# Patient Record
Sex: Female | Born: 1940
Health system: Southern US, Community
[De-identification: ages and names within clinical notes are randomized; demographics above are authoritative.]

## PROBLEM LIST (undated history)

## (undated) DIAGNOSIS — I10 Essential (primary) hypertension: Secondary | ICD-10-CM

## (undated) DIAGNOSIS — E785 Hyperlipidemia, unspecified: Secondary | ICD-10-CM

## (undated) DIAGNOSIS — M1990 Unspecified osteoarthritis, unspecified site: Secondary | ICD-10-CM

## (undated) DIAGNOSIS — J45909 Unspecified asthma, uncomplicated: Secondary | ICD-10-CM

## (undated) DIAGNOSIS — K08109 Complete loss of teeth, unspecified cause, unspecified class: Secondary | ICD-10-CM

## (undated) DIAGNOSIS — Z972 Presence of dental prosthetic device (complete) (partial): Secondary | ICD-10-CM

## (undated) DIAGNOSIS — Z973 Presence of spectacles and contact lenses: Secondary | ICD-10-CM

## (undated) DIAGNOSIS — T4145XA Adverse effect of unspecified anesthetic, initial encounter: Secondary | ICD-10-CM

## (undated) DIAGNOSIS — K589 Irritable bowel syndrome without diarrhea: Secondary | ICD-10-CM

## (undated) DIAGNOSIS — I2510 Atherosclerotic heart disease of native coronary artery without angina pectoris: Secondary | ICD-10-CM

## (undated) DIAGNOSIS — K5792 Diverticulitis of intestine, part unspecified, without perforation or abscess without bleeding: Secondary | ICD-10-CM

## (undated) DIAGNOSIS — K219 Gastro-esophageal reflux disease without esophagitis: Secondary | ICD-10-CM

## (undated) DIAGNOSIS — M8580 Other specified disorders of bone density and structure, unspecified site: Secondary | ICD-10-CM

## (undated) DIAGNOSIS — T8859XA Other complications of anesthesia, initial encounter: Secondary | ICD-10-CM

## (undated) DIAGNOSIS — F419 Anxiety disorder, unspecified: Secondary | ICD-10-CM

## (undated) HISTORY — DX: Gastro-esophageal reflux disease without esophagitis: K21.9

## (undated) HISTORY — PX: CHOLECYSTECTOMY: SHX55

## (undated) HISTORY — PX: EYE SURGERY: SHX253

## (undated) HISTORY — PX: ABDOMINAL HYSTERECTOMY: SHX81

## (undated) HISTORY — PX: CARPAL TUNNEL RELEASE: SHX101

## (undated) HISTORY — DX: Unspecified osteoarthritis, unspecified site: M19.90

## (undated) HISTORY — DX: Other specified disorders of bone density and structure, unspecified site: M85.80

## (undated) HISTORY — DX: Hyperlipidemia, unspecified: E78.5

## (undated) HISTORY — PX: APPENDECTOMY: SHX54

## (undated) HISTORY — DX: Anxiety disorder, unspecified: F41.9

## (undated) HISTORY — DX: Diverticulitis of intestine, part unspecified, without perforation or abscess without bleeding: K57.92

## (undated) HISTORY — PX: COLONOSCOPY: SHX174

## (undated) HISTORY — DX: Atherosclerotic heart disease of native coronary artery without angina pectoris: I25.10

---

## 1988-08-20 HISTORY — PX: KNEE ARTHROSCOPY: SUR90

## 1998-08-20 HISTORY — PX: JOINT REPLACEMENT: SHX530

## 1998-12-14 ENCOUNTER — Other Ambulatory Visit: Admission: RE | Admit: 1998-12-14 | Discharge: 1998-12-14 | Payer: Self-pay

## 1999-05-10 ENCOUNTER — Encounter: Payer: Self-pay | Admitting: Orthopedic Surgery

## 1999-05-17 ENCOUNTER — Inpatient Hospital Stay (HOSPITAL_COMMUNITY): Admission: RE | Admit: 1999-05-17 | Discharge: 1999-05-22 | Payer: Self-pay | Admitting: Orthopedic Surgery

## 2002-04-03 ENCOUNTER — Encounter: Payer: Self-pay | Admitting: Family Medicine

## 2002-04-03 ENCOUNTER — Ambulatory Visit (HOSPITAL_COMMUNITY): Admission: RE | Admit: 2002-04-03 | Discharge: 2002-04-03 | Payer: Self-pay | Admitting: Family Medicine

## 2002-04-08 ENCOUNTER — Encounter: Payer: Self-pay | Admitting: Family Medicine

## 2002-04-08 ENCOUNTER — Ambulatory Visit (HOSPITAL_COMMUNITY): Admission: RE | Admit: 2002-04-08 | Discharge: 2002-04-08 | Payer: Self-pay | Admitting: *Deleted

## 2002-04-09 ENCOUNTER — Observation Stay (HOSPITAL_COMMUNITY): Admission: RE | Admit: 2002-04-09 | Discharge: 2002-04-10 | Payer: Self-pay | Admitting: General Surgery

## 2002-04-09 ENCOUNTER — Encounter: Payer: Self-pay | Admitting: General Surgery

## 2002-04-09 ENCOUNTER — Encounter (INDEPENDENT_AMBULATORY_CARE_PROVIDER_SITE_OTHER): Payer: Self-pay | Admitting: Specialist

## 2002-04-30 ENCOUNTER — Ambulatory Visit (HOSPITAL_COMMUNITY): Admission: RE | Admit: 2002-04-30 | Discharge: 2002-04-30 | Payer: Self-pay | Admitting: General Surgery

## 2002-04-30 ENCOUNTER — Encounter: Payer: Self-pay | Admitting: General Surgery

## 2003-07-06 ENCOUNTER — Other Ambulatory Visit: Admission: RE | Admit: 2003-07-06 | Discharge: 2003-07-06 | Payer: Self-pay | Admitting: Family Medicine

## 2003-07-06 ENCOUNTER — Encounter: Payer: Self-pay | Admitting: Family Medicine

## 2003-07-06 LAB — CONVERTED CEMR LAB: Pap Smear: NORMAL

## 2003-07-21 ENCOUNTER — Encounter: Admission: RE | Admit: 2003-07-21 | Discharge: 2003-07-21 | Payer: Self-pay | Admitting: Family Medicine

## 2004-03-16 ENCOUNTER — Encounter: Admission: RE | Admit: 2004-03-16 | Discharge: 2004-03-16 | Payer: Self-pay | Admitting: Family Medicine

## 2004-04-11 ENCOUNTER — Encounter: Admission: RE | Admit: 2004-04-11 | Discharge: 2004-04-11 | Payer: Self-pay | Admitting: Family Medicine

## 2004-07-19 ENCOUNTER — Ambulatory Visit: Payer: Self-pay | Admitting: Family Medicine

## 2004-08-09 ENCOUNTER — Ambulatory Visit: Payer: Self-pay | Admitting: Family Medicine

## 2004-09-07 ENCOUNTER — Ambulatory Visit: Payer: Self-pay | Admitting: Family Medicine

## 2004-09-13 ENCOUNTER — Ambulatory Visit: Payer: Self-pay | Admitting: Family Medicine

## 2004-09-20 LAB — HM DEXA SCAN

## 2004-09-26 ENCOUNTER — Ambulatory Visit: Payer: Self-pay | Admitting: Family Medicine

## 2004-10-02 ENCOUNTER — Encounter: Admission: RE | Admit: 2004-10-02 | Discharge: 2004-10-02 | Payer: Self-pay | Admitting: Family Medicine

## 2004-11-01 ENCOUNTER — Ambulatory Visit: Payer: Self-pay | Admitting: Family Medicine

## 2004-12-25 ENCOUNTER — Ambulatory Visit: Payer: Self-pay | Admitting: Family Medicine

## 2005-03-28 ENCOUNTER — Ambulatory Visit: Payer: Self-pay | Admitting: Family Medicine

## 2005-05-07 ENCOUNTER — Ambulatory Visit: Payer: Self-pay | Admitting: Family Medicine

## 2005-06-05 ENCOUNTER — Ambulatory Visit: Payer: Self-pay | Admitting: Family Medicine

## 2005-06-28 ENCOUNTER — Ambulatory Visit: Payer: Self-pay | Admitting: Family Medicine

## 2005-08-29 ENCOUNTER — Ambulatory Visit: Payer: Self-pay | Admitting: Family Medicine

## 2005-09-20 ENCOUNTER — Ambulatory Visit: Payer: Self-pay | Admitting: Family Medicine

## 2005-10-04 ENCOUNTER — Encounter: Admission: RE | Admit: 2005-10-04 | Discharge: 2005-10-04 | Payer: Self-pay | Admitting: Family Medicine

## 2005-11-06 ENCOUNTER — Ambulatory Visit: Payer: Self-pay | Admitting: Family Medicine

## 2006-02-12 ENCOUNTER — Ambulatory Visit: Payer: Self-pay | Admitting: Family Medicine

## 2006-07-09 ENCOUNTER — Ambulatory Visit: Payer: Self-pay | Admitting: Family Medicine

## 2006-07-31 ENCOUNTER — Ambulatory Visit: Payer: Self-pay | Admitting: Family Medicine

## 2006-09-13 ENCOUNTER — Ambulatory Visit: Payer: Self-pay | Admitting: Family Medicine

## 2006-09-13 LAB — CONVERTED CEMR LAB
Basophils Relative: 1 % (ref 0.0–1.0)
CO2: 30 meq/L (ref 19–32)
Calcium: 9.6 mg/dL (ref 8.4–10.5)
Chloride: 105 meq/L (ref 96–112)
Cholesterol: 209 mg/dL (ref 0–200)
Creatinine, Ser: 0.8 mg/dL (ref 0.4–1.2)
Direct LDL: 130.2 mg/dL
Eosinophils Absolute: 0.3 10*3/uL (ref 0.0–0.6)
Eosinophils Relative: 3.2 % (ref 0.0–5.0)
GFR calc Af Amer: 93 mL/min
GFR calc non Af Amer: 77 mL/min
HCT: 44.8 % (ref 36.0–46.0)
Hgb A1c MFr Bld: 6.4 % — ABNORMAL HIGH (ref 4.6–6.0)
Lymphocytes Relative: 21.4 % (ref 12.0–46.0)
MCV: 91.6 fL (ref 78.0–100.0)
Monocytes Relative: 6.1 % (ref 3.0–11.0)
Neutro Abs: 6.3 10*3/uL (ref 1.4–7.7)
Phosphorus: 3.8 mg/dL (ref 2.3–4.6)
Total CHOL/HDL Ratio: 5.9
Triglycerides: 264 mg/dL (ref 0–149)
VLDL: 53 mg/dL — ABNORMAL HIGH (ref 0–40)

## 2006-10-09 ENCOUNTER — Ambulatory Visit: Payer: Self-pay | Admitting: Family Medicine

## 2006-10-23 ENCOUNTER — Ambulatory Visit: Payer: Self-pay | Admitting: Family Medicine

## 2006-11-12 ENCOUNTER — Encounter: Admission: RE | Admit: 2006-11-12 | Discharge: 2006-11-12 | Payer: Self-pay | Admitting: Family Medicine

## 2006-11-19 LAB — HM COLONOSCOPY

## 2006-12-02 ENCOUNTER — Ambulatory Visit: Payer: Self-pay | Admitting: Internal Medicine

## 2006-12-12 ENCOUNTER — Ambulatory Visit: Payer: Self-pay | Admitting: Internal Medicine

## 2007-01-31 ENCOUNTER — Encounter: Payer: Self-pay | Admitting: Family Medicine

## 2007-01-31 DIAGNOSIS — F419 Anxiety disorder, unspecified: Secondary | ICD-10-CM

## 2007-01-31 DIAGNOSIS — Z8719 Personal history of other diseases of the digestive system: Secondary | ICD-10-CM

## 2007-01-31 DIAGNOSIS — E785 Hyperlipidemia, unspecified: Secondary | ICD-10-CM

## 2007-01-31 DIAGNOSIS — M1990 Unspecified osteoarthritis, unspecified site: Secondary | ICD-10-CM | POA: Insufficient documentation

## 2007-02-05 ENCOUNTER — Ambulatory Visit: Payer: Self-pay | Admitting: Family Medicine

## 2007-02-05 DIAGNOSIS — Z87891 Personal history of nicotine dependence: Secondary | ICD-10-CM

## 2007-02-05 DIAGNOSIS — I10 Essential (primary) hypertension: Secondary | ICD-10-CM

## 2007-02-05 LAB — CONVERTED CEMR LAB
Bilirubin Urine: NEGATIVE
Ketones, urine, test strip: NEGATIVE
Nitrite: NEGATIVE
Specific Gravity, Urine: 1.015
Urobilinogen, UA: 0.2

## 2007-02-07 LAB — CONVERTED CEMR LAB
CO2: 30 meq/L (ref 19–32)
Cholesterol: 224 mg/dL (ref 0–200)
Creatinine, Ser: 0.8 mg/dL (ref 0.4–1.2)
GFR calc Af Amer: 93 mL/min
GFR calc non Af Amer: 77 mL/min
Glucose, Bld: 137 mg/dL — ABNORMAL HIGH (ref 70–99)
HDL: 32.4 mg/dL — ABNORMAL LOW (ref >39.0)
Sodium: 143 meq/L (ref 135–145)
Total CHOL/HDL Ratio: 6.9
Triglycerides: 271 mg/dL (ref 0–149)
VLDL: 54 mg/dL — ABNORMAL HIGH (ref 0–40)

## 2007-02-24 ENCOUNTER — Telehealth: Payer: Self-pay | Admitting: Family Medicine

## 2007-07-18 ENCOUNTER — Ambulatory Visit: Payer: Self-pay | Admitting: Family Medicine

## 2007-09-18 ENCOUNTER — Ambulatory Visit: Payer: Self-pay | Admitting: Family Medicine

## 2007-09-18 LAB — CONVERTED CEMR LAB
Glucose, Urine, Semiquant: NEGATIVE
Nitrite: NEGATIVE
Protein, U semiquant: NEGATIVE

## 2007-09-19 ENCOUNTER — Encounter: Payer: Self-pay | Admitting: Family Medicine

## 2007-10-03 ENCOUNTER — Ambulatory Visit: Payer: Self-pay | Admitting: Family Medicine

## 2007-10-06 LAB — CONVERTED CEMR LAB
Albumin: 3.8 g/dL (ref 3.5–5.2)
Chloride: 103 meq/L (ref 96–112)
Creatinine, Ser: 0.8 mg/dL (ref 0.4–1.2)
Direct LDL: 116 mg/dL
GFR calc non Af Amer: 76 mL/min
HDL: 32.8 mg/dL — ABNORMAL LOW (ref >39.0)
Hgb A1c MFr Bld: 6.9 % — ABNORMAL HIGH (ref 4.6–6.0)
Phosphorus: 3.8 mg/dL (ref 2.3–4.6)
Potassium: 3.9 meq/L (ref 3.5–5.1)
Sodium: 140 meq/L (ref 135–145)
Total CHOL/HDL Ratio: 6.2
Triglycerides: 296 mg/dL (ref 0–149)

## 2007-12-05 ENCOUNTER — Ambulatory Visit: Payer: Self-pay | Admitting: Family Medicine

## 2007-12-05 DIAGNOSIS — J309 Allergic rhinitis, unspecified: Secondary | ICD-10-CM

## 2008-04-20 ENCOUNTER — Ambulatory Visit: Payer: Self-pay | Admitting: Family Medicine

## 2008-04-23 LAB — CONVERTED CEMR LAB
ALT: 29 units/L (ref 0–35)
Calcium: 9.2 mg/dL (ref 8.4–10.5)
Cholesterol: 228 mg/dL (ref 0–200)
Creatinine, Ser: 0.7 mg/dL (ref 0.4–1.2)
GFR calc Af Amer: 108 mL/min
GFR calc non Af Amer: 89 mL/min
HDL: 30.2 mg/dL — ABNORMAL LOW (ref >39.0)
Triglycerides: 222 mg/dL (ref 0–149)
VLDL: 44 mg/dL — ABNORMAL HIGH (ref 0–40)

## 2008-05-04 ENCOUNTER — Ambulatory Visit: Payer: Self-pay | Admitting: Family Medicine

## 2008-05-05 LAB — CONVERTED CEMR LAB
Eosinophils Absolute: 0.1 10*3/uL (ref 0.0–0.7)
HCT: 44.9 % (ref 36.0–46.0)
Lymphocytes Relative: 14.7 % (ref 12.0–46.0)
MCV: 91.3 fL (ref 78.0–100.0)
Monocytes Absolute: 0.5 10*3/uL (ref 0.1–1.0)
Monocytes Relative: 5.3 % (ref 3.0–12.0)
Platelets: 245 10*3/uL (ref 150–400)
RDW: 12.1 % (ref 11.5–14.6)

## 2008-05-07 ENCOUNTER — Ambulatory Visit: Payer: Self-pay | Admitting: Family Medicine

## 2008-06-21 ENCOUNTER — Ambulatory Visit: Payer: Self-pay | Admitting: Family Medicine

## 2008-06-24 ENCOUNTER — Encounter: Payer: Self-pay | Admitting: Family Medicine

## 2008-06-29 ENCOUNTER — Telehealth: Payer: Self-pay | Admitting: Family Medicine

## 2008-08-06 ENCOUNTER — Ambulatory Visit: Payer: Self-pay | Admitting: Internal Medicine

## 2008-08-06 ENCOUNTER — Ambulatory Visit: Payer: Self-pay | Admitting: Family Medicine

## 2008-08-06 LAB — CONVERTED CEMR LAB
Bilirubin Urine: NEGATIVE
Epithelial cells, urine: 0 /lpf
Nitrite: NEGATIVE
Urobilinogen, UA: 0.2

## 2008-08-10 LAB — CONVERTED CEMR LAB
Albumin: 3.8 g/dL (ref 3.5–5.2)
CO2: 30 meq/L (ref 19–32)
Chloride: 103 meq/L (ref 96–112)
Creatinine, Ser: 0.8 mg/dL (ref 0.4–1.2)
GFR calc non Af Amer: 76 mL/min
Glucose, Bld: 171 mg/dL — ABNORMAL HIGH (ref 70–99)
Potassium: 3.8 meq/L (ref 3.5–5.1)
Sodium: 139 meq/L (ref 135–145)

## 2008-08-24 ENCOUNTER — Ambulatory Visit: Payer: Self-pay | Admitting: Family Medicine

## 2008-08-24 LAB — CONVERTED CEMR LAB
Glucose, Urine, Semiquant: NEGATIVE
Ketones, urine, test strip: NEGATIVE
Nitrite: NEGATIVE
Protein, U semiquant: NEGATIVE
Specific Gravity, Urine: 1.01
Urobilinogen, UA: 0.2
Yeast, UA: 0

## 2008-08-25 ENCOUNTER — Encounter: Payer: Self-pay | Admitting: Family Medicine

## 2008-09-07 ENCOUNTER — Ambulatory Visit: Payer: Self-pay | Admitting: Family Medicine

## 2008-09-07 LAB — CONVERTED CEMR LAB
Bilirubin Urine: NEGATIVE
Casts: 0 /lpf
Glucose, Urine, Semiquant: NEGATIVE
Ketones, urine, test strip: NEGATIVE
Protein, U semiquant: NEGATIVE
WBC, UA: 1 cells/hpf
Yeast, UA: 0

## 2008-09-08 ENCOUNTER — Encounter: Payer: Self-pay | Admitting: Family Medicine

## 2008-09-08 ENCOUNTER — Ambulatory Visit: Payer: Self-pay | Admitting: Internal Medicine

## 2008-09-09 ENCOUNTER — Encounter: Payer: Self-pay | Admitting: Family Medicine

## 2008-09-10 ENCOUNTER — Telehealth: Payer: Self-pay | Admitting: Family Medicine

## 2008-09-10 ENCOUNTER — Encounter: Payer: Self-pay | Admitting: Family Medicine

## 2008-09-17 ENCOUNTER — Encounter: Payer: Self-pay | Admitting: Family Medicine

## 2008-12-30 ENCOUNTER — Ambulatory Visit: Payer: Self-pay | Admitting: Family Medicine

## 2009-01-02 LAB — CONVERTED CEMR LAB
AST: 30 units/L (ref 0–37)
BUN: 9 mg/dL (ref 6–23)
CO2: 30 meq/L (ref 19–32)
Cholesterol: 205 mg/dL — ABNORMAL HIGH (ref 0–200)
Creatinine, Ser: 0.8 mg/dL (ref 0.4–1.2)
Creatinine,U: 165.5 mg/dL
Glucose, Bld: 103 mg/dL — ABNORMAL HIGH (ref 70–99)
HDL: 31.8 mg/dL — ABNORMAL LOW (ref >39.00)
Hgb A1c MFr Bld: 6.2 % (ref 4.6–6.5)
Microalb Creat Ratio: 4.2 mg/g (ref 0.0–30.0)
Microalb, Ur: 0.7 mg/dL (ref 0.0–1.9)
Phosphorus: 3.7 mg/dL (ref 2.3–4.6)
Potassium: 3.7 meq/L (ref 3.5–5.1)

## 2009-01-06 ENCOUNTER — Ambulatory Visit: Payer: Self-pay | Admitting: Family Medicine

## 2009-01-19 ENCOUNTER — Telehealth: Payer: Self-pay | Admitting: Family Medicine

## 2009-03-09 ENCOUNTER — Ambulatory Visit: Payer: Self-pay | Admitting: Family Medicine

## 2009-03-14 LAB — CONVERTED CEMR LAB
HDL: 34.5 mg/dL — ABNORMAL LOW (ref >39.00)
Total CHOL/HDL Ratio: 6

## 2009-06-17 ENCOUNTER — Ambulatory Visit: Payer: Self-pay | Admitting: Family Medicine

## 2009-06-20 LAB — CONVERTED CEMR LAB
Glucose, Bld: 96 mg/dL (ref 70–99)
HDL: 34.7 mg/dL — ABNORMAL LOW (ref >39.00)
Total CHOL/HDL Ratio: 6
Triglycerides: 179 mg/dL — ABNORMAL HIGH (ref 0.0–149.0)
VLDL: 35.8 mg/dL (ref 0.0–40.0)

## 2009-06-22 ENCOUNTER — Ambulatory Visit: Payer: Self-pay | Admitting: Family Medicine

## 2009-07-12 ENCOUNTER — Telehealth: Payer: Self-pay | Admitting: Family Medicine

## 2009-07-25 ENCOUNTER — Encounter: Payer: Self-pay | Admitting: Family Medicine

## 2009-07-26 ENCOUNTER — Ambulatory Visit: Payer: Self-pay | Admitting: Family Medicine

## 2009-07-28 LAB — CONVERTED CEMR LAB
ALT: 29 units/L (ref 0–35)
Direct LDL: 143.9 mg/dL
HDL: 32.1 mg/dL — ABNORMAL LOW (ref >39.00)
Total CHOL/HDL Ratio: 7
Triglycerides: 231 mg/dL — ABNORMAL HIGH (ref 0.0–149.0)

## 2009-09-05 ENCOUNTER — Ambulatory Visit: Payer: Self-pay | Admitting: Family Medicine

## 2009-09-06 ENCOUNTER — Telehealth: Payer: Self-pay | Admitting: Family Medicine

## 2009-10-07 ENCOUNTER — Telehealth: Payer: Self-pay | Admitting: Family Medicine

## 2009-10-07 ENCOUNTER — Encounter: Payer: Self-pay | Admitting: Family Medicine

## 2009-10-07 LAB — CONVERTED CEMR LAB
Bacteria, UA: 0
Glucose, Urine, Semiquant: NEGATIVE
Ketones, urine, test strip: NEGATIVE
Specific Gravity, Urine: 1.01
Urobilinogen, UA: 0.2
WBC Urine, dipstick: NEGATIVE
Yeast, UA: 0
pH: 6.5

## 2009-10-08 ENCOUNTER — Encounter: Payer: Self-pay | Admitting: Family Medicine

## 2009-10-11 ENCOUNTER — Ambulatory Visit: Payer: Self-pay | Admitting: Family Medicine

## 2009-11-15 ENCOUNTER — Observation Stay (HOSPITAL_COMMUNITY): Admission: EM | Admit: 2009-11-15 | Discharge: 2009-11-17 | Payer: Self-pay | Admitting: Emergency Medicine

## 2009-11-15 ENCOUNTER — Ambulatory Visit: Payer: Self-pay | Admitting: Cardiovascular Disease

## 2009-11-15 ENCOUNTER — Encounter: Payer: Self-pay | Admitting: Family Medicine

## 2009-11-15 ENCOUNTER — Telehealth: Payer: Self-pay | Admitting: Family Medicine

## 2009-11-16 ENCOUNTER — Encounter: Payer: Self-pay | Admitting: Family Medicine

## 2009-11-16 ENCOUNTER — Encounter: Payer: Self-pay | Admitting: Cardiovascular Disease

## 2009-11-17 ENCOUNTER — Encounter: Payer: Self-pay | Admitting: Family Medicine

## 2009-11-23 ENCOUNTER — Ambulatory Visit: Payer: Self-pay | Admitting: Family Medicine

## 2010-01-25 ENCOUNTER — Ambulatory Visit: Payer: Self-pay | Admitting: Family Medicine

## 2010-01-25 LAB — CONVERTED CEMR LAB: LDL Goal: 100 mg/dL

## 2010-01-26 LAB — CONVERTED CEMR LAB
ALT: 22 units/L (ref 0–35)
BUN: 12 mg/dL (ref 6–23)
Cholesterol: 231 mg/dL — ABNORMAL HIGH (ref 0–200)
Direct LDL: 152.7 mg/dL
Glucose, Bld: 106 mg/dL — ABNORMAL HIGH (ref 70–99)
HDL: 39.3 mg/dL (ref >39.00)
Phosphorus: 3.6 mg/dL (ref 2.3–4.6)
Potassium: 4.1 meq/L (ref 3.5–5.1)
Sodium: 141 meq/L (ref 135–145)

## 2010-05-05 ENCOUNTER — Telehealth: Payer: Self-pay | Admitting: Family Medicine

## 2010-05-09 ENCOUNTER — Ambulatory Visit: Payer: Self-pay | Admitting: Internal Medicine

## 2010-05-11 ENCOUNTER — Telehealth: Payer: Self-pay | Admitting: Family Medicine

## 2010-05-19 ENCOUNTER — Ambulatory Visit: Payer: Self-pay | Admitting: Family Medicine

## 2010-06-06 ENCOUNTER — Ambulatory Visit: Payer: Self-pay | Admitting: Family Medicine

## 2010-06-07 ENCOUNTER — Ambulatory Visit: Payer: Self-pay | Admitting: Family Medicine

## 2010-07-11 LAB — HM DIABETES EYE EXAM: HM Diabetic Eye Exam: NORMAL

## 2010-07-18 ENCOUNTER — Encounter: Payer: Self-pay | Admitting: Family Medicine

## 2010-07-25 ENCOUNTER — Telehealth: Payer: Self-pay | Admitting: Family Medicine

## 2010-07-27 ENCOUNTER — Ambulatory Visit: Payer: Self-pay | Admitting: Family Medicine

## 2010-07-30 LAB — CONVERTED CEMR LAB
ALT: 29 units/L (ref 0–35)
AST: 21 units/L (ref 0–37)
Albumin: 4.2 g/dL (ref 3.5–5.2)
BUN: 16 mg/dL (ref 6–23)
CO2: 28 meq/L (ref 19–32)
Chloride: 103 meq/L (ref 96–112)
Creatinine, Ser: 0.9 mg/dL (ref 0.4–1.2)
GFR calc non Af Amer: 62.71 mL/min (ref >60.00)
HDL: 39.3 mg/dL (ref >39.00)
Hgb A1c MFr Bld: 6.2 % (ref 4.6–6.5)
Microalb Creat Ratio: 0.4 mg/g (ref 0.0–30.0)
VLDL: 37 mg/dL (ref 0.0–40.0)

## 2010-07-31 ENCOUNTER — Ambulatory Visit: Payer: Self-pay | Admitting: Family Medicine

## 2010-07-31 DIAGNOSIS — K219 Gastro-esophageal reflux disease without esophagitis: Secondary | ICD-10-CM

## 2010-08-24 ENCOUNTER — Ambulatory Visit
Admission: RE | Admit: 2010-08-24 | Discharge: 2010-08-24 | Payer: Self-pay | Source: Home / Self Care | Attending: Family Medicine | Admitting: Family Medicine

## 2010-08-24 DIAGNOSIS — H6690 Otitis media, unspecified, unspecified ear: Secondary | ICD-10-CM | POA: Insufficient documentation

## 2010-09-19 NOTE — Miscellaneous (Signed)
Summary: Controlled Substance Agreement  Controlled Substance Agreement   Imported By: Lanelle Bal 02/01/2010 09:32:49  _____________________________________________________________________  External Attachment:    Type:   Image     Comment:   External Document

## 2010-09-19 NOTE — Assessment & Plan Note (Signed)
Summary: ear hurts,sore throat,coughing,congestion   Vital Signs:  Patient profile:   70 year old female Height:      61 inches Weight:      163 pounds BMI:     30.91 Temp:     97.7 degrees F oral Pulse rate:   88 / minute Pulse rhythm:   regular BP sitting:   118 / 60  (left arm) Cuff size:   regular  Vitals Entered By: Delilah Shan CMA Duncan Dull) (September 05, 2009 3:31 PM) CC: Ear hurts, ST, cough, congestion   History of Present Illness: 70 yo with almost 2 week of worsening nasal congestion, productive cough, ear pain/popping. No wheezing, CP, or SOB. Mild scratchy throat. This morning, had a terrible frontal headache. No fevers, no chills. No rash. No n/v/d.  Current Medications (verified): 1)  Hydrochlorothiazide 25 Mg  Tabs (Hydrochlorothiazide) .... Take One By Mouth Daily 2)  Calcium + D   Tabs (Calcium-Vitamin D Tabs) .... Take 600 Mg Two By Mouth Daily 3)  Zestril 10 Mg  Tabs (Lisinopril) .... Take One By Mouth Daily 4)  Adult Aspirin Low Strength 81 Mg  Tbdp (Aspirin) .... Take One By Mouth Daily 5)  Glucometer .... To Check Blood Sugar Once Daily and As Needed For Diabetes 250.0 6)  Freestyle Lite Test  Strp (Glucose Blood) .... Check Blood Sugar Once   Daily and As Needed 7)  Glipizide Xl 5 Mg Tb24 (Glipizide) .Marland Kitchen.. 1 Tablet By Mouth Daily 8)  Zocor 20 Mg Tabs (Simvastatin) .Marland Kitchen.. 1 By Mouth Once Daily in Evening 9)  Freestyle Lancets  Misc (Lancets) .... Check Blood Sugar Once Daily and As Needed. 10)  Azithromycin 250 Mg  Tabs (Azithromycin) .... 2 By  Mouth Today and Then 1 Daily For 4 Days  Allergies: 1)  ! Lipitor 2)  ! Zocor (Simvastatin) 3)  Sulfa 4)  Asa 5)  Metformin Hcl  Review of Systems      See HPI General:  Denies chills and fever. ENT:  Complains of earache, postnasal drainage, and sinus pressure; denies difficulty swallowing, ear discharge, and sore throat. Resp:  Complains of cough and sputum productive; denies shortness of breath and  wheezing.  Physical Exam  General:  overweight but generally well appearing  Nose:  mucosal erythema and mucosal edema.   TTP over frontal and maxillary sinuses bilaterally.  Mouth:  pharynx pink and moist.   Lungs:  diffusely distantant bs , without rales/ wheeze or crackles  Heart:  Normal rate and regular rhythm. S1 and S2 normal without gallop, murmur, click, rub or other extra sounds. Extremities:  No clubbing, cyanosis, edema, or deformity noted with normal full range of motion of all joints.   Psych:  normal affect, talkative and pleasant    Impression & Recommendations:  Problem # 1:  OTHER ACUTE SINUSITIS (ICD-461.8) Assessment New  Given duration of symptoms, will treat with abx. Continue supportive care. See patient instructions for details. Her updated medication list for this problem includes:    Azithromycin 250 Mg Tabs (Azithromycin) .Marland Kitchen... 2 by  mouth today and then 1 daily for 4 days  Her updated medication list for this problem includes:    Azithromycin 250 Mg Tabs (Azithromycin) .Marland Kitchen... 2 by  mouth today and then 1 daily for 4 days  Complete Medication List: 1)  Hydrochlorothiazide 25 Mg Tabs (Hydrochlorothiazide) .... Take one by mouth daily 2)  Calcium + D Tabs (Calcium-vitamin d tabs) .... Take 600 mg two by mouth  daily 3)  Zestril 10 Mg Tabs (Lisinopril) .... Take one by mouth daily 4)  Adult Aspirin Low Strength 81 Mg Tbdp (Aspirin) .... Take one by mouth daily 5)  Glucometer  .... To check blood sugar once daily and as needed for diabetes 250.0 6)  Freestyle Lite Test Strp (Glucose blood) .... Check blood sugar once   daily and as needed 7)  Glipizide Xl 5 Mg Tb24 (Glipizide) .Marland Kitchen.. 1 tablet by mouth daily 8)  Zocor 20 Mg Tabs (Simvastatin) .Marland Kitchen.. 1 by mouth once daily in evening 9)  Freestyle Lancets Misc (Lancets) .... Check blood sugar once daily and as needed. 10)  Azithromycin 250 Mg Tabs (Azithromycin) .... 2 by  mouth today and then 1 daily for 4  days  Patient Instructions: 1)  Take antibiotic as directed.  Drink lots of fluids.  Treat sympotmatically with Mucinex, nasal saline irrigation, and Tylenol/Ibuprofen. You can also try using warm compresses.  Cough suppressant at night. Call if not improving as expected in 5-7 days.  Prescriptions: AZITHROMYCIN 250 MG  TABS (AZITHROMYCIN) 2 by  mouth today and then 1 daily for 4 days  #6 x 0   Entered and Authorized by:   Ruthe Mannan MD   Signed by:   Ruthe Mannan MD on 09/05/2009   Method used:   [facility]        [id number]       [address]       [place], [place]  [address]       Ph: [phone]       Fax: [phone]   RxID:   [id number]   Current Allergies (reviewed today): ! LIPITOR ! ZOCOR (SIMVASTATIN) SULFA ASA METFORMIN HCL

## 2010-09-19 NOTE — Assessment & Plan Note (Signed)
Summary: SINUS INFECTION/DLO   Vital Signs:  Patient profile:   70 year old female Weight:      166.25 pounds O2 Sat:      96 % on Room air Temp:     98.2 degrees F oral Pulse rate:   88 / minute Pulse rhythm:   regular BP sitting:   132 / 70  (left arm) Cuff size:   regular  Vitals Entered By: Selena Batten Dance CMA (AAMA) (May 09, 2010 4:01 PM)  O2 Flow:  Room air CC: ? Sinus Infection   History of Present Illness: CC: sinuses, ears, throat, cough  1 wk h/o cough, sneezing, RN, now congestion.  Clear and thick.  cough is thick clear mucous.  + initially with fevers, but improved since beginning.  + ear pain, facial pressure.  Has tried mucinex.  Drinking plenty of fluid.  Gets infection every fall.  Smoking 1/2 ppd.    + sick contact- granddaughter with cold.    h/o DM.  No h/o allergies or asthma.  Husband about to undergo kidney biopsy next week, pt wants to feel better.  Current Medications (verified): 1)  Hydrochlorothiazide 25 Mg  Tabs (Hydrochlorothiazide) .... Take One By Mouth Daily 2)  Calcium + D   Tabs (Calcium-Vitamin D Tabs) .... Take 600 Mg Two By Mouth Daily 3)  Zestril 10 Mg  Tabs (Lisinopril) .... Take One By Mouth Daily 4)  Adult Aspirin Low Strength 81 Mg  Tbdp (Aspirin) .... Take One By Mouth Daily 5)  Glucometer .... To Check Blood Sugar Once Daily and As Needed For Diabetes 250.0 6)  Freestyle Lite Test  Strp (Glucose Blood) .... Check Blood Sugar Once   Daily and As Needed 7)  Glipizide Xl 5 Mg Tb24 (Glipizide) .Marland Kitchen.. 1 Tablet By Mouth Daily 8)  Freestyle Lancets  Misc (Lancets) .... Check Blood Sugar Once Daily and As Needed. 9)  Niacin 500 Mg Tabs (Niacin) .... Take One Tablet By Mouth Nightly 10)  Fish Oil   Oil (Fish Oil) .... Otc As Directed. 11)  Flax   Oil (Flaxseed (Linseed)) .... Otc As Directed. 12)  Alprazolam 0.25 Mg Tabs (Alprazolam) .... Take 1/2 Tablet At Bedtime As Needed 13)  Buspirone Hcl 15 Mg Tabs (Buspirone Hcl) .... 1/2 By Mouth  Two Times A Day For Anxiety  Allergies: 1)  ! Lipitor 2)  ! Zocor (Simvastatin) 3)  Sulfa 4)  Asa 5)  Metformin Hcl  Past History:  Past Medical History: Last updated: 06/29/2008 Anxiety Diabetes mellitus, type II Diverticulitis, hx of Hyperlipidemia Osteoarthritis smoking osteopenia   opthy-- Dr Archie Endo  Review of Systems       per HPI  Physical Exam  General:  overweight, tired appearing Head:  normocephalic, atraumatic, and no abnormalities observed.  Tenderness to palpation bilaterally frontal and maxillary Eyes:  vision grossly intact, pupils equal, pupils round, and pupils reactive to light.  no conjunctival pallor, injection or icterus  Ears:  External ear exam shows no significant lesions or deformities.  Otoscopic examination reveals clear canals, tympanic membranes are intact bilaterally without bulging, retraction, inflammation or discharge. Hearing is grossly normal bilaterally. Nose:  + purulent discharge in nares.   Mouth:  pharynx pink and moist.  no exudates.  + cobblestoning Neck:  supple with full rom and no masses or thyromegally, no JVD or carotid bruit  Lungs:  + coarse breath sounds, wheezing insp and exp throughout, good air movement though.  good pulse ox (96%).  no  crackles/rales Heart:  Normal rate and regular rhythm. S1 and S2 normal without gallop, murmur, click, rub or other extra sounds. Pulses:  2+ rad pulses Extremities:   no edema Skin:  Intact without suspicious lesions or rashes no jaundice or pallor    Impression & Recommendations:  Problem # 1:  VIRAL URI (ICD-465.9) Assessment Unchanged ? early COPD exac given lung findings, treat as such with steroids and abx.  strongly rec cut back on smoking.  red flags to return discussed.  continue mucinex, start nasal saline spray for congestion.  Discussed steroid precautions,  pt to keep eye on sugars given h/o DM.  on glipizide XL.  Her updated medication list for this problem includes:     Adult Aspirin Low Strength 81 Mg Tbdp (Aspirin) .Marland Kitchen... Take one by mouth daily  Complete Medication List: 1)  Hydrochlorothiazide 25 Mg Tabs (Hydrochlorothiazide) .... Take one by mouth daily 2)  Calcium + D Tabs (Calcium-vitamin d tabs) .... Take 600 mg two by mouth daily 3)  Zestril 10 Mg Tabs (Lisinopril) .... Take one by mouth daily 4)  Adult Aspirin Low Strength 81 Mg Tbdp (Aspirin) .... Take one by mouth daily 5)  Glucometer  .... To check blood sugar once daily and as needed for diabetes 250.0 6)  Freestyle Lite Test Strp (Glucose blood) .... Check blood sugar once   daily and as needed 7)  Glipizide Xl 5 Mg Tb24 (Glipizide) .Marland Kitchen.. 1 tablet by mouth daily 8)  Freestyle Lancets Misc (Lancets) .... Check blood sugar once daily and as needed. 9)  Niacin 500 Mg Tabs (Niacin) .... Take one tablet by mouth nightly 10)  Fish Oil Oil (Fish oil) .... Otc as directed. 11)  Flax Oil (Flaxseed (linseed)) .... Otc as directed. 12)  Alprazolam 0.25 Mg Tabs (Alprazolam) .... Take 1/2 tablet at bedtime as needed 13)  Buspirone Hcl 15 Mg Tabs (Buspirone hcl) .... 1/2 by mouth two times a day for anxiety 14)  Doxycycline Hyclate 100 Mg Caps (Doxycycline hyclate) .... Take one by mouth two times a day x 7 days 15)  Prednisone 20 Mg Tabs (Prednisone) .... Take two by mouth daily x 7days  Patient Instructions: 1)  I think you have a viral infection that has cause worsening of breathing.  You may have early COPD from smoking, rec strongly consider cutting back/quitting. 2)  Nasal saline for nose to help clear congestion. 3)  Mucinex with lots of fluids to help break up cough. 4)  Steroid course for lungs as well as antibiotic - watch sugars and energy level with steroids. 5)  Pleasure to meet you today, call clinic with questions. Prescriptions: PREDNISONE 20 MG TABS (PREDNISONE) take two by mouth daily x 7days  #14 x 0   Entered and Authorized by:   Eustaquio Boyden  MD   Signed by:   Eustaquio Boyden   MD on 05/09/2010   Method used:   [facility]        [phone][facility]       [address]       [place], [place]  [address]       Ph: [phone]       Fax: [phone]   RxID:   [id number] DOXYCYCLINE HYCLATE 100 MG CAPS (DOXYCYCLINE HYCLATE) take one by mouth two times a day x 7 days  #14 x 0   Entered and Authorized by:   Wynona Canes  Sharen Hones  MD   Signed by:   Eustaquio Boyden  MD on 05/09/2010   Method used:   [facility]        [facility] [address] [id number]       [address]       [place], [place]  [address]       Ph: [phone]       Fax: [phone]   RxID:   [id number]   Current Allergies (reviewed today): ! LIPITOR ! ZOCOR (SIMVASTATIN) SULFA ASA METFORMIN HCL

## 2010-09-19 NOTE — Assessment & Plan Note (Signed)
Summary: HOSPITAL FOLLOW UP/ALC   Vital Signs:  Patient profile:   70 year old female Height:      61 inches Weight:      166.25 pounds BMI:     31.53 Temp:     97.8 degrees F oral Pulse rate:   80 / minute Pulse rhythm:   regular BP sitting:   114 / 66  (left arm) Cuff size:   regular  Vitals Entered By: Lewanda Rife LPN (November 24, 8467 12:37 PM) CC: 30 min hospital follow up   History of Present Illness: was hosp with some atypical chest pain  was really stressed out and started having stabbing chest and arm and back pain  could not get comfortable and got worse and worse   cxr/ nuc stress test and CT angio all  nl  saw Dr Excell Seltzer dx with likely MS etiol cp -- ? if anx playing role  LDL chol was 132  started on asa  has cut her smoking in 1/2 and only smoking part of the cigarettes   given generic of xanax -- and that helps a bit -- takes 1/2 of the .25 (anx is worse at night)  wants something to take regularly for stress   is stressed out about everything  her husb is not very well - had 2nd tx of prostate cancer / first did not get it all / ? prognosis  2 of his sisters have breast cancer  another of his sisters has been dx with terminal pancreatic cancer  is very worried about these things  is looking after her grandchild - which is good and bad  does not like his son's new live in girlfriend (with a bad child)   talks to a few friends - good support and also her husb  does not want to see a counselor     Allergies: 1)  ! Lipitor 2)  ! Zocor (Simvastatin) 3)  Sulfa 4)  Asa 5)  Metformin Hcl  Past History:  Past Medical History: Last updated: 06/29/2008 Anxiety Diabetes mellitus, type II Diverticulitis, hx of Hyperlipidemia Osteoarthritis smoking osteopenia   opthy-- Dr Archie Endo  Family History: Last updated: 09/07/2008 Father: DM Mother:  Siblings:  brother DM  aunt- ? kidney cancer   Social History: Last updated: 06/21/2008 Marital  Status: Married Children:  Occupation:  smoker current   Risk Factors: Smoking Status: current (01/31/2007)  Past Surgical History: Cholecystectomy Hysterectomy Total knee replacement knee arthroscopy- chondromalacia (1990) BE- neg (1984) TMST- neg (1983) Colonoscop divertic, polyps (08/04/2003) Dexa- osteopenia (09/2004). Colonoscopy- tics, hemorrhoids (11/2006) hosp 4/11 for aypical chest pain/ chest wall pain  4/11 neg nuclear cardiac study  4/11 nl CT of chest   Review of Systems General:  Complains of fatigue; denies chills, fever, loss of appetite, and malaise. Eyes:  Denies blurring, eye pain, and itching. ENT:  Denies postnasal drainage and sinus pressure. CV:  Denies chest pain or discomfort, fainting, and palpitations. Resp:  Denies cough, shortness of breath, and wheezing. GI:  Denies dark tarry stools, diarrhea, nausea, and vomiting. GU:  Denies dysuria and urinary frequency. MS:  Denies joint pain, muscle aches, and muscle weakness. Derm:  Denies itching, lesion(s), poor wound healing, and rash. Neuro:  Complains of difficulty with concentration; denies headaches, numbness, and tingling. Psych:  Complains of anxiety, easily tearful, irritability, and panic attacks; denies sense of great danger and suicidal thoughts/plans. Endo:  Denies cold intolerance, excessive thirst, excessive urination, and heat intolerance. Heme:  Denies abnormal bruising and bleeding.  Physical Exam  General:  overweight but generally well appearing  Head:  normocephalic, atraumatic, and no abnormalities observed.   Eyes:  vision grossly intact, pupils equal, pupils round, and pupils reactive to light.  no conjunctival pallor, injection or icterus   Nose:  no nasal discharge.   Mouth:  pharynx pink and moist.   Neck:  supple with full rom and no masses or thyromegally, no JVD or carotid bruit  Chest Wall:  mild tenderness sternal and L ant no skin change or crepitice  Lungs:  Normal  respiratory effort, chest expands symmetrically. Lungs are clear to auscultation, no crackles or wheezes. Heart:  Normal rate and regular rhythm. S1 and S2 normal without gallop, murmur, click, rub or other extra sounds. Abdomen:  Bowel sounds positive,abdomen soft and non-tender without masses, organomegaly or hernias noted. no renal bruits  Msk:  No deformity or scoliosis noted of thoracic or lumbar spine.  no acute joint changes  Pulses:  R and L carotid,radial,femoral,dorsalis pedis and posterior tibial pulses are full and equal bilaterally Extremities:  No clubbing, cyanosis, edema, or deformity noted with normal full range of motion of all joints.   Neurologic:  sensation intact to light touch, gait normal, and DTRs symmetrical and normal.  no tremor  Skin:  Intact without suspicious lesions or rashes no jaundice or pallor  Cervical Nodes:  No lymphadenopathy noted Inguinal Nodes:  No significant adenopathy Psych:  normal affect, talkative and pleasant    Impression & Recommendations:  Problem # 1:  CHEST WALL PAIN, ACUTE (EAV-409.81) Assessment New atypical/ musculoskeletal  and worsened by anx rev hosp labs/ notes/ studies in detail- all re- assuring that it is non cardiac still some tenderness  recommended analgesic and warm compres if symptoms return  will tx anxiety Her updated medication list for this problem includes:    Adult Aspirin Low Strength 81 Mg Tbdp (Aspirin) .Marland Kitchen... Take one by mouth daily  Problem # 2:  ANXIETY (ICD-300.00) Assessment: Deteriorated worse with situational stress disc sit stress/coping skills/ support sources/ symptoms/ opt for tx and side eff in detail def counseling- has good support  will try buspar low dose two times a day - call if side eff/ warnings given xanax only if needed  spent 25 minutes face to face with pt - over 50% of that spent on counseling and coordination of care  f/u 4-6 wk Her updated medication list for this problem  includes:    Alprazolam 0.25 Mg Tabs (Alprazolam) .Marland Kitchen... Take 1/2 tablet at bedtime as needed    Buspirone Hcl 15 Mg Tabs (Buspirone hcl) .Marland Kitchen... 1/2 by mouth two times a day for anxiety  Problem # 3:  DISORDER, TOBACCO USE (ICD-305.1) Assessment: Unchanged discussed in detail risks of smoking, and possible outcomes including COPD, vascular dz, cancer and also respiratory infections/sinus problems   pt voiced understanding and is motivated to quit  commended on cutting down so far enc making quit date   Complete Medication List: 1)  Hydrochlorothiazide 25 Mg Tabs (Hydrochlorothiazide) .... Take one by mouth daily 2)  Calcium + D Tabs (Calcium-vitamin d tabs) .... Take 600 mg two by mouth daily 3)  Zestril 10 Mg Tabs (Lisinopril) .... Take one by mouth daily 4)  Adult Aspirin Low Strength 81 Mg Tbdp (Aspirin) .... Take one by mouth daily 5)  Glucometer  .... To check blood sugar once daily and as needed for diabetes 250.0 6)  Freestyle Lite Test Strp (  Glucose blood) .... Check blood sugar once   daily and as needed 7)  Glipizide Xl 5 Mg Tb24 (Glipizide) .Marland Kitchen.. 1 tablet by mouth daily 8)  Freestyle Lancets Misc (Lancets) .... Check blood sugar once daily and as needed. 9)  Niacin 500 Mg Tabs (Niacin) .... Take one tablet by mouth nightly 10)  Fish Oil Oil (Fish oil) .... Otc as directed. 11)  Flax Oil (Flaxseed (linseed)) .... Otc as directed. 12)  Alprazolam 0.25 Mg Tabs (Alprazolam) .... Take 1/2 tablet at bedtime as needed 13)  Buspirone Hcl 15 Mg Tabs (Buspirone hcl) .... 1/2 by mouth two times a day for anxiety  Patient Instructions: 1)  update me if chest pain worsens - can use a warm compress 2)  if you want counseling in future - let me know 3)  start buspar 1/2 pill twice daily --- if side eff or if you feel worse or depressed- stop it and call 4)  use xanax only if needed  5)  follow up with me in 4-6 weeks  Prescriptions: BUSPIRONE HCL 15 MG TABS (BUSPIRONE HCL) 1/2 by mouth two  times a day for anxiety  #30 x 11   Entered and Authorized by:   Judith Part MD   Signed by:   Judith Part MD on 11/23/2009   Method used:   Print then Give to Patient   RxID:   [id number] ALPRAZOLAM 0.25 MG TABS (ALPRAZOLAM) take 1/2 tablet at bedtime as needed  #30 x 0   Entered and Authorized by:   Judith Part MD   Signed by:   Judith Part MD on 11/23/2009   Method used:   Print then Give to Patient   RxID:   [id number] BUSPIRONE HCL 15 MG TABS (BUSPIRONE HCL) 1 by mouth two times a day for anxiety  #30 x 11   Entered and Authorized by:   Judith Part MD   Signed by:   Judith Part MD on 11/23/2009   Method used:   Print then Give to Patient   RxID:   [id number]   Current Allergies (reviewed today): ! LIPITOR ! ZOCOR (SIMVASTATIN) SULFA ASA METFORMIN HCL

## 2010-09-19 NOTE — Progress Notes (Signed)
Summary: refill request for alprazolam  Phone Note Refill Request Message from:  Fax from Pharmacy  Refills Requested: Medication #1:  ALPRAZOLAM 0.25 MG TABS take 1/2 tablet at bedtime as needed   Last Refilled: 11/23/2009 Faxed request from [facility] [address].  Initial call taken by: Lowella Petties CMA,  May 05, 2010 8:23 AM  Follow-up for Phone Call        px written on EMR for call in  Follow-up by: Judith Part MD,  May 05, 2010 8:39 AM  Additional Follow-up for Phone Call Additional follow up Details #1::        [facility]. Lewanda Rife LPN  May 05, 2010 10:47 AM     New/Updated Medications: ALPRAZOLAM 0.25 MG TABS (ALPRAZOLAM) take 1/2 tablet at bedtime as needed Prescriptions: ALPRAZOLAM 0.25 MG TABS (ALPRAZOLAM) take 1/2 tablet at bedtime as needed  #30 x 0   Entered and Authorized by:   Judith Part MD   Signed by:   Lewanda Rife LPN on 54/04/8118   Method used:   Telephoned to ...       [facility] [address] [id number]       [address]       [place], [place]  [address]       Ph: [phone]       Fax: [phone]   RxID:   [id number]

## 2010-09-19 NOTE — Progress Notes (Signed)
Summary: ? Side effect from med  Phone Note Call from Patient Call back at [phone]   Caller: Patient Call For: Dr. Dayton Martes Summary of Call: Patient was seen yesterday and was given Azithromycin and she is dizzy and has been all day so far. Patient has been reading about this medication and a side effect of this medication is dizziness and she has liver and kidney problems also. Patient wants to know if she should continue to take this medication? [facility]acy [facility]. Initial call taken by: Sydell Axon LPN,  September 06, 2009 11:32 AM  Follow-up for Phone Call        Stop taking medication.  I will call in amoxicillin for her. Follow-up by: Ruthe Mannan MD,  September 06, 2009 11:34 AM    New/Updated Medications: AMOXICILLIN 500 MG TABS (AMOXICILLIN) 1 tab by mouth two times a day x 10 days Prescriptions: AMOXICILLIN 500 MG TABS (AMOXICILLIN) 1 tab by mouth two times a day x 10 days  #20 x 0   Entered and Authorized by:   Ruthe Mannan MD   Signed by:   Ruthe Mannan MD on 09/06/2009   Method used:   [facility] to        [facility]  [address] [facility] [address] [id number]       [address]       [place], [place]  [address]       Ph: [phone]       Fax: [phone]   RxID:   [id number]   Appended Document: ? Side effect from med Patient Advised.

## 2010-09-19 NOTE — Progress Notes (Signed)
Summary: uti  Phone Note Call from Patient Call back at Home Phone [phone]   Caller: Patient Call For: Judith Part MD Summary of Call: Patient says that she has UTI and wants to be seen today, burning while urinating, hurts, and she is feeling very tired. I let her know that we did not have app available, but that [facility] was taking our overflow. She wants to know if she can just drop off a uringe sample. Please advise. Initial call taken by: Melody Comas,  October 07, 2009 11:29 AM  Follow-up for Phone Call        I know her well enough to drop off sample -- send this back to me when it arrives --just attatch ua to this phone note-- do not need to open a nurse visit  Follow-up by: Judith Part MD,  October 07, 2009 11:42 AM  Additional Follow-up for Phone Call Additional follow up Details #1::        Patient Advised.   She says she will likely come around 2:30 p.m.  Lugene Fuquay CMA (AAMA)  October 07, 2009 11:48 AM   New Problems: DYSURIA (ICD-788.1)   Additional Follow-up for Phone Call Additional follow up Details #2::    Pt fave urine specimen and if med needs to be called in pt uses [facility]. Pt can be reached at [phone]. Thank you.Lewanda Rife LPN  October 07, 2009 2:56 PM   New Problems: DYSURIA (ICD-788.1)  Laboratory Results   Urine Tests  Date/Time Received: October 07, 2009 2:53 PM  Date/Time Reported: October 07, 2009 2:53 PM   Routine Urinalysis   Color: yellow Appearance: Clear Glucose: negative   (Normal Range: Negative) Bilirubin: negative   (Normal Range: Negative) Ketone: negative   (Normal Range: Negative) Spec. Gravity: 1.010   (Normal Range: 1.003-1.035) Blood: trace-intact   (Normal Range: Negative) pH: 6.5   (Normal Range: 5.0-8.0) Protein: trace   (Normal Range: Negative) Urobilinogen: 0.2   (Normal Range: 0-1) Nitrite: negative   (Normal Range: Negative) Leukocyte Esterace: negative   (Normal Range:  Negative)  Urine Microscopic WBC/HPF: 0 RBC/HPF: 0 Bacteria/HPF: 0 Mucous/HPF: few Epithelial/HPF: 1-3 Crystals/HPF: 1-2 Casts/LPF: 0 Yeast/HPF: 0 Other: 0    Comments: please let her know ua is normal  ask her to drink a lot of water -- I will culture it just in case -- and update when it returns please f/u if symptoms do not improve      Patient Advised. Delilah Shan CMA Duncan Dull)  October 07, 2009 4:19 PM

## 2010-09-19 NOTE — Progress Notes (Signed)
Summary: Patient advised to go to ER  Phone Note Call from Patient   Caller: Patient Call For: Judith Part MD Summary of Call: Patient has been having chest pains for the past few days.  Pains go to her back down her right arm into her hand and fingers.  She is having some SOB, but denies any wheezing.  I advised her to go to the ER right away.  I asked if there was anyone there with her that could take her and she said that she would have her husband take her now.  I advised her to call us back and let us know how things went. Initial call taken by: Linde Gillis CMA Duncan Dull),  November 15, 2009 8:18 AM  Follow-up for Phone Call        I agree with above recommendation Follow-up by: Judith Part MD,  November 15, 2009 8:31 AM

## 2010-09-19 NOTE — Assessment & Plan Note (Signed)
Summary: COUGH LOW GRADE FEVER/DLO   Vital Signs:  Patient profile:   70 year old female Height:      61 inches Weight:      168.50 pounds BMI:     31.95 O2 Sat:      95 % on Room air Temp:     98.2 degrees F oral Pulse rate:   88 / minute Pulse rhythm:   regular Resp:     16 per minute BP sitting:   132 / 76  (left arm) Cuff size:   regular  Vitals Entered By: Delilah Shan CMA  Dull) (June 07, 2010 3:14 PM)  O2 Flow:  Room air CC: Cough, low grade fever   History of Present Illness: URI: duration of symptoms: several weeks rhinorrhea: yes congestion:yes ear pain:yes sore throat:yes cough:yes myalgias:yes other concerns: episodic fevers and sweats Quit smoking on Sunday, not on chantix yest.   Got some better transiently on augment but didn't resolve totally  Allergies: 1)  ! Lipitor 2)  ! Zocor (Simvastatin) 3)  Sulfa 4)  Asa 5)  Metformin Hcl  Review of Systems       See HPI.  Otherwise negative.    Physical Exam  General:  GEN: nad, alert and oriented HEENT: mucous membranes moist, TM w/o erythema, nasal epithelium injected, OP with cobblestoning NECK: supple w/o LA CV: rrr. PULM: ctab, no inc wob ABD: soft, +bs EXT: no edema    Impression & Recommendations:  Problem # 1:  SINUSITIS - ACUTE-NOS (ICD-461.9) Given the duration, I would proceed with tx as below along with supportive measures. follow up as needed.  The following medications were removed from the medication list:    Augmentin 875-125 Mg Tabs (Amoxicillin-pot clavulanate) .Marland Kitchen... 1 by mouth two times a day for 10 days for sinus infection Her updated medication list for this problem includes:    Zithromax 250 Mg Tabs (Azithromycin) .Marland Kitchen... 2 by mouth today and then 1 by mouth once daily for 4 days  Complete Medication List: 1)  Hydrochlorothiazide 25 Mg Tabs (Hydrochlorothiazide) .... Take one by mouth daily 2)  Calcium + D Tabs (Calcium-vitamin d tabs) .... Take 600 mg two by mouth  daily 3)  Zestril 10 Mg Tabs (Lisinopril) .... Take one by mouth daily 4)  Adult Aspirin Low Strength 81 Mg Tbdp (Aspirin) .... Take one by mouth daily 5)  Glucometer  .... To check blood sugar once daily and as needed for diabetes 250.0 6)  Freestyle Lite Test Strp (Glucose blood) .... Check blood sugar once   daily and as needed 7)  Glipizide Xl 5 Mg Tb24 (Glipizide) .Marland Kitchen.. 1 tablet by mouth daily 8)  Freestyle Lancets Misc (Lancets) .... Check blood sugar once daily and as needed. 9)  Niacin 500 Mg Tabs (Niacin) .... Take one tablet by mouth nightly 10)  Fish Oil Oil (Fish oil) .... Otc as directed. 11)  Flax Oil (Flaxseed (linseed)) .... Otc as directed. 12)  Alprazolam 0.25 Mg Tabs (Alprazolam) .... Take 1/2 tablet at bedtime as needed 13)  Buspirone Hcl 15 Mg Tabs (Buspirone hcl) .... 1/2 by mouth two times a day for anxiety 14)  Chantix Starting Month Pak 0.5 Mg X 11 & 1 Mg X 42 Tabs (Varenicline tartrate) .... Take by mouth as directed 15)  Chantix 1 Mg Tabs (Varenicline tartrate) .Marland Kitchen.. 1 by mouth two times a day after finishing starter pack 16)  Zithromax 250 Mg Tabs (Azithromycin) .... 2 by mouth today and then 1  by mouth once daily for 4 days  Patient Instructions: 1)  Take the antibiotics as directed and use nasal saline to wash out your nose several times a day.  Let us know if you aren't some better by next week.   Prescriptions: ZITHROMAX 250 MG TABS (AZITHROMYCIN) 2 by mouth today and then 1 by mouth once daily for 4 days  #6 x 0   Entered and Authorized by:   Crawford Givens MD   Signed by:   Crawford Givens MD on 06/07/2010   Method used:   [facility] to        [phone][facility] (retail)       [address]       [place], [place]  [address]       Ph: [phone]       Fax: [phone]   RxID:   [id number]    Orders Added: 1)  Est. Patient Level III [24401]    Current Allergies (reviewed today): ! LIPITOR ! ZOCOR  (SIMVASTATIN) SULFA ASA METFORMIN HCL

## 2010-09-19 NOTE — Letter (Signed)
[facility]   Imported By: Lester Butterfield 07/22/2010 09:29:20  _____________________________________________________________________  External Attachment:    Type:   Image     Comment:   External Document  Appended Document: [facility]     Clinical Lists Changes  Observations: Added new observation of DMEYEEXAMNXT: 07/2011 (07/25/2010 10:32) Added new observation of DMEYEEXMRES: normal (07/11/2010 10:33) Added new observation of EYE EXAM BY: Dr Willis Modena (07/11/2010 10:33) Added new observation of DIAB EYE EX: normal (07/11/2010 10:33)        Diabetes Management Exam:    Eye Exam:       Eye Exam done elsewhere          Date: 07/11/2010          Results: normal          Done by: Dr Willis Modena

## 2010-09-19 NOTE — Progress Notes (Signed)
Summary: cant tolerate prednisone  Phone Note Call from Patient Call back at Home Phone [phone]   Caller: Patient Summary of Call: Pt was seen on tuesday and given prednisone for bronchitis and sinus infection.  She says she is unable to take this, making her shaky and jumpy.  Ok to stop taking? Initial call taken by: Lowella Petties CMA,  May 11, 2010 9:04 AM  Follow-up for Phone Call        rec drop to 1 pill daily x 7 days, if still causing shakes, ok to stop.  thanks Follow-up by: Eustaquio Boyden  MD,  May 11, 2010 9:28 AM  Additional Follow-up for Phone Call Additional follow up Details #1::        Patient notified and will try to use 1 once daily. She was also concerned about her skin turning red and being hot. I reassured her that this is a common side effect of steroids and it was not an allergic reaction to them. I informed her that steroids react on the temperature receptors and hormones and can cause people to feel hot and turn red. I encouraged plenty of fluids to help with this. She verbalized understanding and will call back with any further problems. Additional Follow-up by: Janee Morn CMA Duncan Dull),  May 11, 2010 9:38 AM    Additional Follow-up for Phone Call Additional follow up Details #2::    thanks.  if skin rxn progresses, will need to be seen. Follow-up by: Eustaquio Boyden  MD,  May 11, 2010 10:29 AM

## 2010-09-19 NOTE — Progress Notes (Signed)
Summary: refill request for alprazolam  Phone Note Refill Request Message from:  Fax from Pharmacy  Refills Requested: Medication #1:  ALPRAZOLAM 0.25 MG TABS take 1/2 tablet at bedtime as needed   Last Refilled: 05/05/2010 Faxed request from [facility].  Initial call taken by: Lowella Petties CMA, AAMA,  July 25, 2010 8:09 AM  Follow-up for Phone Call        px written on EMR for call in  Follow-up by: Judith Part MD,  July 25, 2010 9:44 AM  Additional Follow-up for Phone Call Additional follow up Details #1::        Medication [facility] as instructed. Lewanda Rife LPN  July 25, 2010 10:06 AM     New/Updated Medications: ALPRAZOLAM 0.25 MG TABS (ALPRAZOLAM) take 1/2 tablet at bedtime as needed Prescriptions: ALPRAZOLAM 0.25 MG TABS (ALPRAZOLAM) take 1/2 tablet at bedtime as needed  #30 x 0   Entered and Authorized by:   Judith Part MD   Signed by:   Lewanda Rife LPN on 04/54/0981   Method used:   Telephoned to ...       [id number]       [address]       [place], [place]  [address]       Ph: [phone]       Fax: [phone]   RxID:   [id number]

## 2010-09-19 NOTE — Assessment & Plan Note (Signed)
Summary: 6 month follow up Erin Beck/S FROM 6/21&7/11   Vital Signs:  Patient profile:   70 year old female Height:      61 inches Weight:      165 pounds BMI:     31.29 Temp:     98.4 degrees F oral Pulse rate:   68 / minute Pulse rhythm:   regular BP sitting:   120 / 70  (left arm) Cuff size:   regular  Vitals Entered By: Lewanda Rife LPN (January 25, 9561 9:38 AM) CC: six month f/u, Lipid Management   History of Present Illness: here for f/u of lipid/ DM , anxiety  on buspar for anx and stress it is really helping -- feels the best she has in years  can and feels like doing a lot more   tab-- is slowing down quite a bit -- down to 1/4 of a pack per day  plans to quit - no date yet   had chest pain - is much / much better   wt is stable   DM - due for [facility] - last 6 - very good  sugar in 80s-90s in the am -- no more low numbers    lipids   Last Lipid ProfileCholesterol: 212 (07/26/2009 8:39:09 AM)HDL:  32.10 (07/26/2009 8:39:09 AM)LDL:  DEL (04/20/2008 2:25:00 PM)Triglycerides:  Last Liver profileSGOT:  23 (07/26/2009 8:39:09 AM)SPGT:  29 (07/26/2009 8:39:09 AM)T. Bili:  Alk Phos:     bp is 120/70    Lipid Management History:      Positive NCEP/ATP III risk factors include female age 45 years old or older, diabetes, HDL cholesterol less than 40, current tobacco user, and hypertension.    Allergies: 1)  ! Lipitor 2)  ! Zocor (Simvastatin) 3)  Sulfa 4)  Asa 5)  Metformin Hcl  Past History:  Past Medical History: Last updated: 06/29/2008 Anxiety Diabetes mellitus, type II Diverticulitis, hx of Hyperlipidemia Osteoarthritis smoking osteopenia   opthy-- Dr Archie Endo  Past Surgical History: Last updated: 11/23/2009 Cholecystectomy Hysterectomy Total knee replacement knee arthroscopy- chondromalacia (1990) BE- neg (1984) TMST- neg (1983) Colonoscop divertic, polyps (08/04/2003) Dexa- osteopenia (09/2004). Colonoscopy- tics, hemorrhoids (11/2006) hosp 4/11  for aypical chest pain/ chest wall pain  4/11 neg nuclear cardiac study  4/11 nl CT of chest   Family History: Last updated: 09/07/2008 Father: DM Mother:  Siblings:  brother DM  aunt- ? kidney cancer   Social History: Last updated: 06/21/2008 Marital Status: Married Children:  Occupation:  smoker current   Risk Factors: Smoking Status: current (01/31/2007)  Review of Systems General:  Denies fatigue, malaise, sleep disorder, and sweats. Eyes:  Denies blurring and eye irritation. CV:  Denies chest pain or discomfort, palpitations, shortness of breath with exertion, and swelling of feet. Resp:  Denies cough and wheezing. GI:  Denies abdominal pain, bloody stools, change in bowel habits, and indigestion. GU:  Denies discharge and dysuria. MS:  Complains of stiffness; denies muscle aches and muscle weakness. Derm:  Denies itching, lesion(s), poor wound healing, and rash. Neuro:  Denies numbness and tingling. Psych:  Complains of anxiety; denies depression. Endo:  Denies excessive thirst and excessive urination. Heme:  Denies abnormal bruising and bleeding.  Physical Exam  General:  overweight but generally well appearing  Head:  normocephalic, atraumatic, and no abnormalities observed.   Eyes:  vision grossly intact, pupils equal, pupils round, and pupils reactive to light.  no conjunctival pallor, injection or icterus  Mouth:  pharynx pink and  moist.   Neck:  supple with full rom and no masses or thyromegally, no JVD or carotid bruit  Lungs:  Normal respiratory effort, chest expands symmetrically. Lungs are clear to auscultation, no crackles or wheezes. Heart:  Normal rate and regular rhythm. S1 and S2 normal without gallop, murmur, click, rub or other extra sounds. Abdomen:  Bowel sounds positive,abdomen soft and non-tender without masses, organomegaly or hernias noted. no renal bruits  Msk:  No deformity or scoliosis noted of thoracic or lumbar spine.  no acute joint  changes  Pulses:  Beck and L carotid,radial,femoral,dorsalis pedis and posterior tibial pulses are full and equal bilaterally Extremities:  No clubbing, cyanosis, edema, or deformity noted with normal full range of motion of all joints.   Neurologic:  sensation intact to light touch, gait normal, and DTRs symmetrical and normal.  no tremor  Skin:  Intact without suspicious lesions or rashes no jaundice or pallor  Cervical Nodes:  No lymphadenopathy noted Psych:  normal affect, talkative and pleasant   Diabetes Management Exam:    Foot Exam (with socks and/or shoes not present):       Sensory-Pinprick/Light touch:          Left medial foot (L-4): normal          Left dorsal foot (L-5): normal          Left lateral foot (S-1): normal          Right medial foot (L-4): normal          Right dorsal foot (L-5): normal          Right lateral foot (S-1): normal       Sensory-Monofilament:          Left foot: normal          Right foot: normal       Inspection:          Left foot: normal          Right foot: normal       Nails:          Left foot: normal          Right foot: normal   Impression & Recommendations:  Problem # 1:  ANXIETY DISORDER (ICD-300.00) Assessment Improved much imp with buspar- will continue this  enc exercise as well Her updated medication list for this problem includes:    Alprazolam 0.25 Mg Tabs (Alprazolam) .Marland Kitchen... Take 1/2 tablet at bedtime as needed    Buspirone Hcl 15 Mg Tabs (Buspirone hcl) .Marland Kitchen... 1/2 by mouth two times a day for anxiety  Problem # 2:  DISORDER, TOBACCO USE (ICD-305.1) Assessment: Improved improved with plan to quit this summer commended on that   Problem # 3:  HYPERTENSION, BENIGN ESSENTIAL (ICD-401.1) Assessment: Unchanged bp is good - no change in med lab today f/u 6 mo  Her updated medication list for this problem includes:    Hydrochlorothiazide 25 Mg Tabs (Hydrochlorothiazide) .Marland Kitchen... Take one by mouth daily    Zestril 10 Mg Tabs  (Lisinopril) .Marland Kitchen... Take one by mouth daily  Orders: Venipuncture (73710) TLB-Lipid Panel (80061-LIPID) TLB-Renal Function Panel (80069-RENAL) TLB-ALT (SGPT) (84460-ALT) TLB-AST (SGOT) (84450-SGOT) TLB-A1C / Hgb A1C (Glycohemoglobin) (83036-A1C)  BP today: 120/70 Prior BP: 114/66 (11/23/2009)  Labs Reviewed: K+: 3.7 (12/30/2008) Creat: : 0.8 (12/30/2008)   Chol: 212 (07/26/2009)   HDL: 32.10 (07/26/2009)   LDL: DEL (04/20/2008)   TG: 231.0 (07/26/2009)  Problem # 4:  DIABETES MELLITUS, TYPE II (  ICD-250.00) Assessment: Unchanged  very good control with glipizide  adv to call if low sugars  disc healthy diet (low simple sugar/ choose complex carbs/ low sat fat) diet and exercise in detail  lab today f/u 6 mo Her updated medication list for this problem includes:    Zestril 10 Mg Tabs (Lisinopril) .Marland Kitchen... Take one by mouth daily    Adult Aspirin Low Strength 81 Mg Tbdp (Aspirin) .Marland Kitchen... Take one by mouth daily    Glipizide Xl 5 Mg Tb24 (Glipizide) .Marland Kitchen... 1 tablet by mouth daily  Orders: Venipuncture (52841) TLB-Lipid Panel (80061-LIPID) TLB-Renal Function Panel (80069-RENAL) TLB-ALT (SGPT) (84460-ALT) TLB-AST (SGOT) (84450-SGOT) TLB-A1C / Hgb A1C (Glycohemoglobin) (83036-A1C)  Labs Reviewed: Creat: 0.8 (12/30/2008)     Last Eye Exam: normal (06/20/2008) Reviewed HgBA1c results: 6.0 (07/26/2009)  6.2 (12/30/2008)  Complete Medication List: 1)  Hydrochlorothiazide 25 Mg Tabs (Hydrochlorothiazide) .... Take one by mouth daily 2)  Calcium + D Tabs (Calcium-vitamin d tabs) .... Take 600 mg two by mouth daily 3)  Zestril 10 Mg Tabs (Lisinopril) .... Take one by mouth daily 4)  Adult Aspirin Low Strength 81 Mg Tbdp (Aspirin) .... Take one by mouth daily 5)  Glucometer  .... To check blood sugar once daily and as needed for diabetes 250.0 6)  Freestyle Lite Test Strp (Glucose blood) .... Check blood sugar once   daily and as needed 7)  Glipizide Xl 5 Mg Tb24 (Glipizide) .Marland Kitchen.. 1  tablet by mouth daily 8)  Freestyle Lancets Misc (Lancets) .... Check blood sugar once daily and as needed. 9)  Niacin 500 Mg Tabs (Niacin) .... Take one tablet by mouth nightly 10)  Fish Oil Oil (Fish oil) .... Otc as directed. 11)  Flax Oil (Flaxseed (linseed)) .... Otc as directed. 12)  Alprazolam 0.25 Mg Tabs (Alprazolam) .... Take 1/2 tablet at bedtime as needed 13)  Buspirone Hcl 15 Mg Tabs (Buspirone hcl) .... 1/2 by mouth two times a day for anxiety  Lipid Assessment/Plan:      Based on NCEP/ATP III, the patient's risk factor category is "history of diabetes".  The patient's lipid goals are as follows: Total cholesterol goal is 200; LDL cholesterol goal is 100; HDL cholesterol goal is 40; Triglyceride goal is 150.     Patient Instructions: 1)  work on quitting smoking- good job so far-- make july 4th your quit date  2)  labs today  3)  keep working on healthy diet and exercise 4)  update me if low sugars  5)  sched fasting lab and f/u in 6 month  6)  lipid/ast/alt/ AIC / microalb / renal   Current Allergies (reviewed today): ! LIPITOR ! ZOCOR (SIMVASTATIN) SULFA ASA METFORMIN HCL

## 2010-09-19 NOTE — Assessment & Plan Note (Signed)
Summary: ?SINUS INFECTION/CLE   Vital Signs:  Patient profile:   70 year old female Height:      61 inches Weight:      165.25 pounds BMI:     31.34 Temp:     98.2 degrees F oral Pulse rate:   76 / minute Pulse rhythm:   regular BP sitting:   104 / 62  (left arm) Cuff size:   regular  Vitals Entered By: Lewanda Rife LPN (May 19, 2010 9:14 AM) CC: ?sinus infection, left facial pain, left earache and left side of neck hurts   History of Present Illness: was here on the 20th for uri and given pred for copd exac (pt is a smoker )   lungs are better but sinuses are worse  took prednisone- did not tolerate well and had to take less   is having L sinus pain and L earache and miserable with that  blowing out white mucous - hard to get it out some runny nose and post nasal drainage no cough    is getting ready to quit smoking  wants to try chantix  is worried a bit about withdrawl symptoms and handing stress has tried before without success  Allergies: 1)  ! Lipitor 2)  ! Zocor (Simvastatin) 3)  Sulfa 4)  Asa 5)  Metformin Hcl  Past History:  Past Medical History: Last updated: 06/29/2008 Anxiety Diabetes mellitus, type II Diverticulitis, hx of Hyperlipidemia Osteoarthritis smoking osteopenia   opthy-- Dr Archie Endo  Past Surgical History: Last updated: 11/23/2009 Cholecystectomy Hysterectomy Total knee replacement knee arthroscopy- chondromalacia (1990) BE- neg (1984) TMST- neg (1983) Colonoscop divertic, polyps (08/04/2003) Dexa- osteopenia (09/2004). Colonoscopy- tics, hemorrhoids (11/2006) hosp 4/11 for aypical chest pain/ chest wall pain  4/11 neg nuclear cardiac study  4/11 nl CT of chest   Family History: Last updated: 09/07/2008 Father: DM Mother:  Siblings:  brother DM  aunt- ? kidney cancer   Social History: Last updated: 06/21/2008 Marital Status: Married Children:  Occupation:  smoker current   Risk Factors: Smoking Status:  current (01/31/2007)  Review of Systems General:  Complains of fatigue and malaise; denies chills and fever. Eyes:  Denies blurring and discharge. ENT:  Complains of hoarseness, nasal congestion, postnasal drainage, sinus pressure, and sore throat. CV:  Denies chest pain or discomfort and palpitations. Resp:  Complains of cough, sputum productive, and wheezing; denies pleuritic and shortness of breath. GI:  Denies abdominal pain and change in bowel habits. Derm:  Denies lesion(s), poor wound healing, and rash. Neuro:  Denies numbness and tingling.  Physical Exam  General:  overweight, tired appearing Head:  tender L ethmoid and maxillary sinuses  normocephalic, atraumatic, and no abnormalities observed.   Eyes:  vision grossly intact, pupils equal, pupils round, and pupils reactive to light.  no conjunctival pallor, injection or icterus  Ears:  R ear normal and L ear normal.   Nose:  nares are injected and congested bilaterally  Mouth:  pharynx pink and moist, no erythema, and no exudates.   Neck:  No deformities, masses, or tenderness noted. Lungs:  CTA with hars bs at bases that are distant minimal wheeze mildly prolonged exp phase no rales or rhonchi Heart:  Normal rate and regular rhythm. S1 and S2 normal without gallop, murmur, click, rub or other extra sounds. Skin:  Intact without suspicious lesions or rashes Cervical Nodes:  No lymphadenopathy noted Inguinal Nodes:  No significant adenopathy Psych:  normal affect, talkative and pleasant  Impression & Recommendations:  Problem # 1:  SINUSITIS - ACUTE-NOS (ICD-461.9) Assessment New  cover with augmentin and update recommend sympt care- see pt instructions   pt advised to update me if symptoms worsen or do not improve  The following medications were removed from the medication list:    Doxycycline Hyclate 100 Mg Caps (Doxycycline hyclate) .Marland Kitchen... Take one by mouth two times a day x 7 days Her updated medication list  for this problem includes:    Augmentin 875-125 Mg Tabs (Amoxicillin-pot clavulanate) .Marland Kitchen... 1 by mouth two times a day for 10 days for sinus infection  Orders: Prescription Created Electronically [phone])  Problem # 2:  DISORDER, TOBACCO USE (ICD-305.1) Assessment: Unchanged  plan to quit outlined copd exac is better after prednisone discussed in detail risks of smoking, and possible outcomes including COPD, vascular dz, cancer and also respiratory infections/sinus problems  will try chantix  disc side eff incl SI and dep in detail - will be cautious  Her updated medication list for this problem includes:    Chantix Starting Month Pak 0.5 Mg X 11 & 1 Mg X 42 Tabs (Varenicline tartrate) .Marland Kitchen... Take by mouth as directed    Chantix 1 Mg Tabs (Varenicline tartrate) .Marland Kitchen... 1 by mouth two times a day after finishing starter pack  Orders: Prescription Created Electronically [id number])  Problem # 3:  VIRAL URI (ICD-465.9) Assessment: Improved  with copd exac- this has improved with low dose prednisone that she tolerated poorly overal imp in lung exam today Her updated medication list for this problem includes:    Adult Aspirin Low Strength 81 Mg Tbdp (Aspirin) .Marland Kitchen... Take one by mouth daily  Orders: Prescription Created Electronically [phone])  Complete Medication List: 1)  Hydrochlorothiazide 25 Mg Tabs (Hydrochlorothiazide) .... Take one by mouth daily 2)  Calcium + D Tabs (Calcium-vitamin d tabs) .... Take 600 mg two by mouth daily 3)  Zestril 10 Mg Tabs (Lisinopril) .... Take one by mouth daily 4)  Adult Aspirin Low Strength 81 Mg Tbdp (Aspirin) .... Take one by mouth daily 5)  Glucometer  .... To check blood sugar once daily and as needed for diabetes 250.0 6)  Freestyle Lite Test Strp (Glucose blood) .... Check blood sugar once   daily and as needed 7)  Glipizide Xl 5 Mg Tb24 (Glipizide) .Marland Kitchen.. 1 tablet by mouth daily 8)  Freestyle Lancets Misc (Lancets) .... Check blood sugar once daily  and as needed. 9)  Niacin 500 Mg Tabs (Niacin) .... Take one tablet by mouth nightly 10)  Fish Oil Oil (Fish oil) .... Otc as directed. 11)  Flax Oil (Flaxseed (linseed)) .... Otc as directed. 12)  Alprazolam 0.25 Mg Tabs (Alprazolam) .... Take 1/2 tablet at bedtime as needed 13)  Buspirone Hcl 15 Mg Tabs (Buspirone hcl) .... 1/2 by mouth two times a day for anxiety 14)  Augmentin 875-125 Mg Tabs (Amoxicillin-pot clavulanate) .Marland Kitchen.. 1 by mouth two times a day for 10 days for sinus infection 15)  Chantix Starting Month Pak 0.5 Mg X 11 & 1 Mg X 42 Tabs (Varenicline tartrate) .... Take by mouth as directed 16)  Chantix 1 Mg Tabs (Varenicline tartrate) .Marland Kitchen.. 1 by mouth two times a day after finishing starter pack  Patient Instructions: 1)  you can try mucinex over the counter twice daily as directed and nasal saline spray for congestion 2)  tylenol over the counter as directed may help with aches, headache and fever 3)  call if symptoms worsen or  if not improved in 4-5 days  4)  you can use allegra or claritin or zyrtec for allergies  5)  take the augmentin as directed for sinus infectoin  6)  start chantix when you are ready to quit smoking - good luck Prescriptions: CHANTIX 1 MG TABS (VARENICLINE TARTRATE) 1 by mouth two times a day after finishing starter pack  #60 x 3   Entered and Authorized by:   Judith Part MD   Signed by:   Judith Part MD on 05/19/2010   Method used:   Print then Give to Patient   RxID:   [id number] CHANTIX STARTING MONTH PAK 0.5 MG X 11 & 1 MG X 42 TABS (VARENICLINE TARTRATE) take by mouth as directed  #1 pack x 0    Entered and Authorized by:   Judith Part MD   Signed by:   Judith Part MD on 05/19/2010   Method used:   Print then Give to Patient   RxID:   [id number] AUGMENTIN 875-125 MG TABS (AMOXICILLIN-POT CLAVULANATE) 1 by mouth two times a day for 10 days for sinus infection  #20 x 0   Entered and Authorized by:   Judith Part MD    Signed by:   Judith Part MD on 05/19/2010   Method used:   [facility]        [facility] [address] [id number]       [address]       [place], [place]  [address]       Ph: [phone]       Fax: [phone]   RxID:   [id number]   Current Allergies (reviewed today): ! LIPITOR ! ZOCOR (SIMVASTATIN) SULFA ASA METFORMIN HCL

## 2010-09-21 NOTE — Assessment & Plan Note (Signed)
Summary: EAR,ST,CONGESTION,COUGH/CLE   Vital Signs:  Patient profile:   70 year old female Height:      61 inches Weight:      173.25 pounds BMI:     32.85 Temp:     97.6 degrees F oral Pulse rate:   84 / minute Pulse rhythm:   regular BP sitting:   132 / 70  (left arm) Cuff size:   regular  Vitals Entered By: Delilah Shan CMA  Dull) (August 24, 2010 2:27 PM) CC: Ear, ST, congestion, cough   History of Present Illness: URI symptoms:no fevers, but "I've felt hot."   duration of symptoms:  ~1 week rhinorrhea: yes congestion:yes ear pain:yes, L>R sore throat: yes cough:yes, dry myalgias:yes other concerns: off cigs since 10/11.  didn't use the chantix.   + sick contacts.    Allergies: 1)  ! Lipitor 2)  ! Zocor (Simvastatin) 3)  Sulfa 4)  Asa 5)  Metformin Hcl  Review of Systems       See HPI.  Otherwise negative.    Physical Exam  General:  GEN: nad, alert and oriented HEENT: mucous membranes moist, R TM w/o erythema, L TM bulging and red, nasal epithelium injected, OP with cobblestoning NECK: supple w/o LA CV: rrr. PULM: ctab, no inc wob ABD: soft, +bs EXT: no edema    Impression & Recommendations:  Problem # 1:  OTITIS MEDIA, LEFT (ICD-382.9) Start amoxil and follow up as needed.  Supportive tx o/w.  Nontoxic.  Call back as needed.  She understood.  Her updated medication list for this problem includes:    Adult Aspirin Low Strength 81 Mg Tbdp (Aspirin) .Marland Kitchen... Take one by mouth daily    Amoxicillin 875 Mg Tabs (Amoxicillin) .Marland Kitchen... 1 by mouth two times a day  Orders: Prescription Created Electronically [id number])  Complete Medication List: 1)  Hydrochlorothiazide 25 Mg Tabs (Hydrochlorothiazide) .... Take one by mouth daily 2)  Calcium + D Tabs (Calcium-vitamin d tabs) .... Take 600 mg two by mouth daily 3)  Zestril 10 Mg Tabs (Lisinopril) .... Take one by mouth daily 4)  Adult Aspirin Low Strength 81 Mg Tbdp (Aspirin) .... Take one by mouth daily 5)   Glucometer  .... To check blood sugar once daily and as needed for diabetes 250.0 6)  Freestyle Lite Test Strp (Glucose blood) .... Check blood sugar once   daily and as needed 7)  Glipizide Xl 5 Mg Tb24 (Glipizide) .Marland Kitchen.. 1 tablet by mouth daily 8)  Freestyle Lancets Misc (Lancets) .... Check blood sugar once daily and as needed. 9)  Niacin 500 Mg Tabs (Niacin) .... Take one tablet by mouth nightly 10)  Fish Oil Oil (Fish oil) .... Otc as directed. 11)  Flax Oil (Flaxseed (linseed)) .... Otc as directed. 12)  Alprazolam 0.25 Mg Tabs (Alprazolam) .... Take 1/2 tablet at bedtime as needed 13)  Buspirone Hcl 15 Mg Tabs (Buspirone hcl) .... 1/2 by mouth two times a day for anxiety 14)  Omeprazole 20 Mg Cpdr (Omeprazole) .Marland Kitchen.. 1 by mouth once daily in am for acid reflux 15)  Amoxicillin 875 Mg Tabs (Amoxicillin) .Marland Kitchen.. 1 by mouth two times a day  Patient Instructions: 1)  Get plenty of rest, drink lots of clear liquids, and use Tylenol for fever and comfort. Start the antibiotics today.  This should gradually get better.   Prescriptions: AMOXICILLIN 875 MG TABS (AMOXICILLIN) 1 by mouth two times a day  #20 x 0   Entered and Authorized by:  Crawford Givens MD   Signed by:   Crawford Givens MD on 08/24/2010   Method used:   [facility]        [facility]  [address] [phone][facility] (retail)       [address]       [place], [place]  [address]       Ph: [phone]       Fax: [phone]   RxID:   [id number]    Orders Added: 1)  Est. Patient Level III [51884] 2)  Prescription Created Electronically [id number]    Current Allergies (reviewed today): ! LIPITOR ! ZOCOR (SIMVASTATIN) SULFA ASA METFORMIN HCL

## 2010-09-21 NOTE — Assessment & Plan Note (Signed)
Summary: F/U AFTER LABS / LFW   Vital Signs:  Patient profile:   70 year old female Height:      61 inches Weight:      171.25 pounds BMI:     32.47 Temp:     98 degrees F oral Pulse rate:   80 / minute Pulse rhythm:   regular BP sitting:   114 / 70  (left arm) Cuff size:   regular  Vitals Entered By: Lewanda Rife LPN (July 31, 2010 9:28 AM) CC: six month f/u   History of Present Illness: here for f/u of HTN and DM and lipids   has been feeling pretty good overall   had her eyes checked 2 wk ago -- does have cataract but no DM damage  [facility] [address] -- had dilated exam   will have to have R knee replaced     wt is up 3 lb with bmi of 32 is trying to get some exercise   DM is good  6.2 AIc on glipizide  opthy is up to date   lipids fairly stable  pt does not tol any meds LDL is 148 intol of 2 statins  not ready to try zetia -- and may reconsider later is eating a low sat fat diet - occ eats deer meat      smoking- given chantix  did not use it -- she quit on her own !!! is so proud of that  quit after the last visit  it was not hard becuase she was so sick   acid reflux is much worse heartburn and tums does not help no n/v or abd pain    Anticoagulation Management History:      Positive risk factors for bleeding include an age of 66 years or older and presence of serious comorbidities.  The bleeding index is 'intermediate risk'.  Positive CHADS2 values include History of HTN and History of Diabetes.  Negative CHADS2 values include Age > 28 years old.    Preventive Screening-Counseling & Management  Alcohol-Tobacco     Smoking Status: quit  Allergies: 1)  ! Lipitor 2)  ! Zocor (Simvastatin) 3)  Sulfa 4)  Asa 5)  Metformin Hcl  Past History:  Family History: Last updated: 09/07/2008 Father: DM Mother:  Siblings:  brother DM  aunt- ? kidney cancer   Social History: Last updated: 07/31/2010 Marital Status:  Married Children:  Occupation:  Former Smoker-- quit 2011  Risk Factors: Smoking Status: quit (07/31/2010)  Past Medical History: Anxiety Diabetes mellitus, type II Diverticulitis, hx of Hyperlipidemia Osteoarthritis smoking osteopenia  GERD  opthy-- Dr Archie Endo  Social History: Marital Status: Married Children:  Occupation:  Former Smoker-- quit 2011 Smoking Status:  quit  Review of Systems General:  Denies fatigue and loss of appetite. Eyes:  Denies blurring and eye irritation. CV:  Denies chest pain or discomfort, lightheadness, and palpitations. Resp:  Denies cough and wheezing. GI:  Complains of indigestion; denies abdominal pain, change in bowel habits, and nausea. GU:  Denies dysuria and urinary frequency. Derm:  Denies lesion(s), poor wound healing, and rash. Neuro:  Denies numbness and tingling. Endo:  Denies cold intolerance, excessive thirst, excessive urination, and heat intolerance. Heme:  Denies abnormal bruising and bleeding.  Physical Exam  General:  overweight but generally well appearing  Head:  normocephalic, atraumatic, and no abnormalities observed.   Eyes:  vision grossly intact, pupils equal, pupils round, and pupils reactive to light.  no conjunctival pallor, injection or icterus  Mouth:  pharynx pink and moist.   Neck:  supple with full rom and no masses or thyromegally, no JVD or carotid bruit  Chest Wall:  No deformities, masses, or tenderness noted. Lungs:  diffusely distant bs good air exch Heart:  Normal rate and regular rhythm. S1 and S2 normal without gallop, murmur, click, rub or other extra sounds. Abdomen:  Bowel sounds positive,abdomen soft and non-tender without masses, organomegaly or hernias noted. no renal bruit  Msk:  No deformity or scoliosis noted of thoracic or lumbar spine.  no acute joint changes  Pulses:  2+ rad pulses Extremities:   no edema Neurologic:  sensation intact to light touch, gait normal, and DTRs  symmetrical and normal.  no tremor  Skin:  Intact without suspicious lesions or rashes Cervical Nodes:  No lymphadenopathy noted Psych:  normal affect, talkative and pleasant   Diabetes Management Exam:    Foot Exam (with socks and/or shoes not present):       Sensory-Pinprick/Light touch:          Left medial foot (L-4): normal          Left dorsal foot (L-5): normal          Left lateral foot (S-1): normal          Right medial foot (L-4): normal          Right dorsal foot (L-5): normal          Right lateral foot (S-1): normal       Sensory-Monofilament:          Left foot: normal          Right foot: normal       Inspection:          Left foot: normal          Right foot: normal       Nails:          Left foot: normal          Right foot: normal   Impression & Recommendations:  Problem # 1:  TOBACCO USE, QUIT (ICD-V15.82) Assessment New congratulated on that -- ! great news disc strategies to stay off cig   Problem # 2:  HYPERTENSION, BENIGN ESSENTIAL (ICD-401.1) Assessment: Unchanged  this is very well controlled lab reviewed  disc low impact exercise no change in med f/u 6 mo Her updated medication list for this problem includes:    Hydrochlorothiazide 25 Mg Tabs (Hydrochlorothiazide) .Marland Kitchen... Take one by mouth daily    Zestril 10 Mg Tabs (Lisinopril) .Marland Kitchen... Take one by mouth daily  BP today: 114/70 Prior BP: 132/76 (06/07/2010)  Prior 10 Yr Risk Heart Disease: Not enough information (01/25/2010)  Labs Reviewed: K+: 3.9 (07/27/2010) Creat: : 0.9 (07/27/2010)   Chol: 222 (07/27/2010)   HDL: 39.30 (07/27/2010)   LDL: DEL (04/20/2008)   TG: 185.0 (07/27/2010)  Problem # 3:  OSTEOARTHRITIS (ICD-715.90) Assessment: Deteriorated this is worse in knee  will eventually need repl  for now disc low impact exercise like water  Her updated medication list for this problem includes:    Adult Aspirin Low Strength 81 Mg Tbdp (Aspirin) .Marland Kitchen... Take one by mouth  daily  Problem # 4:  HYPERLIPIDEMIA (ICD-272.4) Assessment: Unchanged  this is still high non tol statin ? consider zetia-- apprehensive  re check  mo and disc again rev low sat fat diet  Her updated medication list for this problem includes:  Niacin 500 Mg Tabs (Niacin) .Marland Kitchen... Take one tablet by mouth nightly  Labs Reviewed: SGOT: 21 (07/27/2010)   SGPT: 29 (07/27/2010)  Lipid Goals: Chol Goal: 200 (01/25/2010)   HDL Goal: 40 (01/25/2010)   LDL Goal: 100 (01/25/2010)   TG Goal: 150 (01/25/2010)  Prior 10 Yr Risk Heart Disease: Not enough information (01/25/2010)   HDL:39.30 (07/27/2010), 39.30 (01/25/2010)  LDL:DEL (04/20/2008), DEL (10/03/2007)  Chol:222 (07/27/2010), 231 (01/25/2010)  Trig:185.0 (07/27/2010), 263.0 (01/25/2010)  Problem # 5:  DIABETES MELLITUS, TYPE II (ICD-250.00) Assessment: Unchanged  very good control with glipizide and low sugar diet  rev labs f/u 6 mo  Her updated medication list for this problem includes:    Zestril 10 Mg Tabs (Lisinopril) .Marland Kitchen... Take one by mouth daily    Adult Aspirin Low Strength 81 Mg Tbdp (Aspirin) .Marland Kitchen... Take one by mouth daily    Glipizide Xl 5 Mg Tb24 (Glipizide) .Marland Kitchen... 1 tablet by mouth daily  Labs Reviewed: Creat: 0.9 (07/27/2010)     Last Eye Exam: normal (07/11/2010) Reviewed HgBA1c results: 6.2 (07/27/2010)  6.1 (01/25/2010)  Problem # 6:  GERD (ICD-530.81) Assessment: New  new and worse with wt gain disc lifestyle change trial of omeprazole and update Her updated medication list for this problem includes:    Omeprazole 20 Mg Cpdr (Omeprazole) .Marland Kitchen... 1 by mouth once daily in am for acid reflux  Orders: Prescription Created Electronically [phone])  Complete Medication List: 1)  Hydrochlorothiazide 25 Mg Tabs (Hydrochlorothiazide) .... Take one by mouth daily 2)  Calcium + D Tabs (Calcium-vitamin d tabs) .... Take 600 mg two by mouth daily 3)  Zestril 10 Mg Tabs (Lisinopril) .... Take one by mouth daily 4)   Adult Aspirin Low Strength 81 Mg Tbdp (Aspirin) .... Take one by mouth daily 5)  Glucometer  .... To check blood sugar once daily and as needed for diabetes 250.0 6)  Freestyle Lite Test Strp (Glucose blood) .... Check blood sugar once   daily and as needed 7)  Glipizide Xl 5 Mg Tb24 (Glipizide) .Marland Kitchen.. 1 tablet by mouth daily 8)  Freestyle Lancets Misc (Lancets) .... Check blood sugar once daily and as needed. 9)  Niacin 500 Mg Tabs (Niacin) .... Take one tablet by mouth nightly 10)  Fish Oil Oil (Fish oil) .... Otc as directed. 11)  Flax Oil (Flaxseed (linseed)) .... Otc as directed. 12)  Alprazolam 0.25 Mg Tabs (Alprazolam) .... Take 1/2 tablet at bedtime as needed 13)  Buspirone Hcl 15 Mg Tabs (Buspirone hcl) .... 1/2 by mouth two times a day for anxiety 14)  Omeprazole 20 Mg Cpdr (Omeprazole) .Marland Kitchen.. 1 by mouth once daily in am for acid reflux  Anticoagulation Management Assessment/Plan:             Patient Instructions: 1)  you really need some low impact exercise 5 days per week 2)  congratulations on quitting smoking-- great job!!! 3)  blood pressure is good  4)  sugar control is good 5)  cholesterol is high -- will re check 6 months  6)  keep watching diet (you can raise your HDL (good cholesterol) by increasing exercise and eating omega 3 fatty acid supplement like fish oil or flax seed oil over the counter 7)  you can lower LDL (bad cholesterol) by limiting saturated fats in diet like red meat, fried foods, egg yolks, fatty breakfast meats, high fat dairy products and shellfish )  8)  schedule fasting lab in 6 months and then 30 minute check  up  9)  lipid/hepatic/ renal/ cbc with diff/ tsh / AIC   272,  250.0, 401.1 10)  start omeprazole for gerd and let me know if not improved Prescriptions: OMEPRAZOLE 20 MG CPDR (OMEPRAZOLE) 1 by mouth once daily in am for acid reflux  #30 x 11   Entered and Authorized by:   Judith Part MD   Signed by:   Judith Part MD on 07/31/2010    Method used:   [facility]        [facility]  [address] [facility] [address] [id number]       [address]       [place], [place]  [address]       Ph: [phone]       Fax: [phone]   RxID:   [id number]    Orders Added: 1)  Prescription Created Electronically [G8553] 2)  Est. Patient Level IV [24401]   Immunization History:  Influenza Immunization History:    Influ[facility]  [facility]   Immunization History:  Influenza Immunization History:    [facility]  [facility]  Current Allergies (reviewed today): ! LIPITOR ! ZOCOR (SIMVASTATIN) SULFA ASA METFORMIN HCL

## 2010-09-22 NOTE — Consult Note (Signed)
[facility]   Imported By: Lanelle Bal 11/18/2009 08:49:31  _____________________________________________________________________  External Attachment:    Type:   Image     Comment:   External Document

## 2010-09-29 ENCOUNTER — Telehealth: Payer: Self-pay | Admitting: Family Medicine

## 2010-10-05 NOTE — Progress Notes (Signed)
Summary: refill request for alprazolam  Phone Note Refill Request Message from:  Fax from Pharmacy  Refills Requested: Medication #1:  ALPRAZOLAM 0.25 MG TABS take 1/2 tablet at bedtime as needed   Last Refilled: 07/25/2010 Faxed request from [address], [phone].  Initial call taken by: Lowella Petties CMA, AAMA,  September 29, 2010 8:14 AM  Follow-up for Phone Call        px written on EMR for call in  Follow-up by: Judith Part MD,  September 29, 2010 8:49 AM  Additional Follow-up for Phone Call Additional follow up Details #1::        [facility] as instructed. Lewanda Rife LPN  September 29, 2010 1:04 PM     New/Updated Medications: ALPRAZOLAM 0.25 MG TABS (ALPRAZOLAM) take 1/2 tablet at bedtime as needed Prescriptions: ALPRAZOLAM 0.25 MG TABS (ALPRAZOLAM) take 1/2 tablet at bedtime as needed  #30 x 1   Entered and Authorized by:   Judith Part MD   Signed by:   Judith Part MD on 09/29/2010   Method used:   [facility]phoned to ...       [facility]  [address] [id number]       [address]       [place], [place]  [address]       Ph: [phone]       Fax: [phone]   RxID:   [id number]

## 2010-11-13 LAB — D-DIMER, QUANTITATIVE: D-Dimer, Quant: 0.32 ug/mL-FEU (ref 0.00–0.48)

## 2010-11-13 LAB — POCT CARDIAC MARKERS
Myoglobin, poc: 46.7 ng/mL (ref 12–200)
Troponin i, poc: <0.05 ng/mL (ref 0.00–0.09)

## 2010-11-13 LAB — LIPID PANEL
HDL: 34 mg/dL — ABNORMAL LOW (ref >39)
Total CHOL/HDL Ratio: 6.2 RATIO
Triglycerides: 222 mg/dL — ABNORMAL HIGH (ref <150)

## 2010-11-13 LAB — CARDIAC PANEL(CRET KIN+CKTOT+MB+TROPI)
CK, MB: 0.7 ng/mL (ref 0.3–4.0)
Relative Index: INVALID (ref 0.0–2.5)
Total CK: 48 U/L (ref 7–177)
Troponin I: <0.01 ng/mL (ref 0.00–0.06)

## 2010-11-13 LAB — COMPREHENSIVE METABOLIC PANEL
AST: 23 U/L (ref 0–37)
BUN: 10 mg/dL (ref 6–23)
CO2: 27 mEq/L (ref 19–32)
Calcium: 8.9 mg/dL (ref 8.4–10.5)
Chloride: 106 mEq/L (ref 96–112)
Creatinine, Ser: 0.68 mg/dL (ref 0.4–1.2)
GFR calc non Af Amer: >60 mL/min (ref >60)
Glucose, Bld: 107 mg/dL — ABNORMAL HIGH (ref 70–99)
Total Bilirubin: 0.6 mg/dL (ref 0.3–1.2)

## 2010-11-13 LAB — HEMOGLOBIN A1C
Hgb A1c MFr Bld: 6.2 % — ABNORMAL HIGH (ref 4.6–6.1)
Mean Plasma Glucose: 131 mg/dL

## 2010-11-13 LAB — DIFFERENTIAL
Basophils Absolute: 0 10*3/uL (ref 0.0–0.1)
Basophils Relative: 0 % (ref 0–1)
Eosinophils Absolute: 0.2 10*3/uL (ref 0.0–0.7)
Eosinophils Relative: 2 % (ref 0–5)
Monocytes Absolute: 0.5 10*3/uL (ref 0.1–1.0)
Monocytes Relative: 6 % (ref 3–12)

## 2010-11-13 LAB — URINALYSIS, ROUTINE W REFLEX MICROSCOPIC
Ketones, ur: NEGATIVE mg/dL
Nitrite: NEGATIVE
Protein, ur: NEGATIVE mg/dL
Urobilinogen, UA: 0.2 mg/dL (ref 0.0–1.0)

## 2010-11-13 LAB — POCT I-STAT, CHEM 8
BUN: 13 mg/dL (ref 6–23)
Chloride: 104 mEq/L (ref 96–112)
Creatinine, Ser: 0.7 mg/dL (ref 0.4–1.2)
Sodium: 138 mEq/L (ref 135–145)
TCO2: 28 mmol/L (ref 0–100)

## 2010-11-13 LAB — GLUCOSE, CAPILLARY
Glucose-Capillary: 112 mg/dL — ABNORMAL HIGH (ref 70–99)
Glucose-Capillary: 114 mg/dL — ABNORMAL HIGH (ref 70–99)
Glucose-Capillary: 122 mg/dL — ABNORMAL HIGH (ref 70–99)
Glucose-Capillary: 168 mg/dL — ABNORMAL HIGH (ref 70–99)

## 2010-11-13 LAB — CBC
HCT: 45.2 % (ref 36.0–46.0)
Hemoglobin: 15.6 g/dL — ABNORMAL HIGH (ref 12.0–15.0)
MCHC: 34.6 g/dL (ref 30.0–36.0)
MCV: 93.6 fL (ref 78.0–100.0)
RBC: 4.82 MIL/uL (ref 3.87–5.11)
RDW: 13 % (ref 11.5–15.5)

## 2010-11-13 LAB — MAGNESIUM: Magnesium: 2.2 mg/dL (ref 1.5–2.5)

## 2010-11-13 LAB — CK TOTAL AND CKMB (NOT AT ARMC): Relative Index: INVALID (ref 0.0–2.5)

## 2010-11-13 LAB — TROPONIN I: Troponin I: <0.01 ng/mL (ref 0.00–0.06)

## 2010-12-04 ENCOUNTER — Inpatient Hospital Stay (INDEPENDENT_AMBULATORY_CARE_PROVIDER_SITE_OTHER)
Admission: RE | Admit: 2010-12-04 | Discharge: 2010-12-04 | Disposition: A | Discharge status: Home / Self Care | Payer: Medicare Other | Source: Ambulatory Visit | Attending: Family Medicine | Admitting: Family Medicine

## 2010-12-04 DIAGNOSIS — T782XXA Anaphylactic shock, unspecified, initial encounter: Secondary | ICD-10-CM

## 2010-12-19 ENCOUNTER — Other Ambulatory Visit: Payer: Self-pay | Admitting: Family Medicine

## 2011-01-05 NOTE — Op Note (Signed)
Erin Beck, Erin Beck                ACCOUNT NO.:  [id number]   MEDICAL RECORD NO.:  [id number]                   PATIENT TYPE:  OBV   LOCATION:  0354                                 FACILITY:  [facility]   PHYSICIAN:  Sharlet Salina T. Hoxworth, M.D.          DATE OF BIRTH:  1940/11/10   DATE OF PROCEDURE:  04/09/2002  DATE OF DISCHARGE:  04/10/2002                                 OPERATIVE REPORT   PREOPERATIVE DIAGNOSES:  Cholelithiasis and cholecystitis.   POSTOPERATIVE DIAGNOSES:  Cholelithiasis and cholecystitis.   SURGICAL PROCEDURE:  Laparoscopic cholecystectomy.   SURGEON:  Lorne Skeens. Hoxworth, M.D.   ASSISTANT:  Gabrielle Dare. Janee Morn, MD   ANESTHESIA:  General.   BRIEF HISTORY:  Ms. Spelman is a 70 year old white female with a long history  of episode right upper quadrant pain. In the past week, she has had two  particularly severe prolonged episodes. She was evaluated with a gallbladder  ultrasound showing a single large gallstone and a HIDA scan that showed very  delayed partial filling of the gallbladder. On evaluation in my office  yesterday, she had significant epigastric and right upper quadrant  tenderness. LFTs are normal except for minimally elevated SGPT. I  recommended urgent laparoscopic cholecystectomy. The nature of the  procedure, its indications and risks of bleeding, infection, bile leak and  bile duct injury were discussed and understood. She is now brought to the  operating room for this procedure.   DESCRIPTION OF PROCEDURE:  The patient was brought to the operating room,  placed in supine position on the operating table and general endotracheal  anesthesia was induced. PAS were in place. She received preoperative  antibiotics. The abdomen was sterilely prepped and draped. Local anesthesia  was used to infiltrate the trocar sites prior to the incisions. A 1 cm  incision was made at the umbilicus, dissection carried down to the midline  fascia which  was sharply incised for 1 cm and the peritoneum entered under  direct vision. Through a mattress suture of #0 Vicryl, the Hasson trocar was  placed and pneumoperitoneum established. Under direct vision, a 10 mm trocar  was placed in the subxiphoid area and two 5 mm trocars along the right  subcostal margin. The gallbladder appeared somewhat edematous. The fundus  was grasped and elevated up over the liver and infundibulum retracted  inferolaterally. Fibrofatty tissue was stripped down off the neck of the  gallbladder. The distal gallbladder and Calot's triangle was thoroughly  dissected. The cystic artery was identified in Calot's triangle coursing  upon the gallbladder wall. The cystic gallbladder junction was dissected 360  degrees. When the anatomy was clear, the cystic duct and cystic artery were  doubly clipped proximally, clipped distally and divided. The gallbladder was  then dissected free from its bed using hook cautery and moved intact with  the umbilicus. Complete hemostasis was assured in the operative site and the  right upper quadrant was irrigated and suctioned unclear. The trocars were  removed under direct vision. All CO2 was evacuated from the peritoneal  cavity. The mattress sutures was secured with the  umbilicus. The skin incisions were closed with interrupted subcuticular 4-0  Monocryl and Steri-Strips. Sponge, needle and instrument counts were  correct. Dry sterile dressings were applied and the patient taken to  recovery in good condition.                                               Lorne Skeens. Hoxworth, M.D.    Tory Emerald  D:  04/09/2002  T:  04/10/2002  Job:  [id number]

## 2011-01-15 ENCOUNTER — Encounter: Payer: Self-pay | Admitting: Family Medicine

## 2011-01-18 ENCOUNTER — Other Ambulatory Visit: Payer: Self-pay | Admitting: *Deleted

## 2011-01-18 MED ORDER — ALPRAZOLAM 0.25 MG PO TABS: ORAL_TABLET | ORAL | Status: DC

## 2011-01-18 NOTE — Telephone Encounter (Signed)
Medication phoned to [facility] pharmacy as instructed.  

## 2011-01-18 NOTE — Telephone Encounter (Signed)
Px written for call in   

## 2011-01-22 ENCOUNTER — Other Ambulatory Visit (INDEPENDENT_AMBULATORY_CARE_PROVIDER_SITE_OTHER): Payer: Medicare Other | Admitting: Family Medicine

## 2011-01-22 DIAGNOSIS — E7800 Pure hypercholesterolemia, unspecified: Secondary | ICD-10-CM

## 2011-01-22 DIAGNOSIS — E119 Type 2 diabetes mellitus without complications: Secondary | ICD-10-CM

## 2011-01-22 DIAGNOSIS — I10 Essential (primary) hypertension: Secondary | ICD-10-CM

## 2011-01-22 LAB — HEPATIC FUNCTION PANEL
ALT: 49 U/L — ABNORMAL HIGH (ref 0–35)
AST: 33 U/L (ref 0–37)
Albumin: 3.9 g/dL (ref 3.5–5.2)

## 2011-01-22 LAB — RENAL FUNCTION PANEL
Albumin: 3.9 g/dL (ref 3.5–5.2)
Chloride: 100 mEq/L (ref 96–112)
GFR: 89.46 mL/min (ref >60.00)
Phosphorus: 3.6 mg/dL (ref 2.3–4.6)
Potassium: 4.2 mEq/L (ref 3.5–5.1)

## 2011-01-22 LAB — LIPID PANEL
Cholesterol: 233 mg/dL — ABNORMAL HIGH (ref 0–200)
Total CHOL/HDL Ratio: 6
Triglycerides: 220 mg/dL — ABNORMAL HIGH (ref 0.0–149.0)

## 2011-01-22 LAB — CBC WITH DIFFERENTIAL/PLATELET
Basophils Relative: 0.7 % (ref 0.0–3.0)
Eosinophils Relative: 2.8 % (ref 0.0–5.0)
Hemoglobin: 14.1 g/dL (ref 12.0–15.0)
Lymphocytes Relative: 17.8 % (ref 12.0–46.0)
Monocytes Relative: 7 % (ref 3.0–12.0)
Neutro Abs: 6.4 10*3/uL (ref 1.4–7.7)
RBC: 4.37 Mil/uL (ref 3.87–5.11)

## 2011-01-22 LAB — LDL CHOLESTEROL, DIRECT: Direct LDL: 178.3 mg/dL

## 2011-01-26 ENCOUNTER — Ambulatory Visit (INDEPENDENT_AMBULATORY_CARE_PROVIDER_SITE_OTHER): Payer: Medicare Other | Admitting: Family Medicine

## 2011-01-26 ENCOUNTER — Encounter: Payer: Self-pay | Admitting: Family Medicine

## 2011-01-26 DIAGNOSIS — M79671 Pain in right foot: Secondary | ICD-10-CM | POA: Insufficient documentation

## 2011-01-26 DIAGNOSIS — F411 Generalized anxiety disorder: Secondary | ICD-10-CM

## 2011-01-26 DIAGNOSIS — I10 Essential (primary) hypertension: Secondary | ICD-10-CM

## 2011-01-26 DIAGNOSIS — M79609 Pain in unspecified limb: Secondary | ICD-10-CM

## 2011-01-26 DIAGNOSIS — E119 Type 2 diabetes mellitus without complications: Secondary | ICD-10-CM

## 2011-01-26 DIAGNOSIS — E785 Hyperlipidemia, unspecified: Secondary | ICD-10-CM

## 2011-01-26 MED ORDER — ALBUTEROL SULFATE HFA 108 (90 BASE) MCG/ACT IN AERS: 2.0000 | INHALATION_SPRAY | Freq: Four times a day (QID) | RESPIRATORY_TRACT | Status: DC | PRN

## 2011-01-26 MED ORDER — ALPRAZOLAM 0.5 MG PO TABS: 0.5000 mg | ORAL_TABLET | Freq: Every evening | ORAL | Status: DC | PRN

## 2011-01-26 NOTE — Assessment & Plan Note (Signed)
Worse with no time to care for herself Had long disc about diet and exercise Hopes to improve  a1c 3 mo and f/u  Seeing opthy ongoing

## 2011-01-26 NOTE — Patient Instructions (Signed)
Increase your xanax/ alprazolam to .5 mg at night as needed This is sedating and addictive so use caution I strongly feel you need to see a psychological counselor in the future We will do a referral to ortho for your foot at check out  You need to work hard on diet and exercise for diabetes and cholesterol For cholesterol Avoid red meat/ fried foods/ egg yolks/ fatty breakfast meats/ butter, cheese and high fat dairy/ and shellfish   Schedule labs and follow up in 3 months

## 2011-01-26 NOTE — Assessment & Plan Note (Signed)
Fair - not quite at goal - but pt very emotionally upset today and crying Will re check 3 mo  No change in med Rev labs with pt
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
## 2011-01-26 NOTE — Assessment & Plan Note (Signed)
Up with poor diet Rev labs and also low sat fat diet with pt in detail  Will disc poss starting zetia at 3 mo f/u Non tol statins

## 2011-01-26 NOTE — Assessment & Plan Note (Signed)
Old surgical site plantar between 3rd and 4th toes - ? Neuroma Limits walking Tender Ref to ortho for eval
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
## 2011-04-04 ENCOUNTER — Telehealth: Payer: Self-pay | Admitting: *Deleted

## 2011-04-04 NOTE — Telephone Encounter (Signed)
Sorry- I am already triple booked for my 4 pm -- so go ahead and tell her to push the water and schedule with first available tomorrow or adv her to go to urgent care  Thanks

## 2011-04-04 NOTE — Telephone Encounter (Signed)
Cannot do drop off ua -- please put her in my 4:15 if open  Let me know if not

## 2011-04-04 NOTE — Telephone Encounter (Signed)
Will you allow her to submit a urine specimen for analysis and tx?

## 2011-04-04 NOTE — Telephone Encounter (Signed)
You already have [employer] in your 4:15 appt this afternoon for UTI.Please advise.

## 2011-04-04 NOTE — Telephone Encounter (Signed)
Patient notified as instructed by telephone. Pt said she has increased her water and taking cranberry pills and feels better but will schedule appt for AM and if she is better in AM or if she has to go to UC tonight she will call and cancel appt in AM.Pt scheduled appt with Dr Dayton Martes 04/05/11 at 10:15am.

## 2011-04-04 NOTE — Telephone Encounter (Signed)
Patient says she is having UTI sx and there is not an appt available with any MD here today.

## 2011-04-05 ENCOUNTER — Encounter: Payer: Self-pay | Admitting: Family Medicine

## 2011-04-05 ENCOUNTER — Ambulatory Visit (INDEPENDENT_AMBULATORY_CARE_PROVIDER_SITE_OTHER): Payer: Medicare Other | Admitting: Family Medicine

## 2011-04-05 VITALS — BP 120/74 | HR 84 | Temp 97.9°F | Ht 61.0 in | Wt 180.5 lb

## 2011-04-05 DIAGNOSIS — R350 Frequency of micturition: Secondary | ICD-10-CM

## 2011-04-05 LAB — POCT URINALYSIS DIPSTICK
Bilirubin, UA: NEGATIVE
Glucose, UA: NEGATIVE
Nitrite, UA: NEGATIVE
pH, UA: 7

## 2011-04-05 MED ORDER — CIPROFLOXACIN HCL 250 MG PO TABS: 250.0000 mg | ORAL_TABLET | Freq: Two times a day (BID) | ORAL | Status: DC

## 2011-04-05 MED ORDER — PHENAZOPYRIDINE HCL 100 MG PO TABS: 100.0000 mg | ORAL_TABLET | Freq: Three times a day (TID) | ORAL | Status: AC | PRN

## 2011-04-05 NOTE — Progress Notes (Signed)
SUBJECTIVE: Erin Beck is a 70 y.o. female who complains of urinary frequency, urgency and dysuria x 5 days, without flank pain, fever, chills, or abnormal vaginal discharge or bleeding.   Patient Active Problem List  Diagnoses  . DIABETES MELLITUS, TYPE II  . HYPERLIPIDEMIA  . ANXIETY  . HYPERTENSION, BENIGN ESSENTIAL  . ALLERGIC RHINITIS  . OSTEOARTHRITIS  . DIVERTICULITIS, HX OF  . OTITIS MEDIA, LEFT  . GERD  . TOBACCO USE, QUIT  . Right foot pain   Past Medical History  Diagnosis Date  . Anxiety   . Diabetes mellitus     type II  . Diverticulitis     Hx of  . Hyperlipidemia   . Arthritis     OA  . Osteopenia   . GERD (gastroesophageal reflux disease)    Past Surgical History  Procedure Date  . Abdominal hysterectomy   . Cholecystectomy   . Joint replacement   . Knee arthroscopy 1990    chondromalacia    History  Substance Use Topics  . Smoking status: Former Smoker    Quit date: 08/20/2009  . Smokeless tobacco: Not on file  . Alcohol Use:    Family History  Problem Relation Age of Onset  . Diabetes Father   . Diabetes Brother    Allergies  Allergen Reactions  . Aleve Rash    Trouble breathing  . Naproxen Rash    Trouble breathing  . Aspirin     REACTION: burns stomach  . Atorvastatin     REACTION: muscle pain  . Buspar (Buspirone Hcl)     Multiple side eff  . Metformin     REACTION: Diarrhea  . Simvastatin     REACTION: GI side eff and swelling  . Sulfonamide Derivatives     REACTION: reaction not known   Current Outpatient Prescriptions on File Prior to Visit  Medication Sig Dispense Refill  . albuterol (PROAIR HFA) 108 (90 BASE) MCG/ACT inhaler Inhale 2 puffs into the lungs every 6 (six) hours as needed for wheezing.  1 Inhaler  11  . ALPRAZolam (XANAX) 0.5 MG tablet Take 1 tablet (0.5 mg total) by mouth at bedtime as needed for sleep or anxiety.  30 tablet  3  . aspirin 81 MG tablet Take 81 mg by mouth daily.        . Calcium  Carbonate-Vit D-Min 600-400 MG-UNIT TABS Take 2 tablets by mouth daily.        . Flax OIL Take by mouth as directed.        Marland Kitchen glipiZIDE (GLUCOTROL) 5 MG 24 hr tablet TAKE 1 TABLET EVERY DAY  30 tablet  5  . glucose blood test strip Check blood sugar once daily and as needed.       Marland Kitchen glucose monitoring kit (FREESTYLE) monitoring kit Check blood sugar once a day and as needed for diabetes 250.0       . hydrochlorothiazide 25 MG tablet Take 25 mg by mouth daily.        . Lancets (FREESTYLE) lancets Check blood sugar once daily and as needed.       Marland Kitchen lisinopril (PRINIVIL,ZESTRIL) 10 MG tablet Take 10 mg by mouth daily.        . Omega-3 Fatty Acids (FISH OIL PO) Take by mouth as directed.        Marland Kitchen omeprazole (PRILOSEC) 20 MG capsule Take 20 mg by mouth daily.        . niacin 500 MG  tablet Take 500 mg by mouth Nightly.           OBJECTIVE:  BP 120/74  Pulse 84  Temp(Src) 97.9 F (36.6 C) (Oral)  Ht 5\' 1"  (1.549 m)  Wt 180 lb 8 oz (81.874 kg)  BMI 34.11 kg/m2  Appears well, in no apparent distress.  Vital signs are normal. The abdomen is soft without tenderness, guarding, mass, rebound or organomegaly. No CVA tenderness or inguinal adenopathy noted. Urine dipstick shows positive for RBC's, positive for protein and positive for nitrates. ASSESSMENT: UTI uncomplicated without evidence of pyelonephritis  PLAN: Treatment per orders cipro 250 mg twice daily x 5 days, pyridium as needed - also push fluids. . Call or return to clinic prn if these symptoms worsen or fail to improve as anticipated.

## 2011-04-06 ENCOUNTER — Telehealth: Payer: Self-pay | Admitting: Family Medicine

## 2011-04-09 ENCOUNTER — Telehealth: Payer: Self-pay | Admitting: *Deleted

## 2011-04-09 NOTE — Telephone Encounter (Signed)
Called patient on number given by call a nurse and phone continued to ring, no voicemail, and then the call dropped.  Will call back tomorrow.

## 2011-04-09 NOTE — Telephone Encounter (Signed)
Did you see her for this [name]? -- I am confused by the message

## 2011-04-09 NOTE — Telephone Encounter (Signed)
Yes I saw her but I am also confused by the message! I will ask Lowella Bandy call her to find out what is going on.

## 2011-04-09 NOTE — Telephone Encounter (Signed)
[facility] Triage Call Report Triage Record Num: [id number] Operator: Baldomero Lamy Patient Name: Erin Beck Call Date & Time: 04/07/2011 10:28:37AM Patient Phone: [phone] PCP: Ruthe Mannan Patient Gender: Female PCP Fax : [phone] Patient DOB: 12/06/1940 Practice Name: [facility] Reason for Call: Pt called regarding new sxs of rt flank pain and burning with urination. Pt dx with bladder infection on 8/16. Pt started Ciprofloxin on 8/16. Also Phenazopyridine for burning-pt has taken all 6 pills. Pt stated that MD wanter her to call office if no better within 24 hrs. Pt lm at office on 8/17 but states she did not receive a call back. All emergent sxs of Flank pain r/o except Flank pain or low back pain and UTI sxs. See provider within 4 hr disposition. Referred pt to [facility] and gave her the #. Protocol(s) Used: Flank Pain Protocol(s) Used: Urinary Symptoms - Female Recommended Outcome per Protocol: See Provider within 4 hours Reason for Outcome: Flank pain Flank pain or low back pain AND urinary tract symptoms Care Advice: ~ 04/07/2011 10:52:18AM Page 1 of 1 [id number]

## 2011-04-09 NOTE — Telephone Encounter (Signed)
Thanks- let me know what I need to do or have her schedule f/u with me or you

## 2011-04-10 MED ORDER — CIPROFLOXACIN HCL 250 MG PO TABS: 250.0000 mg | ORAL_TABLET | Freq: Two times a day (BID) | ORAL | Status: AC

## 2011-04-10 NOTE — Telephone Encounter (Signed)
Patient stated that she has some burning still upon urination, no back pain, and some pressure but no other symptoms.  She took the last pill last night of Cipro.  She will call back and schedule an appt if need be but she is going out of town today until Friday.

## 2011-04-10 NOTE — Telephone Encounter (Signed)
Patient advised as instructed via telephone.  Rx sent to pharmacy.

## 2011-04-10 NOTE — Telephone Encounter (Signed)
Ok please send in 3 more days of cipro- 250 mg twice daily, #6, no refills to her pharmacy. Please keep [place] posted with her symptoms.

## 2011-04-26 ENCOUNTER — Other Ambulatory Visit (INDEPENDENT_AMBULATORY_CARE_PROVIDER_SITE_OTHER): Payer: Medicare Other

## 2011-04-26 DIAGNOSIS — E119 Type 2 diabetes mellitus without complications: Secondary | ICD-10-CM

## 2011-05-02 ENCOUNTER — Ambulatory Visit (INDEPENDENT_AMBULATORY_CARE_PROVIDER_SITE_OTHER): Payer: Medicare Other | Admitting: Family Medicine

## 2011-05-02 ENCOUNTER — Encounter: Payer: Self-pay | Admitting: Family Medicine

## 2011-05-02 VITALS — BP 116/68 | HR 76 | Temp 98.0°F | Ht 61.0 in | Wt 180.5 lb

## 2011-05-02 DIAGNOSIS — Z23 Encounter for immunization: Secondary | ICD-10-CM

## 2011-05-02 DIAGNOSIS — I10 Essential (primary) hypertension: Secondary | ICD-10-CM

## 2011-05-02 DIAGNOSIS — E119 Type 2 diabetes mellitus without complications: Secondary | ICD-10-CM

## 2011-05-02 DIAGNOSIS — K219 Gastro-esophageal reflux disease without esophagitis: Secondary | ICD-10-CM

## 2011-05-02 DIAGNOSIS — F411 Generalized anxiety disorder: Secondary | ICD-10-CM

## 2011-05-02 DIAGNOSIS — E785 Hyperlipidemia, unspecified: Secondary | ICD-10-CM

## 2011-05-02 MED ORDER — OMEPRAZOLE 20 MG PO CPDR: 20.0000 mg | DELAYED_RELEASE_CAPSULE | Freq: Every day | ORAL | Status: DC

## 2011-05-02 NOTE — Assessment & Plan Note (Signed)
Refilled omeprazole today Doing very well with that - big improvement/ symptom free

## 2011-05-02 NOTE — Progress Notes (Signed)
Subjective:    Patient ID: Erin Beck, female    DOB: [birth year], 70 y.o.   MRN: [id number]  HPI Here for f/u of DM and HTN and  anxiety  Is doing  Better overall - good outlook   HTN in better control today 116/68 No cp or ha or palpitations  Wt is stable with bmi of 34  DM is improved with a1c 6.6 down from 7 On glucotrol and diet - is back to taking care of herself and eating a DM diet  Sees opthy frequently- had cataract removed and now has -- "hole behind her eye"=- ? Retinal  Sees Dr Luciana Axe  Is like a floater- is irritating   Anx/ stress rxn  Takes xanax at night  Still has some problems sleeping - xanax helps when she needs it  Better coping skills - positive outlook now  Stress is a little - dealing with it better   Would like to get her flu shot today utd on other imms   Patient Active Problem List  Diagnoses  . DIABETES MELLITUS, TYPE II  . HYPERLIPIDEMIA  . ANXIETY  . HYPERTENSION, BENIGN ESSENTIAL  . ALLERGIC RHINITIS  . OSTEOARTHRITIS  . DIVERTICULITIS, HX OF  . OTITIS MEDIA, LEFT  . GERD  . TOBACCO USE, QUIT  . Right foot pain   Past Medical History  Diagnosis Date  . Anxiety   . Diabetes mellitus     type II  . Diverticulitis     Hx of  . Hyperlipidemia   . Arthritis     OA  . Osteopenia   . GERD (gastroesophageal reflux disease)    Past Surgical History  Procedure Date  . Abdominal hysterectomy   . Cholecystectomy   . Joint replacement   . Knee arthroscopy 1990    chondromalacia    History  Substance Use Topics  . Smoking status: Former Smoker    Quit date: 08/20/2009  . Smokeless tobacco: Not on file  . Alcohol Use:    Family History  Problem Relation Age of Onset  . Diabetes Father   . Diabetes Brother    Allergies  Allergen Reactions  . All Day Pain Rash    Trouble breathing  . Naproxen Rash    Trouble breathing  . Aspirin     REACTION: burns stomach  . Atorvastatin     REACTION: muscle pain  . Buspar  (Buspirone Hcl)     Multiple side eff  . Metformin     REACTION: Diarrhea  . Simvastatin     REACTION: GI side eff and swelling  . Sulfa Antibiotics Other (See Comments)    Severe joint pain  . Sulfonamide Derivatives     REACTION: reaction not known   Current Outpatient Prescriptions on File Prior to Visit  Medication Sig Dispense Refill  . ALPRAZolam (XANAX) 0.5 MG tablet Take 1 tablet (0.5 mg total) by mouth at bedtime as needed for sleep or anxiety.  30 tablet  3  . aspirin 81 MG tablet Take 81 mg by mouth daily.        . Calcium Carbonate-Vit D-Min 600-400 MG-UNIT TABS Take 2 tablets by mouth daily.        . Flax OIL Take by mouth as directed.        Marland Kitchen glipiZIDE (GLUCOTROL) 5 MG 24 hr tablet TAKE 1 TABLET EVERY DAY  30 tablet  5  . glucose blood test strip Check blood sugar once daily and as  needed.       Marland Kitchen glucose monitoring kit (FREESTYLE) monitoring kit Check blood sugar once a day and as needed for diabetes 250.0       . hydrochlorothiazide 25 MG tablet Take 25 mg by mouth daily.        . Lancets (FREESTYLE) lancets Check blood sugar once daily and as needed.       Marland Kitchen lisinopril (PRINIVIL,ZESTRIL) 10 MG tablet Take 10 mg by mouth daily.        . Omega-3 Fatty Acids (FISH OIL PO) Take by mouth as directed.        Marland Kitchen albuterol (PROAIR HFA) 108 (90 BASE) MCG/ACT inhaler Inhale 2 puffs into the lungs every 6 (six) hours as needed for wheezing.  1 Inhaler  11  . niacin 500 MG tablet Take 500 mg by mouth Nightly.        . vitamin A 7500 UNIT capsule Take 7,500 Units by mouth daily.           Review of Systems Review of Systems  Constitutional: Negative for fever, appetite change, fatigue and unexpected weight change.  Eyes: Negative for pain and visual disturbance.  Respiratory: Negative for cough and shortness of breath.   Cardiovascular: Negative for cp or palpitations    Gastrointestinal: Negative for nausea, diarrhea and constipation.  Genitourinary: Negative for urgency and  frequency.  Skin: Negative for pallor or rash   Neurological: Negative for weakness, light-headedness, numbness and headaches.  Hematological: Negative for adenopathy. Does not bruise/bleed easily.  Psychiatric/Behavioral: Negative for dysphoric mood.anxiety is improved and outlook better         Objective:   Physical Exam  Constitutional: She appears well-developed and well-nourished. No distress.       overwt and well appearing   HENT:  Head: Normocephalic and atraumatic.  Mouth/Throat: Oropharynx is clear and moist.  Eyes: Conjunctivae and EOM are normal. Pupils are equal, round, and reactive to light.  Neck: Normal range of motion. Neck supple. No JVD present. Carotid bruit is not present. No thyromegaly present.  Cardiovascular: Normal rate, regular rhythm, normal heart sounds and intact distal pulses.   Pulmonary/Chest: Effort normal and breath sounds normal. No respiratory distress. She has no wheezes. She exhibits no tenderness.  Abdominal: Soft. Bowel sounds are normal. She exhibits no distension, no abdominal bruit and no mass. There is no tenderness.  Musculoskeletal: Normal range of motion. She exhibits no edema and no tenderness.  Lymphadenopathy:    She has no cervical adenopathy.  Neurological: She is alert. She has normal reflexes. No cranial nerve deficit. Coordination normal.  Skin: Skin is warm and dry. No rash noted. No erythema. No pallor.  Psychiatric: She has a normal mood and affect.       Much improved affect Bright and not anxious today          Assessment & Plan:

## 2011-05-02 NOTE — Assessment & Plan Note (Signed)
a1c down to 6.6 with better diet and glucotrol  utd opthy- goes frequently Rev low glycemic diet / need for exercise and wt loss

## 2011-05-02 NOTE — Assessment & Plan Note (Signed)
Improved anxiety and attitude Sleeping better Using xanax prn  Will keep me updated if she wants counseling

## 2011-05-02 NOTE — Assessment & Plan Note (Signed)
Intol of all meds so far Rev low sat fat diet Will check with 6 mo labs

## 2011-05-02 NOTE — Patient Instructions (Signed)
No change in medicines  Keep working on diet and try to add exercise 5 days per week  Flu shot today Follow up for annual exam 30 min in months with labs prior

## 2011-05-02 NOTE — Assessment & Plan Note (Signed)
Better bp with less stress today  No change in therapy- tolerates ace well Disc exercise

## 2011-06-25 ENCOUNTER — Other Ambulatory Visit: Payer: Self-pay

## 2011-06-25 MED ORDER — GLIPIZIDE ER 5 MG PO TB24: 5.0000 mg | ORAL_TABLET | Freq: Every day | ORAL | Status: DC

## 2011-06-25 NOTE — Telephone Encounter (Signed)
[facility] faxed refill request Glipizide ER 5  Mg # 30 x 5.

## 2011-07-09 ENCOUNTER — Other Ambulatory Visit: Payer: Self-pay

## 2011-07-09 MED ORDER — LISINOPRIL 10 MG PO TABS: 10.0000 mg | ORAL_TABLET | Freq: Every day | ORAL | Status: DC

## 2011-07-09 MED ORDER — HYDROCHLOROTHIAZIDE 25 MG PO TABS: 25.0000 mg | ORAL_TABLET | Freq: Every day | ORAL | Status: DC

## 2011-07-09 NOTE — Telephone Encounter (Signed)
[facility] faxed refill request HCTZ 25 mg #90 x 1 and LIsinopril 10 mg #90 x 1. Pt already scheduled CPX 10/31/11.

## 2011-07-30 ENCOUNTER — Other Ambulatory Visit: Payer: Self-pay | Admitting: Family Medicine

## 2011-08-01 ENCOUNTER — Ambulatory Visit (INDEPENDENT_AMBULATORY_CARE_PROVIDER_SITE_OTHER): Payer: Medicare Other | Admitting: Family Medicine

## 2011-08-01 ENCOUNTER — Encounter: Payer: Self-pay | Admitting: Family Medicine

## 2011-08-01 VITALS — BP 122/78 | HR 64 | Temp 97.6°F | Ht 61.0 in | Wt 178.2 lb

## 2011-08-01 DIAGNOSIS — R109 Unspecified abdominal pain: Secondary | ICD-10-CM

## 2011-08-01 DIAGNOSIS — R1032 Left lower quadrant pain: Secondary | ICD-10-CM

## 2011-08-01 LAB — POCT URINALYSIS DIPSTICK
Glucose, UA: NEGATIVE
Leukocytes, UA: NEGATIVE
Nitrite, UA: NEGATIVE
Urobilinogen, UA: 0.2
pH, UA: 7

## 2011-08-01 MED ORDER — CIPROFLOXACIN HCL 500 MG PO TABS: 500.0000 mg | ORAL_TABLET | Freq: Two times a day (BID) | ORAL | Status: DC

## 2011-08-01 MED ORDER — METRONIDAZOLE 500 MG PO TABS: 500.0000 mg | ORAL_TABLET | Freq: Two times a day (BID) | ORAL | Status: AC

## 2011-08-01 NOTE — Patient Instructions (Signed)
Good to see you. Please take your antibiotics as directed. Call us immediately if your pain worsens or you develop increased fever, nausea or vomiting. Follow up with Dr. Milinda Antis in the next 1-2 weeks.  Diverticulitis A diverticulum is a small pouch or sac on the colon. Diverticulosis is the presence of these diverticula on the colon. Diverticulitis is the irritation (inflammation) or infection of diverticula. CAUSES  The colon and its diverticula contain bacteria. If food particles block the tiny opening to a diverticulum, the bacteria inside can grow and cause an increase in pressure. This leads to infection and inflammation and is called diverticulitis. SYMPTOMS   Abdominal pain and tenderness. Usually, the pain is located on the left side of your abdomen. However, it could be located elsewhere.   Fever.   Bloating.   Feeling sick to your stomach (nausea).   Throwing up (vomiting).   Abnormal stools.  DIAGNOSIS  Your caregiver will take a history and perform a physical exam. Since many things can cause abdominal pain, other tests may be necessary. Tests may include:  Blood tests.   Urine tests.   X-ray of the abdomen.   CT scan of the abdomen.  Sometimes, surgery is needed to determine if diverticulitis or other conditions are causing your symptoms. TREATMENT  Most of the time, you can be treated without surgery. Treatment includes:  Resting the bowels by only having liquids for a few days. As you improve, you will need to eat a low-fiber diet.   Intravenous (IV) fluids if you are losing body fluids (dehydrated).   Antibiotic medicines that treat infections may be given.   Pain and nausea medicine, if needed.   Surgery if the inflamed diverticulum has burst.  HOME CARE INSTRUCTIONS   Try a clear liquid diet (broth, tea, or water for as long as directed by your caregiver). You may then gradually begin a low-fiber diet as tolerated. A low-fiber diet is a diet with less  than 10 grams of fiber. Choose the foods below to reduce fiber in the diet:   White breads, cereals, rice, and pasta.   Cooked fruits and vegetables or soft fresh fruits and vegetables without the skin.   Ground or well-cooked tender beef, ham, veal, lamb, pork, or poultry.   Eggs and seafood.   After your diverticulitis symptoms have improved, your caregiver may put you on a high-fiber diet. A high-fiber diet includes 14 grams of fiber for every 1000 calories consumed. For a standard 2000 calorie diet, you would need 28 grams of fiber. Follow these diet guidelines to help you increase the fiber in your diet. It is important to slowly increase the amount fiber in your diet to avoid gas, constipation, and bloating.   Choose whole-grain breads, cereals, pasta, and brown rice.   Choose fresh fruits and vegetables with the skin on. Do not overcook vegetables because the more vegetables are cooked, the more fiber is lost.   Choose more nuts, seeds, legumes, dried peas, beans, and lentils.   Look for food products that have greater than 3 grams of fiber per serving on the Nutrition Facts label.   Take all medicine as directed by your caregiver.   If your caregiver has given you a follow-up appointment, it is very important that you go. Not going could result in lasting (chronic) or permanent injury, pain, and disability. If there is any problem keeping the appointment, call to reschedule.  SEEK MEDICAL CARE IF:   Your pain does not  improve.   You have a hard time advancing your diet beyond clear liquids.   Your bowel movements do not return to normal.  SEEK IMMEDIATE MEDICAL CARE IF:   Your pain becomes worse.   You have an oral temperature above 102 F (38.9 C), not controlled by medicine.   You have repeated vomiting.   You have bloody or black, tarry stools.   Symptoms that brought you to your caregiver become worse or are not getting better.  MAKE SURE YOU:   Understand  these instructions.   Will watch your condition.   Will get help right away if you are not doing well or get worse.  Document Released: 05/16/2005 Document Revised: 04/18/2011 Document Reviewed: 09/11/2010 [facility] Patient Information 2012 [place], [place].

## 2011-08-01 NOTE — Progress Notes (Signed)
Subjective:    Patient ID: Erin Beck, female    DOB: 08-20-1941, 70 y.o.   MRN: [id number]  HPI  70 yo pt of Dr. Milinda Antis here for several days of persistent left lower quadrant pain, diarrhea and fever Tmax 99.8. H/o diverticulitis and reports these symptoms are very typical to her usual attacks. Has not seen much blood in stool yet but typically will if symptoms continue.  Eating and drinking ok. Currently afebrile. No nausea, vomiting or diarrhea.  Note reviewed- CT scan in 2005- pos for mild diverticulitis.  No dysuria, increased urinary frequency, or hematuria.   Patient Active Problem List  Diagnoses  . DIABETES MELLITUS, TYPE II  . HYPERLIPIDEMIA  . ANXIETY  . HYPERTENSION, BENIGN ESSENTIAL  . ALLERGIC RHINITIS  . OSTEOARTHRITIS  . DIVERTICULITIS, HX OF  . GERD  . TOBACCO USE, QUIT   Past Medical History  Diagnosis Date  . Anxiety   . Diabetes mellitus     type II  . Diverticulitis     Hx of  . Hyperlipidemia   . Arthritis     OA  . Osteopenia   . GERD (gastroesophageal reflux disease)    Past Surgical History  Procedure Date  . Abdominal hysterectomy   . Cholecystectomy   . Joint replacement   . Knee arthroscopy 1990    chondromalacia    History  Substance Use Topics  . Smoking status: Former Smoker    Quit date: 08/20/2009  . Smokeless tobacco: Not on file  . Alcohol Use:    Family History  Problem Relation Age of Onset  . Diabetes Father   . Diabetes Brother    Allergies  Allergen Reactions  . Aleve Rash    Trouble breathing  . Naproxen Rash    Trouble breathing  . Aspirin     REACTION: burns stomach  . Atorvastatin     REACTION: muscle pain  . Buspar (Buspirone Hcl)     Multiple side eff  . Metformin     REACTION: Diarrhea  . Simvastatin     REACTION: GI side eff and swelling  . Sulfa Antibiotics Other (See Comments)    Severe joint pain  . Sulfonamide Derivatives     REACTION: reaction not known   Current Outpatient  Prescriptions on File Prior to Visit  Medication Sig Dispense Refill  . albuterol (PROAIR HFA) 108 (90 BASE) MCG/ACT inhaler Inhale 2 puffs into the lungs every 6 (six) hours as needed for wheezing.  1 Inhaler  11  . ALPRAZolam (XANAX) 0.5 MG tablet Take 1 tablet (0.5 mg total) by mouth at bedtime as needed for sleep or anxiety.  30 tablet  3  . aspirin 81 MG tablet Take 81 mg by mouth daily.        . Calcium Carbonate-Vit D-Min 600-400 MG-UNIT TABS Take 2 tablets by mouth daily.        . Flax OIL Take by mouth as directed.        Marland Kitchen glipiZIDE (GLUCOTROL XL) 5 MG 24 hr tablet Take 1 tablet (5 mg total) by mouth daily.  30 tablet  5  . glucose blood test strip Check blood sugar once daily and as needed.       Marland Kitchen glucose monitoring kit (FREESTYLE) monitoring kit Check blood sugar once a day and as needed for diabetes 250.0       . hydrochlorothiazide (HYDRODIURIL) 25 MG tablet Take 1 tablet (25 mg total) by mouth daily.  90  tablet  1  . Lancets (FREESTYLE) lancets Check blood sugar once daily and as needed.       Marland Kitchen lisinopril (PRINIVIL,ZESTRIL) 10 MG tablet Take 1 tablet (10 mg total) by mouth daily.  90 tablet  1  . niacin 500 MG tablet Take 500 mg by mouth Nightly.        . Omega-3 Fatty Acids (FISH OIL PO) Take by mouth as directed.        Marland Kitchen omeprazole (PRILOSEC) 20 MG capsule TAKE ONE CAPSULE BY MOUTH EVERY MORNING FOR ACID REFLUX  30 capsule  11  . VITAMIN A PO Take 900 Units by mouth daily.           Review of Systems See HPI       Objective:   Physical Exam  BP 122/78  Pulse 64  Temp(Src) 97.6 F (36.4 C) (Oral)  Ht 5\' 1"  (1.549 m)  Wt 178 lb 4 oz (80.854 kg)  BMI 33.68 kg/m2  Constitutional: She appears well-developed and well-nourished. No distress.    Abdominal: Soft. Bowel sounds are normal. Mild tenderness over LLQ, no rebound, no guarding.  No CVA tenderness Neurological: She is alert. . Coordination normal.  Skin: Skin is warm and dry. No rash noted. No erythema. No  pallor.  Psychiatric: She has a normal mood and affect.        Assessment & Plan:   1. LLQ pain     New.  Non toxic, eating and drinking normally. Consistent with diverticular pain. UA pos for blood- will send for cx. Treat with 10 day course of cipro and flagyl- should cover both UTI and diverticulitis. Follow up with PCP in next 1-2 weeks.  Will need repeat UA to make sure microhematuria has cleared. Would also recommend GI follow up.

## 2011-08-10 ENCOUNTER — Encounter: Payer: Self-pay | Admitting: Family Medicine

## 2011-08-10 ENCOUNTER — Ambulatory Visit (INDEPENDENT_AMBULATORY_CARE_PROVIDER_SITE_OTHER): Payer: Medicare Other | Admitting: Family Medicine

## 2011-08-10 VITALS — BP 124/76 | HR 88 | Temp 97.4°F | Wt 182.0 lb

## 2011-08-10 DIAGNOSIS — K589 Irritable bowel syndrome without diarrhea: Secondary | ICD-10-CM

## 2011-08-10 DIAGNOSIS — N390 Urinary tract infection, site not specified: Secondary | ICD-10-CM

## 2011-08-10 DIAGNOSIS — R319 Hematuria, unspecified: Secondary | ICD-10-CM

## 2011-08-10 DIAGNOSIS — Z8719 Personal history of other diseases of the digestive system: Secondary | ICD-10-CM

## 2011-08-10 LAB — POCT URINALYSIS DIPSTICK
Bilirubin, UA: NEGATIVE
Blood, UA: NEGATIVE
Ketones, UA: NEGATIVE
pH, UA: 6.5

## 2011-08-10 NOTE — Assessment & Plan Note (Signed)
This is much improved - finishing cipro and flagyl Disc high fiber diet  If symptoms return will update

## 2011-08-10 NOTE — Progress Notes (Signed)
Subjective:    Patient ID: Erin Beck, female    DOB: 11-01-1940, 70 y.o.   MRN: [id number]  HPI Here for f/u of diverticulitis and hematuria on last ua  Is better overall   tx with cipro and flagyl- has one dose left  Had LLQ pain  Wt is up 4 lb since then with bmi of 34 Feels a lot better - no fever , and no abdominal pain except for occ cramp  Does have chronic loose stools - no changes in that   Has never had GI consult utd colonosc    DM in good control  bp fine 124/76  ua repeated - no longer has any blood  No symptoms at all   Patient Active Problem List  Diagnoses  . DIABETES MELLITUS, TYPE II  . HYPERLIPIDEMIA  . ANXIETY  . HYPERTENSION, BENIGN ESSENTIAL  . ALLERGIC RHINITIS  . OSTEOARTHRITIS  . DIVERTICULITIS, HX OF  . GERD  . TOBACCO USE, QUIT  . LLQ pain  . IBS (irritable bowel syndrome)  . Hematuria   Past Medical History  Diagnosis Date  . Anxiety   . Diabetes mellitus     type II  . Diverticulitis     Hx of  . Hyperlipidemia   . Arthritis     OA  . Osteopenia   . GERD (gastroesophageal reflux disease)    Past Surgical History  Procedure Date  . Abdominal hysterectomy   . Cholecystectomy   . Joint replacement   . Knee arthroscopy 1990    chondromalacia    History  Substance Use Topics  . Smoking status: Former Smoker    Quit date: 08/20/2009  . Smokeless tobacco: Not on file  . Alcohol Use:    Family History  Problem Relation Age of Onset  . Diabetes Father   . Diabetes Brother    Allergies  Allergen Reactions  . Aleve Rash    Trouble breathing  . Naproxen Rash    Trouble breathing  . Aspirin     REACTION: burns stomach  . Atorvastatin     REACTION: muscle pain  . Buspar (Buspirone Hcl)     Multiple side eff  . Metformin     REACTION: Diarrhea  . Simvastatin     REACTION: GI side eff and swelling  . Sulfa Antibiotics Other (See Comments)    Severe joint pain  . Sulfonamide Derivatives     REACTION:  reaction not known   Current Outpatient Prescriptions on File Prior to Visit  Medication Sig Dispense Refill  . albuterol (PROAIR HFA) 108 (90 BASE) MCG/ACT inhaler Inhale 2 puffs into the lungs every 6 (six) hours as needed for wheezing.  1 Inhaler  11  . ALPRAZolam (XANAX) 0.5 MG tablet Take 1 tablet (0.5 mg total) by mouth at bedtime as needed for sleep or anxiety.  30 tablet  3  . aspirin 81 MG tablet Take 81 mg by mouth daily.        . Calcium Carbonate-Vit D-Min 600-400 MG-UNIT TABS Take 2 tablets by mouth daily.        . ciprofloxacin (CIPRO) 500 MG tablet Take 1 tablet (500 mg total) by mouth 2 (two) times daily.  20 tablet  0  . Flax OIL Take by mouth as directed.        Marland Kitchen glipiZIDE (GLUCOTROL XL) 5 MG 24 hr tablet Take 1 tablet (5 mg total) by mouth daily.  30 tablet  5  . glucose  blood test strip Check blood sugar once daily and as needed.       Marland Kitchen glucose monitoring kit (FREESTYLE) monitoring kit Check blood sugar once a day and as needed for diabetes 250.0       . hydrochlorothiazide (HYDRODIURIL) 25 MG tablet Take 1 tablet (25 mg total) by mouth daily.  90 tablet  1  . Lancets (FREESTYLE) lancets Check blood sugar once daily and as needed.       Marland Kitchen lisinopril (PRINIVIL,ZESTRIL) 10 MG tablet Take 1 tablet (10 mg total) by mouth daily.  90 tablet  1  . metroNIDAZOLE (FLAGYL) 500 MG tablet Take 1 tablet (500 mg total) by mouth 2 (two) times daily.  20 tablet  0  . niacin 500 MG tablet Take 500 mg by mouth Nightly.        . Omega-3 Fatty Acids (FISH OIL PO) Take by mouth as directed.        Marland Kitchen omeprazole (PRILOSEC) 20 MG capsule TAKE ONE CAPSULE BY MOUTH EVERY MORNING FOR ACID REFLUX  30 capsule  11  . VITAMIN A PO Take 900 Units by mouth daily.            Review of Systems Review of Systems  Constitutional: Negative for fever, appetite change, fatigue and unexpected weight change.  Eyes: Negative for pain and visual disturbance.  Respiratory: Negative for cough and shortness of  breath.   Cardiovascular: Negative for cp or palpitations    Gastrointestinal: Negative for nausea, diarrhea and constipation. abd pain is better  Genitourinary: Negative for urgency and frequency. neg for blood in urine or back pain Skin: Negative for pallor or rash   Neurological: Negative for weakness, light-headedness, numbness and headaches.  Hematological: Negative for adenopathy. Does not bruise/bleed easily.  Psychiatric/Behavioral: Negative for dysphoric mood. The patient is not nervous/anxious.          Objective:   Physical Exam  Constitutional: She appears well-developed and well-nourished. No distress.       overwt and well appearing   HENT:  Head: Normocephalic and atraumatic.  Mouth/Throat: Oropharynx is clear and moist.  Eyes: Conjunctivae and EOM are normal. Pupils are equal, round, and reactive to light. No scleral icterus.  Neck: Normal range of motion. Neck supple. No JVD present. No thyromegaly present.  Cardiovascular: Normal rate, regular rhythm and normal heart sounds.   Pulmonary/Chest: Effort normal and breath sounds normal. No respiratory distress. She has no wheezes.  Abdominal: Soft. Bowel sounds are normal. She exhibits no distension and no mass. There is tenderness. There is no rebound and no guarding.       Very slight tenderness in LLQ to deep palp only No rebound Per pt much improved  Musculoskeletal: She exhibits no edema.       No cva tenderness  Lymphadenopathy:    She has no cervical adenopathy.  Neurological: She is alert. She has normal reflexes. No cranial nerve deficit.  Skin: Skin is warm and dry. No rash noted. No erythema. No pallor.  Psychiatric: She has a normal mood and affect.          Assessment & Plan:

## 2011-08-10 NOTE — Assessment & Plan Note (Signed)
Disc chronic loose stool Given handouts on IBS  Will work on more fiber in diet and update  utd colonosc

## 2011-08-10 NOTE — Assessment & Plan Note (Signed)
Incidental micro hematuria last visit - no symptoms This is resolved today Will update if any urinary symptoms

## 2011-08-10 NOTE — Patient Instructions (Signed)
Finish antibiotics- I'm glad you are doing better  If symptoms return/ let me know  Try fiber supplement for irritable bowel syndrome --- citrucel or metamucil once daily  Urine is clear today

## 2011-08-21 HISTORY — PX: JOINT REPLACEMENT: SHX530

## 2011-09-26 ENCOUNTER — Encounter: Payer: Self-pay | Admitting: Internal Medicine

## 2011-10-04 ENCOUNTER — Encounter: Payer: Self-pay | Admitting: Family Medicine

## 2011-10-04 ENCOUNTER — Ambulatory Visit (INDEPENDENT_AMBULATORY_CARE_PROVIDER_SITE_OTHER): Payer: Medicare Other | Admitting: Family Medicine

## 2011-10-04 VITALS — BP 120/60 | HR 91 | Temp 97.8°F | Ht 61.0 in | Wt 183.8 lb

## 2011-10-04 DIAGNOSIS — Z87891 Personal history of nicotine dependence: Secondary | ICD-10-CM

## 2011-10-04 DIAGNOSIS — J209 Acute bronchitis, unspecified: Secondary | ICD-10-CM

## 2011-10-04 MED ORDER — AMOXICILLIN 500 MG PO CAPS: 1000.0000 mg | ORAL_CAPSULE | Freq: Two times a day (BID) | ORAL | Status: AC

## 2011-10-04 NOTE — Progress Notes (Signed)
  Patient Name: Erin Beck Date of Birth: [birth year] Medical Record Number: [id number] Gender: female Date of Encounter: 10/04/2011  History of Present Illness:  Erin Beck is a 71 y.o. very pleasant female patient who presents with the following:  Tuesday -- back of throat, sinus, congestion. Coughing up a lot also. Was a distant smoker, quit but 50 years smoking.  Asthma, uses inhaler prn. Now some mild trouble breathing, terrible cough Some nasal drainage and congestion   Patient Active Problem List  Diagnoses  . DIABETES MELLITUS, TYPE II  . HYPERLIPIDEMIA  . ANXIETY  . HYPERTENSION, BENIGN ESSENTIAL  . ALLERGIC RHINITIS  . OSTEOARTHRITIS  . DIVERTICULITIS, HX OF  . GERD  . TOBACCO USE, QUIT  . LLQ pain  . IBS (irritable bowel syndrome)  . Hematuria   Past Medical History  Diagnosis Date  . Anxiety   . Diabetes mellitus     type II  . Diverticulitis     Hx of  . Hyperlipidemia   . Arthritis     OA  . Osteopenia   . GERD (gastroesophageal reflux disease)    Past Surgical History  Procedure Date  . Abdominal hysterectomy   . Cholecystectomy   . Joint replacement   . Knee arthroscopy 1990    chondromalacia    History  Substance Use Topics  . Smoking status: Former Smoker    Quit date: 08/20/2009  . Smokeless tobacco: Not on file  . Alcohol Use:    Family History  Problem Relation Age of Onset  . Diabetes Father   . Diabetes Brother    Allergies  Allergen Reactions  . Aleve Rash    Trouble breathing  . Naproxen Rash    Trouble breathing  . Aspirin     REACTION: burns stomach  . Atorvastatin     REACTION: muscle pain  . Buspar (Buspirone Hcl)     Multiple side eff  . Metformin     REACTION: Diarrhea  . Simvastatin     REACTION: GI side eff and swelling  . Sulfa Antibiotics Other (See Comments)    Severe joint pain  . Sulfonamide Derivatives     REACTION: reaction not known    Medication list has been reviewed and  updated.  Review of Systems: ROS: GEN: Acute illness details above GI: Tolerating PO intake GU: maintaining adequate hydration and urination Pulm: No SOB Interactive and getting along well at home.  Otherwise, ROS is as per the HPI.   Physical Examination: Filed Vitals:   10/04/11 0944  BP: 120/60  Pulse: 91  Temp: 97.8 F (36.6 C)  TempSrc: Oral  Height: 5\' 1"  (1.549 m)  Weight: 183 lb 12.8 oz (83.371 kg)  SpO2: 95%    Body mass index is 34.73 kg/(m^2).   GEN: A and O x 3. WDWN. NAD.    ENT: Nose clear, ext NML.  No LAD.  No JVD.  TM's clear. Oropharynx clear.  PULM: Normal WOB, no distress. No crackles, wheezes, rhonchi. CV: RRR, no M/G/R, No rubs, No JVD.   EXT: warm and well-perfused, No c/c/e. PSYCH: Pleasant and conversant.   Assessment and Plan: 1. Acute bronchitis  amoxicillin (AMOXIL) 500 MG capsule    Acute bronchitis: discussed plan of care. Given 50+ pack year history, will treat with ABX and ventolin prn in this case. Continue with additional supportive care, cough medications, liquids, sleep, steam / vaporizer.

## 2011-10-05 ENCOUNTER — Ambulatory Visit: Payer: Medicare Other | Admitting: Family Medicine

## 2011-10-23 ENCOUNTER — Telehealth: Payer: Self-pay | Admitting: Family Medicine

## 2011-10-23 DIAGNOSIS — E785 Hyperlipidemia, unspecified: Secondary | ICD-10-CM

## 2011-10-23 DIAGNOSIS — E119 Type 2 diabetes mellitus without complications: Secondary | ICD-10-CM

## 2011-10-23 DIAGNOSIS — I10 Essential (primary) hypertension: Secondary | ICD-10-CM

## 2011-10-23 DIAGNOSIS — K219 Gastro-esophageal reflux disease without esophagitis: Secondary | ICD-10-CM

## 2011-10-23 NOTE — Telephone Encounter (Signed)
Message copied by Judy Pimple on Tue Oct 23, 2011  8:44 PM ------      Message from: Baldomero Lamy      Created: Thu Oct 18, 2011  8:55 AM      Regarding: Cpx labs Wed 10/24/11       Please order  future cpx labs for pt's upcomming lab appt.      Thanks      Rodney Booze

## 2011-10-24 ENCOUNTER — Other Ambulatory Visit (INDEPENDENT_AMBULATORY_CARE_PROVIDER_SITE_OTHER): Payer: Medicare Other

## 2011-10-24 DIAGNOSIS — E785 Hyperlipidemia, unspecified: Secondary | ICD-10-CM

## 2011-10-24 DIAGNOSIS — I10 Essential (primary) hypertension: Secondary | ICD-10-CM

## 2011-10-24 DIAGNOSIS — E119 Type 2 diabetes mellitus without complications: Secondary | ICD-10-CM

## 2011-10-24 DIAGNOSIS — K219 Gastro-esophageal reflux disease without esophagitis: Secondary | ICD-10-CM

## 2011-10-24 LAB — COMPREHENSIVE METABOLIC PANEL
CO2: 29 mEq/L (ref 19–32)
Creatinine, Ser: 0.8 mg/dL (ref 0.4–1.2)
GFR: 79.85 mL/min (ref >60.00)
Glucose, Bld: 145 mg/dL — ABNORMAL HIGH (ref 70–99)
Total Bilirubin: 0.3 mg/dL (ref 0.3–1.2)

## 2011-10-24 LAB — LDL CHOLESTEROL, DIRECT: Direct LDL: 156.2 mg/dL

## 2011-10-24 LAB — LIPID PANEL
Cholesterol: 238 mg/dL — ABNORMAL HIGH (ref 0–200)
HDL: 44.1 mg/dL (ref >39.00)
Triglycerides: 274 mg/dL — ABNORMAL HIGH (ref 0.0–149.0)

## 2011-10-24 LAB — CBC WITH DIFFERENTIAL/PLATELET
Eosinophils Relative: 3 % (ref 0.0–5.0)
HCT: 42.6 % (ref 36.0–46.0)
Hemoglobin: 14.6 g/dL (ref 12.0–15.0)
Lymphs Abs: 2 10*3/uL (ref 0.7–4.0)
MCV: 93.2 fl (ref 78.0–100.0)
Monocytes Relative: 7 % (ref 3.0–12.0)
Neutro Abs: 6.4 10*3/uL (ref 1.4–7.7)
RDW: 12.9 % (ref 11.5–14.6)
WBC: 9.4 10*3/uL (ref 4.5–10.5)

## 2011-10-24 LAB — HEMOGLOBIN A1C: Hgb A1c MFr Bld: 7.2 % — ABNORMAL HIGH (ref 4.6–6.5)

## 2011-10-31 ENCOUNTER — Ambulatory Visit (INDEPENDENT_AMBULATORY_CARE_PROVIDER_SITE_OTHER): Payer: Medicare Other | Admitting: Family Medicine

## 2011-10-31 ENCOUNTER — Encounter: Payer: Self-pay | Admitting: Family Medicine

## 2011-10-31 VITALS — BP 114/70 | HR 92 | Temp 97.5°F | Ht 61.0 in | Wt 187.2 lb

## 2011-10-31 DIAGNOSIS — I10 Essential (primary) hypertension: Secondary | ICD-10-CM

## 2011-10-31 DIAGNOSIS — E785 Hyperlipidemia, unspecified: Secondary | ICD-10-CM

## 2011-10-31 DIAGNOSIS — Z1231 Encounter for screening mammogram for malignant neoplasm of breast: Secondary | ICD-10-CM

## 2011-10-31 DIAGNOSIS — E669 Obesity, unspecified: Secondary | ICD-10-CM

## 2011-10-31 DIAGNOSIS — E66811 Obesity, class 1: Secondary | ICD-10-CM | POA: Insufficient documentation

## 2011-10-31 DIAGNOSIS — E119 Type 2 diabetes mellitus without complications: Secondary | ICD-10-CM

## 2011-10-31 MED ORDER — OMEPRAZOLE 20 MG PO CPDR: 20.0000 mg | DELAYED_RELEASE_CAPSULE | Freq: Every day | ORAL | Status: DC

## 2011-10-31 MED ORDER — LISINOPRIL 10 MG PO TABS: 10.0000 mg | ORAL_TABLET | Freq: Every day | ORAL | Status: DC

## 2011-10-31 MED ORDER — GLIPIZIDE ER 5 MG PO TB24: 5.0000 mg | ORAL_TABLET | Freq: Every day | ORAL | Status: DC

## 2011-10-31 MED ORDER — HYDROCHLOROTHIAZIDE 25 MG PO TABS: 25.0000 mg | ORAL_TABLET | Freq: Every day | ORAL | Status: DC

## 2011-10-31 NOTE — Progress Notes (Signed)
Subjective:    Patient ID: Erin Beck, female    DOB: [birth year], 71 y.o.   MRN: [id number]  HPI Here for check up of chronic medical conditions and to review health mt list  Is feeling pretty good   [facility]eds mammo done- [facility] No lumps or changes on self exam   No pap - had a hysterectomy No gyn problems   Is thinking about knee replaced -- saw Dr Leslee Home  Hard to walk or exercise  Need to get wt down bmi 35   bp is 114/70    Today No cp or palpitations or headaches or edema  No side effects to medicines     Diabetes Home sugar results -- meter not working well -- 115-120 in am , does not check in afternoons  DM diet - is just the same- follows it about 80% of the time  Did DM teaching years ago  Was sick with virus this season  Exercise none due to knee pain  Symptoms-none at all  A1C last - up to 7.2 No problems with medications - glipizide  Renal protection-is on ace Last eye exam -normal in jan     Zoster status - never had vaccine   colonosc 4/08- is due for one now (wants to get knee done first)  Wt is up 4 lb with bmi of 35  Patient Active Problem List  Diagnoses  . DIABETES MELLITUS, TYPE II  . HYPERLIPIDEMIA  . ANXIETY  . HYPERTENSION, BENIGN ESSENTIAL  . ALLERGIC RHINITIS  . OSTEOARTHRITIS  . DIVERTICULITIS, HX OF  . GERD  . TOBACCO USE, QUIT  . LLQ pain  . IBS (irritable bowel syndrome)  . Hematuria  . Obesity  . Other screening mammogram   Past Medical History  Diagnosis Date  . Anxiety   . Diabetes mellitus     type II  . Diverticulitis     Hx of  . Hyperlipidemia   . Arthritis     OA  . Osteopenia   . GERD (gastroesophageal reflux disease)    Past Surgical History  Procedure Date  . Abdominal hysterectomy   . Cholecystectomy   . Joint replacement   . Knee arthroscopy 1990    chondromalacia    History  Substance Use Topics  . Smoking status: Former Smoker    Quit date: 08/20/2009  . Smokeless tobacco: Not  on file  . Alcohol Use:    Family History  Problem Relation Age of Onset  . Diabetes Father   . Diabetes Brother    Allergies  Allergen Reactions  . Aleve Rash    Trouble breathing  . Naproxen Rash    Trouble breathing  . Aspirin     REACTION: burns stomach  . Atorvastatin     REACTION: muscle pain  . Buspar (Buspirone Hcl)     Multiple side eff  . Metformin     REACTION: Diarrhea  . Simvastatin     REACTION: GI side eff and swelling  . Sulfa Antibiotics Other (See Comments)    Severe joint pain  . Sulfonamide Derivatives     REACTION: reaction not known   Current Outpatient Prescriptions on File Prior to Visit  Medication Sig Dispense Refill  . albuterol (PROAIR HFA) 108 (90 BASE) MCG/ACT inhaler Inhale 2 puffs into the lungs every 6 (six) hours as needed for wheezing.  1 Inhaler  11  . ALPRAZolam (XANAX) 0.5 MG tablet Take 1 tablet (0.5 mg total) by  mouth at bedtime as needed for sleep or anxiety.  30 tablet  3  . aspirin 81 MG tablet Take 81 mg by mouth daily.        . Calcium Carbonate-Vit D-Min 600-400 MG-UNIT TABS Take 2 tablets by mouth daily.        . Flax OIL Take by mouth as directed.        Marland Kitchen glucose blood test strip Check blood sugar once daily and as needed.       Marland Kitchen glucose monitoring kit (FREESTYLE) monitoring kit Check blood sugar once a day and as needed for diabetes 250.0       . Lancets (FREESTYLE) lancets Check blood sugar once daily and as needed.       . niacin 500 MG tablet Take 500 mg by mouth Nightly.        . Omega-3 Fatty Acids (FISH OIL PO) Take by mouth as directed.        Marland Kitchen VITAMIN A PO Take 900 Units by mouth daily.             Review of Systems Review of Systems  Constitutional: Negative for fever, appetite change, fatigue and unexpected weight change.  Eyes: Negative for pain and visual disturbance.  Respiratory: Negative for cough and shortness of breath.   Cardiovascular: Negative for cp or palpitations    Gastrointestinal: Negative  for nausea, diarrhea and constipation.  Genitourinary: Negative for urgency and frequency.  Skin: Negative for pallor or rash   MSK pos for joint pain from OA  Neurological: Negative for weakness, light-headedness, numbness and headaches.  Hematological: Negative for adenopathy. Does not bruise/bleed easily.  Psychiatric/Behavioral: Negative for dysphoric mood. The patient is not nervous/anxious.          Objective:   Physical Exam  Constitutional: She appears well-developed and well-nourished. No distress.       Obese and well appearing   HENT:  Head: Normocephalic and atraumatic.  Mouth/Throat: Oropharynx is clear and moist.  Eyes: Conjunctivae and EOM are normal. Pupils are equal, round, and reactive to light. No scleral icterus.  Neck: Normal range of motion. Neck supple. No JVD present. No thyromegaly present.  Cardiovascular: Normal rate, regular rhythm, normal heart sounds and intact distal pulses.  Exam reveals no gallop.   Pulmonary/Chest: Effort normal and breath sounds normal. No respiratory distress. She has no wheezes.  Abdominal: Soft. Bowel sounds are normal. She exhibits no distension and no mass. There is no tenderness.  Genitourinary: No breast swelling, tenderness, discharge or bleeding.       Breast exam: No mass, nodules, thickening, tenderness, bulging, retraction, inflamation, nipple discharge or skin changes noted.  No axillary or clavicular LA.  Chaperoned exam.     Musculoskeletal: Normal range of motion. She exhibits no edema and no tenderness.  Lymphadenopathy:    She has no cervical adenopathy.  Neurological: She is alert. She has normal reflexes. No cranial nerve deficit. She exhibits normal muscle tone. Coordination normal.  Skin: Skin is warm and dry. No rash noted. No erythema. No pallor.  Psychiatric: She has a normal mood and affect.          Assessment & Plan:

## 2011-10-31 NOTE — Patient Instructions (Signed)
We will schedule mammogram at check out  Check sugar twice daily and as needed and follow up in 3 months with your readings  (am fasting and 2 hours after eve meal)  Work on diabetic diet and weight loss If you are interested in a shingles/zoster vaccine - call your insurance to check on coverage,( you should not get it within 1 month of other vaccines) , then call us for a prescription  for it to take to a pharmacy that gives the shot  You are due for colonoscopy - so don't forget to schedule that

## 2011-11-01 NOTE — Assessment & Plan Note (Signed)
a1c is up a bit - but per pt sugars ok Will start checking bid and f/u with log Disc foot care/ eye and kidney prot and goals for lipids and imms

## 2011-11-01 NOTE — Assessment & Plan Note (Signed)
Discussed how this problem influences overall health and the risks it imposes  Reviewed plan for weight loss with lower calorie diet (via better food choices and also portion control or program like weight watchers) and exercise building up to or more than 30 minutes 5 days per week including some aerobic activity    

## 2011-11-01 NOTE — Assessment & Plan Note (Signed)
Disc goals for lipids and reasons to control them Rev labs with pt Rev low sat fat diet in detail   

## 2011-11-01 NOTE — Assessment & Plan Note (Signed)
bp in fair control at this time  No changes needed  Disc lifstyle change with low sodium diet and exercise   

## 2011-11-01 NOTE — Assessment & Plan Note (Signed)
Scheduled annual screening mammogram Nl breast exam today  Encouraged monthly self exams   

## 2011-11-13 ENCOUNTER — Ambulatory Visit
Admission: RE | Admit: 2011-11-13 | Discharge: 2011-11-13 | Disposition: A | Discharge status: Home / Self Care | Payer: Medicare Other | Source: Ambulatory Visit | Attending: Family Medicine | Admitting: Family Medicine

## 2011-11-13 DIAGNOSIS — Z1231 Encounter for screening mammogram for malignant neoplasm of breast: Secondary | ICD-10-CM

## 2011-11-15 ENCOUNTER — Encounter: Payer: Self-pay | Admitting: *Deleted

## 2011-11-19 ENCOUNTER — Encounter: Payer: Self-pay | Admitting: Family Medicine

## 2011-11-19 ENCOUNTER — Ambulatory Visit (INDEPENDENT_AMBULATORY_CARE_PROVIDER_SITE_OTHER): Payer: Medicare Other | Admitting: Family Medicine

## 2011-11-19 VITALS — BP 110/70 | HR 85 | Temp 97.7°F | Ht 61.0 in | Wt 185.8 lb

## 2011-11-19 DIAGNOSIS — E119 Type 2 diabetes mellitus without complications: Secondary | ICD-10-CM

## 2011-11-19 DIAGNOSIS — I10 Essential (primary) hypertension: Secondary | ICD-10-CM

## 2011-11-19 DIAGNOSIS — Z01818 Encounter for other preprocedural examination: Secondary | ICD-10-CM | POA: Insufficient documentation

## 2011-11-19 MED ORDER — GLIPIZIDE ER 10 MG PO TB24: 10.0000 mg | ORAL_TABLET | Freq: Every day | ORAL | Status: DC

## 2011-11-19 NOTE — Assessment & Plan Note (Signed)
Rev sugars and they are not quite at goal Urged wt loss and good lifestyle habits  Will inc glucotrol XL to 10 mg daily  Update if low sugars or problems

## 2011-11-19 NOTE — Assessment & Plan Note (Signed)
bp in fair control at this time  No changes needed  Disc lifstyle change with low sodium diet and exercise   

## 2011-11-19 NOTE — Progress Notes (Signed)
Subjective:    Patient ID: Erin Beck, female    DOB: [birth year], 71 y.o.   MRN: [id number]  HPI Here for preoperative exam for R knee total replacement with patella resurfacing  With Dr Leslee Home Is not scheduled yet - will try to do it at the end of the month  Also check on DM  Drug allergies and intolerances noted    Anesthesia-- no problems at all   Needs EKG for her surgery EKG is NSR with rate of 77 and occ ectopic vent beat   Diabetes Home sugar results -- is having a hard time getting sugar down , 130s in the ams,  Pms 150s  DM diet - has been really good Exercise - cannot do much with her knee  Symptoms- none  A1C last   Lab Results  Component Value Date   HGBA1C 7.2* 10/24/2011   a bit higher than goal No problems with medications [place] ib glipizide XL  (cannot take metformin) Renal protection on ace  Last eye exam was jan 2013     Chemistry      Component Value Date/Time   NA 138 10/24/2011 0847   K 3.9 10/24/2011 0847   CL 100 10/24/2011 0847   CO2 29 10/24/2011 0847   BUN 13 10/24/2011 0847   CREATININE 0.8 10/24/2011 0847      Component Value Date/Time   CALCIUM 9.4 10/24/2011 0847   ALKPHOS 55 10/24/2011 0847   AST 32 10/24/2011 0847   ALT 49* 10/24/2011 0847   BILITOT 0.3 10/24/2011 0847        Patient Active Problem List  Diagnoses  . DIABETES MELLITUS, TYPE II  . HYPERLIPIDEMIA  . ANXIETY  . HYPERTENSION, BENIGN ESSENTIAL  . ALLERGIC RHINITIS  . OSTEOARTHRITIS  . DIVERTICULITIS, HX OF  . GERD  . TOBACCO USE, QUIT  . LLQ pain  . IBS (irritable bowel syndrome)  . Hematuria  . Obesity  . Other screening mammogram  . Preoperative examination   Past Medical History  Diagnosis Date  . Anxiety   . Diabetes mellitus     type II  . Diverticulitis     Hx of  . Hyperlipidemia   . Arthritis     OA  . Osteopenia   . GERD (gastroesophageal reflux disease)    Past Surgical History  Procedure Date  . Abdominal hysterectomy   . Cholecystectomy   .  Joint replacement   . Knee arthroscopy 1990    chondromalacia    History  Substance Use Topics  . Smoking status: Former Smoker    Quit date: 08/20/2009  . Smokeless tobacco: Not on file  . Alcohol Use:    Family History  Problem Relation Age of Onset  . Diabetes Father   . Diabetes Brother    Allergies  Allergen Reactions  . Aleve Rash    Trouble breathing  . Naproxen Rash    Trouble breathing  . Aspirin     REACTION: burns stomach  . Atorvastatin     REACTION: muscle pain  . Buspar (Buspirone Hcl)     Multiple side eff  . Metformin     REACTION: Diarrhea  . Simvastatin     REACTION: GI side eff and swelling  . Sulfa Antibiotics Other (See Comments)    Severe joint pain  . Sulfonamide Derivatives     REACTION: reaction not known   Current Outpatient Prescriptions on File Prior to Visit  Medication Sig Dispense Refill  .  albuterol (PROAIR HFA) 108 (90 BASE) MCG/ACT inhaler Inhale 2 puffs into the lungs every 6 (six) hours as needed for wheezing.  1 Inhaler  11  . ALPRAZolam (XANAX) 0.5 MG tablet Take 1 tablet (0.5 mg total) by mouth at bedtime as needed for sleep or anxiety.  30 tablet  3  . aspirin 81 MG tablet Take 81 mg by mouth daily.        . Calcium Carbonate-Vit D-Min 600-400 MG-UNIT TABS Take 2 tablets by mouth daily.        . Flax OIL Take by mouth as directed.        Marland Kitchen glucose blood test strip Check blood sugar once daily and as needed.       Marland Kitchen glucose monitoring kit (FREESTYLE) monitoring kit Check blood sugar once a day and as needed for diabetes 250.0       . hydrochlorothiazide (HYDRODIURIL) 25 MG tablet Take 1 tablet (25 mg total) by mouth daily.  90 tablet  3  . Lancets (FREESTYLE) lancets Check blood sugar once daily and as needed.       Marland Kitchen lisinopril (PRINIVIL,ZESTRIL) 10 MG tablet Take 1 tablet (10 mg total) by mouth daily.  90 tablet  3  . niacin 500 MG tablet Take 500 mg by mouth Nightly.        . Omega-3 Fatty Acids (FISH OIL PO) Take by mouth as  directed.        Marland Kitchen omeprazole (PRILOSEC) 20 MG capsule Take 1 capsule (20 mg total) by mouth daily.  90 capsule  3  . VITAMIN A PO Take 900 Units by mouth daily.           Review of Systems Review of Systems  Constitutional: Negative for fever, appetite change, fatigue and unexpected weight change.  Eyes: Negative for pain and visual disturbance.  Respiratory: Negative for cough and shortness of breath.   Cardiovascular: Negative for cp or palpitations    Gastrointestinal: Negative for nausea, diarrhea and constipation.  Genitourinary: Negative for urgency and frequency.  Skin: Negative for pallor or rash   Neurological: Negative for weakness, light-headedness, numbness and headaches.  Hematological: Negative for adenopathy. Does not bruise/bleed easily.  Psychiatric/Behavioral: Negative for dysphoric mood. The patient is not nervous/anxious.          Objective:   Physical Exam  Constitutional: She appears well-developed and well-nourished. No distress.       Obese and well appearing   HENT:  Head: Normocephalic and atraumatic.  Mouth/Throat: No oropharyngeal exudate.  Eyes: Conjunctivae and EOM are normal. Pupils are equal, round, and reactive to light. No scleral icterus.  Neck: Normal range of motion. Neck supple.  Cardiovascular: Normal rate, regular rhythm, normal heart sounds and intact distal pulses.  Exam reveals no gallop.   No murmur heard. Pulmonary/Chest: Effort normal and breath sounds normal. No respiratory distress. She has no wheezes.  Abdominal: Soft. Bowel sounds are normal. She exhibits no distension and no mass. There is no tenderness.  Musculoskeletal: Normal range of motion. She exhibits no edema and no tenderness.  Neurological: She is alert. She has normal reflexes. No cranial nerve deficit. She exhibits normal muscle tone. Coordination normal.  Skin: Skin is warm and dry. No rash noted. No erythema. No pallor.  Psychiatric: She has a normal mood and  affect.          Assessment & Plan:

## 2011-11-19 NOTE — Patient Instructions (Signed)
Take glucotrol xl 10 mg (instead of 5) each am Watch sugars carefully  Eat a diabetic diet  Get all the exercise you can and work hard on weight loss You are cleared for surgery for your knee   Follow up in about 3 months

## 2011-11-19 NOTE — Assessment & Plan Note (Addendum)
Cleared medically for her surgery on knee Rev EKG - NSR with no acute changes  Rev hx and meds  Working on getting sugar down - urged surgeon to watch glucose in hospital

## 2011-11-20 ENCOUNTER — Other Ambulatory Visit: Payer: Self-pay | Admitting: Orthopedic Surgery

## 2011-11-22 ENCOUNTER — Encounter (HOSPITAL_COMMUNITY): Payer: Self-pay | Admitting: Pharmacy Technician

## 2011-11-23 NOTE — H&P (Signed)
NAME:  Erin Beck, Erin Beck NO.:  [id number]  MEDICAL RECORD NO.:  [id number]  LOCATION:                               FACILITY:  [facility]  PHYSICIAN:  Marlowe Kays, M.D.  DATE OF BIRTH:  [birth year]  DATE OF ADMISSION:  12/05/2011 DATE OF DISCHARGE:                             HISTORY & PHYSICAL   SCHEDULED FOR ADMISSION:  December 04, 1928.  CHIEF COMPLAINT:  Pain in my right knee.  HISTORY OF PRESENT ILLNESS:  This 71 year old white female seen by [place] for continuing progressive pain into her right knee.  We have been treating this knee for considerable period of time conservatively with viscosupplementation as well as cortisone injections.  She has been a very stoic, cooperative patient, but now as she is to the point where she has markedly interfering with her day-to-day activities.  She is quite tired of it since she has had a left total knee in the past, which has done very well, she would like to go ahead with right total knee.  X- rays had shown bone-on-bone deformity.  She has been cleared preoperatively with Dr. Roxy Manns, for this surgical procedure.  CURRENT MEDICATIONS: 1. Lisinopril 10 mg tablet daily. 2. Hydrochlorothiazide 25 mg daily. 3. Glipizide 10 mg daily. 4. Omeprazole DR 10 mg capsule daily. 5. Aspirin 81 mg daily. 6. ProAir HFA 90 mcg inhaler p.r.n.  OTHER MEDICAL PROBLEMS: 1. Macular degeneration. 2. Asthma. 3. Hypertension. 4. Reflux. 5. She also has type 2 diabetes.  PREVIOUS SURGERIES:  Left knee replacement arthroplasty; bilateral lens extractions with secondary to cataracts; cholecystectomy; carpal tunnel, both hands; hysterectomy; and right plantar wart removed from the right foot.  FAMILY HISTORY:  Positive for gallbladder cancer in the father, pneumonia in the mother.  SOCIAL HISTORY:  The patient is married, has no intake of alcohol or tobacco products.  She has 3 children.  REVIEW OF SYSTEMS:  CENTRAL NERVOUS SYSTEM:   No seizure disorder, paralysis, numbness, or double vision.  RESPIRATORY:  He had occasional shortness of breath, used her inhaler and does quite well.  No hemoptysis.  CARDIOVASCULAR:  No chest pain, no angina, no orthopnea. GASTROINTESTINAL:  No nausea.  No melena or bloody stool. GENITOURINARY:  No discharge, dysuria, or hematuria.  MUSCULOSKELETAL: Primarily in present illness.  PHYSICAL EXAMINATION:  GENERAL:  Alert, cooperative, friendly 71 year old white female, looking somewhat younger than stated age, accompanied by her husband. VITAL SIGNS:  Blood pressure 136/75, pulse 60, respirations 12. HEENT:  Normocephalic.  PERRLA.  EOM intact.  Oropharynx is clear. CHEST:  Clear to auscultation.  No rhonchi.  No rales. HEART:  Regular rate and rhythm.  No murmurs are heard. ABDOMEN:  Soft, nontender.  Liver/spleen not felt. GENITALIA/RECTAL/PELVIC/BREASTS:  Not done, not pertinent to present illness. EXTREMITIES:  The patient has pain and crepitus with range of motion of the right knee.  She has also mild valgus deformity.  ADMITTING DIAGNOSES: 1. End-stage osteoarthritis, right knee. 2. Asthma. 3. Diabetes type 2. 4. Gastroesophageal reflux disease. 5. Hypertension.  PLAN:  The patient will undergo total knee replacement arthroplasty of the right knee as when she had her left knee done, she had home physical  therapy and plans to have the same situation after her regular hospitalization.  Should we have any medical problems, specifically any cardiology problems, we will certainly call Dr. Roxy Manns of [facility].     Rudie Rikard L. Cherlynn June.   ______________________________ Marlowe Kays, M.D.    DLU/MEDQ  D:  11/22/2011  T:  11/23/2011  Job:  [id number]  cc:   Marne A. Tower, M.D. [facility]

## 2011-11-26 ENCOUNTER — Ambulatory Visit (HOSPITAL_COMMUNITY)
Admission: RE | Admit: 2011-11-26 | Discharge: 2011-11-26 | Disposition: A | Discharge status: Home / Self Care | Payer: Medicare Other | Source: Ambulatory Visit | Attending: Orthopedic Surgery | Admitting: Orthopedic Surgery

## 2011-11-26 ENCOUNTER — Encounter (HOSPITAL_COMMUNITY): Payer: Self-pay

## 2011-11-26 ENCOUNTER — Encounter (HOSPITAL_COMMUNITY)
Admission: RE | Admit: 2011-11-26 | Discharge: 2011-11-26 | Disposition: A | Discharge status: Home / Self Care | Payer: Medicare Other | Source: Ambulatory Visit | Attending: Orthopedic Surgery | Admitting: Orthopedic Surgery

## 2011-11-26 DIAGNOSIS — M47814 Spondylosis without myelopathy or radiculopathy, thoracic region: Secondary | ICD-10-CM | POA: Insufficient documentation

## 2011-11-26 DIAGNOSIS — Z01818 Encounter for other preprocedural examination: Secondary | ICD-10-CM | POA: Insufficient documentation

## 2011-11-26 DIAGNOSIS — Z01812 Encounter for preprocedural laboratory examination: Secondary | ICD-10-CM | POA: Insufficient documentation

## 2011-11-26 HISTORY — DX: Irritable bowel syndrome, unspecified: K58.9

## 2011-11-26 HISTORY — DX: Essential (primary) hypertension: I10

## 2011-11-26 LAB — CBC
Hemoglobin: 14.9 g/dL (ref 12.0–15.0)
MCH: 30.8 pg (ref 26.0–34.0)
RBC: 4.84 MIL/uL (ref 3.87–5.11)
WBC: 7.8 10*3/uL (ref 4.0–10.5)

## 2011-11-26 LAB — PROTIME-INR: INR: 0.87 (ref 0.00–1.49)

## 2011-11-26 LAB — BASIC METABOLIC PANEL
CO2: 31 mEq/L (ref 19–32)
Calcium: 9.8 mg/dL (ref 8.4–10.5)
Chloride: 100 mEq/L (ref 96–112)
Glucose, Bld: 100 mg/dL — ABNORMAL HIGH (ref 70–99)
Potassium: 3.9 mEq/L (ref 3.5–5.1)
Sodium: 140 mEq/L (ref 135–145)

## 2011-11-26 LAB — APTT: aPTT: 30 seconds (ref 24–37)

## 2011-11-26 LAB — SURGICAL PCR SCREEN: MRSA, PCR: NEGATIVE

## 2011-11-26 NOTE — Patient Instructions (Signed)
YOUR SURGERY IS SCHEDULED ON:  WED  4/17  AT 1:30 PM  REPORT TO [facility] AT:  11;30 am      PHONE # FOR SHORT STAY IS [phone]  DO NOT EAT OR DRINK ANYTHING AFTER MIDNIGHT THE NIGHT BEFORE YOUR SURGERY.  YOU MAY BRUSH YOUR TEETH, RINSE OUT YOUR MOUTH--BUT NO WATER, NO FOOD, NO CHEWING GUM, NO MINTS, NO CANDIES, NO CHEWING TOBACCO.  PLEASE TAKE THE FOLLOWING MEDICATIONS THE AM OF YOUR SURGERY WITH A FEW SIPS OF WATER:  OMEPRAZOLE.  USE ALBUTEROL IF NEEDED.    IF YOU USE INHALERS--USE YOUR INHALERS THE AM OF YOUR SURGERY AND BRING INHALERS TO THE HOSPITAL -TAKE TO SURGERY.    IF YOU ARE DIABETIC:  DO NOT TAKE ANY DIABETIC MEDICATIONS THE AM OF YOUR SURGERY.  IF YOU TAKE INSULIN IN THE EVENINGS--PLEASE ONLY TAKE 1/2 NORMAL EVENING DOSE THE NIGHT BEFORE YOUR SURGERY.  NO INSULIN THE AM OF YOUR SURGERY.  IF YOU HAVE SLEEP APNEA AND USE CPAP OR BIPAP--PLEASE BRING THE MASK --NOT THE MACHINE-NOT THE TUBING   -JUST THE MASK. DO NOT BRING VALUABLES, MONEY, CREDIT CARDS.  CONTACT LENS, DENTURES / PARTIALS, GLASSES SHOULD NOT BE WORN TO SURGERY AND IN MOST CASES-HEARING AIDS WILL NEED TO BE REMOVED.  BRING YOUR GLASSES CASE, ANY EQUIPMENT NEEDED FOR YOUR CONTACT LENS. FOR PATIENTS ADMITTED TO THE HOSPITAL--CHECK OUT TIME THE DAY OF DISCHARGE IS 11:00 AM.  ALL INPATIENT ROOMS ARE PRIVATE - WITH BATHROOM, TELEPHONE, TELEVISION AND WIFI INTERNET. IF YOU ARE BEING DISCHARGED THE SAME DAY OF YOUR SURGERY--YOU CAN NOT DRIVE YOURSELF HOME--AND SHOULD NOT GO HOME ALONE BY TAXI OR BUS.  NO DRIVING OR OPERATING MACHINERY FOR 24 HOURS FOLLOWING ANESTHESIA / PAIN MEDICATIONS.                            SPECIAL INSTRUCTIONS:  CHLORHEXIDINE SOAP SHOWER (other brand names are Betasept and Hibiclens ) PLEASE SHOWER WITH CHLORHEXIDINE THE NIGHT BEFORE YOUR SURGERY AND THE AM OF YOUR SURGERY. DO NOT USE CHLORHEXIDINE ON YOUR FACE OR PRIVATE AREAS--YOU MAY USE YOUR NORMAL SOAP THOSE AREAS AND YOUR  NORMAL SHAMPOO.  WOMEN SHOULD AVOID SHAVING UNDER ARMS AND SHAVING LEGS 48 HOURS BEFORE USING CHLORHEXIDINE TO AVOID SKIN IRRITATION.  DO NOT USE IF ALLERGIC TO CHLORHEXIDINE.  PLEASE READ OVER ANY  FACT SHEETS THAT YOU WERE GIVEN: MRSA INFORMATION, BLOOD TRANSFUSION INFORMATION, INCENTIVE SPIROMETER INFORMATION.

## 2011-11-26 NOTE — Pre-Procedure Instructions (Signed)
EKG REPORT, OFFICE NOTE, AND MEDICAL CLEARANCE FOR RIGHT TOTAL KNEE SURGERY - ON PT'S CHART-FROM DR. TOWER-DONE 11/19/11. CXR, CBC, BMET, PT, PTT WERE DONE TODAY AT [facility] AND T/S WILL BE DRAWN ON PT'S ARRIVAL FOR SURGERY.

## 2011-12-05 ENCOUNTER — Ambulatory Visit (HOSPITAL_COMMUNITY): Payer: Medicare Other

## 2011-12-05 ENCOUNTER — Encounter (HOSPITAL_COMMUNITY): Payer: Self-pay | Admitting: Anesthesiology

## 2011-12-05 ENCOUNTER — Encounter (HOSPITAL_COMMUNITY)
Admission: RE | Disposition: A | Discharge status: Home Health | Payer: Self-pay | Source: Ambulatory Visit | Attending: Orthopedic Surgery

## 2011-12-05 ENCOUNTER — Ambulatory Visit (HOSPITAL_COMMUNITY): Payer: Medicare Other | Admitting: Anesthesiology

## 2011-12-05 ENCOUNTER — Encounter (HOSPITAL_COMMUNITY): Payer: Self-pay | Admitting: *Deleted

## 2011-12-05 ENCOUNTER — Inpatient Hospital Stay (HOSPITAL_COMMUNITY)
Admission: RE | Admit: 2011-12-05 | Discharge: 2011-12-09 | DRG: 470 | Disposition: A | Discharge status: Home Health | Payer: Medicare Other | Source: Ambulatory Visit | Attending: Orthopedic Surgery | Admitting: Orthopedic Surgery

## 2011-12-05 DIAGNOSIS — E119 Type 2 diabetes mellitus without complications: Secondary | ICD-10-CM | POA: Diagnosis present

## 2011-12-05 DIAGNOSIS — I10 Essential (primary) hypertension: Secondary | ICD-10-CM | POA: Diagnosis present

## 2011-12-05 DIAGNOSIS — H3530 Unspecified macular degeneration: Secondary | ICD-10-CM | POA: Diagnosis present

## 2011-12-05 DIAGNOSIS — Z96659 Presence of unspecified artificial knee joint: Secondary | ICD-10-CM

## 2011-12-05 DIAGNOSIS — M1710 Unilateral primary osteoarthritis, unspecified knee: Principal | ICD-10-CM | POA: Diagnosis present

## 2011-12-05 DIAGNOSIS — K219 Gastro-esophageal reflux disease without esophagitis: Secondary | ICD-10-CM | POA: Diagnosis present

## 2011-12-05 HISTORY — PX: TOTAL KNEE ARTHROPLASTY: SHX125

## 2011-12-05 LAB — TYPE AND SCREEN
ABO/RH(D): O POS
Antibody Screen: NEGATIVE

## 2011-12-05 SURGERY — ARTHROPLASTY, KNEE, TOTAL
Anesthesia: General | Site: Knee | Laterality: Right | Wound class: Clean

## 2011-12-05 MED ORDER — FENTANYL CITRATE 0.05 MG/ML IJ SOLN
INTRAMUSCULAR | Status: AC
  Filled 2011-12-05: qty 2

## 2011-12-05 MED ORDER — GLUCOSE BLOOD VI STRP
1.0000 | ORAL_STRIP | Freq: Every morning | Status: DC
  Filled 2011-12-05 (×7): qty 1

## 2011-12-05 MED ORDER — MIDAZOLAM HCL 5 MG/5ML IJ SOLN
INTRAMUSCULAR | Status: DC | PRN
  Administered 2011-12-05: 1 mg via INTRAVENOUS

## 2011-12-05 MED ORDER — ACETAMINOPHEN 10 MG/ML IV SOLN
INTRAVENOUS | Status: DC | PRN
  Administered 2011-12-05: 1000 mg via INTRAVENOUS

## 2011-12-05 MED ORDER — ONDANSETRON HCL 4 MG/2ML IJ SOLN
INTRAMUSCULAR | Status: DC | PRN
  Administered 2011-12-05 (×2): 2 mg via INTRAVENOUS

## 2011-12-05 MED ORDER — HYDROMORPHONE HCL PF 1 MG/ML IJ SOLN
0.5000 mg | INTRAMUSCULAR | Status: DC | PRN
  Administered 2011-12-05 (×2): 1 mg via INTRAVENOUS
  Administered 2011-12-06: 0.5 mg via INTRAVENOUS
  Filled 2011-12-05 (×3): qty 1

## 2011-12-05 MED ORDER — PROMETHAZINE HCL 25 MG/ML IJ SOLN: 6.2500 mg | INTRAMUSCULAR | Status: DC | PRN

## 2011-12-05 MED ORDER — RIVAROXABAN 10 MG PO TABS
10.0000 mg | ORAL_TABLET | Freq: Every day | ORAL | Status: DC
  Administered 2011-12-06 – 2011-12-09 (×4): 10 mg via ORAL
  Filled 2011-12-05 (×4): qty 1

## 2011-12-05 MED ORDER — CEFAZOLIN SODIUM-DEXTROSE 2-3 GM-% IV SOLR
2.0000 g | Freq: Once | INTRAVENOUS | Status: AC
  Administered 2011-12-05: 2 g via INTRAVENOUS

## 2011-12-05 MED ORDER — ALBUTEROL SULFATE HFA 108 (90 BASE) MCG/ACT IN AERS
2.0000 | INHALATION_SPRAY | Freq: Four times a day (QID) | RESPIRATORY_TRACT | Status: DC | PRN
  Filled 2011-12-05: qty 6.7

## 2011-12-05 MED ORDER — KETAMINE HCL 10 MG/ML IJ SOLN
INTRAMUSCULAR | Status: DC | PRN
  Administered 2011-12-05: 5 mg via INTRAVENOUS
  Administered 2011-12-05: 2.5 mg via INTRAVENOUS
  Administered 2011-12-05 (×2): 10 mg via INTRAVENOUS
  Administered 2011-12-05: 2.5 mg via INTRAVENOUS

## 2011-12-05 MED ORDER — ONDANSETRON HCL 4 MG/2ML IJ SOLN
4.0000 mg | Freq: Four times a day (QID) | INTRAMUSCULAR | Status: DC | PRN
  Administered 2011-12-06: 4 mg via INTRAVENOUS
  Filled 2011-12-05: qty 2

## 2011-12-05 MED ORDER — OXYCODONE-ACETAMINOPHEN 5-325 MG PO TABS
1.0000 | ORAL_TABLET | ORAL | Status: DC | PRN
  Administered 2011-12-05: 1 via ORAL
  Administered 2011-12-06: 2 via ORAL
  Administered 2011-12-06: 1 via ORAL
  Administered 2011-12-06 – 2011-12-09 (×13): 2 via ORAL
  Filled 2011-12-05 (×8): qty 2
  Filled 2011-12-05: qty 1
  Filled 2011-12-05 (×2): qty 2
  Filled 2011-12-05: qty 1
  Filled 2011-12-05 (×4): qty 2

## 2011-12-05 MED ORDER — CALCIUM CARBONATE-VITAMIN D 500-200 MG-UNIT PO TABS
2.0000 | ORAL_TABLET | Freq: Every day | ORAL | Status: DC
  Administered 2011-12-06 – 2011-12-09 (×4): 2 via ORAL
  Filled 2011-12-05 (×4): qty 2

## 2011-12-05 MED ORDER — NIACIN 500 MG PO TABS
500.0000 mg | ORAL_TABLET | Freq: Every evening | ORAL | Status: DC
  Administered 2011-12-05 – 2011-12-07 (×2): 500 mg via ORAL
  Filled 2011-12-05 (×3): qty 1

## 2011-12-05 MED ORDER — PANTOPRAZOLE SODIUM 40 MG PO TBEC
40.0000 mg | DELAYED_RELEASE_TABLET | Freq: Every day | ORAL | Status: DC
  Administered 2011-12-05 – 2011-12-09 (×5): 40 mg via ORAL
  Filled 2011-12-05 (×5): qty 1

## 2011-12-05 MED ORDER — CISATRACURIUM BESYLATE 2 MG/ML IV SOLN
INTRAVENOUS | Status: DC | PRN
  Administered 2011-12-05: 6 mg via INTRAVENOUS

## 2011-12-05 MED ORDER — ONDANSETRON HCL 4 MG PO TABS: 4.0000 mg | ORAL_TABLET | Freq: Four times a day (QID) | ORAL | Status: DC | PRN

## 2011-12-05 MED ORDER — FENTANYL CITRATE 0.05 MG/ML IJ SOLN
50.0000 ug | INTRAMUSCULAR | Status: DC | PRN
  Administered 2011-12-05: 100 ug via INTRAVENOUS

## 2011-12-05 MED ORDER — NEOSTIGMINE METHYLSULFATE 1 MG/ML IJ SOLN
INTRAMUSCULAR | Status: DC | PRN
  Administered 2011-12-05: 3 mg via INTRAVENOUS

## 2011-12-05 MED ORDER — INSULIN ASPART 100 UNIT/ML ~~LOC~~ SOLN
0.0000 [IU] | Freq: Every day | SUBCUTANEOUS | Status: DC
  Administered 2011-12-05: 3 [IU] via SUBCUTANEOUS

## 2011-12-05 MED ORDER — LACTATED RINGERS IV SOLN
INTRAVENOUS | Status: DC
  Administered 2011-12-05: 1000 mL via INTRAVENOUS

## 2011-12-05 MED ORDER — FENTANYL CITRATE 0.05 MG/ML IJ SOLN
INTRAMUSCULAR | Status: DC | PRN
  Administered 2011-12-05 (×2): 50 ug via INTRAVENOUS
  Administered 2011-12-05: 100 ug via INTRAVENOUS
  Administered 2011-12-05 (×2): 50 ug via INTRAVENOUS
  Administered 2011-12-05: 100 ug via INTRAVENOUS
  Administered 2011-12-05 (×2): 50 ug via INTRAVENOUS

## 2011-12-05 MED ORDER — LISINOPRIL 10 MG PO TABS
10.0000 mg | ORAL_TABLET | Freq: Every morning | ORAL | Status: DC
  Administered 2011-12-06 – 2011-12-09 (×4): 10 mg via ORAL
  Filled 2011-12-05 (×4): qty 1

## 2011-12-05 MED ORDER — METHOCARBAMOL 100 MG/ML IJ SOLN
500.0000 mg | Freq: Four times a day (QID) | INTRAVENOUS | Status: DC | PRN
  Administered 2011-12-05: 500 mg via INTRAVENOUS
  Filled 2011-12-05 (×2): qty 5

## 2011-12-05 MED ORDER — BUPIVACAINE-EPINEPHRINE (PF) 0.5% -1:200000 IJ SOLN
INTRAMUSCULAR | Status: AC
  Filled 2011-12-05: qty 10

## 2011-12-05 MED ORDER — LACTATED RINGERS IV SOLN
INTRAVENOUS | Status: DC | PRN
  Administered 2011-12-05 (×2): via INTRAVENOUS

## 2011-12-05 MED ORDER — HYDROCHLOROTHIAZIDE 25 MG PO TABS
25.0000 mg | ORAL_TABLET | Freq: Every morning | ORAL | Status: DC
  Administered 2011-12-06 – 2011-12-09 (×4): 25 mg via ORAL
  Filled 2011-12-05 (×5): qty 1

## 2011-12-05 MED ORDER — POVIDONE-IODINE 7.5 % EX SOLN: Freq: Once | CUTANEOUS | Status: DC

## 2011-12-05 MED ORDER — METHOCARBAMOL 500 MG PO TABS
500.0000 mg | ORAL_TABLET | Freq: Four times a day (QID) | ORAL | Status: DC | PRN
  Administered 2011-12-06 – 2011-12-09 (×10): 500 mg via ORAL
  Filled 2011-12-05 (×11): qty 1

## 2011-12-05 MED ORDER — INSULIN ASPART 100 UNIT/ML ~~LOC~~ SOLN: 0.0000 [IU] | Freq: Every day | SUBCUTANEOUS | Status: DC

## 2011-12-05 MED ORDER — DEXTROSE-NACL 5-0.45 % IV SOLN
INTRAVENOUS | Status: DC
  Administered 2011-12-05 – 2011-12-07 (×4): via INTRAVENOUS

## 2011-12-05 MED ORDER — HYDROMORPHONE HCL PF 1 MG/ML IJ SOLN
0.2500 mg | INTRAMUSCULAR | Status: DC | PRN
  Administered 2011-12-05: 0.5 mg via INTRAVENOUS
  Administered 2011-12-05 (×2): 0.25 mg via INTRAVENOUS

## 2011-12-05 MED ORDER — SUCCINYLCHOLINE CHLORIDE 20 MG/ML IJ SOLN
INTRAMUSCULAR | Status: DC | PRN
  Administered 2011-12-05: 100 mg via INTRAVENOUS

## 2011-12-05 MED ORDER — HYDROMORPHONE HCL PF 1 MG/ML IJ SOLN
INTRAMUSCULAR | Status: AC
  Filled 2011-12-05: qty 1

## 2011-12-05 MED ORDER — PROPOFOL 10 MG/ML IV EMUL
INTRAVENOUS | Status: DC | PRN
  Administered 2011-12-05: 150 mg via INTRAVENOUS

## 2011-12-05 MED ORDER — METOCLOPRAMIDE HCL 10 MG PO TABS: 5.0000 mg | ORAL_TABLET | Freq: Three times a day (TID) | ORAL | Status: DC | PRN

## 2011-12-05 MED ORDER — BUPIVACAINE-EPINEPHRINE PF 0.25-1:200000 % IJ SOLN
INTRAMUSCULAR | Status: DC | PRN
  Administered 2011-12-05: 12 mL

## 2011-12-05 MED ORDER — CEFAZOLIN SODIUM-DEXTROSE 2-3 GM-% IV SOLR
2.0000 g | Freq: Four times a day (QID) | INTRAVENOUS | Status: AC
  Administered 2011-12-05 – 2011-12-06 (×3): 2 g via INTRAVENOUS
  Filled 2011-12-05 (×3): qty 50

## 2011-12-05 MED ORDER — CALCIUM CARBONATE-VIT D-MIN 600-400 MG-UNIT PO TABS: 2.0000 | ORAL_TABLET | Freq: Every morning | ORAL | Status: DC

## 2011-12-05 MED ORDER — GLIPIZIDE ER 10 MG PO TB24
10.0000 mg | ORAL_TABLET | Freq: Every day | ORAL | Status: DC
  Administered 2011-12-06 – 2011-12-09 (×4): 10 mg via ORAL
  Filled 2011-12-05 (×4): qty 1

## 2011-12-05 MED ORDER — SODIUM CHLORIDE 0.9 % IR SOLN
Status: DC | PRN
  Administered 2011-12-05: 1000 mL

## 2011-12-05 MED ORDER — EPHEDRINE SULFATE 50 MG/ML IJ SOLN
INTRAMUSCULAR | Status: DC | PRN
  Administered 2011-12-05: 10 mg via INTRAVENOUS

## 2011-12-05 MED ORDER — FENTANYL CITRATE 0.05 MG/ML IJ SOLN
25.0000 ug | INTRAMUSCULAR | Status: DC | PRN
  Administered 2011-12-05 (×2): 50 ug via INTRAVENOUS

## 2011-12-05 MED ORDER — CEFAZOLIN SODIUM-DEXTROSE 2-3 GM-% IV SOLR
INTRAVENOUS | Status: AC
  Filled 2011-12-05: qty 50

## 2011-12-05 MED ORDER — METOCLOPRAMIDE HCL 5 MG/ML IJ SOLN
5.0000 mg | Freq: Three times a day (TID) | INTRAMUSCULAR | Status: DC | PRN
  Administered 2011-12-05: 10 mg via INTRAVENOUS
  Filled 2011-12-05: qty 2

## 2011-12-05 MED ORDER — GLYCOPYRROLATE 0.2 MG/ML IJ SOLN
INTRAMUSCULAR | Status: DC | PRN
  Administered 2011-12-05: 0.4 mg via INTRAVENOUS

## 2011-12-05 MED ORDER — LIDOCAINE HCL (CARDIAC) 20 MG/ML IV SOLN
INTRAVENOUS | Status: DC | PRN
  Administered 2011-12-05: 30 mg via INTRAVENOUS

## 2011-12-05 MED ORDER — INSULIN ASPART 100 UNIT/ML ~~LOC~~ SOLN
0.0000 [IU] | Freq: Three times a day (TID) | SUBCUTANEOUS | Status: DC
  Administered 2011-12-06: 8 [IU] via SUBCUTANEOUS
  Administered 2011-12-06 (×2): 3 [IU] via SUBCUTANEOUS
  Administered 2011-12-07: 5 [IU] via SUBCUTANEOUS
  Administered 2011-12-07 (×2): 3 [IU] via SUBCUTANEOUS
  Administered 2011-12-08: 5 [IU] via SUBCUTANEOUS
  Administered 2011-12-08: 3 [IU] via SUBCUTANEOUS
  Administered 2011-12-09: 2 [IU] via SUBCUTANEOUS

## 2011-12-05 MED ORDER — ACETAMINOPHEN 10 MG/ML IV SOLN
INTRAVENOUS | Status: AC
  Filled 2011-12-05: qty 100

## 2011-12-05 MED ORDER — BUPIVACAINE-EPINEPHRINE PF 0.25-1:200000 % IJ SOLN
INTRAMUSCULAR | Status: AC
  Filled 2011-12-05: qty 30

## 2011-12-05 SURGICAL SUPPLY — 52 items
BAG ZIPLOCK 12X15 (MISCELLANEOUS) ×4 IMPLANT
BANDAGE ELASTIC 4 VELCRO ST LF (GAUZE/BANDAGES/DRESSINGS) ×2 IMPLANT
BANDAGE ELASTIC 6 VELCRO ST LF (GAUZE/BANDAGES/DRESSINGS) ×2 IMPLANT
BANDAGE ESMARK 6X9 LF (GAUZE/BANDAGES/DRESSINGS) ×1 IMPLANT
BANDAGE GAUZE ELAST BULKY 4 IN (GAUZE/BANDAGES/DRESSINGS) ×2 IMPLANT
BLADE SAG 18X100X1.27 (BLADE) ×2 IMPLANT
BNDG ESMARK 6X9 LF (GAUZE/BANDAGES/DRESSINGS) ×2
CEMENT BONE 1-PACK (Cement) ×4 IMPLANT
CLOTH BEACON ORANGE TIMEOUT ST (SAFETY) ×2 IMPLANT
CONT SPECI 4OZ STER CLIK (MISCELLANEOUS) ×2 IMPLANT
CUFF TOURN SGL QUICK 34 (TOURNIQUET CUFF) ×1
CUFF TRNQT CYL 34X4X40X1 (TOURNIQUET CUFF) ×1 IMPLANT
DRAPE EXTREMITY T 121X128X90 (DRAPE) ×2 IMPLANT
DRAPE LG THREE QUARTER DISP (DRAPES) ×2 IMPLANT
DRAPE POUCH INSTRU U-SHP 10X18 (DRAPES) ×2 IMPLANT
DRAPE U-SHAPE 47X51 STRL (DRAPES) ×2 IMPLANT
DRSG ADAPTIC 3X8 NADH LF (GAUZE/BANDAGES/DRESSINGS) ×2 IMPLANT
DRSG PAD ABDOMINAL 8X10 ST (GAUZE/BANDAGES/DRESSINGS) ×2 IMPLANT
ELECT BLADE TIP CTD 4 INCH (ELECTRODE) ×2 IMPLANT
ELECT REM PT RETURN 9FT ADLT (ELECTROSURGICAL) ×2
ELECTRODE REM PT RTRN 9FT ADLT (ELECTROSURGICAL) ×1 IMPLANT
EVACUATOR 1/8 PVC DRAIN (DRAIN) ×2 IMPLANT
FACESHIELD LNG OPTICON STERILE (SAFETY) ×10 IMPLANT
GLOVE ECLIPSE 8.0 STRL XLNG CF (GLOVE) ×4 IMPLANT
GLOVE INDICATOR 8.0 STRL GRN (GLOVE) ×6 IMPLANT
GOWN STRL REIN XL XLG (GOWN DISPOSABLE) ×4 IMPLANT
HANDPIECE INTERPULSE COAX TIP (DISPOSABLE) ×1
IMMOBILIZER KNEE 20 (SOFTGOODS)
IMMOBILIZER KNEE 20 THIGH 36 (SOFTGOODS) IMPLANT
KIT BASIN OR (CUSTOM PROCEDURE TRAY) ×2 IMPLANT
MANIFOLD NEPTUNE II (INSTRUMENTS) ×2 IMPLANT
NEEDLE HYPO 22GX1.5 SAFETY (NEEDLE) ×2 IMPLANT
NS IRRIG 1000ML POUR BTL (IV SOLUTION) ×2 IMPLANT
PACK TOTAL JOINT (CUSTOM PROCEDURE TRAY) ×2 IMPLANT
POSITIONER SURGICAL ARM (MISCELLANEOUS) ×2 IMPLANT
SET HNDPC FAN SPRY TIP SCT (DISPOSABLE) ×1 IMPLANT
SPONGE GAUZE 4X4 12PLY (GAUZE/BANDAGES/DRESSINGS) ×2 IMPLANT
SPONGE LAP 18X18 X RAY DECT (DISPOSABLE) IMPLANT
SPONGE SURGIFOAM ABS GEL 100 (HEMOSTASIS) ×2 IMPLANT
STAPLER VISISTAT 35W (STAPLE) ×2 IMPLANT
SUCTION FRAZIER 12FR DISP (SUCTIONS) ×2 IMPLANT
SUT BONE WAX W31G (SUTURE) ×2 IMPLANT
SUT VIC AB 1 CT1 27 (SUTURE) ×6
SUT VIC AB 1 CT1 27XBRD ANTBC (SUTURE) ×6 IMPLANT
SUT VIC AB 2-0 CT1 27 (SUTURE) ×2
SUT VIC AB 2-0 CT1 27XBRD (SUTURE) ×2 IMPLANT
SYR 20CC LL (SYRINGE) ×2 IMPLANT
TOWEL OR 17X26 10 PK STRL BLUE (TOWEL DISPOSABLE) ×4 IMPLANT
TOWER CARTRIDGE SMART MIX (DISPOSABLE) ×2 IMPLANT
TRAY FOLEY CATH 14FRSI W/METER (CATHETERS) IMPLANT
WATER STERILE IRR 1500ML POUR (IV SOLUTION) ×2 IMPLANT
WRAP KNEE MAXI GEL POST OP (GAUZE/BANDAGES/DRESSINGS) ×4 IMPLANT

## 2011-12-05 NOTE — Brief Op Note (Signed)
12/05/2011  4:15 PM  PATIENT:  Erin Beck  71 y.o. female  PRE-OPERATIVE DIAGNOSIS:  osteoarthritis right knee  POST-OPERATIVE DIAGNOSIS:  osteoarthritis right knee  PROCEDURE:  Procedure(s) (LRB): TOTAL KNEE ARTHROPLASTY (Right)  SURGEON:  Surgeon(s) and Role:    * Jacki Cones, MD - Assisting    * Drucilla Schmidt, MD - Primary  PHYSICIAN ASSISTANT:   ASSISTANTS: nurse   ANESTHESIA:   local and general  EBL:  Total I/O In: 1700 [I.V.:1700] Out: 300 [Urine:200; Blood:100]  BLOOD ADMINISTERED:none  DRAINS: (one hemovac in rt knee) Hemovact drain(s) in the rt knee with  Suction Open   LOCAL MEDICATIONS USED:  MARCAINE     SPECIMEN:  No Specimen  DISPOSITION OF SPECIMEN:  N/A  COUNTS:  YES  TOURNIQUET:   Total Tourniquet Time Documented: Thigh (Right) - 89 minutes  DICTATION: .Other Dictation: Dictation Number [id number]  PLAN OF CARE: Admit to inpatient   PATIENT DISPOSITION:  PACU - hemodynamically stable.   Delay start of Pharmacological VTE agent (>24hrs) due to surgical blood loss or risk of bleeding: yes

## 2011-12-05 NOTE — Anesthesia Preprocedure Evaluation (Signed)
Anesthesia Evaluation  Patient identified by MRN, date of birth, ID band Patient awake    Reviewed: Allergy & Precautions, H&P , NPO status , Patient's Chart, lab work & pertinent test results  Airway Mallampati: II TM Distance: <3 FB Neck ROM: Full    Dental No notable dental hx.    Pulmonary neg pulmonary ROS,  breath sounds clear to auscultation  Pulmonary exam normal       Cardiovascular hypertension, Pt. on medications Rhythm:Regular Rate:Normal     Neuro/Psych negative neurological ROS  negative psych ROS   GI/Hepatic Neg liver ROS, GERD-  Medicated,  Endo/Other  Diabetes mellitus-, Type 2, Oral Hypoglycemic Agents  Renal/GU negative Renal ROS  negative genitourinary   Musculoskeletal negative musculoskeletal ROS (+)   Abdominal   Peds negative pediatric ROS (+)  Hematology negative hematology ROS (+)   Anesthesia Other Findings   Reproductive/Obstetrics negative OB ROS                           Anesthesia Physical Anesthesia Plan  ASA: III  Anesthesia Plan: General   Post-op Pain Management:    Induction: Intravenous  Airway Management Planned: Oral ETT and LMA  Additional Equipment:   Intra-op Plan:   Post-operative Plan: Extubation in OR  Informed Consent: I have reviewed the patients History and Physical, chart, labs and discussed the procedure including the risks, benefits and alternatives for the proposed anesthesia with the patient or authorized representative who has indicated his/her understanding and acceptance.   Dental advisory given  Plan Discussed with: CRNA  Anesthesia Plan Comments:         Anesthesia Quick Evaluation

## 2011-12-05 NOTE — Anesthesia Postprocedure Evaluation (Signed)
  Anesthesia Post-op Note  Patient: Erin Beck  Procedure(s) Performed: Procedure(s) (LRB): TOTAL KNEE ARTHROPLASTY (Right)  Patient Location: PACU  Anesthesia Type: General  Level of Consciousness: awake and alert   Airway and Oxygen Therapy: Patient Spontanous Breathing  Post-op Pain: mild  Post-op Assessment: Post-op Vital signs reviewed, Patient's Cardiovascular Status Stable, Respiratory Function Stable, Patent Airway and No signs of Nausea or vomiting  Post-op Vital Signs: stable  Complications: No apparent anesthesia complications

## 2011-12-05 NOTE — Evaluation (Signed)
I have re-examined her today.  There are no changes from her written H and P in chart.  Teniola Tseng P. Taniela Feltus MD

## 2011-12-05 NOTE — Preoperative (Signed)
Beta Blockers   Reason not to administer Beta Blockers:Not Applicable 

## 2011-12-05 NOTE — Transfer of Care (Signed)
Immediate Anesthesia Transfer of Care Note  Patient: Erin Beck  Procedure(s) Performed: Procedure(s) (LRB): TOTAL KNEE ARTHROPLASTY (Right)  Patient Location: PACU  Anesthesia Type: General  Level of Consciousness: awake, alert , oriented, patient cooperative and responds to stimulation  Airway & Oxygen Therapy: Patient Spontanous Breathing and Patient connected to face mask oxygen  Post-op Assessment: Report given to PACU RN, Post -op Vital signs reviewed and stable and Patient moving all extremities  Post vital signs: Reviewed and stable  Complications: No apparent anesthesia complications

## 2011-12-06 LAB — HEMOGLOBIN AND HEMATOCRIT, BLOOD
HCT: 34.1 % — ABNORMAL LOW (ref 36.0–46.0)
Hemoglobin: 11.5 g/dL — ABNORMAL LOW (ref 12.0–15.0)

## 2011-12-06 LAB — GLUCOSE, CAPILLARY: Glucose-Capillary: 274 mg/dL — ABNORMAL HIGH (ref 70–99)

## 2011-12-06 MED ORDER — CEPASTAT 14.5 MG MT LOZG
1.0000 | LOZENGE | OROMUCOSAL | Status: DC | PRN
  Filled 2011-12-06: qty 18

## 2011-12-06 MED ORDER — MORPHINE SULFATE 2 MG/ML IJ SOLN
2.0000 mg | INTRAMUSCULAR | Status: DC | PRN
  Administered 2011-12-06 – 2011-12-07 (×6): 2 mg via INTRAVENOUS
  Filled 2011-12-06 (×6): qty 1

## 2011-12-06 MED ORDER — DIPHENHYDRAMINE HCL 25 MG PO CAPS: 25.0000 mg | ORAL_CAPSULE | Freq: Four times a day (QID) | ORAL | Status: DC | PRN

## 2011-12-06 MED ORDER — MORPHINE BOLUS VIA INFUSION: 2.0000 mg | INTRAVENOUS | Status: DC | PRN

## 2011-12-06 NOTE — Progress Notes (Signed)
  CARE MANAGEMENT NOTE 12/06/2011  Patient:  Erin Beck, Erin Beck   Account Number:  [id number]  Date Initiated:  12/06/2011  Documentation initiated by:  Colleen Can  Subjective/Objective Assessment:   dx osteoarthrits right knee; total knee replacemnt     Action/Plan:   CM spoke wiuth patient. Plans are for pt to return to her home where spouse will be caregiver. Already has RW, commode seat, shower seat. Plansto stay on first level of home and does have access to br   Anticipated DC Date:  12/08/2011   Anticipated DC Plan:  HOME W HOME HEALTH SERVICES  In-house referral  NA      DC Planning Services  CM consult      [facility] Choice  HOME HEALTH   Choice offered to / List presented to:  C-1 Patient   DME arranged  NA      DME agency  NA        [facility] agency  [facility]   Status of service:  In process, will continue to follow  Comments:  12/06/2011 Raynelle Bring BSN CCM [phone] Pt offered choice of [facility] agencies. !st choice [facility], 2nd choice [facility] was unable to provide services. [facility] was able to provide services. Pt chose [facility] List of agencies for [facility] services placed on shadow chART.  Face sheet, op note, H&p faxed to [phone] with confirmation. Will fax HH orders when written by MD.

## 2011-12-06 NOTE — Evaluation (Signed)
Physical Therapy Evaluation Patient Details Name: Erin Beck MRN: [id number] DOB: 1940/12/06 Today's Date: 12/06/2011  Problem List:  Patient Active Problem List  Diagnoses  . DIABETES MELLITUS, TYPE II  . HYPERLIPIDEMIA  . ANXIETY  . HYPERTENSION, BENIGN ESSENTIAL  . ALLERGIC RHINITIS  . OSTEOARTHRITIS  . DIVERTICULITIS, HX OF  . GERD  . TOBACCO USE, QUIT  . LLQ pain  . IBS (irritable bowel syndrome)  . Hematuria  . Obesity  . Other screening mammogram  . Preoperative examination    Past Medical History:  Past Medical History  Diagnosis Date  . Anxiety   . Diabetes mellitus     type II  . Diverticulitis     Hx of  . Hyperlipidemia   . Arthritis     OA  . Osteopenia   . GERD (gastroesophageal reflux disease)   . Hypertension   . Allergic rhinitis   . IBS (irritable bowel syndrome)   . Hematuria     HX OF MICROSCOPIC BLOOD IN URINE AND HX OF CYST ON ONE KIDNEY--BUT NO KIDNEY DISEASE   Past Surgical History:  Past Surgical History  Procedure Date  . Abdominal hysterectomy   . Cholecystectomy   . Knee arthroscopy 1990    chondromalacia   . Eye surgery     BILATERAL CATARACTS REMOVED  . Joint replacement 2000    LEFT TOTAL KNEE ARTHROPLASTY    PT Assessment/Plan/Recommendation PT Assessment Clinical Impression Statement: pt s/p R TKA and will benefit from PT to maximize independence for home with husband support PT Recommendation/Assessment: Patient will need skilled PT in the acute care venue PT Problem List: Decreased strength;Decreased range of motion;Decreased knowledge of use of DME;Decreased activity tolerance;Decreased mobility;Decreased knowledge of precautions;Pain Barriers to Discharge: None PT Therapy Diagnosis : Difficulty walking PT Plan PT Frequency: 7X/week PT Treatment/Interventions: DME instruction;Gait training;Stair training;Functional mobility training;Therapeutic activities;Therapeutic exercise;Patient/family education PT  Recommendation Follow Up Recommendations: Home health PT Equipment Recommended: None recommended by PT PT Goals  Acute Rehab PT Goals PT Goal Formulation: With patient Time For Goal Achievement: 7 days Pt will go Supine/Side to Sit: with supervision PT Goal: Supine/Side to Sit - Progress: Goal set today Pt will go Sit to Supine/Side: with supervision PT Goal: Sit to Supine/Side - Progress: Goal set today Pt will go Sit to Stand: with supervision PT Goal: Sit to Stand - Progress: Goal set today Pt will go Stand to Sit: with supervision PT Goal: Stand to Sit - Progress: Goal set today Pt will Ambulate: 51 - 150 feet;with supervision;with rolling walker PT Goal: Ambulate - Progress: Goal set today Pt will Go Up / Down Stairs: 1-2 stairs;with min assist;with rail(s) PT Goal: Up/Down Stairs - Progress: Goal set today Pt will Perform Home Exercise Program: with supervision, verbal cues required/provided PT Goal: Perform Home Exercise Program - Progress: Goal set today  PT Evaluation Precautions/Restrictions  Precautions Precautions: Knee Precaution Comments: no pillow under knee Required Braces or Orthoses: Knee Immobilizer - Right Knee Immobilizer - Right: Discontinue once straight leg raise with < 10 degree lag Restrictions RLE Weight Bearing: Weight bearing as tolerated Prior Functioning  Home Living Lives With: Spouse Available Help at Discharge: Family;Available 24 hours/day Type of Home: House Home Access: Stairs to enter [employer] of Steps: 2 Entrance Stairs-Rails: Right;Left;Can reach both Home Layout: Two level;Able to live on main level with bedroom/bathroom;1/2 bath on main level Home Adaptive Equipment: Walker - standard;Bedside commode/3-in-1;Shower chair with back Prior Function Level of Independence: Independent Driving:  Yes Vocation: Retired Comments: pt also taking care of 7yo grand daughter Cognition Cognition Overall Cognitive Status: Appears  within functional limits for tasks assessed/performed Arousal/Alertness: Awake/alert Orientation Level: Appears intact for tasks assessed Behavior During Session: [facility] for tasks performed Sensation/Coordination   Extremity Assessment RUE Assessment RUE Assessment: Within Functional Limits LUE Assessment LUE Assessment: Within Functional Limits RLE Assessment RLE Assessment:  (able to assist with SLR; ankle WFL) LLE Assessment LLE Assessment: Within Functional Limits Mobility (including Balance) Bed Mobility Supine to Sit: 3: Mod assist Supine to Sit Details (indicate cue type and reason): cues for scooting and use of UEs Sitting - Scoot to Edge of Bed: 4: Min assist;3: Mod assist Transfers Sit to Stand: 4: Min assist;From bed;Without upper extremity assist Sit to Stand Details (indicate cue type and reason): cues for hand placement;  Stand to Sit: 4: Min assist;To chair/3-in-1;With upper extremity assist Stand to Sit Details: cues for handplacemnt and RLE postion Ambulation/Gait Ambulation/Gait Assistance: 4: Min assist;3: Mod assist Ambulation/Gait Assistance Details (indicate cue type and reason): cues for sequence and wt shift; pt not able to tol much wt on RLE Ambulation Distance (Feet): 8 Feet Assistive device: Rolling walker Gait Pattern: Step-to pattern;Decreased stance time - right;Antalgic    Exercise  Total Joint Exercises Ankle Circles/Pumps: AROM;Both;10 reps End of Session PT - End of Session Equipment Utilized During Treatment: Right knee immobilizer Activity Tolerance: Patient limited by pain Patient left: in chair;with call bell in reach;with family/visitor present Nurse Communication: Mobility status for transfers;Mobility status for ambulation General Behavior During Session: [facility] for tasks performed Cognition: [facility] for tasks performed  [facility] 12/06/2011, 12:28 PM

## 2011-12-06 NOTE — Progress Notes (Signed)
Pt reported dizziness, trembling, and nausea. CBG checked-result 283. Bp 107/62 pulse 89 resp.16. Dilaudid 1 mg IV was given prior to events.

## 2011-12-06 NOTE — Progress Notes (Signed)
Utilization review completed.  

## 2011-12-06 NOTE — Progress Notes (Signed)
12/06/11 1400  PT Visit Information  Last PT Received On 12/06/11  Precautions  Precautions Knee  Precaution Comments no pillow under knee; pt tends to externally rotate Right hip, pillow placed to maintain neutral hip and knee extension  Required Braces or Orthoses Knee Immobilizer - Right  Knee Immobilizer - Right Discontinue once straight leg raise with < 10 degree lag  Restrictions  RLE Weight Bearing WBAT  Bed Mobility  Sit to Supine 4: Min assist;3: Mod assist;HOB flat  Sit to Supine - Details (indicate cue type and reason) cues for technique and positioning  Transfers  Sit to Stand 4: Min assist;With upper extremity assist;With armrests;From chair/3-in-1  Sit to Stand Details (indicate cue type and reason) cues for hand placement;   Stand to Sit 4: Min assist;To bed;With upper extremity assist  Stand to Sit Details cues for handplacemnt and RLE postion  Ambulation/Gait  Ambulation/Gait Assistance 4: Min assist;3: Mod assist  Ambulation/Gait Assistance Details (indicate cue type and reason) cues for sequence and wt shift; pt not able to tol much wt on RLE  Ambulation Distance (Feet) 7 Feet  Assistive device Rolling walker  Gait Pattern Step-to pattern;Decreased stance time - right;Antalgic  Total Joint Exercises  Ankle Circles/Pumps AROM;Both;10 reps  PT - End of Session  Equipment Utilized During Treatment Right knee immobilizer  Activity Tolerance Patient limited by pain  Patient left in bed;with call bell in reach;with family/visitor present  Nurse Communication Mobility status for transfers;Mobility status for ambulation  General  Behavior During Session [facility] for tasks performed  Cognition [facility] for tasks performed  PT - Assessment/Plan  Comments on Treatment Session pt continues to be limited by pain, unable to amb further than a few feet and unable to complete ther ex.  Ice to right knee  PT Plan Discharge plan remains appropriate;Frequency remains appropriate  PT  Frequency 7X/week  Follow Up Recommendations Home health PT;Skilled nursing facility (vs. depending on progress)  Equipment Recommended None recommended by PT  Acute Rehab PT Goals  Pt will go Sit to Supine/Side with supervision  PT Goal: Sit to Supine/Side - Progress Progressing toward goal  Pt will go Sit to Stand with supervision  PT Goal: Sit to Stand - Progress Progressing toward goal  Pt will go Stand to Sit with supervision  PT Goal: Stand to Sit - Progress Progressing toward goal  Pt will Ambulate 51 - 150 feet;with supervision;with rolling walker  PT Goal: Ambulate - Progress Progressing toward goal  Pt will Perform Home Exercise Program with supervision, verbal cues required/provided  PT Goal: Perform Home Exercise Program - Progress Not progressing

## 2011-12-06 NOTE — Op Note (Signed)
NAMEMarland Kitchen  Erin Beck, Erin Beck NO.:  [id number]  MEDICAL RECORD NO.:  [id number]  LOCATION:  1611                         FACILITY:  [facility]  PHYSICIAN:  Marlowe Kays, M.D.  DATE OF BIRTH:  [birth year]  DATE OF PROCEDURE:  12/05/2011 DATE OF DISCHARGE:                              OPERATIVE REPORT   PREOPERATIVE DIAGNOSIS:  End-stage osteoarthritis, right knee.  POSTOPERATIVE DIAGNOSIS:  End-stage osteoarthritis, right knee.  OPERATION:  Osteonics total knee replacement, right.  SURGEON:  Marlowe Kays, M.D.  ASSISTANT:  Georges Lynch. Darrelyn Hillock, M.D.  ANESTHESIA:  General.  PATHOLOGY AND JUSTIFICATION FOR PROCEDURE:  She had a successful total knee replacement on the left 13 years ago on the right knee.  She had bone-on-bone abutment medially and significant disabling pain.  She had such a good result from her left knee that she would like to try and have her right knee performed as well.  PROCEDURE IN DETAIL:  Satisfactory general anesthesia, prophylactic antibiotics, Foley catheter inserted, Surefoot lateral hip stabilizer, and pneumatic tourniquet, her right leg was prepped with Betadine from tourniquet to ankle and draped in sterile field.  Ioban employed.  Leg was Esmarched out sterilely and tourniquet inflated to 300 mmHg.  Time- out performed.  Vertical midline incision down to the patellar mechanism with median and parapatellar incision to open the joint.  The pes anserinus and medial collateral ligament were undermined off the proximal tibia.  The joint was opened and patella everted and knee flexed.  Osteophytes removed from around the femur and the patella and portions of the menisci and ACL, PCL were removed.  I then made a 5/16th inch drill hole in the distal femur followed by the canal finder and the axis liner for performing a 5-degree valgus distal femoral cut.  After making this cut, I then sized the distal femur at a 5.  Scribe lines were placed on  the cut femur and I then placed the distal femoral cutting jig and made set for a 5 and made anterior and posterior cuts and posterior and anterior chamferings.  Then, I went to the proximal tibia where I sized it at a 3 and using the base plate, made my initial intramedullary drill hole followed by step-cut drill and canal finder. I then placed intramedullary rod with the aligning mechanism for making a 0 degree cut, 4-mm off the depressed medial tibia.  This cut was made. I then placed the lamina spreader and removed remnants of bone and soft tissue from behind the femoral condyles.  I then placed the guide for creating both the notchplasty and the groove for the patella.  Finally, this went through a trial reduction, found that a 10-mm spacer, possibly a 12 would be adequate.  I used the base plate and the external rod splitting the bimalleolar distance to make scribe lines on the anterior tibia.  Then while the knee was in extension, I used the 10-mm recess cutting jig to make a 10-mm recess cut on her patella.  Following this, by placing a trial patella and trimming around the perimeter, I then went to the tibia once again, and using the number 3 size base plate, we fixed this  on the tibia with 3 pins and I then used the tripod apparatus to ream for the tibial stem.  After this is reamed, we went through a trial reduction and found that a 12-mm spacer actually seemed to give better range of motion with neutral extension.  With this in mind, I went ahead and water-picked the knee while the instruments were opened and methylmethacrylate mixed.  I then individually glued in the components starting first with the tibia followed by the femur, impacting and removing excess methylmethacrylate.  I moved the 10-mm spacer in place and the knee in extension.  I glued in the patella holding it with a patellar holding clamp.  When the methacrylate hardened, I removed the patellar holding clamp and  we trimmed up excess methylmethacrylate from around the components and placed the final 12-mm posterior stabilized spacer with excellent knee motion.  Soft tissues were infiltrated with 0.25% Marcaine with adrenaline.  Hemovac was placed.  Gelfoam was also placed in the distal femoral hole.  Wound was then closed over the Hemovac in layers with interrupted #1 Vicryl in the quadriceps tendon distally in the capsule, 2-0 Vicryl and subcutaneous tissue staples in the skin.  Betadine, Adaptic, dry sterile dressing were applied.  Tourniquet was released.  She tolerated the procedure well, was taken to the recovery room in satisfactory condition with no known complications and essentially no blood loss.          ______________________________ Marlowe Kays, M.D.     JA/MEDQ  D:  12/05/2011  T:  12/06/2011  Job:  [id number]

## 2011-12-06 NOTE — Progress Notes (Signed)
Patient ID: Erin Beck, female   DOB: 02-Apr-1941, 71 y.o.   MRN: [id number] Hgb pending.  Hemovac out.  Sick on dilaudid--will switch to MS  [place]

## 2011-12-07 LAB — HEMOGLOBIN AND HEMATOCRIT, BLOOD
HCT: 31.6 % — ABNORMAL LOW (ref 36.0–46.0)
Hemoglobin: 10.7 g/dL — ABNORMAL LOW (ref 12.0–15.0)

## 2011-12-07 LAB — GLUCOSE, CAPILLARY

## 2011-12-07 NOTE — Progress Notes (Signed)
12/07/2011 Raynelle Bring BSN CCM [phone] Per physical notes pt had lack of progression today; snf /24 hr asistance was recommended. CSW notified of referral; CM will continue to follow as needed.

## 2011-12-07 NOTE — Progress Notes (Signed)
Physical Therapy Treatment Patient Details Name: Erin Beck MRN: [id number] DOB: Mar 30, 1941 Today's Date: 12/07/2011 Time: 1205-1230 PT Time Calculation (min): 25 min  PT Assessment / Plan / Recommendation Comments on Treatment Session  Pt up unassisted upon entering, in unsafe situation without KI and with lines and house slippers. Pt rather resistant to following instruction for ther-ex and gait. Will try youth RW this PM and see if pt is able to walk greater distance. Pt is limited by pain and not able to tolerate much flexion or ext ther ex. Pt educated on positioning with KI and no pillows under knee. Bed locked in LE extention. Based on lack of progress at this time SNF may be safest option for pt.     Follow Up Recommendations  Skilled nursing facility;Home health PT;Supervision/Assistance - 24 hour    Equipment Recommendations  None recommended by PT    Frequency 7X/week    Precautions / Restrictions Precautions Precautions: Knee Precaution Comments: Locked pt's bed in extended LE position Required Braces or Orthoses: Knee Immobilizer - Right Knee Immobilizer - Right: Discontinue once straight leg raise with < 10 degree lag Restrictions Weight Bearing Restrictions: Yes RLE Weight Bearing: Weight bearing as tolerated   Pertinent Vitals/Pain 7/10 pain, repositioned    Mobility  Transfers Sit to Stand: 4: Min assist;From elevated surface;From bed;From chair/3-in-1;With armrests Stand to Sit: 4: Min assist;With upper extremity assist;With armrests;To chair/3-in-1;To bed Details for Transfer Assistance: Cues for safe hand placement and to back up fully with RW.  Ambulation/Gait Ambulation/Gait Assistance: 3: Mod assist Ambulation Distance (Feet): 6 Feet Assistive device: Rolling walker Ambulation/Gait Assistance Details: Pt unable to place weight on RLE at this time, stating that the RW is too tall for her to do so. Thus, she only scoots along LLE in all directions. Pt  unable to adjust gait with verbal and visual cues.  Gait Pattern: Step-to pattern;Decreased stance time - right;Antalgic Stairs: No    Exercises Total Joint Exercises Ankle Circles/Pumps: AROM;Right;10 reps Heel Slides: AAROM;Right;5 reps;Seated Long Arc Quad: AAROM;Right;5 reps;Seated Knee Flexion: AAROM;Right;5 reps;Seated   PT Goals Acute Rehab PT Goals PT Goal: Sit to Stand - Progress: Progressing toward goal PT Goal: Stand to Sit - Progress: Progressing toward goal PT Goal: Ambulate - Progress: Progressing toward goal PT Goal: Perform Home Exercise Program - Progress: Progressing toward goal  Visit Information  Last PT Received On: 12/07/11    Subjective Data  Subjective: "I can't move my leg." Pt up with husband, trying to get to chair unassisted with lines around her. Assisted pt to sitting and remimded her to call for assist for anything.    Cognition  Overall Cognitive Status: Appears within functional limits for tasks assessed/performed Arousal/Alertness: Awake/alert Orientation Level: Appears intact for tasks assessed Behavior During Session: [facility] for tasks performed        End of Session PT - End of Session Equipment Utilized During Treatment: Gait belt;Right knee immobilizer Activity Tolerance: Patient limited by pain Patient left: in chair;with call bell/phone within reach;with family/visitor present Nurse Communication: Mobility status    Rex Magee M,PT 12/07/2011, 1:33 PM

## 2011-12-07 NOTE — Progress Notes (Signed)
Physical Therapy Treatment Patient Details Name: Erin Beck MRN: [id number] DOB: 1941-02-11 Today's Date: 12/07/2011 Time: 2130-8657 PT Time Calculation (min): 25 min  PT Assessment / Plan / Recommendation Comments on Treatment Session  Pt continues to be limited by pain for ther-ex, but is will to do some standing and seated ex this tx. Gait progressed greatly this PM with youth RW. Consider focusing on there-ex next tx as able. Discussed SNF vs home with pt and she is strongly against SNF. Explained that we will need to begin more home simulation if that is the case. Pt seemed to understand and increase participation. Encouragement is needed as well as choices in therapy.     Follow Up Recommendations  Skilled nursing facility;Home health PT;Supervision/Assistance - 24 hour    Equipment Recommendations  None recommended by PT    Frequency 7X/week   Plan Discharge plan remains appropriate;Frequency remains appropriate    Precautions / Restrictions Precautions Precautions: Knee Precaution Comments: Locked pt's bed in extended LE position Required Braces or Orthoses: Knee Immobilizer - Right Knee Immobilizer - Right: Discontinue once straight leg raise with < 10 degree lag Restrictions Weight Bearing Restrictions: Yes RLE Weight Bearing: Weight bearing as tolerated   Pertinent Vitals/Pain 7/10 premedicated    Mobility  Bed Mobility Bed Mobility: Supine to Sit Supine to Sit: 3: Mod assist Sitting - Scoot to Edge of Bed: 4: Min assist;3: Mod assist Details for Bed Mobility Assistance: Cues for LE technique and sequence, but pt insists on raising HOB ~60degrees. Explained that we will need to simulate home environment in order to be able to be comfortable recommending home with PT. Pt exhibits pain behaviors with scooting hips with pad. Assist needed with RLE lifting and trunk Transfers Sit to Stand: 4: Min assist;With upper extremity assist;From chair/3-in-1;From toilet;With  armrests Stand to Sit: 4: Min assist;With upper extremity assist;To toilet;To bed;With armrests Details for Transfer Assistance: Pt demonstrates unsafe techniques, including crossing RLE over left while standing and sitting, btu is resistent to safety cues. Also places hands on lower bars of RW to press up. Ambulation/Gait Ambulation/Gait Assistance: 4: Min assist Ambulation Distance (Feet): 30 Feet Assistive device: Rolling walker Ambulation/Gait Assistance Details: Pt initially reisting placing weight on RLE, but willing after much encouragement and education. Steadying assist and cues for heel strike as able.  Gait Pattern: Step-to pattern;Decreased stance time - right;Antalgic Stairs: No    Exercises Total Joint Exercises Ankle Circles/Pumps: AROM;Right;10 reps Quad Sets: Standing;5 reps;Right;Strengthening;AROM (To encourage knee ext ) Heel Slides: AAROM;Right;5 reps;Seated Long Arc Quad: AAROM;Right;5 reps;Seated Knee Flexion: AAROM;Right;5 reps;Seated Marching in Standing: AROM;Right;5 reps;Standing   PT Goals Acute Rehab PT Goals PT Goal: Supine/Side to Sit - Progress: Progressing toward goal PT Goal: Sit to Stand - Progress: Progressing toward goal PT Goal: Stand to Sit - Progress: Progressing toward goal PT Goal: Ambulate - Progress: Progressing toward goal PT Goal: Perform Home Exercise Program - Progress: Progressing toward goal  Visit Information  Last PT Received On: 12/07/11    Subjective Data  Subjective: "I can't move my leg." Pt up with husband, trying to get to chair unassisted with lines around her. Assisted pt to sitting and remimded her to call for assist for anything.  Patient Stated Goal: Go home, not going to rehab!    Cognition  Overall Cognitive Status: Appears within functional limits for tasks assessed/performed Arousal/Alertness: Awake/alert Orientation Level: Appears intact for tasks assessed Behavior During Session: [facility] for tasks  performed Cognition - Other Comments:  Pt demonstrates some decreased safety awareness and problem solving        End of Session PT - End of Session Equipment Utilized During Treatment: Gait belt;Right knee immobilizer Activity Tolerance: Patient limited by pain Patient left: in bed;with call bell/phone within reach;with bed alarm set (EOB, nurse tech outside door and made aware) Nurse Communication: Mobility status    Virl Cagey, PT 12/07/2011, 4:07 PM

## 2011-12-07 NOTE — Progress Notes (Signed)
CSW met with patient and spouse at bedside. Discussed need for SNF. Patient adamantly refuses SNF and states that she will go home upon discharge. CSW discussed risks and benefits. Patient continues to refuse. No other social work needs noted. Khiara Shuping C. Roselyn Doby MSW, LCSW [phone]

## 2011-12-07 NOTE — Progress Notes (Signed)
Patient ID: Erin Beck, female   DOB: [birth year], 71 y.o.   MRN: [id number] Po day 2.  Afebrile.  Dressing changed--wound healing nicely--clean and dry.  hgb stable at 11.6.  Home when able.  Denisse Whitenack

## 2011-12-08 LAB — GLUCOSE, CAPILLARY: Glucose-Capillary: 152 mg/dL — ABNORMAL HIGH (ref 70–99)

## 2011-12-08 NOTE — Progress Notes (Signed)
Physical Therapy Treatment Patient Details Name: Erin Beck MRN: [id number] DOB: Apr 19, 1941 Today's Date: 12/08/2011 Time: 0931-1010 PT Time Calculation (min): 39 min  PT Assessment / Plan / Recommendation Comments on Treatment Session  pt tolerated well, improved w/ mobility, rom, still w nause    Follow Up Recommendations  Home health PT    Equipment Recommendations  None recommended by PT    Frequency     Plan Discharge plan remains appropriate    Precautions / Restrictions Precautions Precautions: Knee Required Braces or Orthoses: Knee Immobilizer - Right Knee Immobilizer - Right: Discontinue once straight leg raise with < 10 degree lag Restrictions Weight Bearing Restrictions: No RLE Weight Bearing: Weight bearing as tolerated   Pertinent Vitals/Pain 4/10    Mobility  Bed Mobility Supine to Sit: 4: Min assist;HOB flat Sitting - Scoot to Edge of Bed: 5: Supervision  Details for Bed Mobility Assistance: vc to use L foot to place RLE onto bed Transfers Transfers: Sit to Stand;Stand to Sit Sit to Stand: 5: Supervision;From bed;From chair/3-in-1;With upper extremity assist Stand to Sit: 5: Supervision;To chair/3-in-1;To bed;With upper extremity assist Details for Transfer Assistance:  vc to push from surfaces and to reach back to bed/srmrests. pt able to manage RLE w/ KI, mildly impulsive Ambulation/Gait Ambulation/Gait Assistance: 4: Min assist Ambulation Distance (Feet): 50 Feet Assistive device: Rolling walker Ambulation/Gait Assistance Details:  vxc to roll walker, posture. pt was limited by c/o feeling dizzy so returned to room. Gait Pattern: Step-to pattern Gait velocity: WFL for RW    Exercises Total Joint Exercises Quad Sets: AROM;10 reps;Supine;Both Heel Slides: AAROM;Right;10 reps;Supine Hip ABduction/ADduction: AROM;Right;10 reps;Supine Straight Leg Raises: AAROM;Right;10 reps;Supine   PT Goals Acute Rehab PT Goals PT Goal Formulation: With  patient Time For Goal Achievement: 12/09/11 Potential to Achieve Goals: Good Pt will go Supine/Side to Sit: with supervision PT Goal: Supine/Side to Sit - Progress: Progressing toward goal Pt will go Sit to Stand: with supervision PT Goal: Sit to Stand - Progress: Progressing toward goal Pt will go Stand to Sit: with supervision PT Goal: Stand to Sit - Progress: Progressing toward goal Pt will Ambulate: 16 - 50 feet PT Goal: Ambulate - Progress: Progressing toward goal Pt will Perform Home Exercise Program: with supervision, verbal cues required/provided PT Goal: Perform Home Exercise Program - Progress: Progressing toward goal  Visit Information  Last PT Received On: 12/08/11 Assistance Needed: +1    Subjective Data  Subjective: i am so sleepy,. I am doing better today   Cognition  Overall Cognitive Status: Appears within functional limits for tasks assessed/performed Arousal/Alertness: Awake/alert Behavior During Session: [facility] for tasks performed    Balance     End of Session PT - End of Session Equipment Utilized During Treatment: Right knee immobilizer Activity Tolerance: Patient tolerated treatment well Patient left: in bed;with call bell/phone within reach;with family/visitor present Nurse Communication: Mobility status CPM Right Knee CPM Right Knee: Off    Rada Hay 12/08/2011, 11:33 AM

## 2011-12-08 NOTE — Progress Notes (Signed)
Physical Therapy Treatment Patient Details Name: Erin Beck MRN: [id number] DOB: 1940/08/29 Today's Date: 12/08/2011 Time: 5784-6962 PT Time Calculation (min): 18 min  PT Assessment / Plan / Recommendation Comments on Treatment Session  pt had nmore difficulty w/ supine to sit. does well w/ gait. plan DC in am. after PT/steps    Follow Up Recommendations  Home health PT    Equipment Recommendations  Rolling walker with 5" wheels( has received)   Frequency 7X/week   Plan Discharge plan remains appropriate    Precautions / Restrictions Precautions Precautions: Knee Required Braces or Orthoses: Knee Immobilizer - Right Knee Immobilizer - Right: Discontinue once straight leg raise with < 10 degree lag Restrictions Weight Bearing Restrictions: No   Pertinent Vitals/Pain 5/10 RN notified R knee    Mobility  Bed Mobility Supine to Sit: 4: Min assist;HOB flat;With rails Sitting - Scoot to Edge of Bed: 5: Supervision;With rail Sit to Supine: 5: Supervision;HOB flat Details for Bed Mobility Assistance: pt in more pain and took more time for mobility and used rails Transfers Transfers: Sit to Stand;Stand to Sit Sit to Stand: 5: Supervision;From bed;From chair/3-in-1;With upper extremity assist Stand to Sit: 5: Supervision;To chair/3-in-1;To bed;With upper extremity assist Details for Transfer Assistance:  vc to push from surfaces and to reach back to bed/srmrests. pt able to manage RLE w/ KI, mildly impulsive Ambulation/Gait Ambulation/Gait Assistance: 4: Min assist Ambulation Distance (Feet): 50 Feet Assistive device: Rolling walker Ambulation/Gait Assistance Details:  vxc to roll walker, posture. pt was limited by c/o feeling dizzy so returned to room. Gait Pattern: Step-to pattern Gait velocity: WFL for RW    Exercises    PT Goals Acute Rehab PT Goals PT Goal Formulation: With patient Time For Goal Achievement: 12/09/11 Potential to Achieve Goals: Good Pt will go  Supine/Side to Sit: with supervision PT Goal: Supine/Side to Sit - Progress: Progressing toward goal Pt will go Sit to Supine/Side: with supervision PT Goal: Sit to Supine/Side - Progress: Progressing toward goal Pt will go Sit to Stand: with supervision PT Goal: Sit to Stand - Progress: Progressing toward goal Pt will go Stand to Sit: with supervision PT Goal: Stand to Sit - Progress: Progressing toward goal Pt will Ambulate: 16 - 50 feet PT Goal: Ambulate - Progress: Progressing toward goal Pt will Perform Home Exercise Program: with supervision, verbal cues required/provided PT Goal: Perform Home Exercise Program - Progress: Progressing toward goal  Visit Information  Last PT Received On: 12/08/11 Assistance Needed: +1    Subjective Data  Subjective: I can't walk w/o the KI   Cognition  Overall Cognitive Status: Appears within functional limits for tasks assessed/performed Arousal/Alertness: Awake/alert Orientation Level: Appears intact for tasks assessed Behavior During Session: [facility] for tasks performed    Balance     End of Session PT - End of Session Equipment Utilized During Treatment: Right knee immobilizer Activity Tolerance: Patient tolerated treatment well Patient left: in bed;with call bell/phone within reach;with family/visitor present Nurse Communication: Mobility status     Rada Hay 12/08/2011, 2:56 PM [phone]

## 2011-12-08 NOTE — Progress Notes (Signed)
Subjective: 3 Days Post-Op Procedure(s) (LRB): TOTAL KNEE ARTHROPLASTY (Right) Patient reports pain as mild.   Patient seen in rounds with Dr. Lequita Halt. Patient has complaints of continued pain and not progressed well.  Needs one more day.  Probably home tomorrow.  Objective: Vital signs in last 24 hours: Temp:  [97.9 F (36.6 C)-99 F (37.2 C)] 97.9 F (36.6 C) (04/20 0615) Pulse Rate:  [85-95] 95  (04/20 0615) Resp:  [16-20] 16  (04/20 0615) BP: (109-143)/(67-76) 109/67 mmHg (04/20 0615) SpO2:  [91 %-97 %] 91 % (04/20 0615)  Intake/Output from previous day:  Intake/Output Summary (Last 24 hours) at 12/08/11 0848 Last data filed at 12/08/11 0615  Gross per 24 hour  Intake 1597.33 ml  Output   1600 ml  Net  -2.67 ml    Intake/Output this shift:    Labs:  Basename 12/07/11 0800 12/06/11 0811 12/05/11 1752  HGB 10.7* 11.5* 12.5    Basename 12/07/11 0800 12/06/11 0811  WBC -- --  RBC -- --  HCT 31.6* 34.1*  PLT -- --   No results found for this basename: NA:2,K:2,CL:2,CO2:2,BUN:2,CREATININE:2,GLUCOSE:2,CALCIUM:2 in the last 72 hours No results found for this basename: LABPT:2,INR:2 in the last 72 hours  Exam - Neurovascular intact Sensation intact distally Dressing/Incision - clean, dry, no drainage Motor function intact - moving foot and toes well on exam.   Past Medical History  Diagnosis Date  . Anxiety   . Diabetes mellitus     type II  . Diverticulitis     Hx of  . Hyperlipidemia   . Arthritis     OA  . Osteopenia   . GERD (gastroesophageal reflux disease)   . Hypertension   . Allergic rhinitis   . IBS (irritable bowel syndrome)   . Hematuria     HX OF MICROSCOPIC BLOOD IN URINE AND HX OF CYST ON ONE KIDNEY--BUT NO KIDNEY DISEASE    Assessment/Plan: 3 Days Post-Op Procedure(s) (LRB): TOTAL KNEE ARTHROPLASTY (Right) Active Problems:  * No active hospital problems. *    Up with therapy  DVT Prophylaxis - Xarelto Weight-Bearing as tolerated  to right leg  Nazia Rhines 12/08/2011, 8:48 AM

## 2011-12-09 MED ORDER — RIVAROXABAN 10 MG PO TABS: 10.0000 mg | ORAL_TABLET | Freq: Every day | ORAL | Status: DC

## 2011-12-09 MED ORDER — BISACODYL 10 MG RE SUPP
10.0000 mg | Freq: Once | RECTAL | Status: AC
  Administered 2011-12-09: 10 mg via RECTAL
  Filled 2011-12-09: qty 1

## 2011-12-09 MED ORDER — METHOCARBAMOL 500 MG PO TABS: 500.0000 mg | ORAL_TABLET | Freq: Four times a day (QID) | ORAL | Status: AC | PRN

## 2011-12-09 MED ORDER — OXYCODONE-ACETAMINOPHEN 5-325 MG PO TABS: 1.0000 | ORAL_TABLET | ORAL | Status: AC | PRN

## 2011-12-09 NOTE — Progress Notes (Signed)
Cm spoke with pt with spouse at bedside concerning dc planning. Pt confirmed previous dc plan with [facility] providing [facility] services. DME for home use present at bedside. CM to notify agency of dc. Spouse to assist in home care.   Leonie Green [phone]

## 2011-12-09 NOTE — Progress Notes (Signed)
Physical Therapy Treatment Patient Details Name: Erin Beck MRN: [id number] DOB: July 17, 1941 Today's Date: 12/09/2011 Time: 4098-1191 PT Time Calculation (min): 23 min  PT Assessment / Plan / Recommendation Comments on Treatment Session  pt is improved in mobility / will practice  steps soon then DC    Follow Up Recommendations  Home health PT    Equipment Recommendations  Rolling walker with 5" wheels    Frequency 7X/week   Plan Discharge plan remains appropriate    Precautions / Restrictions Precautions Precautions: Knee Required Braces or Orthoses: Knee Immobilizer - Right Knee Immobilizer - Right: Discontinue once straight leg raise with < 10 degree lag   Pertinent Vitals/Pain 3/10   Mobility  Bed Mobility Bed Mobility: Supine to Sit;Sit to Supine Supine to Sit: 4: Min assist;HOB flat;With rails;4: Min guard Sitting - Scoot to [place] of Bed: 5: Supervision Sit to Supine: 5: Supervision;HOB flat Details for Bed Mobility Assistance: pt is in a hurry to go to BR so let pt use rail Transfers Transfers: Sit to Stand;Stand to Sit Sit to Stand: 5: Supervision;From bed;From chair/3-in-1;With upper extremity assist Stand to Sit: 5: Supervision;To chair/3-in-1;To bed;With upper extremity assist Details for Transfer Assistance:  vc to push from surfaces and to reach back to bed/srmrests. pt able to manage RLE w/ KI, Ambulation/Gait Ambulation/Gait Assistance: 4: Min assist Ambulation Distance (Feet): 20 Feet (x 2) Assistive device: Rolling walker Ambulation/Gait Assistance Details: pt is improved in mobility and gait. will wait on steps until after BR duty Gait Pattern: Step-to pattern Gait velocity: WFL for RW    Exercises     PT Goals Acute Rehab PT Goals PT Goal Formulation: With patient Time For Goal Achievement: 12/09/11 Potential to Achieve Goals: Good Pt will go Supine/Side to Sit: with supervision PT Goal: Supine/Side to Sit - Progress: Progressing toward  goal Pt will go Sit to Supine/Side: with supervision PT Goal: Sit to Supine/Side - Progress: Progressing toward goal Pt will go Sit to Stand: with supervision PT Goal: Sit to Stand - Progress: Met Pt will go Stand to Sit: with supervision PT Goal: Stand to Sit - Progress: Met Pt will Ambulate: 16 - 50 feet PT Goal: Ambulate - Progress: Progressing toward goal Pt will Perform Home Exercise Program: with supervision, verbal cues required/provided PT Goal: Perform Home Exercise Program - Progress: Progressing toward goal  Visit Information  Last PT Received On: 12/09/11 Assistance Needed: +1    Subjective Data  Subjective: I am ready to go home   Cognition  Overall Cognitive Status: Appears within functional limits for tasks assessed/performed Arousal/Alertness: Awake/alert Orientation Level: Appears intact for tasks assessed Behavior During Session: [facility] for tasks performed    Balance     End of Session PT - End of Session Equipment Utilized During Treatment: Right knee immobilizer Activity Tolerance: Patient tolerated treatment well Patient left: in bed;with call bell/phone within reach;with family/visitor present Nurse Communication: Mobility status    Rada Hay 12/09/2011, 1:03 PM  [phone]

## 2011-12-09 NOTE — Progress Notes (Signed)
Subjective: 4 Days Post-Op Procedure(s) (LRB): TOTAL KNEE ARTHROPLASTY (Right) Patient reports pain as mild.   Patient is better today.  Husband in room. Progressing with therapy.  Ready to go home today.  Objective: Vital signs in last 24 hours: Temp:  [98.6 F (37 C)-99.2 F (37.3 C)] 99 F (37.2 C) (04/21 0538) Pulse Rate:  [83-97] 93  (04/21 0538) Resp:  [16] 16  (04/21 0538) BP: (113-129)/(65-71) 113/71 mmHg (04/21 0538) SpO2:  [91 %-94 %] 93 % (04/21 0538)  Intake/Output from previous day:  Intake/Output Summary (Last 24 hours) at 12/09/11 0849 Last data filed at 12/09/11 1610  Gross per 24 hour  Intake    720 ml  Output   2050 ml  Net  -1330 ml    Intake/Output this shift:    Labs:  Basename 12/07/11 0800  HGB 10.7*    Basename 12/07/11 0800  WBC --  RBC --  HCT 31.6*  PLT --   No results found for this basename: NA:2,K:2,CL:2,CO2:2,BUN:2,CREATININE:2,GLUCOSE:2,CALCIUM:2 in the last 72 hours No results found for this basename: LABPT:2,INR:2 in the last 72 hours  Exam: Neurovascular intact Sensation intact distally Incision - clean, dry, no drainage Motor function intact - moving foot and toes well on exam.   Assessment/Plan: 4 Days Post-Op Procedure(s) (LRB): TOTAL KNEE ARTHROPLASTY (Right) Procedure(s) (LRB): TOTAL KNEE ARTHROPLASTY (Right) Past Medical History  Diagnosis Date  . Anxiety   . Diabetes mellitus     type II  . Diverticulitis     Hx of  . Hyperlipidemia   . Arthritis     OA  . Osteopenia   . GERD (gastroesophageal reflux disease)   . Hypertension   . Allergic rhinitis   . IBS (irritable bowel syndrome)   . Hematuria     HX OF MICROSCOPIC BLOOD IN URINE AND HX OF CYST ON ONE KIDNEY--BUT NO KIDNEY DISEASE   Active Problems:  * No active hospital problems. *    Up with therapy Discharge home with home health Diet - heart healthy Follow up - in 2 weeks Activity - WBAT Disposition - Home Condition Upon Discharge -  Good D/C Meds - See DC Summary DVT Prophylaxis - Xarelto  Ariann Khaimov 12/09/2011, 8:49 AM

## 2011-12-09 NOTE — Discharge Instructions (Signed)
Knee Rehabilitation, Guidelines Following Surgery Results after knee surgery are often greatly improved when you follow the exercise, range of motion and muscle strengthening exercises prescribed by your doctor. Safety measures are also important to protect the knee from further injury. Any time any of these exercises cause you to have increased pain or swelling in your knee joint, decrease the amount until you are comfortable again and slowly increase them. If you have problems or questions, call your caregiver or physical therapist for advice. HOME CARE INSTRUCTIONS   Remove items at home which could result in a fall. This includes throw rugs or furniture in walking pathways.   Continue medications as instructed.   You may shower or take tub baths when your staples or stitches are removed or as instructed.   Walk using crutches or walker as instructed.   Put weight on your legs and walk as much as is comfortable.   You may resume a sexual relationship in one month or when given the OK by your doctor.   Return to work as instructed by your doctor.   Do not drive a car for 6 weeks or as instructed.   Wear elastic stockings until instructed not to.   Make sure you keep all of your appointments after your operation with all of your doctors and caregivers.  RANGE OF MOTION AND STRENGTHENING EXERCISES Rehabilitation of the knee is important following a knee injury or an operation. After just a few days of immobilization, the muscles of the thigh which control the knee become weakened and shrink (atrophy). Knee exercises are designed to build up the tone and strength of the thigh muscles and to improve knee motion. Often times heat used for twenty to thirty minutes before working out will loosen up your tissues and help with improving the range of motion. These exercises can be done on a training (exercise) mat, on the floor, on a table or on a bed. Use what ever works the best and is most  comfortable for you Knee exercises include:  Leg Lifts - While your knee is still immobilized in a splint or cast, you can do straight leg raises. Lift the leg to 60 degrees, hold for 3 sec, and slowly lower the leg. Repeat 10-20 times 2-3 times daily. Perform this exercise against resistance later as your knee gets better.   Quad and Hamstring Sets - Tighten up the muscle on the front of the thigh (Quad) and hold for 5-10 sec. Repeat this 10-20 times hourly. Hamstring sets are done by pushing the foot backward against an object and holding for 5-10 sec. Repeat as with quad sets.  A rehabilitation program following serious knee injuries can speed recovery and prevent re-injury in the future due to weakened muscles. Contact your doctor or a physical therapist for more information on knee rehabilitation. MAKE SURE YOU:   Understand these instructions.   Will watch your condition.   Will get help right away if you are not doing well or get worse.  Document Released: 08/06/2005 Document Revised: 07/26/2011 Document Reviewed: 01/24/2007 [employer] Patient Information 2012 [employer]  Pick up stool softner and laxative for home. Do not submerge incision under water. May shower. Continue to use ice for pain and swelling from surgery.  

## 2011-12-09 NOTE — Progress Notes (Signed)
Physical Therapy Treatment Patient Details Name: Erin Beck MRN: [id number] DOB: 10-Jul-1941 Today's Date: 12/09/2011 Time: 9528-4132 PT Time Calculation (min): 32 min  PT Assessment / Plan / Recommendation Comments on Treatment Session  pt is improved in mobility / will practice  steps soon then DC    Follow Up Recommendations  Home health PT    Equipment Recommendations  Rolling walker with 5" wheels    Frequency 7X/week   Plan Discharge plan remains appropriate    Precautions / Restrictions Precautions Precautions: Knee Required Braces or Orthoses: Knee Immobilizer - Right Knee Immobilizer - Right: Discontinue once straight leg raise with < 10 degree lag   Pertinent Vitals/Pain 3/10    Mobility  Bed Mobility Bed Mobility: Supine to Sit;Sit to Supine Supine to Sit: 5: Supervision Sitting - Scoot to Edge of Bed: 5: Supervision Sit to Supine: 5: Supervision;HOB flat Details for Bed Mobility Assistance: rail w/ HOB elevated  Transfers Transfers: Sit to Stand;Stand to Sit Sit to Stand: 5: Supervision;From bed;From chair/3-in-1;With upper extremity assist Stand to Sit: 5: Supervision;To chair/3-in-1;To bed;With upper extremity assist Details for Transfer Assistance:  vc to push from surfaces and to reach back to bed/srmrests. pt able to manage RLE w/ KI, Ambulation/Gait Ambulation/Gait Assistance: 4: Min assist Ambulation Distance (Feet): 20 Feet (x 2) Assistive device: Rolling walker Ambulation/Gait Assistance Details: pt is improved in mobility and gait. will wait on steps until after BR duty Gait Pattern: Step-to pattern Gait velocity: WFL for RW Stairs: Yes Stairs Assistance: 4: Min guard Stair Management Technique: One rail Right;Step to pattern;Backwards Number of Stairs: 2     Exercises     PT Goals Acute Rehab PT Goals PT Goal Formulation: With patient Time For Goal Achievement: 12/09/11 Potential to Achieve Goals: Good Pt will go Supine/Side to  Sit: with supervision PT Goal: Supine/Side to Sit - Progress: Progressing toward goal Pt will go Sit to Supine/Side: with supervision PT Goal: Sit to Supine/Side - Progress: Progressing toward goal Pt will go Sit to Stand: with supervision PT Goal: Sit to Stand - Progress: Met Pt will go Stand to Sit: with supervision PT Goal: Stand to Sit - Progress: Met Pt will Ambulate: 16 - 50 feet PT Goal: Ambulate - Progress: Progressing toward goal Pt will Go Up / Down Stairs: 1-2 stairs;with rail(s) PT Goal: Up/Down Stairs - Progress: Met Pt will Perform Home Exercise Program: with supervision, verbal cues required/provided PT Goal: Perform Home Exercise Program - Progress: Progressing toward goal  Visit Information  Last PT Received On: 12/09/11 Assistance Needed: +1    Subjective Data  Subjective: i want to go   Cognition  Overall Cognitive Status: Appears within functional limits for tasks assessed/performed Arousal/Alertness: Awake/alert Orientation Level: Appears intact for tasks assessed Behavior During Session: [facility] for tasks performed    Balance     End of Session PT - End of Session Equipment Utilized During Treatment: Right knee immobilizer Activity Tolerance: Patient tolerated treatment well Patient left: in chair Nurse Communication: Mobility status    Rada Hay 12/09/2011, 1:14 PM

## 2011-12-09 NOTE — Progress Notes (Signed)
Pt discharged to [facility] via w/c. Assessment unchanged from am. 

## 2011-12-12 ENCOUNTER — Encounter: Payer: Self-pay | Admitting: Family Medicine

## 2011-12-12 ENCOUNTER — Ambulatory Visit (INDEPENDENT_AMBULATORY_CARE_PROVIDER_SITE_OTHER): Payer: Medicare Other | Admitting: Family Medicine

## 2011-12-12 ENCOUNTER — Telehealth: Payer: Self-pay | Admitting: Family Medicine

## 2011-12-12 VITALS — BP 130/64 | HR 74 | Temp 98.0°F | Ht 61.0 in

## 2011-12-12 DIAGNOSIS — R058 Other specified cough: Secondary | ICD-10-CM | POA: Insufficient documentation

## 2011-12-12 DIAGNOSIS — R05 Cough: Secondary | ICD-10-CM | POA: Insufficient documentation

## 2011-12-12 MED ORDER — AZITHROMYCIN 250 MG PO TABS: ORAL_TABLET | ORAL | Status: AC

## 2011-12-12 NOTE — Telephone Encounter (Signed)
Caller: Despina/Patient; PCP: Roxy Manns A.; CB#: [phone]; ; ; Call regarding Pt Just Had A. Knee Replacement A. Few Days Ago and Now Has Congestion. She Is Coughing Up Blood, Has Fluid in Her Right Ear and Dizziness.; knee surgery 12/05/11.  States has had productive cough and chest congestion.  R earache onset 12/12/11, with some dizziness noted afternoon 12/12/11.  Temp 99.4 O.  Per protocol, ED disposition.  Per Epic, appt slots unavailable; patient states has post op visit with ortho surgeon AM 12/13/11; states prefers to see Dr. Milinda Antis or have her call in an antibiotic.  TC to office to see if possible workin appt available.  Per office staff, patient to come to office now for workin with Dr. Milinda Antis.

## 2011-12-12 NOTE — Patient Instructions (Signed)
Drink lots of fluids  Use the incentive spirometer- take good deep breaths - every 30 minutes while awake  Take zithromax as directed - was sent to your pharmacy If short of breath or chest pain - go to ER asap  If worse - call office asap  See orthopedics tomorrow for follow up  Call us tomorrow to let me know how you are doing  Follow up in 2 weeks

## 2011-12-12 NOTE — Progress Notes (Signed)
Subjective:    Patient ID: Erin Beck, female    DOB: [birth year], 71 y.o.   MRN: [id number]  HPI Here for cough   Surgery was 4/17 - and is doing fairly well overall - the incision is doing well (R TKR) Dr Despina Hick   Woke up this am 7 -- head did not feel right - R ear ringing  (felt like fluid in it for a few days)  Started coughing - some green mucous -- one time had some dry blood in it  Mostly in am/ and pm  No chest pain or wheezing  Breathing is fine -- only used inhaler once since surgery   No pain in other leg or swelling or redness  Post op pain in R leg    Has appt with ortho tomorrow   xarelto- is her blood thinner - no problems with that so far   Patient Active Problem List  Diagnoses  . DIABETES MELLITUS, TYPE II  . HYPERLIPIDEMIA  . ANXIETY  . HYPERTENSION, BENIGN ESSENTIAL  . ALLERGIC RHINITIS  . OSTEOARTHRITIS  . DIVERTICULITIS, HX OF  . GERD  . TOBACCO USE, QUIT  . LLQ pain  . IBS (irritable bowel syndrome)  . Hematuria  . Obesity  . Other screening mammogram  . Preoperative examination  . Cough productive of purulent sputum   Past Medical History  Diagnosis Date  . Anxiety   . Diabetes mellitus     type II  . Diverticulitis     Hx of  . Hyperlipidemia   . Arthritis     OA  . Osteopenia   . GERD (gastroesophageal reflux disease)   . Hypertension   . Allergic rhinitis   . IBS (irritable bowel syndrome)   . Hematuria     HX OF MICROSCOPIC BLOOD IN URINE AND HX OF CYST ON ONE KIDNEY--BUT NO KIDNEY DISEASE   Past Surgical History  Procedure Date  . Abdominal hysterectomy   . Cholecystectomy   . Knee arthroscopy 1990    chondromalacia   . Eye surgery     BILATERAL CATARACTS REMOVED  . Joint replacement 2000    LEFT TOTAL KNEE ARTHROPLASTY   History  Substance Use Topics  . Smoking status: Former Smoker -- 1.0 packs/day for 48 years    Quit date: 08/20/2009  . Smokeless tobacco: Never Used  . Alcohol Use: No   Family  History  Problem Relation Age of Onset  . Diabetes Father   . Diabetes Brother    Allergies  Allergen Reactions  . Aleve Rash    Trouble breathing  . Naproxen Rash    Trouble breathing  . Aspirin     REACTION: burns stomach PT STATES SHE DOES TOLERATE THE 81 MG ASPIRIN DAILY  . Atorvastatin     REACTION: muscle pain  . Buspar (Buspirone Hcl)     Multiple side eff  . Metformin     REACTION: Diarrhea  . Simvastatin     REACTION: GI side eff and swelling  . Sulfa Antibiotics Other (See Comments)    Severe joint pain  . Sulfonamide Derivatives     REACTION: reaction not known   Current Outpatient Prescriptions on File Prior to Visit  Medication Sig Dispense Refill  . albuterol (PROAIR HFA) 108 (90 BASE) MCG/ACT inhaler Inhale 2 puffs into the lungs every 6 (six) hours as needed for wheezing.  1 Inhaler  11  . glipiZIDE (GLUCOTROL XL) 10 MG 24 hr tablet Take 10  mg by mouth every morning.      Marland Kitchen glucose blood test strip Check blood sugar once daily and as needed.      Marland Kitchen glucose monitoring kit (FREESTYLE) monitoring kit Check blood sugar once a day and as needed for diabetes 250.0       . hydrochlorothiazide (HYDRODIURIL) 25 MG tablet Take 25 mg by mouth every morning.      . Lancets (FREESTYLE) lancets Check blood sugar once daily and as needed.      Marland Kitchen lisinopril (PRINIVIL,ZESTRIL) 10 MG tablet Take 10 mg by mouth every morning.      . methocarbamol (ROBAXIN) 500 MG tablet Take 1 tablet (500 mg total) by mouth every 6 (six) hours as needed.  80 tablet  0  . niacin 500 MG tablet Take 500 mg by mouth Nightly.       Marland Kitchen omeprazole (PRILOSEC) 20 MG capsule Take 20 mg by mouth every morning.      Marland Kitchen oxyCODONE-acetaminophen (PERCOCET) 5-325 MG per tablet Take 1-2 tablets by mouth every 4 (four) hours as needed.  80 tablet  0  . rivaroxaban (XARELTO) 10 MG TABS tablet Take 1 tablet (10 mg total) by mouth daily. Take for two and a half more weeks, then discontinue Xarelto. After completing the  Xarelto, patient may resume the 81 mg aspirin.  17 tablet  0        Review of Systems Review of Systems  Constitutional: Negative for fever, appetite change, fatigue and unexpected weight change.  Eyes: Negative for pain and visual disturbance.  Respiratory: Negative for shortness of breath.  neg for PND or orthopnea pos for cough Cardiovascular: Negative for cp or palpitations   neg for rapid HR  Gastrointestinal: Negative for nausea, diarrhea and constipation.  Genitourinary: Negative for urgency and frequency.  Skin: Negative for pallor or rash   Neurological: Negative for weakness, light-headedness, numbness and headaches.  Hematological: Negative for adenopathy. Does not bruise/bleed easily.  Psychiatric/Behavioral: Negative for dysphoric mood. The patient is not nervous/anxious.          Objective:   Physical Exam  Constitutional: She appears well-developed and well-nourished. No distress.  HENT:  Head: Normocephalic and atraumatic.  Right Ear: External ear normal.  Left Ear: External ear normal.  Mouth/Throat: Oropharynx is clear and moist. No oropharyngeal exudate.       Nares are boggy but clear No sinus tenderness  Eyes: Conjunctivae and EOM are normal. Pupils are equal, round, and reactive to light. Right eye exhibits no discharge. Left eye exhibits no discharge. No scleral icterus.  Neck: Normal range of motion. Neck supple. No JVD present. Carotid bruit is not present. No tracheal deviation present. No thyromegaly present.  Cardiovascular: Normal rate, regular rhythm, normal heart sounds and intact distal pulses.  Exam reveals no gallop and no friction rub.   No murmur heard. Pulmonary/Chest: Effort normal and breath sounds normal. No respiratory distress. She has no wheezes. She has no rales. She exhibits no tenderness.  Abdominal: Soft. Bowel sounds are normal.  Musculoskeletal: She exhibits no edema and no tenderness.       R knee - incision with swelling -looks  good L leg - no swelling/ tenderness/ heat/ mass/ or palp cord Neg homans sign  Lymphadenopathy:    She has no cervical adenopathy.  Neurological: She is alert. She has normal reflexes. No cranial nerve deficit. She exhibits normal muscle tone. Coordination normal.  Skin: Skin is warm and dry. No rash noted. No  erythema. No pallor.  Psychiatric: She has a normal mood and affect.       Cheerful and talkative Says she feels very good           Assessment & Plan:

## 2011-12-12 NOTE — Telephone Encounter (Signed)
Patient seeing Dr. Tower today. 

## 2011-12-13 ENCOUNTER — Telehealth: Payer: Self-pay | Admitting: Family Medicine

## 2011-12-13 NOTE — Telephone Encounter (Signed)
Patient advised as instructed via telephone, she appreciated the call.

## 2011-12-13 NOTE — Telephone Encounter (Signed)
Patient saw Dr.Aplington today.  He said patient is doing great.  Continue what Dr.Tower told her to do for the chest congestion.  He changed patient's pain medication to Meperidine 50 mg.  She will see Dr.Aplington for a follow up appointment on 12/18/11.

## 2011-12-13 NOTE — Assessment & Plan Note (Signed)
1 week after knee replacement  Is on blood thinner to prevent DVT No s/s of PE - reviewed red flags carefully to watch for, however  Re assuring exam - will cover with zithromax empirically Is former smoker who often does cough up phlegm in ams Disc imp of IS use to prev atelectasis Cannot do cxr today due to pt's inability to stand - but will f/u up with one if needed when able to  She will call and update [place] tomorrow

## 2011-12-13 NOTE — Telephone Encounter (Signed)
Once again please remind her if any sob/ cough or other symptoms alert me- esp if she coughs up blood  And if sob - go to ER ( we disc s/s PE to watch for extensively in office) I'm glad she is doing well

## 2011-12-17 ENCOUNTER — Encounter (HOSPITAL_COMMUNITY): Payer: Self-pay | Admitting: Orthopedic Surgery

## 2011-12-26 ENCOUNTER — Encounter: Payer: Self-pay | Admitting: Family Medicine

## 2011-12-26 ENCOUNTER — Ambulatory Visit (INDEPENDENT_AMBULATORY_CARE_PROVIDER_SITE_OTHER): Payer: Medicare Other | Admitting: Family Medicine

## 2011-12-26 VITALS — BP 110/62 | HR 88 | Temp 98.9°F | Ht 61.0 in | Wt 182.8 lb

## 2011-12-26 DIAGNOSIS — I10 Essential (primary) hypertension: Secondary | ICD-10-CM

## 2011-12-26 DIAGNOSIS — S9000XA Contusion of unspecified ankle, initial encounter: Secondary | ICD-10-CM

## 2011-12-26 DIAGNOSIS — R058 Other specified cough: Secondary | ICD-10-CM

## 2011-12-26 DIAGNOSIS — R05 Cough: Secondary | ICD-10-CM

## 2011-12-26 DIAGNOSIS — R059 Cough, unspecified: Secondary | ICD-10-CM

## 2011-12-26 DIAGNOSIS — E119 Type 2 diabetes mellitus without complications: Secondary | ICD-10-CM

## 2011-12-26 DIAGNOSIS — D649 Anemia, unspecified: Secondary | ICD-10-CM

## 2011-12-26 NOTE — Assessment & Plan Note (Signed)
Medial R ankle (leg of surgery)- happened day after surgery Suspect this is dependent from the knee replacement Appears to be old and resolving with nl rom of ankle  She will have Dr Leslee Home check it at next f/u  Is ending her blood thinner today also and going back to asa 81 mg daily

## 2011-12-26 NOTE — Assessment & Plan Note (Signed)
Resolved after course of zithromax Rev cxr from mid April  Nl exam  Prior smoker  If cough returns or any hemoptysis will call asap

## 2011-12-26 NOTE — Assessment & Plan Note (Signed)
bp in fair control at this time  No changes needed  Disc lifstyle change with low sodium diet and exercise  F/u 3 mo   

## 2011-12-26 NOTE — Assessment & Plan Note (Signed)
Hb went down 2 pts to 10.7 after knee replacement  Re check today Will recommend iron if needed

## 2011-12-26 NOTE — Patient Instructions (Addendum)
If cough returns please let me know  Start back on aspirin 81 mg daily  Continue PT  Stay as active as you can  Keep watching sugars  Follow up with me in about 3 months  Labs today for post op anemia to see if you need iron

## 2011-12-26 NOTE — Progress Notes (Signed)
Subjective:    Patient ID: Erin Beck, female    DOB: Oct 10, 1940, 71 y.o.   MRN: [id number]  HPI Here for f/u of chronic conditions  Is doing great after knee replacement Got stitches out - has steri strips  Using walker  Going without brace quite a bit Ahead of the game per Dr Leslee Home  Doing her PT -- is difficult , getting her rom back  Is using muscles she has not used before  Pain is overall better than it was before the surgery!  Has bruising in her foot- posterior/medial- also the R side  Does not remember injuring it  Is doing better now    Last visit was here with prod cough after knee surgery and treated with zithromax Prior smoker That is totally better from that  Breathing is fine No longer coughing  No wheezing  Still a non smoker   Done with blood thinner Will go back to 81 mg asa  No pain pills today   (has to have one after PT, however)    bp is  110/62   Today No cp or palpitations or headaches or edema  No side effects to medicines   BP Readings from Last 3 Encounters:  12/26/11 110/62  12/12/11 130/64  12/09/11 113/71   Good control - ace  Diabetes Home sugar results - (went way up right after surgery)-now sugar is averaging in the 130s  DM diet - better - quit sodas , is sticking with diabetic diet  Exercise - starting to walk with walker  Symptoms-none  A1C last   Lab Results  Component Value Date   HGBA1C 7.2* 10/24/2011    No problems with medications  Renal protection ace Last eye exam 1/13- was ok   Cholesterol Lab Results  Component Value Date   CHOL 238* 10/24/2011   HDL 44.10 10/24/2011   LDLCALC  Value: 132        Total Cholesterol/HDL:CHD Risk Coronary Heart Disease Risk Table                     Beck   Women  1/2 Average Risk   3.4   3.3  Average Risk       5.0   4.4  2 X Average Risk   9.6   7.1  3 X Average Risk  23.4   11.0        Use the calculated Patient Ratio above and the CHD Risk Table to determine the patient's  CHD Risk.        ATP III CLASSIFICATION (LDL):  <100     mg/dL   Optimal  409-811  mg/dL   Near or Above                    Optimal  130-159  mg/dL   Borderline  914-782  mg/dL   High  >956     mg/dL   Very High* 10/03/863   LDLDIRECT 156.2 10/24/2011   TRIG 274.0* 10/24/2011   CHOLHDL 5 10/24/2011   intol several statins     Patient Active Problem List  Diagnoses  . DIABETES MELLITUS, TYPE II  . HYPERLIPIDEMIA  . ANXIETY  . HYPERTENSION, BENIGN ESSENTIAL  . ALLERGIC RHINITIS  . OSTEOARTHRITIS  . DIVERTICULITIS, HX OF  . GERD  . TOBACCO USE, QUIT  . LLQ pain  . IBS (irritable bowel syndrome)  . Hematuria  . Obesity  . Other screening mammogram  .  Preoperative examination  . Cough productive of purulent sputum  . Ankle bruise  . Postoperative anemia   Past Medical History  Diagnosis Date  . Anxiety   . Diabetes mellitus     type II  . Diverticulitis     Hx of  . Hyperlipidemia   . Arthritis     OA  . Osteopenia   . GERD (gastroesophageal reflux disease)   . Hypertension   . Allergic rhinitis   . IBS (irritable bowel syndrome)   . Hematuria     HX OF MICROSCOPIC BLOOD IN URINE AND HX OF CYST ON ONE KIDNEY--BUT NO KIDNEY DISEASE   Past Surgical History  Procedure Date  . Abdominal hysterectomy   . Cholecystectomy   . Knee arthroscopy 1990    chondromalacia   . Eye surgery     BILATERAL CATARACTS REMOVED  . Joint replacement 2000    LEFT TOTAL KNEE ARTHROPLASTY  . Total knee arthroplasty 12/05/2011    Procedure: TOTAL KNEE ARTHROPLASTY;  Surgeon: Drucilla Schmidt, MD;  Location: [facility];  Service: Orthopedics;  Laterality: Right;   History  Substance Use Topics  . Smoking status: Former Smoker -- 1.0 packs/day for 48 years    Quit date: 08/20/2009  . Smokeless tobacco: Never Used  . Alcohol Use: No   Family History  Problem Relation Age of Onset  . Diabetes Father   . Diabetes Brother    Allergies  Allergen Reactions  . Naproxen Rash    Trouble breathing   . Naproxen Sodium Rash    Trouble breathing  . Aspirin     REACTION: burns stomach PT STATES SHE DOES TOLERATE THE 81 MG ASPIRIN DAILY  . Atorvastatin     REACTION: muscle pain  . Buspar (Buspirone Hcl)     Multiple side eff  . Metformin     REACTION: Diarrhea  . Simvastatin     REACTION: GI side eff and swelling  . Sulfa Antibiotics Other (See Comments)    Severe joint pain  . Sulfonamide Derivatives     REACTION: reaction not known   Current Outpatient Prescriptions on File Prior to Visit  Medication Sig Dispense Refill  . albuterol (PROAIR HFA) 108 (90 BASE) MCG/ACT inhaler Inhale 2 puffs into the lungs every 6 (six) hours as needed for wheezing.  1 Inhaler  11  . glipiZIDE (GLUCOTROL XL) 10 MG 24 hr tablet Take 10 mg by mouth every morning.      Marland Kitchen glucose blood test strip Check blood sugar once daily and as needed.      Marland Kitchen glucose monitoring kit (FREESTYLE) monitoring kit Check blood sugar once a day and as needed for diabetes 250.0       . hydrochlorothiazide (HYDRODIURIL) 25 MG tablet Take 25 mg by mouth every morning.      . Lancets (FREESTYLE) lancets Check blood sugar once daily and as needed.      Marland Kitchen lisinopril (PRINIVIL,ZESTRIL) 10 MG tablet Take 10 mg by mouth every morning.      . niacin 500 MG tablet Take 500 mg by mouth Nightly.       Marland Kitchen omeprazole (PRILOSEC) 20 MG capsule Take 20 mg by mouth every morning.          Review of Systems Review of Systems  Constitutional: Negative for fever, appetite change, fatigue and unexpected weight change.  Eyes: Negative for pain and visual disturbance.  Respiratory: Negative for cough and shortness of breath.   Cardiovascular: Negative  for cp or palpitations    Gastrointestinal: Negative for nausea, diarrhea and constipation.  Genitourinary: Negative for urgency and frequency.  Skin: Negative for pallor or rash   MSK pos for knee pain that is improving  Neurological: Negative for weakness, light-headedness, numbness and  headaches.  Hematological: Negative for adenopathy. Does not bruise/bleed easily.  Psychiatric/Behavioral: Negative for dysphoric mood. The patient is not nervous/anxious.         Objective:   Physical Exam  Constitutional: She appears well-developed and well-nourished. No distress.       overwt and well appearing   HENT:  Head: Normocephalic and atraumatic.  Mouth/Throat: Oropharynx is clear and moist.  Eyes: Conjunctivae and EOM are normal. Pupils are equal, round, and reactive to light. No scleral icterus.  Neck: Normal range of motion. Neck supple. No JVD present. Carotid bruit is not present. No thyromegaly present.  Cardiovascular: Normal rate, regular rhythm, normal heart sounds and intact distal pulses.  Exam reveals no gallop.   Pulmonary/Chest: Effort normal and breath sounds normal. No respiratory distress. She has no wheezes. She has no rales. She exhibits no tenderness.       Diffusely distant bs   Abdominal: Soft. Bowel sounds are normal. She exhibits no distension, no abdominal bruit and no mass. There is no tenderness.  Musculoskeletal: She exhibits edema.       Scar on R knee is healing well  Old bruising noted inner R ankle -non tender  Lymphadenopathy:    She has no cervical adenopathy.  Neurological: She is alert. She has normal reflexes. No cranial nerve deficit. She exhibits normal muscle tone. Coordination normal.  Skin: Skin is warm and dry. No rash noted. No erythema. No pallor.  Psychiatric: She has a normal mood and affect.          Assessment & Plan:

## 2011-12-26 NOTE — Assessment & Plan Note (Signed)
Sugar did go up post op in mid April and now normalizing Per pt avg 130s  Diet good/ no more sodas, appetitie returning and wt down 3 lb On ace opthy utd Nl foot exam F/u 3 mo

## 2011-12-27 LAB — CBC WITH DIFFERENTIAL/PLATELET
Basophils Relative: 0.5 % (ref 0.0–3.0)
Eosinophils Absolute: 0.2 10*3/uL (ref 0.0–0.7)
Hemoglobin: 12.5 g/dL (ref 12.0–15.0)
MCHC: 33.3 g/dL (ref 30.0–36.0)
MCV: 92.7 fl (ref 78.0–100.0)
Monocytes Absolute: 0.5 10*3/uL (ref 0.1–1.0)
Neutro Abs: 5.4 10*3/uL (ref 1.4–7.7)
Neutrophils Relative %: 70.6 % (ref 43.0–77.0)
RBC: 4.05 Mil/uL (ref 3.87–5.11)

## 2011-12-31 NOTE — Discharge Summary (Signed)
Physician Discharge Summary   Patient ID: Erin Beck MRN: [id number] DOB/AGE: 26-May-1941 71 y.o.  Admit date: 12/05/2011 Discharge date: 12/09/2011  Primary Diagnosis: End-stage osteoarthritis, right knee.  Admission Diagnoses:  Past Medical History  Diagnosis Date  . Anxiety   . Diabetes mellitus     type II  . Diverticulitis     Hx of  . Hyperlipidemia   . Arthritis     OA  . Osteopenia   . GERD (gastroesophageal reflux disease)   . Hypertension   . Allergic rhinitis   . IBS (irritable bowel syndrome)   . Hematuria     HX OF MICROSCOPIC BLOOD IN URINE AND HX OF CYST ON ONE KIDNEY--BUT NO KIDNEY DISEASE   Discharge Diagnoses:   Active Problems:  * No active hospital problems. *   Procedure:  Procedure(s) (LRB): TOTAL KNEE ARTHROPLASTY (Right)   Consults: None  HPI: She had a successful total knee replacement on the left 13 years ago on the right knee. She had bone-on-bone abutment medially and significant disabling pain. She had such a good result from her left knee that she would like to try and have her right knee performed as well.  Laboratory Data: Hospital Outpatient Visit on 11/26/2011  Component Date Value Range Status  . MRSA, PCR  11/26/2011 NEGATIVE  NEGATIVE Final  . Staphylococcus aureus  11/26/2011 NEGATIVE  NEGATIVE Final   Comment:                                 The Xpert SA Assay (FDA                          approved for NASAL specimens                          only), is one component of                          a comprehensive surveillance                          program.  It is not intended                          to diagnose infection nor to                          guide or monitor treatment.  . WBC (K/uL) 11/26/2011 7.8  4.0-10.5 Final  . RBC (MIL/uL) 11/26/2011 4.84  3.87-5.11 Final  . Hemoglobin (g/dL) 62/13/0865 78.4  69.6-29.5 Final  . HCT (%) 11/26/2011 44.4  36.0-46.0 Final  . MCV (fL) 11/26/2011 91.7  78.0-100.0 Final  .  MCH (pg) 11/26/2011 30.8  26.0-34.0 Final  . MCHC (g/dL) 28/41/3244 01.0  27.2-53.6 Final  . RDW (%) 11/26/2011 12.6  11.5-15.5 Final  . Platelets (K/uL) 11/26/2011 270  150-400 Final  . Sodium (mEq/L) 11/26/2011 140  135-145 Final  . Potassium (mEq/L) 11/26/2011 3.9  3.5-5.1 Final  . Chloride (mEq/L) 11/26/2011 100  96-112 Final  . CO2 (mEq/L) 11/26/2011 31  19-32 Final  . Glucose, Bld (mg/dL) 64/40/3474 259* 56-38 Final  . BUN (mg/dL) 75/64/3329 18  5-18 Final  . Creatinine, Ser (mg/dL) 84/16/6063 0.16  0.50-1.10 Final  . Calcium (mg/dL) 96/11/5407 9.8  8.1-19.1 Final  . GFR calc non Af Amer (mL/min) 11/26/2011 87* >90 Final  . GFR calc Af Amer (mL/min) 11/26/2011 >90  >90 Final   Comment:                                 The eGFR has been calculated                          using the CKD EPI equation.                          This calculation has not been                          validated in all clinical                          situations.                          eGFR's persistently                          <90 mL/min signify                          possible Chronic Kidney Disease.  Marland Kitchen Prothrombin Time (seconds) 11/26/2011 12.0  11.6-15.2 Final  . INR  11/26/2011 0.87  0.00-1.49 Final  . aPTT (seconds) 11/26/2011 30  24-37 Final   No results found for this basename: HGB:5 in the last 72 hours No results found for this basename: WBC:2,RBC:2,HCT:2,PLT:2 in the last 72 hours No results found for this basename: NA:2,K:2,CL:2,CO2:2,BUN:2,CREATININE:2,GLUCOSE:2,CALCIUM:2 in the last 72 hours No results found for this basename: LABPT:2,INR:2 in the last 72 hours  X-Rays:Dg Knee Right Port  12/05/2011  *RADIOLOGY REPORT*  Clinical Data: Postop right total knee replacement  PORTABLE RIGHT KNEE - 1-2 VIEW  Comparison: None.  Findings: A right total knee prosthesis is now in place and anatomically aligned.  There is no peri hardware lucency or fracture.  Postsurgical staples and drains are  in place.  IMPRESSION: New right total knee prosthesis with no evidence of fracture or hardware complication.  Original Report Authenticated By: Brandon Melnick, M.D.    EKG: Orders placed in visit on 11/19/11  . EKG 12-LEAD  . EKG 12-LEAD     Hospital Course: Patient was admitted to [facility] and taken to the OR and underwent the above state procedure without complications.  Patient tolerated the procedure well and was later transferred to the recovery room and then to the orthopaedic floor for postoperative care.  They were given PO and IV analgesics for pain control following their surgery.  They were given 24 hours of postoperative antibiotics and started on DVT prophylaxis in the form of Xarelto.   PT and OT were ordered for total joint protocol.  Discharge planning consulted to help with postop disposition and equipment needs.  Patient had a tough night on the evening of surgery and was sick on day one but started to get up OOB with therapy on day one. Hemovac drain was pulled without difficulty.  Continued to work with therapy into  day two.  Dressing was changed on day two and the incision was healing nicely.  By day three, the patient had not progressed with therapy and needed one more day.  By day 4, the incision was healing well.  Patient was seen in rounds and was ready to go home.  Discharge Medications: Prior to Admission medications   Medication Sig Start Date End Date Taking? Authorizing Provider  albuterol (PROAIR HFA) 108 (90 BASE) MCG/ACT inhaler Inhale 2 puffs into the lungs every 6 (six) hours as needed for wheezing. 01/26/11 01/26/12 Yes Marne A Tower, MD  glipiZIDE (GLUCOTROL XL) 10 MG 24 hr tablet Take 10 mg by mouth every morning. 11/19/11 11/18/12 Yes Marne A Tower, MD  glucose blood test strip Check blood sugar once daily and as needed.   Yes Historical Provider, MD  glucose monitoring kit (FREESTYLE) monitoring kit Check blood sugar once a day and as needed for diabetes  250.0    Yes Historical Provider, MD  hydrochlorothiazide (HYDRODIURIL) 25 MG tablet Take 25 mg by mouth every morning. 10/31/11  Yes Judy Pimple, MD  Lancets (FREESTYLE) lancets Check blood sugar once daily and as needed.   Yes Historical Provider, MD  lisinopril (PRINIVIL,ZESTRIL) 10 MG tablet Take 10 mg by mouth every morning. 10/31/11  Yes Judy Pimple, MD  niacin 500 MG tablet Take 500 mg by mouth Nightly.    Yes Historical Provider, MD  omeprazole (PRILOSEC) 20 MG capsule Take 20 mg by mouth every morning. 10/31/11  Yes Judy Pimple, MD  aspirin 81 MG tablet Take 81 mg by mouth daily.    Historical Provider, MD    Diet: heart healthy Activity:WBAT Follow-up:in 2 weeks Disposition - Home Discharged Condition: good   Discharge Orders    Future Appointments: Provider: Department: Dept Phone: Center:   03/28/2012 10:15 AM Judy Pimple, MD [facility] [phone] [id number]     Future Orders Please Complete By Expires   Diet - low sodium heart healthy      Call MD / Call 911      Comments:   If you experience chest pain or shortness of breath, CALL 911 and be transported to the hospital emergency room.  If you develope a fever above 101 F, pus (white drainage) or increased drainage or redness at the wound, or calf pain, call your surgeon's office.   Constipation Prevention      Comments:   Drink plenty of fluids.  Prune juice may be helpful.  You may use a stool softener, such as Colace (over the counter) 100 mg twice a day.  Use MiraLax (over the counter) for constipation as needed.   Increase activity slowly as tolerated      Weight Bearing as taught in Physical Therapy      Comments:   Use a walker or crutches as instructed.   Discharge instructions      Comments:   Pick up stool softner and laxative for home. Do not submerge incision under water. May shower. Continue to use ice for pain and swelling from surgery.  Take Xarelto for two and a half more weeks, then  discontinue Xarelto. Resume 81 mg Aspirin after completing the Xarelto.  .   Driving restrictions      Comments:   No driving   Lifting restrictions      Comments:   No lifting   TED hose      Comments:   Use stockings (TED hose) for 3 weeks  on both leg(s).  You may remove them at night for sleeping.   Change dressing      Comments:   Change dressing daily with sterile 4 x 4 inch gauze dressing and apply TED hose.   Do not put a pillow under the knee. Place it under the heel.        Medication List  As of 12/31/2011  8:02 AM   STOP taking these medications         aspirin 81 MG chewable tablet      Calcium Carbonate-Vit D-Min 600-400 MG-UNIT Tabs      FISH OIL PO      Flax Oil      VITAMIN A PO         TAKE these medications         albuterol 108 (90 BASE) MCG/ACT inhaler   Commonly known as: PROVENTIL HFA;VENTOLIN HFA   Inhale 2 puffs into the lungs every 6 (six) hours as needed for wheezing.      freestyle lancets   Check blood sugar once daily and as needed.      glipiZIDE 10 MG 24 hr tablet   Commonly known as: GLUCOTROL XL   Take 10 mg by mouth every morning.      glucose blood test strip   Check blood sugar once daily and as needed.      glucose monitoring kit monitoring kit   Check blood sugar once a day and as needed for diabetes 250.0      hydrochlorothiazide 25 MG tablet   Commonly known as: HYDRODIURIL   Take 25 mg by mouth every morning.      lisinopril 10 MG tablet   Commonly known as: PRINIVIL,ZESTRIL   Take 10 mg by mouth every morning.      niacin 500 MG tablet   Take 500 mg by mouth Nightly.      omeprazole 20 MG capsule   Commonly known as: PRILOSEC   Take 20 mg by mouth every morning.           Follow-up Information    Follow up with APLINGTON,JAMES P, MD. Schedule an appointment as soon as possible for a visit in 2 weeks.   Contact information:   [facility] [address], [address] [place]  [place] [address] [phone]          Signed: Patrica Duel 12/31/2011, 8:02 AM

## 2012-01-15 ENCOUNTER — Other Ambulatory Visit: Payer: Self-pay | Admitting: Family Medicine

## 2012-02-06 ENCOUNTER — Ambulatory Visit: Payer: Medicare Other | Admitting: Family Medicine

## 2012-02-28 ENCOUNTER — Ambulatory Visit (INDEPENDENT_AMBULATORY_CARE_PROVIDER_SITE_OTHER): Payer: Medicare Other | Admitting: Family Medicine

## 2012-02-28 ENCOUNTER — Encounter: Payer: Self-pay | Admitting: Family Medicine

## 2012-02-28 VITALS — BP 130/70 | HR 100 | Temp 97.6°F | Wt 185.0 lb

## 2012-02-28 DIAGNOSIS — R300 Dysuria: Secondary | ICD-10-CM

## 2012-02-28 LAB — POCT URINALYSIS DIPSTICK
Protein, UA: NEGATIVE
Spec Grav, UA: 1.01
Urobilinogen, UA: NEGATIVE

## 2012-02-28 MED ORDER — CIPROFLOXACIN HCL 500 MG PO TABS: 500.0000 mg | ORAL_TABLET | Freq: Two times a day (BID) | ORAL | Status: AC

## 2012-02-28 NOTE — Progress Notes (Signed)
SUBJECTIVE: Erin Beck is a 71 y.o. female who complains of urinary frequency, urgency and dysuria x 3 days, without  fever, chills, or abnormal vaginal discharge or bleeding.  She has been having some flank pain and nausea since yesterday.  Patient Active Problem List  Diagnosis  . DIABETES MELLITUS, TYPE II  . HYPERLIPIDEMIA  . ANXIETY  . HYPERTENSION, BENIGN ESSENTIAL  . ALLERGIC RHINITIS  . OSTEOARTHRITIS  . DIVERTICULITIS, HX OF  . GERD  . TOBACCO USE, QUIT  . LLQ pain  . IBS (irritable bowel syndrome)  . Hematuria  . Obesity  . Other screening mammogram  . Preoperative examination  . Cough productive of purulent sputum  . Ankle bruise  . Postoperative anemia   Past Medical History  Diagnosis Date  . Anxiety   . Diabetes mellitus     type II  . Diverticulitis     Hx of  . Hyperlipidemia   . Arthritis     OA  . Osteopenia   . GERD (gastroesophageal reflux disease)   . Hypertension   . Allergic rhinitis   . IBS (irritable bowel syndrome)   . Hematuria     HX OF MICROSCOPIC BLOOD IN URINE AND HX OF CYST ON ONE KIDNEY--BUT NO KIDNEY DISEASE   Past Surgical History  Procedure Date  . Abdominal hysterectomy   . Cholecystectomy   . Knee arthroscopy 1990    chondromalacia   . Eye surgery     BILATERAL CATARACTS REMOVED  . Joint replacement 2000    LEFT TOTAL KNEE ARTHROPLASTY  . Total knee arthroplasty 12/05/2011    Procedure: TOTAL KNEE ARTHROPLASTY;  Surgeon: Drucilla Schmidt, MD;  Location: [facility];  Service: Orthopedics;  Laterality: Right;   History  Substance Use Topics  . Smoking status: Former Smoker -- 1.0 packs/day for 48 years    Quit date: 08/20/2009  . Smokeless tobacco: Never Used  . Alcohol Use: No   Family History  Problem Relation Age of Onset  . Diabetes Father   . Diabetes Brother    Allergies  Allergen Reactions  . Naproxen Rash    Trouble breathing  . Naproxen Sodium Rash    Trouble breathing  . Aspirin     REACTION:  burns stomach PT STATES SHE DOES TOLERATE THE 81 MG ASPIRIN DAILY  . Atorvastatin     REACTION: muscle pain  . Buspar (Buspirone Hcl)     Multiple side eff  . Metformin     REACTION: Diarrhea  . Simvastatin     REACTION: GI side eff and swelling  . Sulfa Antibiotics Other (See Comments)    Severe joint pain  . Sulfonamide Derivatives     REACTION: reaction not known   Current Outpatient Prescriptions on File Prior to Visit  Medication Sig Dispense Refill  . aspirin 81 MG tablet Take 81 mg by mouth daily.      Marland Kitchen glipiZIDE (GLUCOTROL XL) 10 MG 24 hr tablet Take 10 mg by mouth every morning.      Marland Kitchen glucose blood test strip Check blood sugar once daily and as needed.      Marland Kitchen glucose monitoring kit (FREESTYLE) monitoring kit Check blood sugar once a day and as needed for diabetes 250.0       . hydrochlorothiazide (HYDRODIURIL) 25 MG tablet TAKE 1 TABLET BY MOUTH DAILY  90 tablet  3  . Lancets (FREESTYLE) lancets Check blood sugar once daily and as needed.      Marland Kitchen  lisinopril (PRINIVIL,ZESTRIL) 10 MG tablet TAKE 1 TABLET (10 MG TOTAL) BY MOUTH DAILY.  90 tablet  3  . niacin 500 MG tablet Take 500 mg by mouth Nightly.       Marland Kitchen omeprazole (PRILOSEC) 20 MG capsule Take 20 mg by mouth every morning.      Marland Kitchen DISCONTD: albuterol (PROAIR HFA) 108 (90 BASE) MCG/ACT inhaler Inhale 2 puffs into the lungs every 6 (six) hours as needed for wheezing.  1 Inhaler  11  . DISCONTD: hydrochlorothiazide (HYDRODIURIL) 25 MG tablet Take 25 mg by mouth every morning.      Marland Kitchen DISCONTD: lisinopril (PRINIVIL,ZESTRIL) 10 MG tablet Take 10 mg by mouth every morning.       The PMH, PSH, Social History, Family History, Medications, and allergies have been reviewed in [facility], and have been updated if relevant.  OBJECTIVE:  BP 130/70  Pulse 100  Temp 97.6 F (36.4 C)  Wt 185 lb (83.915 kg)  Appears well, in no apparent distress.  Vital signs are normal. The abdomen is soft without tenderness, guarding, mass, rebound or  organomegaly. No CVA tenderness or inguinal adenopathy noted. Urine dipstick shows positive for RBC's.    ASSESSMENT: UTI complicated without evidence of pyelonephritis  PLAN: Treatment per orders cipro 500 mg twice daily x 10 days - also push fluids, may use Pyridium OTC prn. Call or return to clinic prn if these symptoms worsen or fail to improve as anticipated.

## 2012-02-28 NOTE — Patient Instructions (Addendum)
Good to see you. We are treating you for a UTI with cipro. Please take as directed. We are sending your urine off for a culture- we will call you with those results.

## 2012-03-04 ENCOUNTER — Telehealth: Payer: Self-pay

## 2012-03-04 NOTE — Telephone Encounter (Signed)
Please finish cipro.  Urine cx showed that bacteria is sensitive to cipro so hopefully she will continue to feel better. Keep [place] posted.

## 2012-03-04 NOTE — Telephone Encounter (Signed)
Pt started burning upon urination again last night; pt taking Cipro and should finish med on Sat. Lower back pain on both sides but pt has disc problem. [facility].Please advise.

## 2012-03-04 NOTE — Telephone Encounter (Signed)
Advised patient, she will let us know if problem continues after she finishes the cipro.

## 2012-03-14 ENCOUNTER — Encounter: Payer: Self-pay | Admitting: Family Medicine

## 2012-03-14 ENCOUNTER — Ambulatory Visit (INDEPENDENT_AMBULATORY_CARE_PROVIDER_SITE_OTHER): Payer: Medicare Other | Admitting: Family Medicine

## 2012-03-14 VITALS — BP 132/78 | HR 96 | Temp 98.4°F | Wt 185.8 lb

## 2012-03-14 DIAGNOSIS — R300 Dysuria: Secondary | ICD-10-CM

## 2012-03-14 LAB — POCT URINALYSIS DIPSTICK
Glucose, UA: NEGATIVE
Ketones, UA: NEGATIVE
Spec Grav, UA: <=1.005

## 2012-03-14 MED ORDER — CIPROFLOXACIN HCL 500 MG PO TABS: 500.0000 mg | ORAL_TABLET | Freq: Two times a day (BID) | ORAL | Status: AC

## 2012-03-14 NOTE — Progress Notes (Signed)
Prev with UTI, klebsiella sens to cipro. Treated and was feeling better until 2 days ago when abd pain, dysuria, low back pain returned.  Also with some R ear pain.  Tmax 99.8. No vomiting, no diarrhea.  Some nausea.  Tolerated cipro prev.   Meds, vitals, and allergies reviewed.   ROS: See HPI.  Otherwise negative.    GEN: nad, alert and oriented HEENT: mucous membranes moist, L TM wnl, R TM partially obscured with cerumen. Neither canal or pinna irritated.  NECK: supple CV: rrr.  PULM: ctab, no inc wob ABD: soft, +bs, suprapubic area mildly tender EXT: no edema SKIN: no acute rash BACK: no CVA pain R knee with healing scar from arthroplasty

## 2012-03-14 NOTE — Patient Instructions (Addendum)
Drink plenty of water and start the antibiotics today.  We'll contact you with your lab report.  Take care.   

## 2012-03-14 NOTE — Assessment & Plan Note (Signed)
Restart cipro, check ucx and drink plenty of fluids.  Fu prn.  Nontoxic.  Exam not c/w pyelo.  She can likely rinse out her R canal warm water in the shower.

## 2012-03-16 LAB — URINE CULTURE: Colony Count: 25000

## 2012-03-28 ENCOUNTER — Ambulatory Visit (INDEPENDENT_AMBULATORY_CARE_PROVIDER_SITE_OTHER): Payer: Medicare Other | Admitting: Family Medicine

## 2012-03-28 ENCOUNTER — Encounter: Payer: Self-pay | Admitting: Family Medicine

## 2012-03-28 VITALS — BP 116/64 | HR 80 | Temp 97.7°F | Ht 61.0 in | Wt 184.0 lb

## 2012-03-28 DIAGNOSIS — E119 Type 2 diabetes mellitus without complications: Secondary | ICD-10-CM

## 2012-03-28 DIAGNOSIS — I10 Essential (primary) hypertension: Secondary | ICD-10-CM

## 2012-03-28 DIAGNOSIS — K219 Gastro-esophageal reflux disease without esophagitis: Secondary | ICD-10-CM

## 2012-03-28 LAB — COMPREHENSIVE METABOLIC PANEL
Alkaline Phosphatase: 66 U/L (ref 39–117)
Glucose, Bld: 145 mg/dL — ABNORMAL HIGH (ref 70–99)
Sodium: 136 mEq/L (ref 135–145)
Total Bilirubin: 0.4 mg/dL (ref 0.3–1.2)
Total Protein: 7.4 g/dL (ref 6.0–8.3)

## 2012-03-28 LAB — HEMOGLOBIN A1C: Hgb A1c MFr Bld: 7.4 % — ABNORMAL HIGH (ref 4.6–6.5)

## 2012-03-28 MED ORDER — ALBUTEROL SULFATE HFA 108 (90 BASE) MCG/ACT IN AERS: 2.0000 | INHALATION_SPRAY | Freq: Four times a day (QID) | RESPIRATORY_TRACT | Status: DC | PRN

## 2012-03-28 NOTE — Assessment & Plan Note (Signed)
bp in fair control at this time  No changes needed  Disc lifstyle change with low sodium diet and exercise  Labs today F/u 6 mo  

## 2012-03-28 NOTE — Patient Instructions (Addendum)
Stay off of the prilosec - glad you no longer need it  Watch diet - stick to diabetic diet and work on weight loss  Labs today  Gradually increase exercise with permission from orthopedics  Follow up in 6 months for annual exam with labs prior

## 2012-03-28 NOTE — Assessment & Plan Note (Signed)
PP sugar still high  Also watching for lows Overall is improved  Lab today for a1c  F/u 6 mo - earlier if needed Will update if low sugars

## 2012-03-28 NOTE — Assessment & Plan Note (Signed)
Off prilosec Is fine as long as she eats right  That is good news

## 2012-03-28 NOTE — Progress Notes (Signed)
Subjective:    Patient ID: Erin Beck, female    DOB: November 10, 1940, 71 y.o.   MRN: [id number]  HPI  Here for f/u of chronic conditions Doing a lot better lately   Her knee is much better -healed and walking well   Just got over some abx for uti  Stopped her prilosec and no reflux for the most part - just feels better off of it (as long as she eats right)  Eyesight is even better   bp is  ok   Today No cp or palpitations or headaches or edema  No side effects to medicines   BP Readings from Last 3 Encounters:  03/28/12 116/64  03/14/12 132/78  02/28/12 130/70    Diabetes Home sugar results - are better lately - low 100s for am and 180s after dinner (2 hours) - lowest in 70s (in the afternoons) DM diet - less hungry- eating less, but diet not the best - getting better now that she is doing cooking  Exercise - now finally getting better  Symptoms A1C last   Lab Results  Component Value Date   HGBA1C 7.2* 10/24/2011    No problems with medications  Renal protection-on ace  Last eye exam -- April with Dr Luciana Axe    Lipids Lab Results  Component Value Date   CHOL 238* 10/24/2011   HDL 44.10 10/24/2011   LDLCALC  Value: 132        Total Cholesterol/HDL:CHD Risk Coronary Heart Disease Risk Table                     Beck   Women  1/2 Average Risk   3.4   3.3  Average Risk       5.0   4.4  2 X Average Risk   9.6   7.1  3 X Average Risk  23.4   11.0        Use the calculated Patient Ratio above and the CHD Risk Table to determine the patient's CHD Risk.        ATP III CLASSIFICATION (LDL):  <100     mg/dL   Optimal  284-132  mg/dL   Near or Above                    Optimal  130-159  mg/dL   Borderline  440-102  mg/dL   High  >725     mg/dL   Very High* 3/66/4403   LDLDIRECT 156.2 10/24/2011   TRIG 274.0* 10/24/2011   CHOLHDL 5 10/24/2011   intol of statins  Diet controlled  Wt is stable  Patient Active Problem List  Diagnosis  . DIABETES MELLITUS, TYPE II  . HYPERLIPIDEMIA  .  ANXIETY  . HYPERTENSION, BENIGN ESSENTIAL  . ALLERGIC RHINITIS  . OSTEOARTHRITIS  . DIVERTICULITIS, HX OF  . GERD  . TOBACCO USE, QUIT  . LLQ pain  . IBS (irritable bowel syndrome)  . Hematuria  . Obesity  . Other screening mammogram  . Preoperative examination  . Cough productive of purulent sputum  . Ankle bruise  . Postoperative anemia  . Dysuria   Past Medical History  Diagnosis Date  . Anxiety   . Diabetes mellitus     type II  . Diverticulitis     Hx of  . Hyperlipidemia   . Arthritis     OA  . Osteopenia   . GERD (gastroesophageal reflux disease)   . Hypertension   .  Allergic rhinitis   . IBS (irritable bowel syndrome)   . Hematuria     HX OF MICROSCOPIC BLOOD IN URINE AND HX OF CYST ON ONE KIDNEY--BUT NO KIDNEY DISEASE   Past Surgical History  Procedure Date  . Abdominal hysterectomy   . Cholecystectomy   . Knee arthroscopy 1990    chondromalacia   . Eye surgery     BILATERAL CATARACTS REMOVED  . Joint replacement 2000    LEFT TOTAL KNEE ARTHROPLASTY  . Total knee arthroplasty 12/05/2011    Procedure: TOTAL KNEE ARTHROPLASTY;  Surgeon: Drucilla Schmidt, MD;  Location: [facility];  Service: Orthopedics;  Laterality: Right;   History  Substance Use Topics  . Smoking status: Former Smoker -- 1.0 packs/day for 48 years    Quit date: 08/20/2009  . Smokeless tobacco: Never Used  . Alcohol Use: No   Family History  Problem Relation Age of Onset  . Diabetes Father   . Diabetes Brother    Allergies  Allergen Reactions  . Naproxen Rash    Trouble breathing  . Naproxen Sodium Rash    Trouble breathing  . Aspirin     REACTION: burns stomach PT STATES SHE DOES TOLERATE THE 81 MG ASPIRIN DAILY  . Atorvastatin     REACTION: muscle pain  . Buspar (Buspirone Hcl)     Multiple side eff  . Metformin     REACTION: Diarrhea  . Simvastatin     REACTION: GI side eff and swelling  . Sulfa Antibiotics Other (See Comments)    Severe joint pain  . Sulfonamide  Derivatives     REACTION: reaction not known   Current Outpatient Prescriptions on File Prior to Visit  Medication Sig Dispense Refill  . albuterol (PROVENTIL HFA;VENTOLIN HFA) 108 (90 BASE) MCG/ACT inhaler Inhale 2 puffs into the lungs every 6 (six) hours as needed.  1 Inhaler  11  . aspirin 81 MG tablet Take 81 mg by mouth daily.      Marland Kitchen glipiZIDE (GLUCOTROL XL) 10 MG 24 hr tablet Take 10 mg by mouth every morning.      Marland Kitchen glucose blood test strip Check blood sugar once daily and as needed.      Marland Kitchen glucose monitoring kit (FREESTYLE) monitoring kit Check blood sugar once a day and as needed for diabetes 250.0       . hydrochlorothiazide (HYDRODIURIL) 25 MG tablet TAKE 1 TABLET BY MOUTH DAILY  90 tablet  3  . Lancets (FREESTYLE) lancets Check blood sugar once daily and as needed.      Marland Kitchen lisinopril (PRINIVIL,ZESTRIL) 10 MG tablet TAKE 1 TABLET (10 MG TOTAL) BY MOUTH DAILY.  90 tablet  3  . niacin 500 MG tablet Take 500 mg by mouth Nightly.          Review of Systems Review of Systems  Constitutional: Negative for fever, appetite change, fatigue and unexpected weight change.  Eyes: Negative for pain and visual disturbance.  Respiratory: Negative for cough and shortness of breath.   Cardiovascular: Negative for cp or palpitations    Gastrointestinal: Negative for nausea, diarrhea and constipation.  Genitourinary: Negative for urgency and frequency.  Skin: Negative for pallor or rash   Neurological: Negative for weakness, light-headedness, numbness and headaches.  Hematological: Negative for adenopathy. Does not bruise/bleed easily.  Psychiatric/Behavioral: Negative for dysphoric mood. The patient is not nervous/anxious.         Objective:   Physical Exam  Constitutional: She appears well-developed and well-nourished.  No distress.       Obese and well appearing   HENT:  Head: Normocephalic and atraumatic.  Mouth/Throat: Oropharynx is clear and moist.  Eyes: Conjunctivae and EOM are  normal. Pupils are equal, round, and reactive to light. No scleral icterus.  Neck: Normal range of motion. Neck supple. No JVD present. Carotid bruit is not present. No thyromegaly present.  Cardiovascular: Normal rate, regular rhythm, normal heart sounds and intact distal pulses.  Exam reveals no gallop.   Pulmonary/Chest: Effort normal and breath sounds normal. No respiratory distress.  Abdominal: Soft. Bowel sounds are normal. She exhibits no distension, no abdominal bruit and no mass. There is no tenderness.  Musculoskeletal: Normal range of motion. She exhibits no edema and no tenderness.  Lymphadenopathy:    She has no cervical adenopathy.  Neurological: She is alert. She has normal reflexes. No cranial nerve deficit. She exhibits normal muscle tone. Coordination normal.  Skin: Skin is warm and dry. No rash noted. No erythema. No pallor.  Psychiatric: She has a normal mood and affect.          Assessment & Plan:

## 2012-04-17 ENCOUNTER — Ambulatory Visit (INDEPENDENT_AMBULATORY_CARE_PROVIDER_SITE_OTHER): Payer: Medicare Other | Admitting: Family Medicine

## 2012-04-17 ENCOUNTER — Other Ambulatory Visit: Payer: Self-pay | Admitting: Family Medicine

## 2012-04-17 ENCOUNTER — Encounter: Payer: Self-pay | Admitting: Family Medicine

## 2012-04-17 VITALS — BP 122/68 | HR 82 | Temp 97.9°F | Wt 184.8 lb

## 2012-04-17 DIAGNOSIS — N898 Other specified noninflammatory disorders of vagina: Secondary | ICD-10-CM

## 2012-04-17 DIAGNOSIS — M545 Low back pain: Secondary | ICD-10-CM

## 2012-04-17 DIAGNOSIS — R300 Dysuria: Secondary | ICD-10-CM

## 2012-04-17 LAB — POCT URINALYSIS DIPSTICK
Glucose, UA: NEGATIVE
Leukocytes, UA: NEGATIVE
Nitrite, UA: NEGATIVE
Spec Grav, UA: 1.015
Urobilinogen, UA: 0.2

## 2012-04-17 NOTE — Patient Instructions (Addendum)
Urine is looking normal today - I've sent off culture today and we will call you with results.  If infection ,we will treat with another course of antibiotic (different than initially).  I will call you over the weekend if positive for infection. Pelvic exam today - overall normal as well.  I'm not sure where these symptoms are coming from - we will await the culture results. For now, may try over the counter azo to help with burning - push fluids and rest.  May use tylenol as well for any discomfort. Watch for worsening symptoms or fever over 101.

## 2012-04-17 NOTE — Assessment & Plan Note (Signed)
Recurring sxs, off abx for last several weeks. Possible vaginal discharge - pelvic exam without significant discharge. UA clear - sent culture. Recommended push fluids and rest.  May try OTC azo.  Will call with results of UCx.

## 2012-04-17 NOTE — Progress Notes (Signed)
  Subjective:    Patient ID: Erin Beck, female    DOB: [birth year], 71 y.o.   MRN: [id number]  HPI CC: bladder infection  UTI 02/28/2012. Seen by Dr. Dayton Martes, with 10 d course cipro (>100k Klebsiella sens cipro and keflex).  Seen again 03/14/2012 by Dr. Algis Downs with continued sxs, extended course of cipro for another 7 days (25k MBM).  Did seem to clear.  Now with 1 wk h/o significant dysuria, lower back ache, feeling tired.  Progressive malaise.  + urgency and incomplete voiding.  Also having some right sided abd pain - going on all of summer.  H/o hysterectomy but R ovary remains.  May have noticed mild vag discharge.  Denies rashes.  No blood in urine.  No fevers/chills, nausea/vomiting.  H/o DM, sugars doing well.  Fasting in am 90s.   Past Medical History  Diagnosis Date  . Anxiety   . Diabetes mellitus     type II  . Diverticulitis     Hx of  . Hyperlipidemia   . Arthritis     OA  . Osteopenia   . GERD (gastroesophageal reflux disease)   . Hypertension   . Allergic rhinitis   . IBS (irritable bowel syndrome)   . Hematuria     HX OF MICROSCOPIC BLOOD IN URINE AND HX OF CYST ON ONE KIDNEY--BUT NO KIDNEY DISEASE    Past Surgical History  Procedure Date  . Abdominal hysterectomy   . Cholecystectomy   . Knee arthroscopy 1990    chondromalacia   . Eye surgery     BILATERAL CATARACTS REMOVED  . Joint replacement 2000    LEFT TOTAL KNEE ARTHROPLASTY  . Total knee arthroplasty 12/05/2011    Procedure: TOTAL KNEE ARTHROPLASTY;  Surgeon: Drucilla Schmidt, MD;  Location: [facility];  Service: Orthopedics;  Laterality: Right;   Review of Systems Per HPI    Objective:   Physical Exam  Nursing note and vitals reviewed. Constitutional: She appears well-developed and well-nourished. No distress.  Abdominal: Soft. Bowel sounds are normal. She exhibits no distension and no mass. There is no hepatosplenomegaly. There is no tenderness. There is no rebound, no guarding and no CVA  tenderness.  Genitourinary: Vagina normal. Pelvic exam was performed with patient supine. No labial fusion. There is no rash, tenderness, lesion or injury on the right labia. There is no rash, tenderness, lesion or injury on the left labia. Right adnexum displays no mass, no tenderness and no fullness. Left adnexum displays no mass, no tenderness and no fullness. No erythema, tenderness or bleeding around the vagina. No signs of injury around the vagina. No vaginal discharge found.       Uterus/cervix absent       Assessment & Plan:

## 2012-04-19 LAB — URINE CULTURE
Colony Count: NO GROWTH
Organism ID, Bacteria: NO GROWTH

## 2012-04-19 LAB — WET PREP BY MOLECULAR PROBE: Gardnerella vaginalis: NEGATIVE

## 2012-09-23 ENCOUNTER — Encounter: Payer: Self-pay | Admitting: Internal Medicine

## 2012-09-25 ENCOUNTER — Ambulatory Visit (INDEPENDENT_AMBULATORY_CARE_PROVIDER_SITE_OTHER): Payer: Medicare Other | Admitting: Family Medicine

## 2012-09-25 ENCOUNTER — Encounter: Payer: Self-pay | Admitting: Family Medicine

## 2012-09-25 VITALS — BP 122/64 | HR 80 | Temp 97.6°F | Wt 181.8 lb

## 2012-09-25 DIAGNOSIS — S8990XA Unspecified injury of unspecified lower leg, initial encounter: Secondary | ICD-10-CM

## 2012-09-25 DIAGNOSIS — J320 Chronic maxillary sinusitis: Secondary | ICD-10-CM | POA: Insufficient documentation

## 2012-09-25 DIAGNOSIS — S99922A Unspecified injury of left foot, initial encounter: Secondary | ICD-10-CM

## 2012-09-25 MED ORDER — DOXYCYCLINE HYCLATE 100 MG PO CAPS: 100.0000 mg | ORAL_CAPSULE | Freq: Two times a day (BID) | ORAL | Status: DC

## 2012-09-25 NOTE — Assessment & Plan Note (Signed)
Crushing injury 3 wks ago, doubt fracture No evidence of infection currently. Discussed continued epsom salt baths, will buddy tape toes together. To update [place] if not improving as expected.

## 2012-09-25 NOTE — Patient Instructions (Signed)
Let's buddy tape big toe to 2nd toe for more support. You have a sinus infection. Take medicine as prescribed: doxycycline twice daily for 10 days. Push fluids and plenty of rest. Nasal saline irrigation or neti pot to help drain sinuses. May use simple mucinex or immediate release guaifenesin with plenty of fluid to help mobilize mucous. Let us know if fever >101.5, trouble opening/closing mouth, difficulty swallowing, or worsening - you may need to be seen again.

## 2012-09-25 NOTE — Assessment & Plan Note (Signed)
7 day into illness, deteriorating. Will treat as bacterial sinusitis with doxycycline course Update [place] if not improving with treatment. See pt instructions for further plan.

## 2012-09-25 NOTE — Progress Notes (Signed)
  Subjective:    Patient ID: Erin Beck, female    DOB: 01-Sep-1940, 72 y.o.   MRN: [id number]  HPI CC: sinusitis?  Woke up this morning with pounding headache.  L facial pain as well.  Facial pain present for last 6-7days.  + earache and ST, PNdrainage.  Subjective fevers/chills.  Endorses chest congestion.  Minimal cough.  Getting worse instead of better.  Hasn't tried anything for this yet.  No abd pain, nausea, tooth pain.  Quit smoking 2011. No sick contacts at home. No h/o asthma, COPD. Endorses "allergy type asthma"  Also would like to have foot evaluated - 3 weeks ago can in pantry fell on left big toe.  Soaking with epsom salts.  Tender, drainage present.  Diabetic Lab Results  Component Value Date   HGBA1C 7.4* 03/28/2012   H/o bilat knee replacement.  Past Medical History  Diagnosis Date  . Anxiety   . Diabetes mellitus     type II  . Diverticulitis     Hx of  . Hyperlipidemia   . Arthritis     OA  . Osteopenia   . GERD (gastroesophageal reflux disease)   . Hypertension   . Allergic rhinitis   . IBS (irritable bowel syndrome)   . Hematuria     HX OF MICROSCOPIC BLOOD IN URINE AND HX OF CYST ON ONE KIDNEY--BUT NO KIDNEY DISEASE    Review of Systems Per HPI    Objective:   Physical Exam  Nursing note and vitals reviewed. Constitutional: She appears well-developed and well-nourished. No distress.  HENT:  Head: Normocephalic and atraumatic.  Right Ear: Hearing, tympanic membrane, external ear and ear canal normal.  Left Ear: Hearing, tympanic membrane, external ear and ear canal normal.  Nose: No mucosal edema or rhinorrhea. Right sinus exhibits no maxillary sinus tenderness and no frontal sinus tenderness. Left sinus exhibits maxillary sinus tenderness and frontal sinus tenderness.  Mouth/Throat: Uvula is midline, oropharynx is clear and moist and mucous membranes are normal. No oropharyngeal exudate, posterior oropharyngeal edema, posterior  oropharyngeal erythema or tonsillar abscesses.       Nasal mucosal irritation L sinus pain to palpation PNdrainage present.  Eyes: Conjunctivae normal and EOM are normal. Pupils are equal, round, and reactive to light. No scleral icterus.  Neck: Normal range of motion. Neck supple.  Cardiovascular: Normal rate, regular rhythm, normal heart sounds and intact distal pulses.   No murmur heard. Pulmonary/Chest: Effort normal and breath sounds normal. No respiratory distress. She has no wheezes. She has no rales.  Musculoskeletal:       L big toe - slightly erythematous, tender to palpation at nailbed base.  No discharge or ungal hematoma.  Slight separation mid medial nail from nailbed but intact at nailbed. No pain with axial loading.  Lymphadenopathy:    She has no cervical adenopathy.  Skin: Skin is warm and dry. No rash noted.       Assessment & Plan:

## 2012-10-17 ENCOUNTER — Other Ambulatory Visit: Payer: Self-pay | Admitting: Family Medicine

## 2012-10-17 ENCOUNTER — Encounter: Payer: Self-pay | Admitting: Family Medicine

## 2012-10-17 ENCOUNTER — Ambulatory Visit (INDEPENDENT_AMBULATORY_CARE_PROVIDER_SITE_OTHER): Payer: Medicare Other | Admitting: Family Medicine

## 2012-10-17 ENCOUNTER — Ambulatory Visit (INDEPENDENT_AMBULATORY_CARE_PROVIDER_SITE_OTHER)
Admission: RE | Admit: 2012-10-17 | Discharge: 2012-10-17 | Disposition: A | Discharge status: Home / Self Care | Payer: Medicare Other | Source: Ambulatory Visit | Attending: Family Medicine | Admitting: Family Medicine

## 2012-10-17 VITALS — BP 116/62 | HR 87 | Temp 98.0°F | Ht 61.0 in | Wt 184.0 lb

## 2012-10-17 DIAGNOSIS — S99922D Unspecified injury of left foot, subsequent encounter: Secondary | ICD-10-CM

## 2012-10-17 DIAGNOSIS — Z5189 Encounter for other specified aftercare: Secondary | ICD-10-CM

## 2012-10-17 DIAGNOSIS — Z1231 Encounter for screening mammogram for malignant neoplasm of breast: Secondary | ICD-10-CM

## 2012-10-17 DIAGNOSIS — K219 Gastro-esophageal reflux disease without esophagitis: Secondary | ICD-10-CM

## 2012-10-17 DIAGNOSIS — Z780 Asymptomatic menopausal state: Secondary | ICD-10-CM

## 2012-10-17 MED ORDER — OMEPRAZOLE 20 MG PO CPDR: 20.0000 mg | DELAYED_RELEASE_CAPSULE | Freq: Every day | ORAL | Status: DC

## 2012-10-17 MED ORDER — DOXYCYCLINE HYCLATE 100 MG PO CAPS: 100.0000 mg | ORAL_CAPSULE | Freq: Two times a day (BID) | ORAL | Status: DC

## 2012-10-17 NOTE — Assessment & Plan Note (Signed)
Return of pain after finishing abx  Will repeat course of doxy Toenail is traumatized laterally Xray today

## 2012-10-17 NOTE — Assessment & Plan Note (Signed)
dexa scheduled Disc prev of OP Will send report to Dr Leslee Home

## 2012-10-17 NOTE — Progress Notes (Signed)
Subjective:    Patient ID: Erin Beck, female    DOB: [birth year], 72 y.o.   MRN: [id number]  HPI Here for several issues - toe/ gerd/ and desire for bone density test  Right great toe - she injured in [place] (dropped a can on it) , is DM  Dr Reece Agar saw it - put her on abx (doxy) It got better and then worse again  Pain came back  It is a bit more red / throbs Not swollen or draining  Nail did separate too  Has been soaking it   Wants to have dexa  Last one was 2006  [facility] on [place] on [address]  Needs it - per Dr Leslee Home (had knee and back problems)   GERD-has come back  Had stopped her medicine  Needs to go back on prilosec 20 mg (generic) ? What caused it to come back Is avoiding tomato cooked/ coffee and spicy  Patient Active Problem List  Diagnosis  . DIABETES MELLITUS, TYPE II  . HYPERLIPIDEMIA  . ANXIETY  . HYPERTENSION, BENIGN ESSENTIAL  . ALLERGIC RHINITIS  . OSTEOARTHRITIS  . DIVERTICULITIS, HX OF  . GERD  . TOBACCO USE, QUIT  . LLQ pain  . IBS (irritable bowel syndrome)  . Hematuria  . Obesity  . Other screening mammogram  . Preoperative examination  . Cough productive of purulent sputum  . Ankle bruise  . Postoperative anemia  . Dysuria  . Left maxillary sinusitis  . Injury of left toe  . Post-menopausal   Past Medical History  Diagnosis Date  . Anxiety   . Diabetes mellitus     type II  . Diverticulitis     Hx of  . Hyperlipidemia   . Arthritis     OA  . Osteopenia   . GERD (gastroesophageal reflux disease)   . Hypertension   . Allergic rhinitis   . IBS (irritable bowel syndrome)   . Hematuria     HX OF MICROSCOPIC BLOOD IN URINE AND HX OF CYST ON ONE KIDNEY--BUT NO KIDNEY DISEASE   Past Surgical History  Procedure Laterality Date  . Abdominal hysterectomy    . Cholecystectomy    . Knee arthroscopy  1990    chondromalacia   . Eye surgery      BILATERAL CATARACTS REMOVED  . Joint replacement  2000    LEFT TOTAL KNEE  ARTHROPLASTY  . Total knee arthroplasty  12/05/2011    Procedure: TOTAL KNEE ARTHROPLASTY;  Surgeon: Drucilla Schmidt, MD;  Location: [facility];  Service: Orthopedics;  Laterality: Right;  . Appendectomy      per pt   History  Substance Use Topics  . Smoking status: Former Smoker -- 1.00 packs/day for 48 years    Quit date: 08/20/2009  . Smokeless tobacco: Never Used  . Alcohol Use: No   Family History  Problem Relation Age of Onset  . Diabetes Father   . Diabetes Brother    Allergies  Allergen Reactions  . Naproxen Rash    Trouble breathing  . Naproxen Sodium Rash    Trouble breathing  . Aspirin     REACTION: burns stomach PT STATES SHE DOES TOLERATE THE 81 MG ASPIRIN DAILY  . Atorvastatin     REACTION: muscle pain  . Buspar (Buspirone Hcl)     Multiple side eff  . Metformin     REACTION: Diarrhea  . Simvastatin     REACTION: GI side eff and swelling  .  Sulfa Antibiotics Other (See Comments)    Severe joint pain  . Sulfonamide Derivatives     REACTION: reaction not known   Current Outpatient Prescriptions on File Prior to Visit  Medication Sig Dispense Refill  . albuterol (PROVENTIL HFA;VENTOLIN HFA) 108 (90 BASE) MCG/ACT inhaler Inhale 2 puffs into the lungs every 6 (six) hours as needed.  1 Inhaler  11  . aspirin 81 MG tablet Take 81 mg by mouth daily.      Marland Kitchen glipiZIDE (GLUCOTROL XL) 10 MG 24 hr tablet Take 10 mg by mouth every morning.      Marland Kitchen glucose blood test strip 1 each 2 (two) times daily. Check blood sugar twice daily and as needed.      Marland Kitchen glucose monitoring kit (FREESTYLE) monitoring kit Check blood sugar once a day and as needed for diabetes 250.0       . hydrochlorothiazide (HYDRODIURIL) 25 MG tablet TAKE 1 TABLET BY MOUTH DAILY  90 tablet  3  . Lancets (FREESTYLE) lancets 2 (two) times daily. Check blood sugar twice daily and as needed.      Marland Kitchen lisinopril (PRINIVIL,ZESTRIL) 10 MG tablet TAKE 1 TABLET (10 MG TOTAL) BY MOUTH DAILY.  90 tablet  3  . niacin 500  MG tablet Take 500 mg by mouth Nightly.        No current facility-administered medications on file prior to visit.     Review of Systems     Objective:   Physical Exam  Constitutional: She appears well-developed and well-nourished. No distress.  HENT:  Head: Normocephalic and atraumatic.  Mouth/Throat: Oropharynx is clear and moist.  Eyes: Conjunctivae and EOM are normal. Pupils are equal, round, and reactive to light.  Neck: Normal range of motion. No JVD present. Carotid bruit is not present. No thyromegaly present.  Cardiovascular: Normal rate, regular rhythm, normal heart sounds and intact distal pulses.  Exam reveals no gallop.   Pulmonary/Chest: Effort normal and breath sounds normal. No respiratory distress. She has no wheezes.  Abdominal: Soft. Bowel sounds are normal. She exhibits no distension, no abdominal bruit and no mass. There is no tenderness. There is no rebound and no guarding.  Musculoskeletal: She exhibits tenderness. She exhibits no edema.  Mild tenderness R great toe with nl rom   Lymphadenopathy:    She has no cervical adenopathy.  Neurological: She is alert. She has normal reflexes.  Skin: Skin is warm and dry. No rash noted. No erythema. No pallor.  R great toenail is laterally traumatized with lifting from nail bed and discoloration  Nail is thickened slightly Not ingrown No redness or swelling of the skin  alst tender   Psychiatric: She has a normal mood and affect.          Assessment & Plan:

## 2012-10-17 NOTE — Patient Instructions (Addendum)
We will schedule bone density test at check out  Xray of toe today  Start back on the doxycycline Take prilosec (omeprazole) daily and let me know if that does not help reflux

## 2012-10-17 NOTE — Assessment & Plan Note (Signed)
Symptoms worse again despite good diet Re start omeprazole 20 daily Rev diet Update if not improved

## 2012-10-27 ENCOUNTER — Telehealth: Payer: Self-pay | Admitting: Family Medicine

## 2012-10-27 DIAGNOSIS — I10 Essential (primary) hypertension: Secondary | ICD-10-CM

## 2012-10-27 DIAGNOSIS — E119 Type 2 diabetes mellitus without complications: Secondary | ICD-10-CM

## 2012-10-27 DIAGNOSIS — E785 Hyperlipidemia, unspecified: Secondary | ICD-10-CM

## 2012-10-27 NOTE — Telephone Encounter (Signed)
Message copied by Judy Pimple on Mon Oct 27, 2012  9:04 AM ------      Message from: Alvina Chou      Created: Tue Oct 21, 2012 12:33 PM      Regarding: Lab orders for Tuesday, 3.11.14       Patient is scheduled for CPX labs, please order future labs, Thanks , Erin Beck       ------

## 2012-10-28 ENCOUNTER — Other Ambulatory Visit: Payer: Medicare Other

## 2012-11-04 ENCOUNTER — Encounter: Payer: Medicare Other | Admitting: Family Medicine

## 2012-11-17 ENCOUNTER — Ambulatory Visit
Admission: RE | Admit: 2012-11-17 | Discharge: 2012-11-17 | Disposition: A | Discharge status: Home / Self Care | Payer: Medicare Other | Source: Ambulatory Visit | Attending: Family Medicine | Admitting: Family Medicine

## 2012-11-17 DIAGNOSIS — Z1231 Encounter for screening mammogram for malignant neoplasm of breast: Secondary | ICD-10-CM

## 2012-11-17 DIAGNOSIS — Z780 Asymptomatic menopausal state: Secondary | ICD-10-CM

## 2012-11-17 LAB — HM DEXA SCAN

## 2012-11-18 ENCOUNTER — Encounter: Payer: Self-pay | Admitting: *Deleted

## 2012-11-19 ENCOUNTER — Encounter: Payer: Self-pay | Admitting: Family Medicine

## 2012-11-19 ENCOUNTER — Encounter: Payer: Self-pay | Admitting: *Deleted

## 2012-12-03 ENCOUNTER — Other Ambulatory Visit: Payer: Self-pay | Admitting: Family Medicine

## 2013-01-04 ENCOUNTER — Other Ambulatory Visit: Payer: Self-pay | Admitting: Family Medicine

## 2013-01-08 ENCOUNTER — Other Ambulatory Visit: Payer: Self-pay | Admitting: Family Medicine

## 2013-02-24 ENCOUNTER — Other Ambulatory Visit (INDEPENDENT_AMBULATORY_CARE_PROVIDER_SITE_OTHER): Payer: Medicare Other

## 2013-02-24 DIAGNOSIS — I10 Essential (primary) hypertension: Secondary | ICD-10-CM

## 2013-02-24 DIAGNOSIS — E785 Hyperlipidemia, unspecified: Secondary | ICD-10-CM

## 2013-02-24 DIAGNOSIS — E119 Type 2 diabetes mellitus without complications: Secondary | ICD-10-CM

## 2013-02-24 LAB — COMPREHENSIVE METABOLIC PANEL
BUN: 20 mg/dL (ref 6–23)
CO2: 26 mEq/L (ref 19–32)
Calcium: 9.2 mg/dL (ref 8.4–10.5)
Chloride: 103 mEq/L (ref 96–112)
Creatinine, Ser: 0.9 mg/dL (ref 0.4–1.2)
GFR: 67.17 mL/min (ref >60.00)
Glucose, Bld: 168 mg/dL — ABNORMAL HIGH (ref 70–99)

## 2013-02-24 LAB — CBC WITH DIFFERENTIAL/PLATELET
Basophils Relative: 0.4 % (ref 0.0–3.0)
Eosinophils Absolute: 0.2 10*3/uL (ref 0.0–0.7)
Hemoglobin: 14.9 g/dL (ref 12.0–15.0)
Lymphocytes Relative: 14.5 % (ref 12.0–46.0)
MCHC: 34.2 g/dL (ref 30.0–36.0)
Monocytes Relative: 5.5 % (ref 3.0–12.0)
Neutro Abs: 7.4 10*3/uL (ref 1.4–7.7)
RBC: 4.74 Mil/uL (ref 3.87–5.11)

## 2013-02-24 LAB — LIPID PANEL
Cholesterol: 245 mg/dL — ABNORMAL HIGH (ref 0–200)
Triglycerides: 265 mg/dL — ABNORMAL HIGH (ref 0.0–149.0)

## 2013-03-03 ENCOUNTER — Ambulatory Visit (INDEPENDENT_AMBULATORY_CARE_PROVIDER_SITE_OTHER): Payer: Medicare Other | Admitting: Family Medicine

## 2013-03-03 ENCOUNTER — Encounter: Payer: Self-pay | Admitting: Family Medicine

## 2013-03-03 VITALS — BP 118/74 | HR 80 | Temp 97.8°F | Ht 61.0 in | Wt 187.5 lb

## 2013-03-03 DIAGNOSIS — I10 Essential (primary) hypertension: Secondary | ICD-10-CM

## 2013-03-03 DIAGNOSIS — E785 Hyperlipidemia, unspecified: Secondary | ICD-10-CM

## 2013-03-03 DIAGNOSIS — Z860100 Personal history of colon polyps, unspecified: Secondary | ICD-10-CM | POA: Insufficient documentation

## 2013-03-03 DIAGNOSIS — E119 Type 2 diabetes mellitus without complications: Secondary | ICD-10-CM

## 2013-03-03 DIAGNOSIS — E669 Obesity, unspecified: Secondary | ICD-10-CM

## 2013-03-03 DIAGNOSIS — Z0000 Encounter for general adult medical examination without abnormal findings: Secondary | ICD-10-CM

## 2013-03-03 NOTE — Assessment & Plan Note (Signed)
bp in fair control at this time  No changes needed  Disc lifstyle change with low sodium diet and exercise  Lab rev 

## 2013-03-03 NOTE — Assessment & Plan Note (Signed)
a1c is up -has been rising Long disc re: diet / need for wt los (pt has exercise limitations) Enc DM ed- will check out options for that  Intol to metformin and seems to get hypoglycemic easily  Will attempt dm ed and re check lab/f/u 3 mo- if no imp may need to disc endocrine consult

## 2013-03-03 NOTE — Assessment & Plan Note (Signed)
Ref ro her 5 year recall colonoscopy

## 2013-03-03 NOTE — Assessment & Plan Note (Signed)
Intol of statins Chol is high Disc goals for lipids and reasons to control them Rev labs with pt Rev low sat fat diet in detail

## 2013-03-03 NOTE — Assessment & Plan Note (Signed)
Discussed how this problem influences overall health and the risks it imposes  Reviewed plan for weight loss with lower calorie diet (via better food choices and also portion control or program like weight watchers) and exercise building up to or more than 30 minutes 5 days per week including some aerobic activity   Pt limited with exercise - ? If she could do water or chair exercise

## 2013-03-03 NOTE — Progress Notes (Signed)
Subjective:    Patient ID: Erin Beck, female    DOB: 1940-10-16, 72 y.o.   MRN: [id number]  HPI I have personally reviewed the Medicare Annual Wellness questionnaire and have noted 1. The patient's medical and social history 2. Their use of alcohol, tobacco or illicit drugs 3. Their current medications and supplements 4. The patient's functional ability including ADL's, fall risks, home safety risks and hearing or visual             impairment. 5. Diet and physical activities 6. Evidence for depression or mood disorders  The patients weight, height, BMI have been recorded in the chart and visual acuity is per eye clinic.  I have made referrals, counseling and provided education to the patient based review of the above and I have provided the pt with a written personalized care plan for preventive services.  Wt is up 3 lb Is difficult to exercise due to chronic back pain/ disc dz and bad knee (never got better after surgery) Can walk a bit with a brace on her back and on her knee   See scanned forms.  Routine anticipatory guidance given to patient.  See health maintenance. Flu- had this fall  Shingles-has not had the vaccine  PNA vaccine in 2009 (she was over 65) Tetanus 11/04 - medicare does not pay for those so she will get at the health dept  Colonoscopy 4/08 - recall was 5 years ? Thinks she needs it - has a hx of polyps  Breast cancer screening 3/14  Self exam -no lumps Pap 11/04 -no gyn symptoms at all  Advance directive- has a living will set and POA  Maurine Minister) Cognitive function addressed- see scanned forms- and if abnormal then additional documentation follows.  No major memory problems   Falls- none   Mood- is very good/ no depression     PMH and SH reviewed  Meds, vitals, and allergies reviewed.   ROS: See HPI.  Otherwise negative.    Diabetes Home sugar results -no low sugars (but that has been a problem in the past) Last DM teaching was before age 28   DM diet - probably not eating DM diet  Exercise - is not optimal -- she cannot do much at all  Symptoms-none  A1C last  Lab Results  Component Value Date   HGBA1C 7.7* 02/24/2013   No problems with medications (unfortunately she gets diarrhea from metformin) Renal protection-on ace  Last eye exam was 6/14- followed by Dr Luciana Axe - having problems     bp is stable today  No cp or palpitations or headaches or edema  No side effects to medicines  BP Readings from Last 3 Encounters:  03/03/13 118/74  10/17/12 116/62  09/25/12 122/64     Hyperlipidemia Lab Results  Component Value Date   CHOL 245* 02/24/2013   CHOL 238* 10/24/2011   CHOL 233* 01/22/2011   Lab Results  Component Value Date   HDL 39.80 02/24/2013   HDL 44.10 10/24/2011   HDL 42.30 01/22/2011   Lab Results  Component Value Date   LDLCALC  Value: 132        Total Cholesterol/HDL:CHD Risk Coronary Heart Disease Risk Table                     Beck   Women  1/2 Average Risk   3.4   3.3  Average Risk       5.0   4.4  2  X Average Risk   9.6   7.1  3 X Average Risk  23.4   11.0        Use the calculated Patient Ratio above and the CHD Risk Table to determine the patient's CHD Risk.        ATP III CLASSIFICATION (LDL):  <100     mg/dL   Optimal  161-096  mg/dL   Near or Above                    Optimal  130-159  mg/dL   Borderline  045-409  mg/dL   High  >811     mg/dL   Very High* 05/03/7828   Lab Results  Component Value Date   TRIG 265.0* 02/24/2013   TRIG 274.0* 10/24/2011   TRIG 220.0* 01/22/2011   Lab Results  Component Value Date   CHOLHDL 6 02/24/2013   CHOLHDL 5 10/24/2011   CHOLHDL 6 01/22/2011   Lab Results  Component Value Date   LDLDIRECT 175.6 02/24/2013   LDLDIRECT 156.2 10/24/2011   LDLDIRECT 178.3 01/22/2011   cannot take statin medicines She tries to keep chol out of diet but has too much cheese   Osteopenia slt imp dexa 3/14  Ca and D  Patient Active Problem List   Diagnosis Date Noted  . Encounter for Medicare  annual wellness exam 03/03/2013  . Post-menopausal 10/17/2012  . Injury of left toe 09/25/2012  . Postoperative anemia 12/26/2011  . Cough productive of purulent sputum 12/12/2011  . Obesity 10/31/2011  . Other screening mammogram 10/31/2011  . IBS (irritable bowel syndrome) 08/10/2011  . GERD 07/31/2010  . ALLERGIC RHINITIS 12/05/2007  . HYPERTENSION, BENIGN ESSENTIAL 02/05/2007  . TOBACCO USE, QUIT 02/05/2007  . DIABETES MELLITUS, TYPE II 01/31/2007  . HYPERLIPIDEMIA 01/31/2007  . ANXIETY 01/31/2007  . OSTEOARTHRITIS 01/31/2007  . DIVERTICULITIS, HX OF 01/31/2007   Past Medical History  Diagnosis Date  . Anxiety   . Diabetes mellitus     type II  . Diverticulitis     Hx of  . Hyperlipidemia   . Arthritis     OA  . Osteopenia   . GERD (gastroesophageal reflux disease)   . Hypertension   . Allergic rhinitis   . IBS (irritable bowel syndrome)   . Hematuria     HX OF MICROSCOPIC BLOOD IN URINE AND HX OF CYST ON ONE KIDNEY--BUT NO KIDNEY DISEASE   Past Surgical History  Procedure Laterality Date  . Abdominal hysterectomy    . Cholecystectomy    . Knee arthroscopy  1990    chondromalacia   . Eye surgery      BILATERAL CATARACTS REMOVED  . Joint replacement  2000    LEFT TOTAL KNEE ARTHROPLASTY  . Total knee arthroplasty  12/05/2011    Procedure: TOTAL KNEE ARTHROPLASTY;  Surgeon: Drucilla Schmidt, MD;  Location: [facility];  Service: Orthopedics;  Laterality: Right;  . Appendectomy      per pt   History  Substance Use Topics  . Smoking status: Former Smoker -- 1.00 packs/day for 48 years    Quit date: 08/20/2009  . Smokeless tobacco: Never Used  . Alcohol Use: No   Family History  Problem Relation Age of Onset  . Diabetes Father   . Diabetes Brother    Allergies  Allergen Reactions  . Naproxen Rash    Trouble breathing  . Naproxen Sodium Rash    Trouble breathing  . Aspirin  REACTION: burns stomach PT STATES SHE DOES TOLERATE THE 81 MG ASPIRIN DAILY   . Atorvastatin     REACTION: muscle pain  . Buspar (Buspirone Hcl)     Multiple side eff  . Metformin     REACTION: Diarrhea  . Simvastatin     REACTION: GI side eff and swelling  . Sulfa Antibiotics Other (See Comments)    Severe joint pain  . Sulfonamide Derivatives     REACTION: reaction not known   Current Outpatient Prescriptions on File Prior to Visit  Medication Sig Dispense Refill  . albuterol (PROVENTIL HFA;VENTOLIN HFA) 108 (90 BASE) MCG/ACT inhaler Inhale 2 puffs into the lungs every 6 (six) hours as needed.  1 Inhaler  11  . aspirin 81 MG tablet Take 81 mg by mouth daily.      Marland Kitchen glipiZIDE (GLUCOTROL XL) 10 MG 24 hr tablet TAKE 1 TABLET BY MOUTH DAILY  30 tablet  3  . glucose blood test strip 1 each 2 (two) times daily. Check blood sugar twice daily and as needed.      Marland Kitchen glucose monitoring kit (FREESTYLE) monitoring kit Check blood sugar once a day and as needed for diabetes 250.0       . hydrochlorothiazide (HYDRODIURIL) 25 MG tablet TAKE 1 TABLET BY MOUTH DAILY  90 tablet  0  . Lancets (FREESTYLE) lancets 2 (two) times daily. Check blood sugar twice daily and as needed.      Marland Kitchen lisinopril (PRINIVIL,ZESTRIL) 10 MG tablet TAKE 1 TABLET (10 MG TOTAL) BY MOUTH DAILY.  90 tablet  0  . omeprazole (PRILOSEC) 20 MG capsule Take 1 capsule (20 mg total) by mouth daily.  90 capsule  3   No current facility-administered medications on file prior to visit.     Review of Systems Review of Systems  Constitutional: Negative for fever, appetite change, fatigue and unexpected weight change.  Eyes: Negative for pain and visual disturbance.  Respiratory: Negative for cough and shortness of breath.   Cardiovascular: Negative for cp or palpitations    Gastrointestinal: Negative for nausea, diarrhea and constipation.  Genitourinary: Negative for urgency and frequency.  Skin: Negative for pallor or rash MSK pos for back and knee pain with mobility limitation   Neurological: Negative for  weakness, light-headedness, numbness and headaches.  Hematological: Negative for adenopathy. Does not bruise/bleed easily.  Psychiatric/Behavioral: Negative for dysphoric mood. The patient is not nervous/anxious.         Objective:   Physical Exam  Constitutional: She appears well-developed and well-nourished. No distress.  obese and well appearing   HENT:  Head: Normocephalic and atraumatic.  Right Ear: External ear normal.  Left Ear: External ear normal.  Nose: Nose normal.  Mouth/Throat: Oropharynx is clear and moist.  Eyes: Conjunctivae and EOM are normal. Pupils are equal, round, and reactive to light. Right eye exhibits no discharge. Left eye exhibits no discharge. No scleral icterus.  Neck: Normal range of motion. Neck supple. No JVD present. Carotid bruit is not present. No thyromegaly present.  Cardiovascular: Normal rate, regular rhythm, normal heart sounds and intact distal pulses.  Exam reveals no gallop.   Pulmonary/Chest: Effort normal and breath sounds normal. No respiratory distress. She has no wheezes. She has no rales.  No crackles   Abdominal: Soft. Bowel sounds are normal. She exhibits no distension, no abdominal bruit and no mass. There is no tenderness.  Genitourinary: No breast swelling, tenderness, discharge or bleeding.  Breast exam: No mass, nodules, thickening,  tenderness, bulging, retraction, inflamation, nipple discharge or skin changes noted.  No axillary or clavicular LA.  Chaperoned exam.    Musculoskeletal: She exhibits no edema and no tenderness.  Lymphadenopathy:    She has no cervical adenopathy.  Neurological: She is alert. She has normal reflexes. No cranial nerve deficit. She exhibits normal muscle tone. Coordination normal.  Skin: Skin is warm and dry. No rash noted. No erythema. No pallor.  Psychiatric: She has a normal mood and affect.          Assessment & Plan:

## 2013-03-03 NOTE — Patient Instructions (Addendum)
If you are interested in a shingles/zoster vaccine - call your insurance to check on coverage,( you should not get it within 1 month of other vaccines) , then call us for a prescription  for it to take to a pharmacy that gives the shot , or make a nurse visit to get it here depending on your coverage You will be due for a tetanus shot in the fall - since medicare does not pay for them- get one at the health dept Stop up front for your colonoscopy referral  Go to [facility] pharmacy and talk to [place] about diabetic education program - if you decide to enroll- let me know  Work on better diet and weight loss/ exercise as tolerated  Take care of yourself  Follow up in 3 months with labs prior

## 2013-03-03 NOTE — Assessment & Plan Note (Signed)
Reviewed health habits including diet and exercise and skin cancer prevention Also reviewed health mt list, fam hx and immunizations  See HPI Will look into coverage for zoster vaccine Get Tdap at health dept since medicare will not pay  Rev health mt

## 2013-03-05 ENCOUNTER — Encounter: Payer: Self-pay | Admitting: Internal Medicine

## 2013-03-27 ENCOUNTER — Other Ambulatory Visit: Payer: Self-pay | Admitting: Family Medicine

## 2013-04-07 ENCOUNTER — Other Ambulatory Visit: Payer: Self-pay | Admitting: Family Medicine

## 2013-04-13 ENCOUNTER — Other Ambulatory Visit: Payer: Self-pay | Admitting: Family Medicine

## 2013-04-22 ENCOUNTER — Telehealth: Payer: Self-pay | Admitting: Family Medicine

## 2013-04-22 NOTE — Telephone Encounter (Signed)
Pt left vm stating that she just left Dr. Luciana Axe (eye dr) and he scheduled her for surgery on 9/17.  He said that she would need to take an abx 10 days before the procedure but he does give them.  If she needs an OV she will need to be seen this week before she leaves [place] the [place].

## 2013-04-22 NOTE — Telephone Encounter (Signed)
Please send for note from Dr Luciana Axe - what surgery specifically is she having and did he tell her any abx names specifically?

## 2013-04-23 ENCOUNTER — Emergency Department (HOSPITAL_COMMUNITY): Payer: Medicare Other

## 2013-04-23 ENCOUNTER — Telehealth: Payer: Self-pay | Admitting: Family Medicine

## 2013-04-23 ENCOUNTER — Observation Stay (HOSPITAL_COMMUNITY)
Admission: EM | Admit: 2013-04-23 | Discharge: 2013-04-23 | Disposition: A | Discharge status: Home / Self Care | Payer: Medicare Other | Attending: Internal Medicine | Admitting: Internal Medicine

## 2013-04-23 ENCOUNTER — Ambulatory Visit: Payer: Medicare Other | Admitting: Internal Medicine

## 2013-04-23 ENCOUNTER — Encounter (HOSPITAL_COMMUNITY): Payer: Self-pay | Admitting: Radiology

## 2013-04-23 DIAGNOSIS — K589 Irritable bowel syndrome without diarrhea: Secondary | ICD-10-CM | POA: Insufficient documentation

## 2013-04-23 DIAGNOSIS — M1990 Unspecified osteoarthritis, unspecified site: Secondary | ICD-10-CM | POA: Insufficient documentation

## 2013-04-23 DIAGNOSIS — R0789 Other chest pain: Principal | ICD-10-CM | POA: Diagnosis present

## 2013-04-23 DIAGNOSIS — M899 Disorder of bone, unspecified: Secondary | ICD-10-CM | POA: Insufficient documentation

## 2013-04-23 DIAGNOSIS — K219 Gastro-esophageal reflux disease without esophagitis: Secondary | ICD-10-CM | POA: Diagnosis present

## 2013-04-23 DIAGNOSIS — Z79899 Other long term (current) drug therapy: Secondary | ICD-10-CM | POA: Insufficient documentation

## 2013-04-23 DIAGNOSIS — K5730 Diverticulosis of large intestine without perforation or abscess without bleeding: Secondary | ICD-10-CM | POA: Insufficient documentation

## 2013-04-23 DIAGNOSIS — J45909 Unspecified asthma, uncomplicated: Secondary | ICD-10-CM | POA: Diagnosis present

## 2013-04-23 DIAGNOSIS — E785 Hyperlipidemia, unspecified: Secondary | ICD-10-CM | POA: Insufficient documentation

## 2013-04-23 DIAGNOSIS — E1169 Type 2 diabetes mellitus with other specified complication: Secondary | ICD-10-CM | POA: Diagnosis present

## 2013-04-23 DIAGNOSIS — E669 Obesity, unspecified: Secondary | ICD-10-CM | POA: Diagnosis present

## 2013-04-23 DIAGNOSIS — J309 Allergic rhinitis, unspecified: Secondary | ICD-10-CM | POA: Diagnosis present

## 2013-04-23 DIAGNOSIS — R079 Chest pain, unspecified: Secondary | ICD-10-CM

## 2013-04-23 DIAGNOSIS — J0100 Acute maxillary sinusitis, unspecified: Secondary | ICD-10-CM

## 2013-04-23 DIAGNOSIS — I10 Essential (primary) hypertension: Secondary | ICD-10-CM | POA: Diagnosis present

## 2013-04-23 DIAGNOSIS — F419 Anxiety disorder, unspecified: Secondary | ICD-10-CM | POA: Diagnosis present

## 2013-04-23 DIAGNOSIS — F411 Generalized anxiety disorder: Secondary | ICD-10-CM | POA: Insufficient documentation

## 2013-04-23 DIAGNOSIS — E119 Type 2 diabetes mellitus without complications: Secondary | ICD-10-CM | POA: Insufficient documentation

## 2013-04-23 DIAGNOSIS — E66811 Obesity, class 1: Secondary | ICD-10-CM | POA: Diagnosis present

## 2013-04-23 HISTORY — DX: Unspecified asthma, uncomplicated: J45.909

## 2013-04-23 LAB — URINALYSIS, ROUTINE W REFLEX MICROSCOPIC
Glucose, UA: NEGATIVE mg/dL
Ketones, ur: NEGATIVE mg/dL
Leukocytes, UA: NEGATIVE
Nitrite: NEGATIVE
Specific Gravity, Urine: 1.018 (ref 1.005–1.030)
pH: 7 (ref 5.0–8.0)

## 2013-04-23 LAB — CBC WITH DIFFERENTIAL/PLATELET
Basophils Relative: 1 % (ref 0–1)
HCT: 42.1 % (ref 36.0–46.0)
Hemoglobin: 14.8 g/dL (ref 12.0–15.0)
Lymphs Abs: 1.6 10*3/uL (ref 0.7–4.0)
MCH: 31.6 pg (ref 26.0–34.0)
MCHC: 35.2 g/dL (ref 30.0–36.0)
Monocytes Absolute: 0.5 10*3/uL (ref 0.1–1.0)
Monocytes Relative: 6 % (ref 3–12)
Neutro Abs: 5.8 10*3/uL (ref 1.7–7.7)
RBC: 4.68 MIL/uL (ref 3.87–5.11)

## 2013-04-23 LAB — BASIC METABOLIC PANEL
BUN: 13 mg/dL (ref 6–23)
Chloride: 98 mEq/L (ref 96–112)
GFR calc Af Amer: >90 mL/min (ref >90)
Glucose, Bld: 155 mg/dL — ABNORMAL HIGH (ref 70–99)
Potassium: 3.9 mEq/L (ref 3.5–5.1)

## 2013-04-23 LAB — TROPONIN I: Troponin I: <0.3 ng/mL (ref <0.30)

## 2013-04-23 MED ORDER — ACETAMINOPHEN 325 MG PO TABS: 650.0000 mg | ORAL_TABLET | Freq: Four times a day (QID) | ORAL | Status: DC | PRN

## 2013-04-23 MED ORDER — METHOCARBAMOL 100 MG/ML IJ SOLN
500.0000 mg | Freq: Once | INTRAVENOUS | Status: AC
  Administered 2013-04-23: 500 mg via INTRAVENOUS
  Filled 2013-04-23: qty 5

## 2013-04-23 MED ORDER — IOHEXOL 350 MG/ML SOLN
100.0000 mL | Freq: Once | INTRAVENOUS | Status: AC | PRN
  Administered 2013-04-23: 100 mL via INTRAVENOUS

## 2013-04-23 MED ORDER — ONDANSETRON HCL 4 MG/2ML IJ SOLN: 4.0000 mg | Freq: Three times a day (TID) | INTRAMUSCULAR | Status: DC | PRN

## 2013-04-23 MED ORDER — ACETAMINOPHEN 500 MG PO TABS
1000.0000 mg | ORAL_TABLET | Freq: Once | ORAL | Status: AC
  Administered 2013-04-23: 1000 mg via ORAL
  Filled 2013-04-23: qty 2

## 2013-04-23 MED ORDER — SODIUM CHLORIDE 0.9 % IV BOLUS (SEPSIS)
1000.0000 mL | Freq: Once | INTRAVENOUS | Status: AC
Start: 2013-04-23 — End: 2013-04-23
  Administered 2013-04-23: 1000 mL via INTRAVENOUS

## 2013-04-23 MED ORDER — FLUTICASONE PROPIONATE 50 MCG/ACT NA SUSP: 2.0000 | Freq: Every day | NASAL | Status: DC

## 2013-04-23 MED ORDER — HYDROCODONE-ACETAMINOPHEN 5-325 MG PO TABS: 1.0000 | ORAL_TABLET | Freq: Four times a day (QID) | ORAL | Status: DC | PRN

## 2013-04-23 MED ORDER — AMOXICILLIN-POT CLAVULANATE 875-125 MG PO TABS
1.0000 | ORAL_TABLET | Freq: Once | ORAL | Status: AC
  Administered 2013-04-23: 1 via ORAL
  Filled 2013-04-23: qty 1

## 2013-04-23 MED ORDER — METHOCARBAMOL 500 MG PO TABS: 500.0000 mg | ORAL_TABLET | Freq: Three times a day (TID) | ORAL | Status: DC | PRN

## 2013-04-23 MED ORDER — AMOXICILLIN-POT CLAVULANATE 875-125 MG PO TABS: 1.0000 | ORAL_TABLET | Freq: Two times a day (BID) | ORAL | Status: DC

## 2013-04-23 NOTE — Telephone Encounter (Signed)
Patient Information:  Caller Name: Avryl  Phone: [phone]  Patient: Erin Beck  Gender: Female  DOB: 1941/05/09  Age: 72 Years  PCP: Roxy Manns [facility])  Office Follow Up:  Does the office need to follow up with this patient?: No  Instructions For The Office: N/A  RN Note:  Took 81 mg ASA this morning.  Instructed to take 3 more 81 mg ASA now with sip of water and call 911.  Agreed to call 911 now.  Symptoms  Reason For Call & Symptoms: Right sided chest pain (pressure and soreness, pain shooting out of nipple)) radiating to back, right side of neck and right arm. Onset 0315;  Pain initially sharp but now dull, rated 3/10.  Initially became diaphoretic. Weakness present. FBS 156. Similiar chest pain incident 04/21/13 waking her from sleep.  Reviewed Health History In EMR: N/A  Reviewed Medications In EMR: N/A  Reviewed Allergies In EMR: N/A  Reviewed Surgeries / Procedures: N/A  Date of Onset of Symptoms: 04/23/2013  Treatments Tried: ASA 81 mg  Treatments Tried Worked: No  Guideline(s) Used:  Chest Pain  Disposition Per Guideline:   Call EMS 911 Now  Reason For Disposition Reached:   Chest pain lasting longer than 5 minutes and ANY of the following:  Over 68 years old Over 28 years old and at least one cardiac risk factor (i.e., high blood pressure, diabetes, high cholesterol, obesity, smoker or strong family history of heart disease) Pain is crushing, pressure-like, or heavy  Took nitroglycerin and chest pain was not relieved History of heart disease (i.e., angina, heart attack, bypass surgery, angioplasty, CHF)  Advice Given:  N/A  Patient Will Follow Care Advice:  YES

## 2013-04-23 NOTE — H&P (Signed)
[facility] History and Physical  Erin Beck WUX:[id number] DOB: 1940/10/28 DOA: 04/23/2013  Referring physician: EDP PCP: Roxy Manns, MD    Chief Complaint: right chest pain  HPI: Erin Beck is a 72 y.o. female who presents with right sided chest pain.  Per EDP, initially c/o substernal CP, which patient currently denies.  Worse with repositioning.  Also c/o sinus congestion, post nasal drip, maxillary sinus pressure and occasional right ear pain for several weeks or more.  Had subjective fever and diaphoresis earlier this morning.  Called PCP's office to request abx, but referred to ED for chest pain.  Troponin, EKG, CTA chest OK.  Had low risk myoview in 2011.  Patient requesting discharge.  Review of Systems: systems reviewed.   Past Medical History  Diagnosis Date  . Anxiety   . Diabetes mellitus     type II  . Diverticulitis     Hx of  . Hyperlipidemia   . Arthritis     OA  . Osteopenia   . GERD (gastroesophageal reflux disease)   . Hypertension   . Allergic rhinitis   . IBS (irritable bowel syndrome)   . Hematuria     HX OF MICROSCOPIC BLOOD IN URINE AND HX OF CYST ON ONE KIDNEY--BUT NO KIDNEY DISEASE  . Asthma    Past Surgical History  Procedure Laterality Date  . Abdominal hysterectomy    . Cholecystectomy    . Knee arthroscopy  1990    chondromalacia   . Joint replacement  2000    LEFT TOTAL KNEE ARTHROPLASTY  . Total knee arthroplasty  12/05/2011    Procedure: TOTAL KNEE ARTHROPLASTY;  Surgeon: Drucilla Schmidt, MD;  Location: [facility] ORS;  Service: Orthopedics;  Laterality: Right;  . Appendectomy      per pt  . Eye surgery      BILATERAL CATARACTS REMOVED   Social History:  reports that she quit smoking about 3 years ago. She has never used smokeless tobacco. She reports that she does not drink alcohol or use illicit drugs. Married  Allergies  Allergen Reactions  . Naproxen Rash    Trouble breathing  . Naproxen Sodium Rash    Trouble  breathing  . Aspirin     REACTION: burns stomach PT STATES SHE DOES TOLERATE THE 81 MG ASPIRIN DAILY  . Atorvastatin     REACTION: muscle pain  . Buspar [Buspirone Hcl]     Multiple side eff  . Metformin     REACTION: Diarrhea  . Simvastatin     REACTION: GI side eff and swelling  . Sulfa Antibiotics Other (See Comments)    Severe joint pain  . Sulfonamide Derivatives     REACTION: reaction not known    Family History  Problem Relation Age of Onset  . Diabetes Father   . Diabetes Brother   father: Gallbladder cancer   Prior to Admission medications   Medication Sig Start Date End Date Taking? Authorizing Provider  albuterol (PROVENTIL HFA;VENTOLIN HFA) 108 (90 BASE) MCG/ACT inhaler Inhale 2 puffs into the lungs every 6 (six) hours as needed. 03/28/12  Yes Judy Pimple, MD  aspirin 81 MG tablet Take 81 mg by mouth daily.   Yes Historical Provider, MD  glipiZIDE (GLUCOTROL XL) 10 MG 24 hr tablet Take 10 mg by mouth daily.   Yes Historical Provider, MD  glucose blood test strip 1 each 2 (two) times daily. Check blood sugar twice daily and as needed.   Yes  Historical Provider, MD  hydrochlorothiazide (HYDRODIURIL) 25 MG tablet Take 25 mg by mouth daily.   Yes Historical Provider, MD  Lancets (FREESTYLE) lancets 2 (two) times daily. Check blood sugar twice daily and as needed.   Yes Historical Provider, MD  lisinopril (PRINIVIL,ZESTRIL) 10 MG tablet Take 10 mg by mouth daily.   Yes Historical Provider, MD  omeprazole (PRILOSEC) 20 MG capsule Take 1 capsule (20 mg total) by mouth daily. 10/17/12  Yes Judy Pimple, MD  acetaminophen (TYLENOL) 325 MG tablet Take 2 tablets (650 mg total) by mouth every 6 (six) hours as needed. 04/23/13   Christiane Ha, MD  amoxicillin-clavulanate (AUGMENTIN) 875-125 MG per tablet Take 1 tablet by mouth 2 (two) times daily. 04/23/13   Christiane Ha, MD  fluticasone (FLONASE) 50 MCG/ACT nasal spray Place 2 sprays into the nose daily. 04/23/13   Christiane Ha, MD  HYDROcodone-acetaminophen (NORCO/VICODIN) 5-325 MG per tablet Take 1 tablet by mouth every 6 (six) hours as needed for pain. 04/23/13   Christiane Ha, MD  methocarbamol (ROBAXIN) 500 MG tablet Take 1 tablet (500 mg total) by mouth every 8 (eight) hours as needed (pain/spasm). 04/23/13   Christiane Ha, MD   Physical Exam: Filed Vitals:   04/23/13 1557  BP: 113/49  Pulse: 69  Temp: 97.8 F (36.6 C)  Resp:    BP 113/49  Pulse 69  Temp(Src) 97.8 F (36.6 C) (Oral)  Resp 21  SpO2 96%  General Appearance:    Alert, cooperative, tearful, anxious  Head:    Normocephalic, without obvious abnormality, atraumatic  Eyes:    PERRL, conjunctiva/corneas clear, EOM's intact, fundi    benign, both eyes  Ears:    Right ear without tenderness. Unable to visualize TM due to Cerumen.  Canal without erythema  Nose:   Nares without drainage.  Left greater than right maxillary sinus tenderness  Throat:   Lips, mucosa, and tongue normal; teeth and gums normal  Neck:   Supple, symmetrical, trachea midline, no adenopathy;    thyroid:  no enlargement/tenderness/nodules; no carotid   bruit or JVD  Back:     Symmetric, no curvature, ROM normal, no CVA tenderness  Lungs:     Clear to auscultation bilaterally, respirations unlabored  Chest Wall:    Reproducible right chest pain   Heart:    Regular rate and rhythm, S1 and S2 normal, no murmur, rub   or gallop  Breast Exam:    No tenderness, masses, or nipple abnormality  Abdomen:     Soft, non-tender, bowel sounds active all four quadrants,    no masses, no organomegaly  Genitalia:    Deferred  Rectal:    Deferred  Extremities:   Extremities normal, atraumatic, no cyanosis or edema  Pulses:   2+ and symmetric all extremities  Skin:   Skin color, texture, turgor normal, no rashes or lesions  Lymph nodes:   Cervical, supraclavicular, and axillary nodes normal  Neurologic:   CNII-XII intact, normal strength, sensation and reflexes     throughout    Psychiatric:  Tearful and anxious  Labs on Admission:  Basic Metabolic Panel:  Recent Labs Lab 04/23/13 1040  NA 136  K 3.9  CL 98  CO2 26  GLUCOSE 155*  BUN 13  CREATININE 0.70  CALCIUM 9.4   Liver Function Tests: No results found for this basename: AST, ALT, ALKPHOS, BILITOT, PROT, ALBUMIN,  in the last 168 hours No results found for this  basename: LIPASE, AMYLASE,  in the last 168 hours No results found for this basename: AMMONIA,  in the last 168 hours CBC:  Recent Labs Lab 04/23/13 1040  WBC 8.1  NEUTROABS 5.8  HGB 14.8  HCT 42.1  MCV 90.0  PLT 233   Cardiac Enzymes:  Recent Labs Lab 04/23/13 1040  TROPONINI <0.30    BNP (last 3 results) No results found for this basename: PROBNP,  in the last 8760 hours CBG: No results found for this basename: GLUCAP,  in the last 168 hours  Radiological Exams on Admission: Dg Chest 2 View  04/23/2013   *RADIOLOGY REPORT*  Clinical Data: Chest pain and shortness of breath  CHEST - 2 VIEW  Comparison: November 26, 2011  Findings: Lungs clear.  Heart size and pulmonary vascularity are normal.  No adenopathy.  There is degenerative change in the thoracic spine. Subtle aortic valve calcification again noted without appreciable progression.  IMPRESSION: No edema or consolidation.   Original Report Authenticated By: Bretta Bang, M.D.   Ct Angio Chest Pe W/cm &/or Wo Cm  04/23/2013   *RADIOLOGY REPORT*  Clinical Data: Right-sided chest pain and shortness of breath for 3 days.  CT ANGIOGRAPHY CHEST  Technique:  Multidetector CT imaging of the chest using the standard protocol during bolus administration of intravenous contrast. Multiplanar reconstructed images including MIPs were obtained and reviewed to evaluate the vascular anatomy.  Contrast: OMNIPAQUE IOHEXOL 350 MG/ML SOLN  Comparison: 11/17/2009.  Findings: No evidence pulmonary embolus.  Heart size top normal.  Coronary artery calcifications.  No evidence of  thoracic aortic dissection.  Atherosclerotic type changes of the thoracic aorta.  Ascending thoracic aorta measures up to 3.4 cm.  Calcification narrowing origin of the great vessels.  Scattered normal to top normal sized mediastinal and hilar lymph nodes.  Minimal pulmonary vascular prominence/dependent atelectasis without worrisome lung lesion detected.  Degenerative changes thoracic spine with mild thoracic kyphosis without focal compression deformity or bony destructive lesion.  Slightly asymmetric breast parenchyma with scattered calcifications.  Correlation with mammography recommended.  Visualized upper abdominal structures with the prior cholecystectomy and mild fatty infiltration of the liver.  IMPRESSION: No evidence pulmonary embolus.  Heart size top normal.  Coronary artery calcifications.  No evidence of thoracic aortic dissection.  Atherosclerotic type changes of the thoracic aorta.  Ascending thoracic aorta measures up to 3.4 cm.  Calcification narrowing origin of the great vessels.  Minimal pulmonary vascular prominence/dependent atelectasis without worrisome lung lesion detected.  Degenerative changes thoracic spine with mild thoracic kyphosis without focal compression deformity or bony destructive lesion.  Slightly asymmetric breast parenchyma with scattered calcifications.  Correlation with mammography recommended.   Original Report Authenticated By: Lacy Duverney, M.D.    EKG: NSR  Assessment/Plan Principal Problem:   Musculoskeletal chest pain: reproducible.  Doubt cardiac.  Patient wants to go home.  If repeat troponin negative, discharge home.  Allergic to NSAIDS.  Will give tylenol and robaxin.  Home with same or vicodin if stable and troponin negative. Active Problems:   DIABETES MELLITUS, TYPE II   HYPERLIPIDEMIA   ANXIETY   HYPERTENSION, BENIGN ESSENTIAL   ALLERGIC RHINITIS   GERD   Obesity   Acute maxillary sinus   Erin Beck [facility] Pager  [phone] If 7PM-7AM, please contact night-coverage [email or web address] Password [facility] 04/23/2013, 6:17 PM

## 2013-04-23 NOTE — Telephone Encounter (Signed)
Spoke with Husband and pt was admitted to Hospital, Husband said she should be back tomorrow, will call back once pt is D/C from hospital

## 2013-04-23 NOTE — ED Notes (Signed)
Per [facility] pt was awoke at 0315 this am with midsternal chest pressure radiating to her R arm.  She states that she took 648 mg of Aspirin prior to EMS arrival and 1 SL NTG 0.4 with total pain relief.  No distress noted.  Pt is sinus rhythm.

## 2013-04-23 NOTE — Progress Notes (Signed)
Discharge instructions given to patient and husband .Patient verbalized understanding of discharged instructions .Patient discharged home with husband.

## 2013-04-23 NOTE — ED Notes (Signed)
Family at bedside. 

## 2013-04-23 NOTE — ED Notes (Signed)
Patient transported to X-ray 

## 2013-04-23 NOTE — Discharge Summary (Signed)
Physician Discharge Summary  Erin Beck WJX:[id number] DOB: 11-21-1940 DOA: 04/23/2013  PCP: Roxy Manns, MD  Admit date: 04/23/2013 Discharge date: 04/23/2013  Discharge Diagnoses:  Principal Problem:   Musculoskeletal chest pain Active Problems:   DIABETES MELLITUS, TYPE II   HYPERLIPIDEMIA   ANXIETY   HYPERTENSION, BENIGN ESSENTIAL   ALLERGIC RHINITIS   GERD   Obesity   Acute maxillary sinusitis   Unspecified asthma(493.90)   Discharge Condition: stable  History of present illness:  Erin Beck is a 72 y.o. female who presents with right sided chest pain. Per EDP, initially c/o substernal CP, which patient currently denies. Worse with repositioning. Also c/o sinus congestion, post nasal drip, maxillary sinus pressure and occasional right ear pain for several weeks or more. Had subjective fever and diaphoresis earlier this morning. Called PCP's office to request abx, but referred to ED for chest pain. Troponin, EKG, CTA chest OK. Had low risk myoview in 2011. Patient requesting discharge.  Repeat troponins negative. Did have maxillary sinus tenderness and was started on Augmentin, Flonase, Tylenol, Robaxin. May take Vicodin as needed for severe pain.  Discharge Exam: Filed Vitals:   04/23/13 1557  BP: 113/49  Pulse: 69  Temp: 97.8 F (36.6 C)  Resp:    See H&P done same day  Discharge Instructions  Discharge Orders   Future Appointments Provider Department Dept Phone   05/27/2013 8:35 AM [facility] [place] [phone]   06/05/2013 11:15 AM Judy Pimple, MD  [facility] [phone]   Future Orders Complete By Expires   Activity as tolerated - No restrictions  As directed    Diet - low sodium heart healthy  As directed    Diet Carb Modified  As directed        Medication List         acetaminophen 325 MG tablet  Commonly known as:  TYLENOL  Take 2 tablets (650 mg total) by mouth every 6 (six) hours as  needed.     albuterol 108 (90 BASE) MCG/ACT inhaler  Commonly known as:  PROVENTIL HFA;VENTOLIN HFA  Inhale 2 puffs into the lungs every 6 (six) hours as needed.     amoxicillin-clavulanate 875-125 MG per tablet  Commonly known as:  AUGMENTIN  Take 1 tablet by mouth 2 (two) times daily.     aspirin 81 MG tablet  Take 81 mg by mouth daily.     fluticasone 50 MCG/ACT nasal spray  Commonly known as:  FLONASE  Place 2 sprays into the nose daily.     freestyle lancets  2 (two) times daily. Check blood sugar twice daily and as needed.     glipiZIDE 10 MG 24 hr tablet  Commonly known as:  GLUCOTROL XL  Take 10 mg by mouth daily.     glucose blood test strip  1 each 2 (two) times daily. Check blood sugar twice daily and as needed.     hydrochlorothiazide 25 MG tablet  Commonly known as:  HYDRODIURIL  Take 25 mg by mouth daily.     HYDROcodone-acetaminophen 5-325 MG per tablet  Commonly known as:  NORCO/VICODIN  Take 1 tablet by mouth every 6 (six) hours as needed for pain.     lisinopril 10 MG tablet  Commonly known as:  PRINIVIL,ZESTRIL  Take 10 mg by mouth daily.     methocarbamol 500 MG tablet  Commonly known as:  ROBAXIN  Take 1 tablet (500 mg total) by  mouth every 8 (eight) hours as needed (pain/spasm).     omeprazole 20 MG capsule  Commonly known as:  PRILOSEC  Take 1 capsule (20 mg total) by mouth daily.       Allergies  Allergen Reactions  . Naproxen Rash    Trouble breathing  . Naproxen Sodium Rash    Trouble breathing  . Aspirin     REACTION: burns stomach PT STATES SHE DOES TOLERATE THE 81 MG ASPIRIN DAILY  . Atorvastatin     REACTION: muscle pain  . Buspar [Buspirone Hcl]     Multiple side eff  . Metformin     REACTION: Diarrhea  . Simvastatin     REACTION: GI side eff and swelling  . Sulfa Antibiotics Other (See Comments)    Severe joint pain  . Sulfonamide Derivatives     REACTION: reaction not known       Follow-up Information   Follow  up with Roxy Manns, MD. (If symptoms worsen)    Specialties:  Family Medicine, Radiology   Contact information:   [address] [place] [address] [place] [address] [phone]        The results of significant diagnostics from this hospitalization (including imaging, microbiology, ancillary and laboratory) are listed below for reference.    Significant Diagnostic Studies: Dg Chest 2 View  04/23/2013   *RADIOLOGY REPORT*  Clinical Data: Chest pain and shortness of breath  CHEST - 2 VIEW  Comparison: November 26, 2011  Findings: Lungs clear.  Heart size and pulmonary vascularity are normal.  No adenopathy.  There is degenerative change in the thoracic spine. Subtle aortic valve calcification again noted without appreciable progression.  IMPRESSION: No edema or consolidation.   Original Report Authenticated By: Bretta Bang, M.D.   Ct Angio Chest Pe W/cm &/or Wo Cm  04/23/2013   *RADIOLOGY REPORT*  Clinical Data: Right-sided chest pain and shortness of breath for 3 days.  CT ANGIOGRAPHY CHEST  Technique:  Multidetector CT imaging of the chest using the standard protocol during bolus administration of intravenous contrast. Multiplanar reconstructed images including MIPs were obtained and reviewed to evaluate the vascular anatomy.  Contrast: OMNIPAQUE IOHEXOL 350 MG/ML SOLN  Comparison: 11/17/2009.  Findings: No evidence pulmonary embolus.  Heart size top normal.  Coronary artery calcifications.  No evidence of thoracic aortic dissection.  Atherosclerotic type changes of the thoracic aorta.  Ascending thoracic aorta measures up to 3.4 cm.  Calcification narrowing origin of the great vessels.  Scattered normal to top normal sized mediastinal and hilar lymph nodes.  Minimal pulmonary vascular prominence/dependent atelectasis without worrisome lung lesion detected.  Degenerative changes thoracic spine with mild thoracic kyphosis without focal compression deformity or bony  destructive lesion.  Slightly asymmetric breast parenchyma with scattered calcifications.  Correlation with mammography recommended.  Visualized upper abdominal structures with the prior cholecystectomy and mild fatty infiltration of the liver.  IMPRESSION: No evidence pulmonary embolus.  Heart size top normal.  Coronary artery calcifications.  No evidence of thoracic aortic dissection.  Atherosclerotic type changes of the thoracic aorta.  Ascending thoracic aorta measures up to 3.4 cm.  Calcification narrowing origin of the great vessels.  Minimal pulmonary vascular prominence/dependent atelectasis without worrisome lung lesion detected.  Degenerative changes thoracic spine with mild thoracic kyphosis without focal compression deformity or bony destructive lesion.  Slightly asymmetric breast parenchyma with scattered calcifications.  Correlation with mammography recommended.   Original Report Authenticated By: Lacy Duverney, M.D.  Microbiology: No results found for this or any previous visit (from the past 240 hour(s)).   Labs: Basic Metabolic Panel:  Recent Labs Lab 04/23/13 1040  NA 136  K 3.9  CL 98  CO2 26  GLUCOSE 155*  BUN 13  CREATININE 0.70  CALCIUM 9.4   Liver Function Tests: No results found for this basename: AST, ALT, ALKPHOS, BILITOT, PROT, ALBUMIN,  in the last 168 hours No results found for this basename: LIPASE, AMYLASE,  in the last 168 hours No results found for this basename: AMMONIA,  in the last 168 hours CBC:  Recent Labs Lab 04/23/13 1040  WBC 8.1  NEUTROABS 5.8  HGB 14.8  HCT 42.1  MCV 90.0  PLT 233   Cardiac Enzymes:  Recent Labs Lab 04/23/13 1040 04/23/13 1725  TROPONINI <0.30 <0.30   BNP: BNP (last 3 results) No results found for this basename: PROBNP,  in the last 8760 hours CBG: No results found for this basename: GLUCAP,  in the last 168 hours  Signed:  Jaevon Paras L  [facility] 04/23/2013, 6:44 PM

## 2013-04-23 NOTE — ED Provider Notes (Signed)
CSN: [id number]     Arrival date & time 04/23/13  4696 History   First MD Initiated Contact with Patient 04/23/13 [phone]     Chief Complaint  Patient presents with  . Chest Pain   (Consider location/radiation/quality/duration/timing/severity/associated sxs/prior Treatment) Patient is a 72 y.o. female presenting with chest pain. The history is provided by the patient.  Chest Pain Pain location:  Substernal area Pain quality: pressure   Pain radiates to:  R arm Pain radiates to the back: no   Pain severity:  Moderate Onset quality:  Sudden ("woke me up at 3 AM") Duration:  7 hours Timing:  Constant Progression:  Improving Chronicity:  Recurrent (similar pain Tuesday morning as well, she did not seek medical attention) Context: at rest   Context: not breathing and no movement   Relieved by:  Aspirin (she took 8 x 81 mg ASA today) Associated symptoms: cough ("in the mornings") and shortness of breath ("sometimes", none currently)   Associated symptoms: no abdominal pain, no anxiety, no back pain, no fever, no heartburn, no lower extremity edema, no nausea, no palpitations and not vomiting   Risk factors: high cholesterol and hypertension   Risk factors: no smoking (quit 4 years ago)   She also feels that she may have a urinary tract infection due some dysuria this morning.  No abd pain, n/v, diarrhea, constipation.  Past Medical History  Diagnosis Date  . Anxiety   . Diabetes mellitus     type II  . Diverticulitis     Hx of  . Hyperlipidemia   . Arthritis     OA  . Osteopenia   . GERD (gastroesophageal reflux disease)   . Hypertension   . Allergic rhinitis   . IBS (irritable bowel syndrome)   . Hematuria     HX OF MICROSCOPIC BLOOD IN URINE AND HX OF CYST ON ONE KIDNEY--BUT NO KIDNEY DISEASE   Past Surgical History  Procedure Laterality Date  . Abdominal hysterectomy    . Cholecystectomy    . Knee arthroscopy  1990    chondromalacia   . Eye surgery      BILATERAL  CATARACTS REMOVED  . Joint replacement  2000    LEFT TOTAL KNEE ARTHROPLASTY  . Total knee arthroplasty  12/05/2011    Procedure: TOTAL KNEE ARTHROPLASTY;  Surgeon: Drucilla Schmidt, MD;  Location: [facility];  Service: Orthopedics;  Laterality: Right;  . Appendectomy      per pt   Family History  Problem Relation Age of Onset  . Diabetes Father   . Diabetes Brother    History  Substance Use Topics  . Smoking status: Former Smoker -- 1.00 packs/day for 48 years    Quit date: 08/20/2009  . Smokeless tobacco: Never Used  . Alcohol Use: No   OB History   Grav Para Term Preterm Abortions TAB SAB Ect Mult Living                 Review of Systems  Constitutional: Negative for fever and chills.  Respiratory: Positive for cough ("in the mornings") and shortness of breath ("sometimes", none currently). Negative for choking.   Cardiovascular: Positive for chest pain. Negative for palpitations and leg swelling.  Gastrointestinal: Negative for heartburn, nausea, vomiting, abdominal pain and diarrhea.  Genitourinary: Negative for dysuria and frequency.  Musculoskeletal: Negative for back pain and joint swelling.  Skin: Negative for rash.  All other systems reviewed and are negative.    Allergies  Naproxen; Naproxen  sodium; Aspirin; Atorvastatin; Buspar; Metformin; Simvastatin; Sulfa antibiotics; and Sulfonamide derivatives  Home Medications   Current Outpatient Rx  Name  Route  Sig  Dispense  Refill  . albuterol (PROVENTIL HFA;VENTOLIN HFA) 108 (90 BASE) MCG/ACT inhaler   Inhalation   Inhale 2 puffs into the lungs every 6 (six) hours as needed.   1 Inhaler   11   . aspirin 81 MG tablet   Oral   Take 81 mg by mouth daily.         Marland Kitchen glipiZIDE (GLUCOTROL XL) 10 MG 24 hr tablet      TAKE 1 TABLET BY MOUTH DAILY   30 tablet   2   . glucose blood test strip      1 each 2 (two) times daily. Check blood sugar twice daily and as needed.         Marland Kitchen glucose monitoring kit  (FREESTYLE) monitoring kit      Check blood sugar once a day and as needed for diabetes 250.0          . hydrochlorothiazide (HYDRODIURIL) 25 MG tablet      TAKE 1 TABLET BY MOUTH DAILY   90 tablet   0   . Lancets (FREESTYLE) lancets      2 (two) times daily. Check blood sugar twice daily and as needed.         Marland Kitchen lisinopril (PRINIVIL,ZESTRIL) 10 MG tablet      TAKE 1 TABLET (10 MG TOTAL) BY MOUTH DAILY.   90 tablet   0   . omeprazole (PRILOSEC) 20 MG capsule   Oral   Take 1 capsule (20 mg total) by mouth daily.   90 capsule   3    BP 126/56  Temp(Src) 97.7 F (36.5 C) (Oral)  Resp 13  SpO2 96% Physical Exam  Vitals reviewed. Constitutional: She is oriented to person, place, and time. She appears well-developed and well-nourished.  HENT:  Right Ear: External ear normal.  Left Ear: External ear normal.  Mouth/Throat: No oropharyngeal exudate.  Eyes: Conjunctivae and EOM are normal. Pupils are equal, round, and reactive to light.  Neck: Normal range of motion. Neck supple.  Cardiovascular: Normal rate, regular rhythm, normal heart sounds and intact distal pulses.  Exam reveals no gallop and no friction rub.   No murmur heard. Pulmonary/Chest: Effort normal and breath sounds normal.  Abdominal: Soft. Bowel sounds are normal. She exhibits no distension. There is no tenderness. There is no rebound and no guarding.  Musculoskeletal: Normal range of motion. She exhibits no edema.  Neurological: She is alert and oriented to person, place, and time. She has normal strength. No cranial nerve deficit or sensory deficit.  Skin: Skin is warm and dry. No rash noted. She is not diaphoretic.  Psychiatric: She has a normal mood and affect.    ED Course  Procedures (including critical care time) Labs Review Labs Reviewed  BASIC METABOLIC PANEL  CBC WITH DIFFERENTIAL  TROPONIN I  D-DIMER, QUANTITATIVE  URINALYSIS, ROUTINE W REFLEX MICROSCOPIC   Imaging Review No results  found.   Date: 04/23/2013  Rate: 67  Rhythm: normal sinus rhythm  QRS Axis: normal  Intervals: normal  ST/T Wave abnormalities: normal  Conduction Disutrbances:none  Narrative Interpretation: NSR, normal EKG  Old EKG Reviewed: unchanged from EKG 11/15/09    MDM   50 y F here with substernal chest pressure that woke her from sleep that is almost gone at this point.  Similar discomfort  Tues AM.  She began having a very pleuritic chest pain when I was examinig that she described as feeling like a knife.  Mild SOB.  No fevers, cough.  Afebrile, VSS and WNLs.  Lungs clear.  RRR, no murmurs.  No edema.  EKG normal.    Diff Dx: ACS, PE, PNA, MSK, Pleuritis, GERD  Trop, labs, d dimer, CXR.  No pain currently.  11:35 PM D dimer elevated.  Will obtain CT PE  1:30 PM Given risk fx (HEART score 4 - T2DM, HTN, HLD, obese, former longtime smoker), no recent provocative testing and concerning "chest pressure" story, will admit for further evaluation.  Clinical Impression: 1. Chest pain     Disposition: Admit  Condition: Fair   I have discussed the results, Dx and Tx plan. They understand and agree with plan for admission.  Exam unchanged at admission.   Pt seen in conjunction with Dr. Manus Gunning.  Reine Just. Beverely Pace, MD Emergency Medicine PGY-III [phone]   Oleh Genin, MD 04/23/13 [phone]

## 2013-04-24 NOTE — Progress Notes (Signed)
Utilization review completed.  

## 2013-04-24 NOTE — ED Provider Notes (Signed)
I saw and evaluated the patient, reviewed the resident's note and I agree with the findings and plan. If applicable, I agree with the resident's interpretation of the EKG.  If applicable, I was present for critical portions of any procedures performed.  Diabetic with R sided chest pain, similar episode of substernal discomfort two days ago. Associated with SOB.  EKG nsr.  Check troponin and D-dimer.  Glynn Octave, MD 04/24/13 1451

## 2013-04-30 NOTE — Telephone Encounter (Signed)
OV note from Dr. Luciana Axe in inbox, no particular abx mentioned  In note

## 2013-05-01 NOTE — Telephone Encounter (Signed)
Left voicemail requesting pt to call office 

## 2013-05-01 NOTE — Telephone Encounter (Signed)
If she is in the hospital- sounds like she would need to follow up with me before the surgery- I do not see any current hospital notes for today or yesterday At follow up we can discuss the abx  Please ask what she is in the hospital for?  Also I could not tell from the eye clinic note what antibiotics were for?

## 2013-05-04 NOTE — Telephone Encounter (Signed)
Pt was in hospital on 04/23/13 for chest pain, pt scheduled a f/u with you for tomorrow to discuss eye surgery and hospital stay because eye surgery scheduled for Wednesday 05/06/13

## 2013-05-04 NOTE — Telephone Encounter (Signed)
I am aware- will see her then

## 2013-05-05 ENCOUNTER — Encounter: Payer: Self-pay | Admitting: Family Medicine

## 2013-05-05 ENCOUNTER — Ambulatory Visit (INDEPENDENT_AMBULATORY_CARE_PROVIDER_SITE_OTHER): Payer: Medicare Other | Admitting: Family Medicine

## 2013-05-05 VITALS — BP 124/64 | HR 85 | Temp 98.2°F | Ht 61.0 in | Wt 188.2 lb

## 2013-05-05 DIAGNOSIS — H43829 Vitreomacular adhesion, unspecified eye: Secondary | ICD-10-CM | POA: Insufficient documentation

## 2013-05-05 DIAGNOSIS — R0789 Other chest pain: Secondary | ICD-10-CM

## 2013-05-05 DIAGNOSIS — J0100 Acute maxillary sinusitis, unspecified: Secondary | ICD-10-CM

## 2013-05-05 DIAGNOSIS — H43821 Vitreomacular adhesion, right eye: Secondary | ICD-10-CM

## 2013-05-05 MED ORDER — AMOXICILLIN-POT CLAVULANATE 875-125 MG PO TABS: 1.0000 | ORAL_TABLET | Freq: Two times a day (BID) | ORAL | Status: DC

## 2013-05-05 NOTE — Assessment & Plan Note (Signed)
For surgery Pt needed premed for knee replacements/ prophylaxis Given 10 d of augmentin for sinusitis as well

## 2013-05-05 NOTE — Assessment & Plan Note (Signed)
Will tx with 10 d of augmentin Disc symptomatic care - see instructions on AVS Update if not starting to improve in a week or if worsening

## 2013-05-05 NOTE — Patient Instructions (Addendum)
Start augmentin for sinus infection and also pre surgical  Good luck with surgery and recovery  I am reassured by your hospital work up  Get your flu shot once you have recovered and sinus infection is ok

## 2013-05-05 NOTE — Assessment & Plan Note (Signed)
This appears to be resolved  Rev hosp records and studies in detail today  Nl exam  Will continue to monitor

## 2013-05-05 NOTE — Progress Notes (Signed)
Subjective:    Patient ID: Erin Beck, female    DOB: 02/01/1941, 72 y.o.   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 Vitreomacular adhesion 05/05/2013  . Acute maxillary sinusitis 04/23/2013  . Musculoskeletal chest pain 04/23/2013  . Unspecified asthma(493.90) 04/23/2013  . Encounter for Medicare annual wellness exam 03/03/2013  . Personal history of colonic polyps 03/03/2013  . Post-menopausal 10/17/2012  . Injury of left toe 09/25/2012  . Postoperative anemia 12/26/2011  . Cough productive of purulent sputum 12/12/2011  . Obesity 10/31/2011  . Other screening mammogram 10/31/2011  . IBS (irritable bowel syndrome) 08/10/2011  . GERD 07/31/2010  . ALLERGIC RHINITIS 12/05/2007  . HYPERTENSION, BENIGN ESSENTIAL 02/05/2007  . TOBACCO USE, QUIT 02/05/2007  . DIABETES MELLITUS, TYPE II 01/31/2007  . HYPERLIPIDEMIA 01/31/2007  . ANXIETY 01/31/2007  . OSTEOARTHRITIS 01/31/2007  . DIVERTICULITIS, HX OF 01/31/2007   Past Medical History  Diagnosis Date  . Anxiety   . Diabetes mellitus     type II  . Diverticulitis     Hx of  . Hyperlipidemia   . Arthritis      OA  . Osteopenia   . GERD (gastroesophageal reflux disease)   . Hypertension   . Allergic rhinitis   . IBS (irritable bowel syndrome)   . Hematuria     HX OF MICROSCOPIC BLOOD IN URINE AND HX OF CYST ON ONE KIDNEY--BUT NO KIDNEY DISEASE  . Asthma    Past Surgical History  Procedure Laterality Date  . Abdominal hysterectomy    . Cholecystectomy    . Knee arthroscopy  1990    chondromalacia   . Joint replacement  2000    LEFT TOTAL KNEE ARTHROPLASTY  . Total knee arthroplasty  12/05/2011    Procedure: TOTAL KNEE ARTHROPLASTY;  Surgeon: Drucilla Schmidt, MD;  Location: [facility];  Service: Orthopedics;  Laterality: Right;  . Appendectomy      per pt  . Eye surgery      BILATERAL CATARACTS REMOVED   History  Substance Use Topics  . Smoking status: Former Smoker -- 1.00 packs/day for 48 years    Quit date: 08/20/2009  . Smokeless tobacco: Never Used  . Alcohol Use: No   Family History  Problem Relation Age of Onset  . Diabetes Father   . Diabetes Brother    Allergies  Allergen Reactions  . Naproxen Rash    Trouble breathing  . Naproxen Sodium Rash    Trouble breathing  .  Aspirin     REACTION: burns stomach PT STATES SHE DOES TOLERATE THE 81 MG ASPIRIN DAILY  . Atorvastatin     REACTION: muscle pain  . Buspar [Buspirone Hcl]     Multiple side eff  . Metformin     REACTION: Diarrhea  . Simvastatin     REACTION: GI side eff and swelling  . Sulfa Antibiotics Other (See Comments)    Severe joint pain  . Sulfonamide Derivatives     REACTION: reaction not known   Current Outpatient Prescriptions on File Prior to Visit  Medication Sig Dispense Refill  . acetaminophen (TYLENOL) 325 MG tablet Take 2 tablets (650 mg total) by mouth every 6 (six) hours as needed.      Marland Kitchen albuterol (PROVENTIL HFA;VENTOLIN HFA) 108 (90 BASE) MCG/ACT inhaler Inhale 2 puffs into the lungs every 6 (six) hours as needed.  1 Inhaler  11  . aspirin 81 MG tablet Take 81 mg by mouth daily.      .  fluticasone (FLONASE) 50 MCG/ACT nasal spray Place 2 sprays into the nose daily.  16 g  0  . glipiZIDE (GLUCOTROL XL) 10 MG 24 hr tablet Take 10 mg by mouth daily.      Marland Kitchen glucose blood test strip 1 each 2 (two) times daily. Check blood sugar twice daily and as needed.      . hydrochlorothiazide (HYDRODIURIL) 25 MG tablet Take 25 mg by mouth daily.      Marland Kitchen HYDROcodone-acetaminophen (NORCO/VICODIN) 5-325 MG per tablet Take 1 tablet by mouth every 6 (six) hours as needed for pain.  20 tablet  0  . Lancets (FREESTYLE) lancets 2 (two) times daily. Check blood sugar twice daily and as needed.      Marland Kitchen lisinopril (PRINIVIL,ZESTRIL) 10 MG tablet Take 10 mg by mouth daily.      . methocarbamol (ROBAXIN) 500 MG tablet Take 1 tablet (500 mg total) by mouth every 8 (eight) hours as needed (pain/spasm).  10 tablet  0  . omeprazole (PRILOSEC) 20 MG capsule Take 1 capsule (20 mg total) by mouth daily.  90 capsule  3   No current facility-administered medications on file prior to visit.     Review of Systems Review of Systems  Constitutional: Negative for fever, appetite change, fatigue and unexpected weight change.  Eyes: Negative for pain and pos for vision disturbance in R eye  ENT pos for sinus pain and purulent nasal discharge/ neg for ST Respiratory: Negative for cough and shortness of breath.   Cardiovascular: Negative for cp or palpitations (cp is resolved)   Gastrointestinal: Negative for nausea, diarrhea and constipation.  Genitourinary: Negative for urgency and frequency.  Skin: Negative for pallor or rash   Neurological: Negative for weakness, light-headedness, numbness and headaches.  Hematological: Negative for adenopathy. Does not bruise/bleed easily.  Psychiatric/Behavioral: Negative for dysphoric mood. The patient is not nervous/anxious.         Objective:   Physical Exam  Constitutional: She appears well-developed and well-nourished. No distress.  obese and well appearing   HENT:   Head: Normocephalic and atraumatic.  Right Ear: External ear normal.  Left Ear: External ear normal.  Mouth/Throat: Oropharynx is clear and moist. No oropharyngeal exudate.  Nares are injected and congested  Mild maxillary sinus tenderness   Eyes: Conjunctivae and EOM are normal. Pupils are equal, round, and reactive to light. Right eye exhibits no discharge. Left eye exhibits no discharge. No scleral icterus.  Neck: Normal range of motion.  Neck supple. No JVD present. Carotid bruit is not present. No thyromegaly present.  Cardiovascular: Normal rate, regular rhythm, normal heart sounds and intact distal pulses.  Exam reveals no gallop.   Pulmonary/Chest: Effort normal and breath sounds normal. No respiratory distress. She has no wheezes. She has no rales. She exhibits no tenderness.  No crackles   Abdominal: Soft. Bowel sounds are normal. She exhibits no distension, no abdominal bruit and no mass. There is no tenderness.  Musculoskeletal: She exhibits no edema.  Poor rom knees   Lymphadenopathy:    She has no cervical adenopathy.  Neurological: She is alert. She has normal reflexes. No cranial nerve deficit. She exhibits normal muscle tone. Coordination normal.  Skin: Skin is warm and dry. No rash noted. No erythema. No pallor.  Psychiatric: She has a normal mood and affect.          Assessment & Plan:

## 2013-05-13 ENCOUNTER — Encounter: Payer: Medicare Other | Admitting: Internal Medicine

## 2013-05-20 ENCOUNTER — Encounter: Payer: Self-pay | Admitting: Internal Medicine

## 2013-05-27 ENCOUNTER — Other Ambulatory Visit: Payer: Medicare Other

## 2013-06-05 ENCOUNTER — Ambulatory Visit: Payer: Medicare Other | Admitting: Family Medicine

## 2013-06-19 ENCOUNTER — Other Ambulatory Visit: Payer: Self-pay | Admitting: Family Medicine

## 2013-06-19 MED ORDER — GLIPIZIDE ER 10 MG PO TB24: 10.0000 mg | ORAL_TABLET | Freq: Every day | ORAL | Status: DC

## 2013-07-02 ENCOUNTER — Other Ambulatory Visit: Payer: Medicare Other

## 2013-07-04 ENCOUNTER — Other Ambulatory Visit: Payer: Self-pay | Admitting: Family Medicine

## 2013-07-06 ENCOUNTER — Other Ambulatory Visit (INDEPENDENT_AMBULATORY_CARE_PROVIDER_SITE_OTHER): Payer: Medicare Other

## 2013-07-06 DIAGNOSIS — E119 Type 2 diabetes mellitus without complications: Secondary | ICD-10-CM

## 2013-07-10 ENCOUNTER — Encounter: Payer: Self-pay | Admitting: Family Medicine

## 2013-07-10 ENCOUNTER — Ambulatory Visit (INDEPENDENT_AMBULATORY_CARE_PROVIDER_SITE_OTHER): Payer: Medicare Other | Admitting: Family Medicine

## 2013-07-10 VITALS — BP 122/76 | HR 77 | Temp 97.9°F | Ht 61.0 in | Wt 186.8 lb

## 2013-07-10 DIAGNOSIS — Z23 Encounter for immunization: Secondary | ICD-10-CM

## 2013-07-10 DIAGNOSIS — I10 Essential (primary) hypertension: Secondary | ICD-10-CM

## 2013-07-10 DIAGNOSIS — E119 Type 2 diabetes mellitus without complications: Secondary | ICD-10-CM

## 2013-07-10 NOTE — Progress Notes (Signed)
Subjective:    Patient ID: Erin Beck, female    DOB: [birth year], 72 y.o.   MRN: [id number]  HPI Here for f/u of DM   Having a lot of problems with chronic pain -back and knees Cannot do a lot - frustrating  Her orthopedic Dr Retired -does not want surgery  Deg disk and RA  Also had to be sedentary for 6 wk after eye surgery- but it was worth it - vision is much much better  Dr Luciana Axe did surgery    Wt is down 2 lb   Diabetes Home sugar results - she occ checks it / sugar goes up when she gets stressed , no longer having the low sugars  DM diet -watching it carefully / using diabetic recepie and has not had time to do DM education  Schedule is a big problem (caring for her husband  Exercise -not able to do much Symptoms A1C last  Lab Results  Component Value Date   HGBA1C 7.5* 07/06/2013  down from 7.7   No problems with medications - she bottoms out sugars with metformin  Renal protection ace  Last eye exam 6/14    Patient Active Problem List   Diagnosis Date Noted  . Vitreomacular adhesion 05/05/2013  . Acute maxillary sinusitis 04/23/2013  . Musculoskeletal chest pain 04/23/2013  . Unspecified asthma(493.90) 04/23/2013  . Encounter for Medicare annual wellness exam 03/03/2013  . Personal history of colonic polyps 03/03/2013  . Post-menopausal 10/17/2012  . Injury of left toe 09/25/2012  . Postoperative anemia 12/26/2011  . Cough productive of purulent sputum 12/12/2011  . Obesity 10/31/2011  . Other screening mammogram 10/31/2011  . IBS (irritable bowel syndrome) 08/10/2011  . GERD 07/31/2010  . ALLERGIC RHINITIS 12/05/2007  . HYPERTENSION, BENIGN ESSENTIAL 02/05/2007  . TOBACCO USE, QUIT 02/05/2007  . DIABETES MELLITUS, TYPE II 01/31/2007  . HYPERLIPIDEMIA 01/31/2007  . ANXIETY 01/31/2007  . OSTEOARTHRITIS 01/31/2007  . DIVERTICULITIS, HX OF 01/31/2007   Past Medical History  Diagnosis Date  . Anxiety   . Diabetes mellitus     type II  .  Diverticulitis     Hx of  . Hyperlipidemia   . Arthritis     OA  . Osteopenia   . GERD (gastroesophageal reflux disease)   . Hypertension   . Allergic rhinitis   . IBS (irritable bowel syndrome)   . Hematuria     HX OF MICROSCOPIC BLOOD IN URINE AND HX OF CYST ON ONE KIDNEY--BUT NO KIDNEY DISEASE  . Asthma    Past Surgical History  Procedure Laterality Date  . Abdominal hysterectomy    . Cholecystectomy    . Knee arthroscopy  1990    chondromalacia   . Joint replacement  2000    LEFT TOTAL KNEE ARTHROPLASTY  . Total knee arthroplasty  12/05/2011    Procedure: TOTAL KNEE ARTHROPLASTY;  Surgeon: Drucilla Schmidt, MD;  Location: [facility];  Service: Orthopedics;  Laterality: Right;  . Appendectomy      per pt  . Eye surgery      BILATERAL CATARACTS REMOVED   History  Substance Use Topics  . Smoking status: Former Smoker -- 1.00 packs/day for 48 years    Quit date: 08/20/2009  . Smokeless tobacco: Never Used  . Alcohol Use: No   Family History  Problem Relation Age of Onset  . Diabetes Father   . Diabetes Brother    Allergies  Allergen Reactions  . Naproxen Rash  Trouble breathing  . Naproxen Sodium Rash    Trouble breathing  . Aspirin     REACTION: burns stomach PT STATES SHE DOES TOLERATE THE 81 MG ASPIRIN DAILY  . Atorvastatin     REACTION: muscle pain  . Buspar [Buspirone Hcl]     Multiple side eff  . Metformin     REACTION: Diarrhea  . Simvastatin     REACTION: GI side eff and swelling  . Sulfa Antibiotics Other (See Comments)    Severe joint pain  . Sulfonamide Derivatives     REACTION: reaction not known   Current Outpatient Prescriptions on File Prior to Visit  Medication Sig Dispense Refill  . acetaminophen (TYLENOL) 325 MG tablet Take 2 tablets (650 mg total) by mouth every 6 (six) hours as needed.      Marland Kitchen albuterol (PROVENTIL HFA;VENTOLIN HFA) 108 (90 BASE) MCG/ACT inhaler Inhale 2 puffs into the lungs every 6 (six) hours as needed.  1 Inhaler   11  . aspirin 81 MG tablet Take 81 mg by mouth daily.      Marland Kitchen glipiZIDE (GLUCOTROL XL) 10 MG 24 hr tablet Take 1 tablet (10 mg total) by mouth daily.  90 tablet  0  . glucose blood test strip 1 each 2 (two) times daily. Check blood sugar twice daily and as needed.      . hydrochlorothiazide (HYDRODIURIL) 25 MG tablet TAKE 1 TABLET BY MOUTH DAILY  90 tablet  0  . Lancets (FREESTYLE) lancets 2 (two) times daily. Check blood sugar twice daily and as needed.      Marland Kitchen lisinopril (PRINIVIL,ZESTRIL) 10 MG tablet TABLET BY MOUTH EVERY DAY  90 tablet  0  . omeprazole (PRILOSEC) 20 MG capsule Take 1 capsule (20 mg total) by mouth daily.  90 capsule  3   No current facility-administered medications on file prior to visit.    Review of Systems Review of Systems  Constitutional: Negative for fever, appetite change, fatigue and unexpected weight change.  Eyes: Negative for pain and visual disturbance. vision is much improved Respiratory: Negative for cough and shortness of breath.   Cardiovascular: Negative for cp or palpitations    Gastrointestinal: Negative for nausea, diarrhea and constipation.  Genitourinary: Negative for urgency and frequency.  Skin: Negative for pallor or rash   MSK pos for chronic pain  Neurological: Negative for weakness, light-headedness, numbness and headaches.  Hematological: Negative for adenopathy. Does not bruise/bleed easily.  Psychiatric/Behavioral: Negative for dysphoric mood. The patient is not nervous/anxious.         Objective:   Physical Exam  Constitutional: She appears well-developed and well-nourished. No distress.  obese and well appearing   HENT:  Head: Normocephalic and atraumatic.  Mouth/Throat: Oropharynx is clear and moist.  Eyes: Conjunctivae and EOM are normal. Pupils are equal, round, and reactive to light. No scleral icterus.  Neck: Normal range of motion. Neck supple. No JVD present. Carotid bruit is not present. No thyromegaly present.   Cardiovascular: Normal rate, regular rhythm, normal heart sounds and intact distal pulses.  Exam reveals no gallop.   Pulmonary/Chest: Effort normal and breath sounds normal. No respiratory distress. She has no wheezes. She has no rales.  Abdominal: Soft. Bowel sounds are normal.  Musculoskeletal: She exhibits no edema and no tenderness.  Lymphadenopathy:    She has no cervical adenopathy.  Neurological: She is alert. She has normal reflexes. No cranial nerve deficit. She exhibits normal muscle tone. Coordination normal.  Skin: Skin is warm  and dry. No rash noted. No erythema. No pallor.  Psychiatric: She has a normal mood and affect.          Assessment & Plan:

## 2013-07-10 NOTE — Progress Notes (Signed)
Pre-visit discussion using our clinic review tool. No additional management support is needed unless otherwise documented below in the visit note.  

## 2013-07-10 NOTE — Patient Instructions (Signed)
Keep working hard on weight loss and diabetic diet  Consider a chair exercise program (on line or video ) - or water exercise  When you are ready to investigate diabetic teaching - go to [facility] pharmacy and talk to them about it Follow up in 3 mo with labs prior

## 2013-07-12 NOTE — Assessment & Plan Note (Signed)
Lab Results  Component Value Date   HGBA1C 7.5* 07/06/2013   slt imp  Pt does not want to add med  Will look into dm teaching  Not active for 6 wk due to eye surg - promises this will imp  Adv wt loss and work on diet  Lab and f/u 3 mo

## 2013-07-12 NOTE — Assessment & Plan Note (Signed)
BP: 122/76 mmHg  bp in fair control at this time  No changes needed Disc lifstyle change with low sodium diet and exercise   Lab rev 

## 2013-09-07 ENCOUNTER — Other Ambulatory Visit: Payer: Self-pay

## 2013-09-07 MED ORDER — HYDROCHLOROTHIAZIDE 25 MG PO TABS: ORAL_TABLET | ORAL | Status: DC

## 2013-09-07 MED ORDER — OMEPRAZOLE 20 MG PO CPDR: 20.0000 mg | DELAYED_RELEASE_CAPSULE | Freq: Every day | ORAL | Status: DC

## 2013-09-07 MED ORDER — LISINOPRIL 10 MG PO TABS: ORAL_TABLET | ORAL | Status: DC

## 2013-09-07 MED ORDER — GLIPIZIDE ER 10 MG PO TB24
10.0000 mg | ORAL_TABLET | Freq: Every day | ORAL | Status: DC
Start: 2013-09-07 — End: 2013-09-08

## 2013-09-07 NOTE — Telephone Encounter (Signed)
Pt left note requesting prescriptions for HCTZ,Glipizide,Lisinopril and Omeprazole. Pt has never used [facility] before and pt's husband said needs insurance information sent with 1st ordering of meds. Advised Mr Tat when rx ready for pick up pt will receive call and pt can send in prescriptions with needed insurance information. Mr Crichlow voiced understanding. Call when rxs ready for pick up.

## 2013-09-07 NOTE — Telephone Encounter (Signed)
Px printed for pick up in IN box  

## 2013-09-08 MED ORDER — GLIPIZIDE ER 10 MG PO TB24: 10.0000 mg | ORAL_TABLET | Freq: Every day | ORAL | Status: DC

## 2013-09-08 MED ORDER — LISINOPRIL 10 MG PO TABS: ORAL_TABLET | ORAL | Status: DC

## 2013-09-08 MED ORDER — OMEPRAZOLE 20 MG PO CPDR: 20.0000 mg | DELAYED_RELEASE_CAPSULE | Freq: Every day | ORAL | Status: DC

## 2013-09-08 MED ORDER — HYDROCHLOROTHIAZIDE 25 MG PO TABS: ORAL_TABLET | ORAL | Status: DC

## 2013-09-08 NOTE — Telephone Encounter (Signed)
Rxs sent to [facility] and pt notified

## 2013-09-08 NOTE — Telephone Encounter (Signed)
Pt left v/m that pt has set up acct with [facility] and request med refills sent to [facility] rather than pt picking up prescriptions. Pt request cb if needed.

## 2013-09-28 ENCOUNTER — Encounter: Payer: Self-pay | Admitting: Family Medicine

## 2013-09-28 ENCOUNTER — Ambulatory Visit (INDEPENDENT_AMBULATORY_CARE_PROVIDER_SITE_OTHER): Payer: Medicare HMO | Admitting: Family Medicine

## 2013-09-28 VITALS — BP 116/70 | HR 84 | Temp 98.9°F | Ht 61.0 in | Wt 187.2 lb

## 2013-09-28 DIAGNOSIS — J069 Acute upper respiratory infection, unspecified: Secondary | ICD-10-CM

## 2013-09-28 DIAGNOSIS — R059 Cough, unspecified: Secondary | ICD-10-CM

## 2013-09-28 DIAGNOSIS — B9789 Other viral agents as the cause of diseases classified elsewhere: Principal | ICD-10-CM

## 2013-09-28 DIAGNOSIS — R05 Cough: Secondary | ICD-10-CM

## 2013-09-28 LAB — POCT INFLUENZA A/B
INFLUENZA A, POC: NEGATIVE
Influenza B, POC: NEGATIVE

## 2013-09-28 MED ORDER — AZITHROMYCIN 250 MG PO TABS: ORAL_TABLET | ORAL | Status: DC

## 2013-09-28 NOTE — Progress Notes (Signed)
Subjective:    Patient ID: Erin Beck, female    DOB: 1941/03/27, 73 y.o.   MRN: [id number]  HPI Here for congestion and fever  Results for orders placed in visit on 09/28/13  POCT INFLUENZA A/B      Result Value Range   Influenza A, POC Negative     Influenza B, POC Negative      Woke up Thursday am - ST and achey and congested nasal  Got mucinex over the counter  She did not have much fever -- low grade / 99 or less Has chills   A lot of coughing  Prod of yellow sputum No wheeze or sob  Nasal d/c is clear and thick   Patient Active Problem List   Diagnosis Date Noted  . Vitreomacular adhesion 05/05/2013  . Musculoskeletal chest pain 04/23/2013  . Unspecified asthma(493.90) 04/23/2013  . Encounter for Medicare annual wellness exam 03/03/2013  . Personal history of colonic polyps 03/03/2013  . Post-menopausal 10/17/2012  . Injury of left toe 09/25/2012  . Postoperative anemia 12/26/2011  . Cough productive of purulent sputum 12/12/2011  . Obesity 10/31/2011  . Other screening mammogram 10/31/2011  . IBS (irritable bowel syndrome) 08/10/2011  . GERD 07/31/2010  . ALLERGIC RHINITIS 12/05/2007  . HYPERTENSION, BENIGN ESSENTIAL 02/05/2007  . TOBACCO USE, QUIT 02/05/2007  . DIABETES MELLITUS, TYPE II 01/31/2007  . HYPERLIPIDEMIA 01/31/2007  . ANXIETY 01/31/2007  . OSTEOARTHRITIS 01/31/2007  . DIVERTICULITIS, HX OF 01/31/2007   Past Medical History  Diagnosis Date  . Anxiety   . Diabetes mellitus     type II  . Diverticulitis     Hx of  . Hyperlipidemia   . Arthritis     OA  . Osteopenia   . GERD (gastroesophageal reflux disease)   . Hypertension   . Allergic rhinitis   . IBS (irritable bowel syndrome)   . Hematuria     HX OF MICROSCOPIC BLOOD IN URINE AND HX OF CYST ON ONE KIDNEY--BUT NO KIDNEY DISEASE  . Asthma    Past Surgical History  Procedure Laterality Date  . Abdominal hysterectomy    . Cholecystectomy    . Knee arthroscopy  1990   chondromalacia   . Joint replacement  2000    LEFT TOTAL KNEE ARTHROPLASTY  . Total knee arthroplasty  12/05/2011    Procedure: TOTAL KNEE ARTHROPLASTY;  Surgeon: Magnus Sinning, MD;  Location: [facility];  Service: Orthopedics;  Laterality: Right;  . Appendectomy      per pt  . Eye surgery      BILATERAL CATARACTS REMOVED   History  Substance Use Topics  . Smoking status: Former Smoker -- 1.00 packs/day for 48 years    Quit date: 08/20/2009  . Smokeless tobacco: Never Used  . Alcohol Use: No   Family History  Problem Relation Age of Onset  . Diabetes Father   . Diabetes Brother    Allergies  Allergen Reactions  . Naproxen Rash    Trouble breathing  . Naproxen Sodium Rash    Trouble breathing  . Aspirin     REACTION: burns stomach PT STATES SHE DOES TOLERATE THE 81 MG ASPIRIN DAILY  . Atorvastatin     REACTION: muscle pain  . Buspar [Buspirone Hcl]     Multiple side eff  . Metformin     REACTION: Diarrhea  . Simvastatin     REACTION: GI side eff and swelling  . Sulfa Antibiotics Other (See Comments)  Severe joint pain  . Sulfonamide Derivatives     REACTION: reaction not known   Current Outpatient Prescriptions on File Prior to Visit  Medication Sig Dispense Refill  . acetaminophen (TYLENOL) 325 MG tablet Take 2 tablets (650 mg total) by mouth every 6 (six) hours as needed.      Marland Kitchen albuterol (PROVENTIL HFA;VENTOLIN HFA) 108 (90 BASE) MCG/ACT inhaler Inhale 2 puffs into the lungs every 6 (six) hours as needed.  1 Inhaler  11  . aspirin 81 MG tablet Take 81 mg by mouth daily.      Marland Kitchen glipiZIDE (GLUCOTROL XL) 10 MG 24 hr tablet Take 1 tablet (10 mg total) by mouth daily.  90 tablet  3  . glucose blood test strip 1 each 2 (two) times daily. Check blood sugar twice daily and as needed.      . hydrochlorothiazide (HYDRODIURIL) 25 MG tablet TAKE 1 TABLET BY MOUTH DAILY  90 tablet  3  . Lancets (FREESTYLE) lancets 2 (two) times daily. Check blood sugar twice daily and as  needed.      Marland Kitchen lisinopril (PRINIVIL,ZESTRIL) 10 MG tablet TABLET BY MOUTH EVERY DAY  90 tablet  3  . omeprazole (PRILOSEC) 20 MG capsule Take 1 capsule (20 mg total) by mouth daily.  90 capsule  3   No current facility-administered medications on file prior to visit.      Review of Systems Review of Systems  Constitutional: Negative for  appetite change,  and unexpected weight change.  ENt pos for cong and rhinorrhea and ST Eyes: Negative for pain and visual disturbance.  Respiratory: Negative for wheeze  and shortness of breath.   Cardiovascular: Negative for cp or palpitations    Gastrointestinal: Negative for nausea, diarrhea and constipation.  Genitourinary: Negative for urgency and frequency.  Skin: Negative for pallor or rash   Neurological: Negative for weakness, light-headedness, numbness and headaches.  Hematological: Negative for adenopathy. Does not bruise/bleed easily.  Psychiatric/Behavioral: Negative for dysphoric mood. The patient is not nervous/anxious.         Objective:   Physical Exam  Constitutional: She appears well-developed and well-nourished. No distress.  obese and well appearing   HENT:  Head: Normocephalic and atraumatic.  Right Ear: External ear normal.  Left Ear: External ear normal.  Mouth/Throat: Oropharynx is clear and moist. No oropharyngeal exudate.  Nares are injected and congested  No sinus tenderness  Eyes: Conjunctivae and EOM are normal. Pupils are equal, round, and reactive to light. Right eye exhibits no discharge. Left eye exhibits no discharge. No scleral icterus.  Neck: Normal range of motion. Neck supple. No JVD present. Carotid bruit is not present. No thyromegaly present.  Cardiovascular: Normal rate and regular rhythm.   Pulmonary/Chest: Effort normal and breath sounds normal. No respiratory distress. She has no wheezes. She has no rales.  Lymphadenopathy:    She has no cervical adenopathy.  Neurological: She is alert.  Skin:  Skin is warm and dry.  Psychiatric: She has a normal mood and affect.          Assessment & Plan:

## 2013-09-28 NOTE — Progress Notes (Signed)
Pre-visit discussion using our clinic review tool. No additional management support is needed unless otherwise documented below in the visit note.  

## 2013-09-28 NOTE — Assessment & Plan Note (Signed)
Disc symptomatic care - see instructions on AVS  Update if not starting to improve in a week or if worsening  If worse in the next several days- will fill px for zpak and alert [place] -esp if worse cough or return of fever

## 2013-09-28 NOTE — Patient Instructions (Signed)
Drink lots of fluids and take mucinex if it helps  Breathe steam and also nasal saline spray for congestion  Get extra rest  Tylenol if you feel feverish   If you worsen or do not begin to improve in several days- fill px for zithromax and keep me posted  Update if not starting to improve in a week or if worsening    Upper Respiratory Infection, Adult An upper respiratory infection (URI) is also sometimes known as the common cold. The upper respiratory tract includes the nose, sinuses, throat, trachea, and bronchi. Bronchi are the airways leading to the lungs. Most people improve within 1 week, but symptoms can last up to 2 weeks. A residual cough may last even longer.  CAUSES Many different viruses can infect the tissues lining the upper respiratory tract. The tissues become irritated and inflamed and often become very moist. Mucus production is also common. A cold is contagious. You can easily spread the virus to others by oral contact. This includes kissing, sharing a glass, coughing, or sneezing. Touching your mouth or nose and then touching a surface, which is then touched by another person, can also spread the virus. SYMPTOMS  Symptoms typically develop 1 to 3 days after you come in contact with a cold virus. Symptoms vary from person to person. They may include:  Runny nose.  Sneezing.  Nasal congestion.  Sinus irritation.  Sore throat.  Loss of voice (laryngitis).  Cough.  Fatigue.  Muscle aches.  Loss of appetite.  Headache.  Low-grade fever. DIAGNOSIS  You might diagnose your own cold based on familiar symptoms, since most people get a cold 2 to 3 times a year. Your caregiver can confirm this based on your exam. Most importantly, your caregiver can check that your symptoms are not due to another disease such as strep throat, sinusitis, pneumonia, asthma, or epiglottitis. Blood tests, throat tests, and X-rays are not necessary to diagnose a common cold, but they may  sometimes be helpful in excluding other more serious diseases. Your caregiver will decide if any further tests are required. RISKS AND COMPLICATIONS  You may be at risk for a more severe case of the common cold if you smoke cigarettes, have chronic heart disease (such as heart failure) or lung disease (such as asthma), or if you have a weakened immune system. The very young and very old are also at risk for more serious infections. Bacterial sinusitis, middle ear infections, and bacterial pneumonia can complicate the common cold. The common cold can worsen asthma and chronic obstructive pulmonary disease (COPD). Sometimes, these complications can require emergency medical care and may be life-threatening. PREVENTION  The best way to protect against getting a cold is to practice good hygiene. Avoid oral or hand contact with people with cold symptoms. Wash your hands often if contact occurs. There is no clear evidence that vitamin C, vitamin E, echinacea, or exercise reduces the chance of developing a cold. However, it is always recommended to get plenty of rest and practice good nutrition. TREATMENT  Treatment is directed at relieving symptoms. There is no cure. Antibiotics are not effective, because the infection is caused by a virus, not by bacteria. Treatment may include:  Increased fluid intake. Sports drinks offer valuable electrolytes, sugars, and fluids.  Breathing heated mist or steam (vaporizer or shower).  Eating chicken soup or other clear broths, and maintaining good nutrition.  Getting plenty of rest.  Using gargles or lozenges for comfort.  Controlling fevers with ibuprofen  or acetaminophen as directed by your caregiver.  Increasing usage of your inhaler if you have asthma. Zinc gel and zinc lozenges, taken in the first 24 hours of the common cold, can shorten the duration and lessen the severity of symptoms. Pain medicines may help with fever, muscle aches, and throat pain. A  variety of non-prescription medicines are available to treat congestion and runny nose. Your caregiver can make recommendations and may suggest nasal or lung inhalers for other symptoms.  HOME CARE INSTRUCTIONS   Only take over-the-counter or prescription medicines for pain, discomfort, or fever as directed by your caregiver.  Use a warm mist humidifier or inhale steam from a shower to increase air moisture. This may keep secretions moist and make it easier to breathe.  Drink enough water and fluids to keep your urine clear or pale yellow.  Rest as needed.  Return to work when your temperature has returned to normal or as your caregiver advises. You may need to stay home longer to avoid infecting others. You can also use a face mask and careful hand washing to prevent spread of the virus. SEEK MEDICAL CARE IF:   After the first few days, you feel you are getting worse rather than better.  You need your caregiver's advice about medicines to control symptoms.  You develop chills, worsening shortness of breath, or brown or red sputum. These may be signs of pneumonia.  You develop yellow or brown nasal discharge or pain in the face, especially when you bend forward. These may be signs of sinusitis.  You develop a fever, swollen neck glands, pain with swallowing, or white areas in the back of your throat. These may be signs of strep throat. SEEK IMMEDIATE MEDICAL CARE IF:   You have a fever.  You develop severe or persistent headache, ear pain, sinus pain, or chest pain.  You develop wheezing, a prolonged cough, cough up blood, or have a change in your usual mucus (if you have chronic lung disease).  You develop sore muscles or a stiff neck. Document Released: 01/30/2001 Document Revised: 10/29/2011 Document Reviewed: 12/08/2010 [facility] Patient Information 2014 [place], [place].

## 2013-10-05 ENCOUNTER — Other Ambulatory Visit (INDEPENDENT_AMBULATORY_CARE_PROVIDER_SITE_OTHER): Payer: Medicare HMO

## 2013-10-05 ENCOUNTER — Telehealth: Payer: Self-pay

## 2013-10-05 DIAGNOSIS — I10 Essential (primary) hypertension: Secondary | ICD-10-CM

## 2013-10-05 DIAGNOSIS — R7989 Other specified abnormal findings of blood chemistry: Secondary | ICD-10-CM

## 2013-10-05 DIAGNOSIS — E119 Type 2 diabetes mellitus without complications: Secondary | ICD-10-CM

## 2013-10-05 LAB — COMPREHENSIVE METABOLIC PANEL
ALT: 31 U/L (ref 0–35)
AST: 21 U/L (ref 0–37)
Albumin: 4 g/dL (ref 3.5–5.2)
Alkaline Phosphatase: 59 U/L (ref 39–117)
BUN: 11 mg/dL (ref 6–23)
CALCIUM: 9.2 mg/dL (ref 8.4–10.5)
CHLORIDE: 101 meq/L (ref 96–112)
CO2: 28 meq/L (ref 19–32)
CREATININE: 0.8 mg/dL (ref 0.4–1.2)
GFR: 74.85 mL/min (ref >60.00)
Glucose, Bld: 142 mg/dL — ABNORMAL HIGH (ref 70–99)
Potassium: 3.5 mEq/L (ref 3.5–5.1)
Sodium: 139 mEq/L (ref 135–145)
Total Bilirubin: 0.5 mg/dL (ref 0.3–1.2)
Total Protein: 7.2 g/dL (ref 6.0–8.3)

## 2013-10-05 LAB — LIPID PANEL
CHOL/HDL RATIO: 7
CHOLESTEROL: 248 mg/dL — AB (ref 0–200)
HDL: 35 mg/dL — ABNORMAL LOW (ref >39.00)
TRIGLYCERIDES: 321 mg/dL — AB (ref 0.0–149.0)
VLDL: 64.2 mg/dL — ABNORMAL HIGH (ref 0.0–40.0)

## 2013-10-05 LAB — LDL CHOLESTEROL, DIRECT: LDL DIRECT: 161.8 mg/dL

## 2013-10-05 LAB — HEMOGLOBIN A1C: Hgb A1c MFr Bld: 7.3 % — ABNORMAL HIGH (ref 4.6–6.5)

## 2013-10-05 MED ORDER — AMOXICILLIN-POT CLAVULANATE 875-125 MG PO TABS: 1.0000 | ORAL_TABLET | Freq: Two times a day (BID) | ORAL | Status: DC

## 2013-10-05 NOTE — Telephone Encounter (Signed)
Pt left note; still having congestion in nose and throat; pt has not taken temp but thinks has fever;wakes up sweating on and on all night; pt has finished medicine and does not want to get worse.Please advise. [facility].

## 2013-10-05 NOTE — Telephone Encounter (Signed)
Pt advised Rx sent to pharmacy and to f/u if no improvement. Pt did take temp and it was 98.2

## 2013-10-05 NOTE — Telephone Encounter (Signed)
have her take her temp so we know if she has a fever  I will send in augmentin for poss sinus infx F/u if not imp or if worse

## 2013-10-12 ENCOUNTER — Ambulatory Visit: Payer: Medicare Other | Admitting: Family Medicine

## 2013-10-13 ENCOUNTER — Ambulatory Visit: Payer: Commercial Managed Care - HMO | Admitting: Family Medicine

## 2013-10-19 ENCOUNTER — Encounter: Payer: Self-pay | Admitting: Family Medicine

## 2013-10-19 ENCOUNTER — Ambulatory Visit (INDEPENDENT_AMBULATORY_CARE_PROVIDER_SITE_OTHER): Payer: Medicare HMO | Admitting: Family Medicine

## 2013-10-19 VITALS — BP 122/76 | HR 87 | Temp 98.2°F | Ht 61.0 in | Wt 190.0 lb

## 2013-10-19 DIAGNOSIS — E785 Hyperlipidemia, unspecified: Secondary | ICD-10-CM

## 2013-10-19 DIAGNOSIS — I10 Essential (primary) hypertension: Secondary | ICD-10-CM

## 2013-10-19 DIAGNOSIS — E119 Type 2 diabetes mellitus without complications: Secondary | ICD-10-CM

## 2013-10-19 NOTE — Assessment & Plan Note (Signed)
Lab Results  Component Value Date   HGBA1C 7.3* 10/05/2013   This is not at goal  Pt thinks she can improve this with better exercise now that she is feeling better  In light of mobility issues-disc chair exercise programs Lab in 3 mo and f/u  Rev low glycemic diet

## 2013-10-19 NOTE — Assessment & Plan Note (Signed)
BP: 122/76 mmHg   bp in fair control at this time  No changes needed Disc lifstyle change with low sodium diet and exercise  Labs reveiewed

## 2013-10-19 NOTE — Progress Notes (Signed)
Subjective:    Patient ID: Erin Beck, female    DOB: [birth year], 73 y.o.   MRN: [id number]  HPI Here for f/u of chronic health problems  Feeling pretty well overall   Wt is up 3 lb with bmi of 35 Feels like she is retaining fluid  Needs to drink water    Eye surgery- she is doing well with that - saw Dr Zadie Rhine a week ago and they signed off  Has done very well  Her vision is much better - can go without glasses some of the time Will see Dr Herbert Deaner in [place]   Her orthopedic Dr is retiring (Dr Collier Salina)   bp is stable today  No cp or palpitations or headaches or edema  No side effects to medicines  BP Readings from Last 3 Encounters:  10/19/13 122/76  09/28/13 116/70  07/10/13 122/76       Chemistry      Component Value Date/Time   NA 139 10/05/2013 0951   K 3.5 10/05/2013 0951   CL 101 10/05/2013 0951   CO2 28 10/05/2013 0951   BUN 11 10/05/2013 0951   CREATININE 0.8 10/05/2013 0951      Component Value Date/Time   CALCIUM 9.2 10/05/2013 0951   ALKPHOS 59 10/05/2013 0951   AST 21 10/05/2013 0951   ALT 31 10/05/2013 0951   BILITOT 0.5 10/05/2013 0951       Diabetes Home sugar results - every now and then she has a sugar in the low side of normal- knows what symptoms to watch for - and this is rare  DM diet - she is watching carbs much more carefully - more grilled meats and veg  Trying to cut down on portions  Exercise -unable - knee and back pain  Symptoms A1C last  Lab Results  Component Value Date   HGBA1C 7.3* 10/05/2013   This is down from 7.5  No problems with medications (cannot take metformin)  Renal protection- lisinopril  Last eye exam - recent- just had eye surgery  Hyperlipidemia  Cannot tolerate statins   Lab Results  Component Value Date   CHOL 248* 10/05/2013   CHOL 245* 02/24/2013   CHOL 238* 10/24/2011   Lab Results  Component Value Date   HDL 35.00* 10/05/2013   HDL 39.80 02/24/2013   HDL 44.10 10/24/2011   Lab Results  Component Value  Date   LDLCALC  Value: 132        Total Cholesterol/HDL:CHD Risk Coronary Heart Disease Risk Table                     Men   Women  1/2 Average Risk   3.4   3.3  Average Risk       5.0   4.4  2 X Average Risk   9.6   7.1  3 X Average Risk  23.4   11.0        Use the calculated Patient Ratio above and the CHD Risk Table to determine the patient's CHD Risk.        ATP III CLASSIFICATION (LDL):  <100     mg/dL   Optimal  100-129  mg/dL   Near or Above                    Optimal  130-159  mg/dL   Borderline  160-189  mg/dL   High  >190     mg/dL  Very High* 11/16/2009   Lab Results  Component Value Date   TRIG 321.0* 10/05/2013   TRIG 265.0* 02/24/2013   TRIG 274.0* 10/24/2011   Lab Results  Component Value Date   CHOLHDL 7 10/05/2013   CHOLHDL 6 02/24/2013   CHOLHDL 5 10/24/2011   Lab Results  Component Value Date   LDLDIRECT 161.8 10/05/2013   LDLDIRECT 175.6 02/24/2013   LDLDIRECT 156.2 10/24/2011     LDL is a bit improved  She tries to eat a low fat diet   Patient Active Problem List   Diagnosis Date Noted  . Viral URI with cough 09/28/2013  . Vitreomacular adhesion 05/05/2013  . Musculoskeletal chest pain 04/23/2013  . Unspecified asthma(493.90) 04/23/2013  . Encounter for Medicare annual wellness exam 03/03/2013  . Personal history of colonic polyps 03/03/2013  . Post-menopausal 10/17/2012  . Injury of left toe 09/25/2012  . Postoperative anemia 12/26/2011  . Obesity 10/31/2011  . Other screening mammogram 10/31/2011  . IBS (irritable bowel syndrome) 08/10/2011  . GERD 07/31/2010  . ALLERGIC RHINITIS 12/05/2007  . HYPERTENSION, BENIGN ESSENTIAL 02/05/2007  . TOBACCO USE, QUIT 02/05/2007  . DIABETES MELLITUS, TYPE II 01/31/2007  . HYPERLIPIDEMIA 01/31/2007  . ANXIETY 01/31/2007  . OSTEOARTHRITIS 01/31/2007  . DIVERTICULITIS, HX OF 01/31/2007   Past Medical History  Diagnosis Date  . Anxiety   . Diabetes mellitus     type II  . Diverticulitis     Hx of  . Hyperlipidemia   .  Arthritis     OA  . Osteopenia   . GERD (gastroesophageal reflux disease)   . Hypertension   . Allergic rhinitis   . IBS (irritable bowel syndrome)   . Hematuria     HX OF MICROSCOPIC BLOOD IN URINE AND HX OF CYST ON ONE KIDNEY--BUT NO KIDNEY DISEASE  . Asthma    Past Surgical History  Procedure Laterality Date  . Abdominal hysterectomy    . Cholecystectomy    . Knee arthroscopy  1990    chondromalacia   . Joint replacement  2000    LEFT TOTAL KNEE ARTHROPLASTY  . Total knee arthroplasty  12/05/2011    Procedure: TOTAL KNEE ARTHROPLASTY;  Surgeon: Magnus Sinning, MD;  Location: [facility];  Service: Orthopedics;  Laterality: Right;  . Appendectomy      per pt  . Eye surgery      BILATERAL CATARACTS REMOVED   History  Substance Use Topics  . Smoking status: Former Smoker -- 1.00 packs/day for 48 years    Quit date: 08/20/2009  . Smokeless tobacco: Never Used  . Alcohol Use: No   Family History  Problem Relation Age of Onset  . Diabetes Father   . Diabetes Brother    Allergies  Allergen Reactions  . Naproxen Rash    Trouble breathing  . Naproxen Sodium Rash    Trouble breathing  . Aspirin     REACTION: burns stomach PT STATES SHE DOES TOLERATE THE 81 MG ASPIRIN DAILY  . Atorvastatin     REACTION: muscle pain  . Buspar [Buspirone Hcl]     Multiple side eff  . Metformin     REACTION: Diarrhea  . Simvastatin     REACTION: GI side eff and swelling  . Sulfa Antibiotics Other (See Comments)    Severe joint pain  . Sulfonamide Derivatives     REACTION: reaction not known   Current Outpatient Prescriptions on File Prior to Visit  Medication Sig Dispense Refill  .  acetaminophen (TYLENOL) 325 MG tablet Take 2 tablets (650 mg total) by mouth every 6 (six) hours as needed.      Marland Kitchen albuterol (PROVENTIL HFA;VENTOLIN HFA) 108 (90 BASE) MCG/ACT inhaler Inhale 2 puffs into the lungs every 6 (six) hours as needed.  1 Inhaler  11  . amoxicillin-clavulanate (AUGMENTIN) 875-125  MG per tablet Take 1 tablet by mouth 2 (two) times daily.  14 tablet  0  . aspirin 81 MG tablet Take 81 mg by mouth daily.      Marland Kitchen glipiZIDE (GLUCOTROL XL) 10 MG 24 hr tablet Take 1 tablet (10 mg total) by mouth daily.  90 tablet  3  . glucose blood test strip 1 each 2 (two) times daily. Check blood sugar twice daily and as needed.      . hydrochlorothiazide (HYDRODIURIL) 25 MG tablet TAKE 1 TABLET BY MOUTH DAILY  90 tablet  3  . Lancets (FREESTYLE) lancets 2 (two) times daily. Check blood sugar twice daily and as needed.      Marland Kitchen lisinopril (PRINIVIL,ZESTRIL) 10 MG tablet TABLET BY MOUTH EVERY DAY  90 tablet  3  . omeprazole (PRILOSEC) 20 MG capsule Take 1 capsule (20 mg total) by mouth daily.  90 capsule  3   No current facility-administered medications on file prior to visit.      Review of Systems Review of Systems  Constitutional: Negative for fever, appetite change, fatigue and unexpected weight change.  Eyes: Negative for pain and visual disturbance. (vision is much improved) Respiratory: Negative for cough and shortness of breath.   Cardiovascular: Negative for cp or palpitations    Gastrointestinal: Negative for nausea, diarrhea and constipation.  Genitourinary: Negative for urgency and frequency.  Skin: Negative for pallor or rash   Neurological: Negative for weakness, light-headedness, numbness and headaches.  Hematological: Negative for adenopathy. Does not bruise/bleed easily.  Psychiatric/Behavioral: Negative for dysphoric mood. The patient is not nervous/anxious.         Objective:   Physical Exam  Constitutional: She appears well-developed and well-nourished. No distress.  obese and well appearing   HENT:  Head: Normocephalic and atraumatic.  Nose: Nose normal.  Eyes: Conjunctivae and EOM are normal. Pupils are equal, round, and reactive to light. Right eye exhibits no discharge. Left eye exhibits no discharge. No scleral icterus.  Neck: Normal range of motion. Neck  supple. No JVD present. Carotid bruit is not present. No thyromegaly present.  Cardiovascular: Normal rate, regular rhythm, normal heart sounds and intact distal pulses.  Exam reveals no gallop.   Pulmonary/Chest: Effort normal and breath sounds normal. No respiratory distress. She has no wheezes. She has no rales.  Abdominal: Soft. Bowel sounds are normal. She exhibits no distension and no mass. There is no tenderness.  Musculoskeletal: She exhibits no edema and no tenderness.  Lymphadenopathy:    She has no cervical adenopathy.  Neurological: She is alert. She has normal reflexes. No cranial nerve deficit. She exhibits normal muscle tone. Coordination normal.  Skin: Skin is warm and dry. No rash noted. No erythema. No pallor.  Psychiatric: She has a normal mood and affect.          Assessment & Plan:

## 2013-10-19 NOTE — Assessment & Plan Note (Signed)
Disc goals for lipids and reasons to control them Rev labs with pt Rev low sat fat diet in detail Not at goal- pt is intol to statins and other meds  Will need to tx opt with diet

## 2013-10-19 NOTE — Progress Notes (Signed)
Pre visit review using our clinic review tool, if applicable. No additional management support is needed unless otherwise documented below in the visit note. 

## 2013-10-19 NOTE — Patient Instructions (Signed)
We need to get your blood sugar down more  Work on weight loss - take in less calories and be as active as you can  Try to look up chair exercise routines -that may be a good way for you to exercise  Eat a low fat and low sugar diet  Follow up in about 3 months with labs prior

## 2013-10-20 ENCOUNTER — Telehealth: Payer: Self-pay | Admitting: Family Medicine

## 2013-10-20 NOTE — Telephone Encounter (Signed)
Relevant patient education mailed to patient.  

## 2013-10-21 ENCOUNTER — Telehealth: Payer: Self-pay

## 2013-10-21 NOTE — Telephone Encounter (Signed)
Relevant patient education mailed to patient.  

## 2013-10-28 ENCOUNTER — Ambulatory Visit (INDEPENDENT_AMBULATORY_CARE_PROVIDER_SITE_OTHER): Payer: Medicare HMO | Admitting: Family Medicine

## 2013-10-28 ENCOUNTER — Encounter: Payer: Self-pay | Admitting: Family Medicine

## 2013-10-28 VITALS — BP 116/64 | HR 81 | Temp 98.3°F | Ht 61.0 in | Wt 191.0 lb

## 2013-10-28 DIAGNOSIS — R300 Dysuria: Secondary | ICD-10-CM

## 2013-10-28 DIAGNOSIS — N390 Urinary tract infection, site not specified: Secondary | ICD-10-CM

## 2013-10-28 LAB — POCT URINALYSIS DIPSTICK
Bilirubin, UA: NEGATIVE
Glucose, UA: NEGATIVE
Ketones, UA: NEGATIVE
Nitrite, UA: NEGATIVE
PROTEIN UA: NEGATIVE
Spec Grav, UA: 1.01
UROBILINOGEN UA: 0.2
pH, UA: 7

## 2013-10-28 MED ORDER — CIPROFLOXACIN HCL 250 MG PO TABS: 250.0000 mg | ORAL_TABLET | Freq: Two times a day (BID) | ORAL | Status: DC

## 2013-10-28 NOTE — Progress Notes (Signed)
Pre visit review using our clinic review tool, if applicable. No additional management support is needed unless otherwise documented below in the visit note. 

## 2013-10-28 NOTE — Patient Instructions (Signed)
Take the cipro as directed for urinary infection Drink lots of water and avoid other beverages  Update if not starting to improve in several days  or if worsening  (especially if fever or back pain) We will contact you with urine culture result

## 2013-10-28 NOTE — Progress Notes (Signed)
   Subjective:    Patient ID: Erin Beck, female    DOB: 11-12-1940, 73 y.o.   MRN: [id number]  HPI Here for urinary symptoms   Started right after she was here last  Burning to urinate  Also took azo - did not help much  Getting up to urinate often  Feels like she has to urinate frequently and urgently She had a low grade fever as well  No blood in urine  Having bladder pain and pressure     Results for orders placed in visit on 10/28/13  POCT URINALYSIS DIPSTICK      Result Value Ref Range   Color, UA yellow     Clarity, UA hazy     Glucose, UA neg.     Bilirubin, UA neg.     Ketones, UA neg.     Spec Grav, UA 1.010     Blood, UA trace     pH, UA 7.0     Protein, UA neg.     Urobilinogen, UA 0.2     Nitrite, UA neg.     Leukocytes, UA Trace        Review of Systems Review of Systems  Constitutional: Negative for fever, appetite change, fatigue and unexpected weight change.  Eyes: Negative for pain and visual disturbance.  Respiratory: Negative for cough and shortness of breath.   Cardiovascular: Negative for cp or palpitations    Gastrointestinal: Negative for nausea, diarrhea and constipation.  Genitourinary: pos for urgency and frequency. pos for dysuria neg for hematuria Skin: Negative for pallor or rash   Neurological: Negative for weakness, light-headedness, numbness and headaches.  Hematological: Negative for adenopathy. Does not bruise/bleed easily.  Psychiatric/Behavioral: Negative for dysphoric mood. The patient is not nervous/anxious.         Objective:   Physical Exam  Constitutional: She appears well-developed and well-nourished. No distress.  obese and well appearing   HENT:  Head: Normocephalic and atraumatic.  Eyes: Conjunctivae and EOM are normal. Pupils are equal, round, and reactive to light.  Neck: Normal range of motion. Neck supple.  Cardiovascular: Normal rate and regular rhythm.   Pulmonary/Chest: Effort normal and breath  sounds normal.  Abdominal: Soft. Bowel sounds are normal. She exhibits no distension and no mass. There is no hepatosplenomegaly. There is tenderness in the suprapubic area. There is no rebound, no guarding and no CVA tenderness.  Musculoskeletal: She exhibits no edema.  Lymphadenopathy:    She has no cervical adenopathy.  Neurological: She is alert.  Skin: Skin is warm and dry. No rash noted. No pallor.  Psychiatric: She has a normal mood and affect.          Assessment & Plan:

## 2013-10-29 LAB — POCT UA - MICROSCOPIC ONLY
Casts, Ur, LPF, POC: 0
YEAST UA: 0

## 2013-10-29 LAB — URINE CULTURE
COLONY COUNT: NO GROWTH
Organism ID, Bacteria: NO GROWTH

## 2013-10-29 NOTE — Assessment & Plan Note (Signed)
cx urine  Cover with cipro as directed  Disc symptomatic care - see instructions on AVS  Update if not starting to improve in a week or if worsening   Will call with cx

## 2014-01-26 ENCOUNTER — Other Ambulatory Visit: Payer: Self-pay | Admitting: Family Medicine

## 2014-01-29 ENCOUNTER — Other Ambulatory Visit: Payer: Medicare HMO

## 2014-02-05 ENCOUNTER — Ambulatory Visit: Payer: Medicare HMO | Admitting: Family Medicine

## 2014-03-08 ENCOUNTER — Telehealth: Payer: Self-pay | Admitting: Family Medicine

## 2014-03-08 DIAGNOSIS — E119 Type 2 diabetes mellitus without complications: Secondary | ICD-10-CM

## 2014-03-08 DIAGNOSIS — I10 Essential (primary) hypertension: Secondary | ICD-10-CM

## 2014-03-08 NOTE — Telephone Encounter (Signed)
Message copied by Abner Greenspan on Mon Mar 08, 2014  8:54 PM ------      Message from: Ellamae Sia      Created: Thu Mar 04, 2014  2:58 PM      Regarding: Lab orders for Tuesday, 7.21.15       Patient is scheduled for CPX labs, please order future labs, Thanks , Terri            Plus diabetic bundle, LDL ------

## 2014-03-09 ENCOUNTER — Other Ambulatory Visit (INDEPENDENT_AMBULATORY_CARE_PROVIDER_SITE_OTHER): Payer: Commercial Managed Care - HMO

## 2014-03-09 DIAGNOSIS — E119 Type 2 diabetes mellitus without complications: Secondary | ICD-10-CM

## 2014-03-09 DIAGNOSIS — I10 Essential (primary) hypertension: Secondary | ICD-10-CM

## 2014-03-09 LAB — COMPREHENSIVE METABOLIC PANEL
ALK PHOS: 61 U/L (ref 39–117)
ALT: 32 U/L (ref 0–35)
AST: 26 U/L (ref 0–37)
Albumin: 4.1 g/dL (ref 3.5–5.2)
BILIRUBIN TOTAL: 0.7 mg/dL (ref 0.2–1.2)
BUN: 18 mg/dL (ref 6–23)
CO2: 26 mEq/L (ref 19–32)
CREATININE: 0.9 mg/dL (ref 0.4–1.2)
Calcium: 9.2 mg/dL (ref 8.4–10.5)
Chloride: 97 mEq/L (ref 96–112)
GFR: 67.86 mL/min (ref >60.00)
Glucose, Bld: 164 mg/dL — ABNORMAL HIGH (ref 70–99)
Potassium: 3.8 mEq/L (ref 3.5–5.1)
Sodium: 136 mEq/L (ref 135–145)
Total Protein: 7.3 g/dL (ref 6.0–8.3)

## 2014-03-09 LAB — CBC WITH DIFFERENTIAL/PLATELET
BASOS PCT: 0.4 % (ref 0.0–3.0)
Basophils Absolute: 0 10*3/uL (ref 0.0–0.1)
Eosinophils Absolute: 0.4 10*3/uL (ref 0.0–0.7)
Eosinophils Relative: 4 % (ref 0.0–5.0)
HCT: 43.5 % (ref 36.0–46.0)
HEMOGLOBIN: 14.9 g/dL (ref 12.0–15.0)
Lymphocytes Relative: 17.8 % (ref 12.0–46.0)
Lymphs Abs: 1.7 10*3/uL (ref 0.7–4.0)
MCHC: 34.2 g/dL (ref 30.0–36.0)
MCV: 90.8 fl (ref 78.0–100.0)
MONO ABS: 0.6 10*3/uL (ref 0.1–1.0)
Monocytes Relative: 6.8 % (ref 3.0–12.0)
NEUTROS ABS: 6.7 10*3/uL (ref 1.4–7.7)
Neutrophils Relative %: 71 % (ref 43.0–77.0)
Platelets: 205 10*3/uL (ref 150.0–400.0)
RBC: 4.79 Mil/uL (ref 3.87–5.11)
RDW: 13.2 % (ref 11.5–15.5)
WBC: 9.4 10*3/uL (ref 4.0–10.5)

## 2014-03-09 LAB — TSH: TSH: 2.22 u[IU]/mL (ref 0.35–4.50)

## 2014-03-09 LAB — LIPID PANEL
CHOL/HDL RATIO: 6
Cholesterol: 227 mg/dL — ABNORMAL HIGH (ref 0–200)
HDL: 40.1 mg/dL (ref >39.00)
LDL Cholesterol: 127 mg/dL — ABNORMAL HIGH (ref 0–99)
NonHDL: 186.9
TRIGLYCERIDES: 300 mg/dL — AB (ref 0.0–149.0)
VLDL: 60 mg/dL — ABNORMAL HIGH (ref 0.0–40.0)

## 2014-03-09 LAB — HEMOGLOBIN A1C: Hgb A1c MFr Bld: 8 % — ABNORMAL HIGH (ref 4.6–6.5)

## 2014-03-12 ENCOUNTER — Encounter: Payer: Self-pay | Admitting: Family Medicine

## 2014-03-12 ENCOUNTER — Ambulatory Visit (INDEPENDENT_AMBULATORY_CARE_PROVIDER_SITE_OTHER): Payer: Commercial Managed Care - HMO | Admitting: Family Medicine

## 2014-03-12 ENCOUNTER — Telehealth: Payer: Self-pay

## 2014-03-12 VITALS — BP 128/68 | HR 91 | Temp 98.0°F | Ht 61.0 in | Wt 191.0 lb

## 2014-03-12 DIAGNOSIS — E119 Type 2 diabetes mellitus without complications: Secondary | ICD-10-CM

## 2014-03-12 DIAGNOSIS — M25511 Pain in right shoulder: Secondary | ICD-10-CM

## 2014-03-12 DIAGNOSIS — M25519 Pain in unspecified shoulder: Secondary | ICD-10-CM

## 2014-03-12 DIAGNOSIS — E785 Hyperlipidemia, unspecified: Secondary | ICD-10-CM

## 2014-03-12 DIAGNOSIS — I10 Essential (primary) hypertension: Secondary | ICD-10-CM

## 2014-03-12 DIAGNOSIS — E669 Obesity, unspecified: Secondary | ICD-10-CM

## 2014-03-12 MED ORDER — METFORMIN HCL 500 MG PO TABS: 500.0000 mg | ORAL_TABLET | Freq: Two times a day (BID) | ORAL | Status: DC

## 2014-03-12 NOTE — Assessment & Plan Note (Signed)
Lab Results  Component Value Date   HGBA1C 8.0* 03/09/2014    This is up  She wants to try low dose metformin again - 500 bid If side eff will have to consider inj med dep on ins cov Long disc re: diet and exercise

## 2014-03-12 NOTE — Patient Instructions (Addendum)
Try metformin again 500 mg twice daily  If side effects stop it and call  Then we will have to see what your insurance covers Try to stick to a diabetic diet  Exercise-work up to 30 minutes a day - try videos for indoors  Stop at check out for orthopedic referral   Follow up in 3 months with labs prior   Call back with specific brand name of glucose meter and equipment you need

## 2014-03-12 NOTE — Progress Notes (Signed)
Subjective:    Patient ID: Erin Beck, female    DOB: 01-14-1941, 73 y.o.   MRN: [id number]  HPI Here for f/u of chronic medical problems  Doing fairly overall  She had a fall when she was gardening - tumbled off a chair  Her elbow and shoulder bother her  Her hip problem from the past -is better now - remarkably (that was a problem after knee surgery) Low back pain comes and goes   She desires a ref to Dr Sharol Given for orthopedics   Also taking vitamin D and she says muscle pain in general is better   bp is stable today  No cp or palpitations or headaches or edema  No side effects to medicines  BP Readings from Last 3 Encounters:  03/12/14 128/68  10/28/13 116/64  10/19/13 122/76     Wt is stable bmi 36   Diabetes Home sugar results  DM diet -she is working on that - "it is hard" Exercise - slowly improved after knee repl- getting ready to start walking (when it cools off) Symptoms-none  A1C last  Lab Results  Component Value Date   HGBA1C 8.0* 03/09/2014  was 7.3   No problems with medications  Glipizide (wants to try metformin one more time now that she is on reflux med) Renal protection is on ace  Last eye exam - next mo with Dr Herbert Deaner    Chemistry      Component Value Date/Time   NA 136 03/09/2014 0838   K 3.8 03/09/2014 0838   CL 97 03/09/2014 0838   CO2 26 03/09/2014 0838   BUN 18 03/09/2014 0838   CREATININE 0.9 03/09/2014 0838      Component Value Date/Time   CALCIUM 9.2 03/09/2014 0838   ALKPHOS 61 03/09/2014 0838   AST 26 03/09/2014 0838   ALT 32 03/09/2014 0838   BILITOT 0.7 03/09/2014 0838         Hyperlipidemia Lab Results  Component Value Date   CHOL 227* 03/09/2014   CHOL 248* 10/05/2013   CHOL 245* 02/24/2013   Lab Results  Component Value Date   HDL 40.10 03/09/2014   HDL 35.00* 10/05/2013   HDL 39.80 02/24/2013   Lab Results  Component Value Date   LDLCALC 127* 03/09/2014   LDLCALC  Value: 132        Total Cholesterol/HDL:CHD Risk  Coronary Heart Disease Risk Table                     Men   Women  1/2 Average Risk   3.4   3.3  Average Risk       5.0   4.4  2 X Average Risk   9.6   7.1  3 X Average Risk  23.4   11.0        Use the calculated Patient Ratio above and the CHD Risk Table to determine the patient's CHD Risk.        ATP III CLASSIFICATION (LDL):  <100     mg/dL   Optimal  100-129  mg/dL   Near or Above                    Optimal  130-159  mg/dL   Borderline  160-189  mg/dL   High  >190     mg/dL   Very High* 11/16/2009   Lab Results  Component Value Date   TRIG 300.0* 03/09/2014  TRIG 321.0* 10/05/2013   TRIG 265.0* 02/24/2013   Lab Results  Component Value Date   CHOLHDL 6 03/09/2014   CHOLHDL 7 10/05/2013   CHOLHDL 6 02/24/2013   Lab Results  Component Value Date   LDLDIRECT 161.8 10/05/2013   LDLDIRECT 175.6 02/24/2013   LDLDIRECT 156.2 10/24/2011    Did improve a little - intol of all meds so far    Patient Active Problem List   Diagnosis Date Noted  . UTI (urinary tract infection) 10/28/2013  . Vitreomacular adhesion 05/05/2013  . Musculoskeletal chest pain 04/23/2013  . Unspecified asthma(493.90) 04/23/2013  . Encounter for Medicare annual wellness exam 03/03/2013  . Personal history of colonic polyps 03/03/2013  . Post-menopausal 10/17/2012  . Injury of left toe 09/25/2012  . Postoperative anemia 12/26/2011  . Obesity 10/31/2011  . Other screening mammogram 10/31/2011  . IBS (irritable bowel syndrome) 08/10/2011  . GERD 07/31/2010  . ALLERGIC RHINITIS 12/05/2007  . HYPERTENSION, BENIGN ESSENTIAL 02/05/2007  . TOBACCO USE, QUIT 02/05/2007  . DIABETES MELLITUS, TYPE II 01/31/2007  . HYPERLIPIDEMIA 01/31/2007  . ANXIETY 01/31/2007  . OSTEOARTHRITIS 01/31/2007  . DIVERTICULITIS, HX OF 01/31/2007   Past Medical History  Diagnosis Date  . Anxiety   . Diabetes mellitus     type II  . Diverticulitis     Hx of  . Hyperlipidemia   . Arthritis     OA  . Osteopenia   . GERD (gastroesophageal  reflux disease)   . Hypertension   . Allergic rhinitis   . IBS (irritable bowel syndrome)   . Hematuria     HX OF MICROSCOPIC BLOOD IN URINE AND HX OF CYST ON ONE KIDNEY--BUT NO KIDNEY DISEASE  . Asthma    Past Surgical History  Procedure Laterality Date  . Abdominal hysterectomy    . Cholecystectomy    . Knee arthroscopy  1990    chondromalacia   . Joint replacement  2000    LEFT TOTAL KNEE ARTHROPLASTY  . Total knee arthroplasty  12/05/2011    Procedure: TOTAL KNEE ARTHROPLASTY;  Surgeon: Magnus Sinning, MD;  Location: [facility];  Service: Orthopedics;  Laterality: Right;  . Appendectomy      per pt  . Eye surgery      BILATERAL CATARACTS REMOVED   History  Substance Use Topics  . Smoking status: Former Smoker -- 1.00 packs/day for 48 years    Quit date: 08/20/2009  . Smokeless tobacco: Never Used  . Alcohol Use: No   Family History  Problem Relation Age of Onset  . Diabetes Father   . Diabetes Brother    Allergies  Allergen Reactions  . Naproxen Rash    Trouble breathing  . Naproxen Sodium Rash    Trouble breathing  . Aspirin     REACTION: burns stomach PT STATES SHE DOES TOLERATE THE 81 MG ASPIRIN DAILY  . Atorvastatin     REACTION: muscle pain  . Buspar [Buspirone Hcl]     Multiple side eff  . Metformin     REACTION: Diarrhea  . Simvastatin     REACTION: GI side eff and swelling  . Sulfa Antibiotics Other (See Comments)    Severe joint pain  . Sulfonamide Derivatives     REACTION: reaction not known   Current Outpatient Prescriptions on File Prior to Visit  Medication Sig Dispense Refill  . acetaminophen (TYLENOL) 325 MG tablet Take 2 tablets (650 mg total) by mouth every 6 (six) hours as needed.      Marland Kitchen  aspirin 81 MG tablet Take 81 mg by mouth daily.      . ciprofloxacin (CIPRO) 250 MG tablet Take 1 tablet (250 mg total) by mouth 2 (two) times daily.  10 tablet  0  . glipiZIDE (GLUCOTROL XL) 10 MG 24 hr tablet Take 1 tablet (10 mg total) by mouth  daily.  90 tablet  3  . glucose blood test strip 1 each 2 (two) times daily. Check blood sugar twice daily and as needed.      . hydrochlorothiazide (HYDRODIURIL) 25 MG tablet TAKE 1 TABLET BY MOUTH DAILY  90 tablet  3  . Lancets (FREESTYLE) lancets 2 (two) times daily. Check blood sugar twice daily and as needed.      Marland Kitchen lisinopril (PRINIVIL,ZESTRIL) 10 MG tablet TABLET BY MOUTH EVERY DAY  90 tablet  3  . omeprazole (PRILOSEC) 20 MG capsule Take 1 capsule (20 mg total) by mouth daily.  90 capsule  3  . PROAIR HFA 108 (90 BASE) MCG/ACT inhaler INHALE 2 PUFFS INTO THE LUNGS EVERY 6 (SIX) HOURS AS NEEDED.  8.5 each  2   No current facility-administered medications on file prior to visit.      Review of Systems Review of Systems  Constitutional: Negative for fever, appetite change, fatigue and unexpected weight change.  Eyes: Negative for pain and visual disturbance.  Respiratory: Negative for cough and shortness of breath.   Cardiovascular: Negative for cp or palpitations    Gastrointestinal: Negative for nausea, diarrhea and constipation.  Genitourinary: Negative for urgency and frequency.  Skin: Negative for pallor or rash   MSK pos for R shoulder pain  Neurological: Negative for weakness, light-headedness, numbness and headaches.  Hematological: Negative for adenopathy. Does not bruise/bleed easily.  Psychiatric/Behavioral: Negative for dysphoric mood. The patient is not nervous/anxious.         Objective:   Physical Exam  Constitutional: She appears well-developed and well-nourished. No distress.  obese and well appearing   HENT:  Head: Normocephalic and atraumatic.  Mouth/Throat: Oropharynx is clear and moist.  Eyes: Conjunctivae and EOM are normal. Pupils are equal, round, and reactive to light. No scleral icterus.  Neck: Normal range of motion. Neck supple. No JVD present. No thyromegaly present.  Cardiovascular: Normal rate, regular rhythm, normal heart sounds and intact  distal pulses.  Exam reveals no gallop.   Pulmonary/Chest: Effort normal and breath sounds normal. No respiratory distress. She has no wheezes. She has no rales.  Abdominal: Soft. Bowel sounds are normal. She exhibits no distension and no mass. There is no tenderness.  Musculoskeletal: She exhibits no edema and no tenderness.  Poor rom R shoulder  Lymphadenopathy:    She has no cervical adenopathy.  Neurological: She is alert. She has normal reflexes. No cranial nerve deficit. She exhibits normal muscle tone. Coordination normal.  Skin: Skin is warm and dry. No rash noted. No erythema. No pallor.  Psychiatric: She has a normal mood and affect.          Assessment & Plan:   Problem List Items Addressed This Visit     Cardiovascular and Mediastinum   HYPERTENSION, BENIGN ESSENTIAL - Primary      bp in fair control at this time  BP Readings from Last 1 Encounters:  03/12/14 128/68   No changes needed Disc lifstyle change with low sodium diet and exercise        Other   Diabetes type 2, uncontrolled      Lab Results  Component Value Date   HGBA1C 8.0* 03/09/2014    This is up  She wants to try low dose metformin again - 500 bid If side eff will have to consider inj med dep on ins cov Long disc re: diet and exercise     Relevant Medications      metFORMIN (GLUCOPHAGE) tablet   HYPERLIPIDEMIA     Disc goals for lipids and reasons to control them Rev labs with pt Rev low sat fat diet in detail slt imp , not at goal and intol to all med      Obesity     Discussed how this problem influences overall health and the risks it imposes  Reviewed plan for weight loss with lower calorie diet (via better food choices and also portion control or program like weight watchers) and exercise building up to or more than 30 minutes 5 days per week including some aerobic activity       Relevant Medications      metFORMIN (GLUCOPHAGE) tablet   Right shoulder pain     She req ortho  consult -will ref     Relevant Orders      Ambulatory referral to Orthopedic Surgery

## 2014-03-12 NOTE — Telephone Encounter (Signed)
Pt wanted Rx sent to [facility] Mail order pharmacy. Rx sent and pt notified

## 2014-03-12 NOTE — Assessment & Plan Note (Signed)
Discussed how this problem influences overall health and the risks it imposes  Reviewed plan for weight loss with lower calorie diet (via better food choices and also portion control or program like weight watchers) and exercise building up to or more than 30 minutes 5 days per week including some aerobic activity    

## 2014-03-12 NOTE — Assessment & Plan Note (Signed)
Disc goals for lipids and reasons to control them Rev labs with pt Rev low sat fat diet in detail slt imp , not at goal and intol to all med

## 2014-03-12 NOTE — Telephone Encounter (Signed)
Pt left v/m; pt was seen on 03/12/14 and pt needs accu chek nano meter; also request diabetic test strips and lancets as discussed at OV.

## 2014-03-12 NOTE — Telephone Encounter (Signed)
Left voicemail requesting pt to call office back. I need to know if pt wants to pick up orders or have me fax them to a pharmacy and if so what pharmacy she needs me to send it too

## 2014-03-12 NOTE — Assessment & Plan Note (Signed)
She req ortho consult -will ref

## 2014-03-12 NOTE — Telephone Encounter (Signed)
Done and in IN box 

## 2014-03-12 NOTE — Assessment & Plan Note (Signed)
bp in fair control at this time  BP Readings from Last 1 Encounters:  03/12/14 128/68   No changes needed Disc lifstyle change with low sodium diet and exercise

## 2014-03-12 NOTE — Progress Notes (Signed)
Pre visit review using our clinic review tool, if applicable. No additional management support is needed unless otherwise documented below in the visit note. 

## 2014-03-29 ENCOUNTER — Telehealth: Payer: Self-pay

## 2014-03-29 ENCOUNTER — Ambulatory Visit: Payer: Medicare HMO

## 2014-03-29 NOTE — Telephone Encounter (Signed)
Pt left v/m pt was to cb after taking metformin; pt said she has diarrhea daily at least 3 times a day and FBS is slightly better; averaging 180-196.pt is taking metformin 500 mg twice a day. Pt wants to know what to do? [facility] [place].

## 2014-03-30 NOTE — Telephone Encounter (Signed)
Stop the metformin and please list it on allergy list as an intolerance causing diarrhea Please ask her to get a list of covered alternatives re: DM meds (both oral and injectable and get back to me with that ) Thanks

## 2014-03-30 NOTE — Telephone Encounter (Signed)
Left detailed voicemail letting pt know to stop metformin and to check with ins to get a list of alt meds they cover and let us know. Metformin added to allergy list

## 2014-03-31 ENCOUNTER — Telehealth: Payer: Self-pay | Admitting: Family Medicine

## 2014-03-31 DIAGNOSIS — IMO0002 Reserved for concepts with insufficient information to code with codable children: Secondary | ICD-10-CM

## 2014-03-31 DIAGNOSIS — E1165 Type 2 diabetes mellitus with hyperglycemia: Secondary | ICD-10-CM

## 2014-03-31 NOTE — Telephone Encounter (Signed)
Please let pt know that I rec her ins info- unfortunately there are no copay level 1 or 2 options for her - so we would need to go with a level 3 copay med for her diabetes

## 2014-04-01 NOTE — Telephone Encounter (Signed)
Pt called back and was advised as instructed. Pt cannot take a level 3 copay med; level 3 will put pt in donut hole very quickly and pt cannot afford that. Pt request cb.

## 2014-04-01 NOTE — Telephone Encounter (Signed)
Left voicemail requesting pt to call office 

## 2014-04-01 NOTE — Telephone Encounter (Signed)
Then I think we should refer her to endocrinology for further follow up for DM- and see how they can help- please ask if that is ok

## 2014-04-01 NOTE — Telephone Encounter (Signed)
Pt agrees with referral to [facility], I advise pt that Marion/Linda will call to schedule an appt., pt would like to see someone in [place] area, please put referral in

## 2014-04-27 ENCOUNTER — Ambulatory Visit (INDEPENDENT_AMBULATORY_CARE_PROVIDER_SITE_OTHER): Payer: Commercial Managed Care - HMO | Admitting: Internal Medicine

## 2014-04-27 VITALS — BP 124/62 | HR 91 | Temp 98.0°F | Resp 12 | Ht 60.0 in | Wt 184.0 lb

## 2014-04-27 DIAGNOSIS — E1165 Type 2 diabetes mellitus with hyperglycemia: Secondary | ICD-10-CM

## 2014-04-27 DIAGNOSIS — IMO0002 Reserved for concepts with insufficient information to code with codable children: Secondary | ICD-10-CM

## 2014-04-27 DIAGNOSIS — IMO0001 Reserved for inherently not codable concepts without codable children: Secondary | ICD-10-CM

## 2014-04-27 MED ORDER — METFORMIN HCL ER 500 MG PO TB24: 500.0000 mg | ORAL_TABLET | Freq: Two times a day (BID) | ORAL | Status: DC

## 2014-04-27 NOTE — Patient Instructions (Signed)
Please continue: - Glipizide XL 10 mg in am Please start: - Metformin ER 500 mg with dinner, and after a week, if you can tolerate this well >> add another tablet in am, with breakfast  Please return in 1 month with your sugar log.   PATIENT INSTRUCTIONS FOR TYPE 2 DIABETES:  DIET AND EXERCISE Diet and exercise is an important part of diabetic treatment.  We recommended aerobic exercise in the form of brisk walking (working between 40-60% of maximal aerobic capacity, similar to brisk walking) for 150 minutes per week (such as 30 minutes five days per week) along with 3 times per week performing 'resistance' training (using various gauge rubber tubes with handles) 5-10 exercises involving the major muscle groups (upper body, lower body and core) performing 10-15 repetitions (or near fatigue) each exercise. Start at half the above goal but build slowly to reach the above goals. If limited by weight, joint pain, or disability, we recommend daily walking in a swimming pool with water up to waist to reduce pressure from joints while allow for adequate exercise.    BLOOD GLUCOSES Monitoring your blood glucoses is important for continued management of your diabetes. Please check your blood glucoses 2-4 times a day: fasting, before meals and at bedtime (you can rotate these measurements - e.g. one day check before the 3 meals, the next day check before 2 of the meals and before bedtime, etc.).   HYPOGLYCEMIA (low blood sugar) Hypoglycemia is usually a reaction to not eating, exercising, or taking too much insulin/ other diabetes drugs.  Symptoms include tremors, sweating, hunger, confusion, headache, etc. Treat IMMEDIATELY with 15 grams of Carbs:   4 glucose tablets    cup regular juice/soda   2 tablespoons raisins   4 teaspoons sugar   1 tablespoon honey Recheck blood glucose in 15 mins and repeat above if still symptomatic/blood glucose <100.  RECOMMENDATIONS TO REDUCE YOUR RISK OF DIABETIC  COMPLICATIONS: * Take your prescribed MEDICATION(S) * Follow a DIABETIC diet: Complex carbs, fiber rich foods, (monounsaturated and polyunsaturated) fats * AVOID saturated/trans fats, high fat foods, >2,300 mg salt per day. * EXERCISE at least 5 times a week for 30 minutes or preferably daily.  * DO NOT SMOKE OR DRINK more than 1 drink a day. * Check your FEET every day. Do not wear tightfitting shoes. Contact us if you develop an ulcer * See your EYE doctor once a year or more if needed * Get a FLU shot once a year * Get a PNEUMONIA vaccine once before and once after age 13 years  GOALS:  * Your Hemoglobin A1c of <7%  * fasting sugars need to be <130 * after meals sugars need to be <180 (2h after you start eating) * Your Systolic BP should be 127 or lower  * Your Diastolic BP should be 80 or lower  * Your HDL (Good Cholesterol) should be 40 or higher  * Your LDL (Bad Cholesterol) should be 100 or lower. * Your Triglycerides should be 150 or lower  * Your Urine microalbumin (kidney function) should be <30 * Your Body Mass Index should be 25 or lower    Please consider the following ways to cut down carbs and fat and increase fiber and micronutrients in your diet: - substitute whole grain for white bread or pasta - substitute brown rice for white rice - substitute 90-calorie flat bread pieces for slices of bread when possible - substitute sweet potatoes or yams for white potatoes -  substitute humus for margarine - substitute tofu for cheese when possible - substitute almond or rice milk for regular milk (would not drink soy milk daily due to concern for soy estrogen influence on breast cancer risk) - substitute dark chocolate for other sweets when possible - substitute water - can add lemon or orange slices for taste - for diet sodas (artificial sweeteners will trick your body that you can eat sweets without getting calories and will lead you to overeating and weight gain in the long  run) - do not skip breakfast or other meals (this will slow down the metabolism and will result in more weight gain over time)  - can try smoothies made from fruit and almond/rice milk in am instead of regular breakfast - can also try old-fashioned (not instant) oatmeal made with almond/rice milk in am - order the dressing on the side when eating salad at a restaurant (pour less than half of the dressing on the salad) - eat as little meat as possible - can try juicing, but should not forget that juicing will get rid of the fiber, so would alternate with eating raw veg./fruits or drinking smoothies - use as little oil as possible, even when using olive oil - can dress a salad with a mix of balsamic vinegar and lemon juice, for e.g. - use agave nectar, stevia sugar, or regular sugar rather than artificial sweateners - steam or broil/roast veggies  - snack on veggies/fruit/nuts (unsalted, preferably) when possible, rather than processed foods - reduce or eliminate aspartame in diet (it is in diet sodas, chewing gum, etc) Read the labels!  Try to read Dr. Janene Harvey book: "Program for Reversing Diabetes" for other ideas for healthy eating.

## 2014-04-27 NOTE — Progress Notes (Signed)
Patient ID: HAJRA Erin Beck, female   DOB: Aug 09, 1941, 73 y.o.   MRN: [id number]  HPI: Erin Beck is a 73 y.o.-year-old female, referred by her PCP, Dr. Glori Bickers, for management of DM2, non-insulin-dependent, uncontrolled, without complications.  Patient has been diagnosed with diabetes in ~2005; she has not been on insulin before, would not like to stick herself. Last hemoglobin A1c was: Lab Results  Component Value Date   HGBA1C 8.0* 03/09/2014   HGBA1C 7.3* 10/05/2013   HGBA1C 7.5* 07/06/2013   Pt is on a regimen of: - Glipizide XL 10 mg in am - Metformin 500 mg po bid - restarted after last HbA1c >> diarrhea >> stopped  Pt checks her sugars 1-2 a day and they are higher since her TKR in 2014: - am: 170-180 - 2h after b'fast: n/c - before lunch: n/c - 2h after lunch: n/c - before dinner: n/c - 2h after dinner: low 200s - bedtime: n/c - nighttime: n/c No lows. Lowest sugar was 150;  she has hypoglycemia awareness at 80.  Highest sugar was 220.  Pt's meals are: - Breakfast: cereals - Lunch: salads, 1 sandwich, leftovers - Dinner: salmon/chicken, vegetables - Snacks: 1 fruit, crackers - not every day Cut out soft drinks.  - no CKD, last BUN/creatinine:  Lab Results  Component Value Date   BUN 18 03/09/2014   CREATININE 0.9 03/09/2014  On Lisinopril. - last set of lipids: Lab Results  Component Value Date   CHOL 227* 03/09/2014   HDL 40.10 03/09/2014   LDLCALC 127* 03/09/2014   LDLDIRECT 161.8 10/05/2013   TRIG 300.0* 03/09/2014   CHOLHDL 6 03/09/2014  She was on a statin >> AP.  - last eye exam was in ~06/2013. No DR. Had cataracts sx. - no numbness and tingling in her feet.  Pt has FH of DM in brothers, PGF, cousins.  ROS: Constitutional: + weight loss, no fatigue, + hot flushes Eyes: no blurry vision, no xerophthalmia ENT: no sore throat, no nodules palpated in throat, no dysphagia/odynophagia, no hoarseness, + tinnistus Cardiovascular: no  CP/SOB/palpitations/leg swelling Respiratory: no cough/SOB Gastrointestinal: no N/V/+ D/no C/+ heartburn Musculoskeletal: no muscle/+ joint aches Skin: no rashes, + hair loss Neurological: no tremors/numbness/tingling/dizziness Psychiatric: no depression/+ anxiety  Past Medical History  Diagnosis Date  . Anxiety   . Diabetes mellitus     type II  . Diverticulitis     Hx of  . Hyperlipidemia   . Arthritis     OA  . Osteopenia   . GERD (gastroesophageal reflux disease)   . Hypertension   . Allergic rhinitis   . IBS (irritable bowel syndrome)   . Hematuria     HX OF MICROSCOPIC BLOOD IN URINE AND HX OF CYST ON ONE KIDNEY--BUT NO KIDNEY DISEASE  . Asthma    Past Surgical History  Procedure Laterality Date  . Abdominal hysterectomy    . Cholecystectomy    . Knee arthroscopy  1990    chondromalacia   . Joint replacement  2000    LEFT TOTAL KNEE ARTHROPLASTY  . Total knee arthroplasty  12/05/2011    Procedure: TOTAL KNEE ARTHROPLASTY;  Surgeon: Magnus Sinning, MD;  Location: [facility];  Service: Orthopedics;  Laterality: Right;  . Appendectomy      per pt  . Eye surgery      BILATERAL CATARACTS REMOVED   History   Social History  . Marital Status: Married    Spouse Name: N/A    Number of  Children: 3   Occupational History  . housewife   Social History Main Topics  . Smoking status: Former Smoker -- 1.00 packs/day for 48 years    Quit date: 08/20/2009  . Smokeless tobacco: Never Used  . Alcohol Use: No  . Drug Use: No   Current Outpatient Prescriptions on File Prior to Visit  Medication Sig Dispense Refill  . acetaminophen (TYLENOL) 325 MG tablet Take 2 tablets (650 mg total) by mouth every 6 (six) hours as needed.      Marland Kitchen aspirin 81 MG tablet Take 81 mg by mouth daily.      . ciprofloxacin (CIPRO) 250 MG tablet Take 1 tablet (250 mg total) by mouth 2 (two) times daily.  10 tablet  0  . glipiZIDE (GLUCOTROL XL) 10 MG 24 hr tablet Take 1 tablet (10 mg total) by  mouth daily.  90 tablet  3  . glucose blood test strip 1 each 2 (two) times daily. Check blood sugar twice daily and as needed.      . hydrochlorothiazide (HYDRODIURIL) 25 MG tablet TAKE 1 TABLET BY MOUTH DAILY  90 tablet  3  . Lancets (FREESTYLE) lancets 2 (two) times daily. Check blood sugar twice daily and as needed.      Marland Kitchen lisinopril (PRINIVIL,ZESTRIL) 10 MG tablet TABLET BY MOUTH EVERY DAY  90 tablet  3  . omeprazole (PRILOSEC) 20 MG capsule Take 1 capsule (20 mg total) by mouth daily.  90 capsule  3  . PROAIR HFA 108 (90 BASE) MCG/ACT inhaler INHALE 2 PUFFS INTO THE LUNGS EVERY 6 (SIX) HOURS AS NEEDED.  8.5 each  2   No current facility-administered medications on file prior to visit.   Allergies  Allergen Reactions  . Naproxen Rash    Trouble breathing  . Naproxen Sodium Rash    Trouble breathing  . Aspirin     REACTION: burns stomach PT STATES SHE DOES TOLERATE THE 81 MG ASPIRIN DAILY  . Atorvastatin     REACTION: muscle pain  . Buspar [Buspirone Hcl]     Multiple side eff  . Metformin     REACTION: Diarrhea  . Metformin And Related Diarrhea  . Simvastatin     REACTION: GI side eff and swelling  . Sulfa Antibiotics Other (See Comments)    Severe joint pain  . Sulfonamide Derivatives     REACTION: reaction not known   Family History  Problem Relation Age of Onset  . Diabetes Father   . Diabetes Brother    PE: BP 124/62  Pulse 91  Temp(Src) 98 F (36.7 C) (Oral)  Resp 12  Ht 5' (1.524 m)  Wt 184 lb (83.462 kg)  BMI 35.94 kg/m2  SpO2 96% Wt Readings from Last 3 Encounters:  04/27/14 184 lb (83.462 kg)  03/12/14 191 lb (86.637 kg)  10/28/13 191 lb (86.637 kg)   Constitutional: overweight, in NAD Eyes: PERRLA, EOMI, no exophthalmos ENT: moist mucous membranes, no thyromegaly, no cervical lymphadenopathy Cardiovascular: RRR, No MRG Respiratory: CTA B Gastrointestinal: abdomen soft, NT, ND, BS+ Musculoskeletal: no deformities, strength intact in all  4 Skin: moist, warm, no rashes Neurological: no tremor with outstretched hands, DTR normal in all 4  ASSESSMENT: 1. DM2, non-insulin-dependent, uncontrolled, without complications  PLAN:  1. Patient with long-standing, uncontrolled diabetes, on oral antidiabetic regimen, which became insufficient. She could not tolerate Metformin (regular) and she cannot afford tier 3 and 4 meds. The options that we have are: Actos: 15-30-45 Metformin ER and  regular - We discussed about options for treatment, and I suggested to:  Patient Instructions  Please continue: - Glipizide XL 10 mg in am Please start: - Metformin ER 500 mg with dinner, and after a week, if you can tolerate this well >> add another tablet in am, with breakfast Please return in 1 month with your sugar log.  - Strongly advised her to start checking sugars at different times of the day - check 2 times a day, rotating checks - given sugar log and advised how to fill it and to bring it at next appt  - given foot care handout and explained the principles  - given instructions for hypoglycemia management "15-15 rule"  - advised for yearly eye exams >> she is Up to date - Return to clinic in 1 mo with sugar log

## 2014-05-12 ENCOUNTER — Encounter: Payer: Self-pay | Admitting: Internal Medicine

## 2014-05-17 ENCOUNTER — Other Ambulatory Visit: Payer: Self-pay | Admitting: *Deleted

## 2014-05-17 ENCOUNTER — Telehealth: Payer: Self-pay | Admitting: *Deleted

## 2014-05-17 ENCOUNTER — Encounter: Payer: Self-pay | Admitting: Internal Medicine

## 2014-05-17 MED ORDER — METFORMIN HCL ER 500 MG PO TB24: 500.0000 mg | ORAL_TABLET | Freq: Two times a day (BID) | ORAL | Status: DC

## 2014-05-17 NOTE — Progress Notes (Signed)
Received sugar log from patient - sugars much improved after addition of extended-release metformin: - A.m.: 111-154 - 2 hours after breakfast: 242, 273 - before lunch: n/c - 2 hours after lunch: n/c - Before dinner: 113-153 - 2 hours after dinner: 153, 161 - Bedtime: 164 She is tolerating the metformin xr well.  She would like her medicines called in to [facility]: [phone]; fax: [phone]; 90 day supplies, [employer] ID: [id number]

## 2014-05-17 NOTE — Telephone Encounter (Signed)
Received sugar log from patient - sugars much improved after addition of extended-release metformin: Pt informed per MD her sugars are much better. She is requesting Metformin XR 90 day supply to [facility]. Rf sent. Pt informed

## 2014-06-02 ENCOUNTER — Telehealth: Payer: Self-pay

## 2014-06-02 NOTE — Telephone Encounter (Signed)
Dr Erin Beck takes care of her DM now - please forward - need to confirm testing frequency with her record I put letter in IN box

## 2014-06-02 NOTE — Telephone Encounter (Signed)
Pt left letter from Erin Beck (copy on Dr Marliss Coots shelf) requesting new prescription for Accuchek Kit Nano, accuchek smartview strip and accuchek fastclix lancets drum. Pt made notation on letter that she checks her BS before and after breakfast and before and after dinner.Please advise.

## 2014-06-03 ENCOUNTER — Other Ambulatory Visit: Payer: Self-pay | Admitting: *Deleted

## 2014-06-03 MED ORDER — GLUCOSE BLOOD VI STRP: ORAL_STRIP | Status: DC

## 2014-06-03 MED ORDER — ACCU-CHEK FASTCLIX LANCETS MISC: Status: DC

## 2014-06-03 MED ORDER — ACCU-CHEK NANO SMARTVIEW W/DEVICE KIT: PACK | Status: DC

## 2014-06-03 NOTE — Telephone Encounter (Signed)
Letter faxed to Dr. Cruzita Lederer and phone note sent to her too

## 2014-06-03 NOTE — Telephone Encounter (Signed)
Larene Beach, can you please send these? Thank you.

## 2014-06-03 NOTE — Telephone Encounter (Signed)
Already taken care of

## 2014-06-07 ENCOUNTER — Telehealth: Payer: Self-pay | Admitting: Family Medicine

## 2014-06-07 ENCOUNTER — Ambulatory Visit (INDEPENDENT_AMBULATORY_CARE_PROVIDER_SITE_OTHER): Payer: Commercial Managed Care - HMO | Admitting: Internal Medicine

## 2014-06-07 ENCOUNTER — Encounter: Payer: Self-pay | Admitting: Internal Medicine

## 2014-06-07 VITALS — BP 118/60 | HR 95 | Temp 97.6°F | Resp 12 | Wt 188.0 lb

## 2014-06-07 DIAGNOSIS — E785 Hyperlipidemia, unspecified: Secondary | ICD-10-CM

## 2014-06-07 DIAGNOSIS — IMO0002 Reserved for concepts with insufficient information to code with codable children: Secondary | ICD-10-CM

## 2014-06-07 DIAGNOSIS — I10 Essential (primary) hypertension: Secondary | ICD-10-CM

## 2014-06-07 DIAGNOSIS — E1165 Type 2 diabetes mellitus with hyperglycemia: Secondary | ICD-10-CM

## 2014-06-07 DIAGNOSIS — J069 Acute upper respiratory infection, unspecified: Secondary | ICD-10-CM

## 2014-06-07 NOTE — Progress Notes (Signed)
Patient ID: Erin Beck, female   DOB: December 02, 1940, 73 y.o.   MRN: [id number]  HPI: Erin Beck is a 73 y.o.-year-old female, returning for f/u for DM2, non-insulin-dependent, dx 2005, uncontrolled, without complications. Last visit 1 mo ago.  She has an URI >> fever last night 100.74F.   She has not been on insulin before, would not like to stick herself.  Last hemoglobin A1c was: Lab Results  Component Value Date   HGBA1C 8.0* 03/09/2014   HGBA1C 7.3* 10/05/2013   HGBA1C 7.5* 07/06/2013   Pt is on a regimen of: - Glipizide XL 10 mg in am - Metformin 500 mg po bid >> Metformin ER  Pt checks her sugars 1-2 a day >> much improved (no log): - am: 170-180 >> 111-154 >> 90-127 - 2h after b'fast: n/c >> 242, 273 >> 150s - before lunch: n/c  - 2h after lunch: n/c - before dinner: n/c >> 113-153 >> n/c - 2h after dinner: low 200s >> 153, 161 >> 150-160 - bedtime: n/c >> 164 >> 120-140 - nighttime: n/c >> 80 x1 No lows. Lowest sugar was 80s (skipped dinner);  she has hypoglycemia awareness at 80.  Highest sugar was 220 >> 160.  Pt's meals are: - Breakfast: cereals - Lunch: salads, 1 sandwich, leftovers - Dinner: salmon/chicken, vegetables - Snacks: 1 fruit, crackers - not every day Cut out soft drinks.  - no CKD, last BUN/creatinine:  Lab Results  Component Value Date   BUN 18 03/09/2014   CREATININE 0.9 03/09/2014  On Lisinopril. - last set of lipids: Lab Results  Component Value Date   CHOL 227* 03/09/2014   HDL 40.10 03/09/2014   LDLCALC 127* 03/09/2014   LDLDIRECT 161.8 10/05/2013   TRIG 300.0* 03/09/2014   CHOLHDL 6 03/09/2014  She was on a statin >> AP.  - last eye exam was in ~06/2013. No DR. Had cataracts sx. - no numbness and tingling in her feet.  I reviewed pt's medications, allergies, PMH, social hx, family hx and no changes required, except as mentioned above.  ROS: Constitutional: + weight loss, no fatigue, + fever - see above Eyes: + blurry vision, no  xerophthalmia ENT: + sore throat, no nodules palpated in throat, no dysphagia/odynophagia, no hoarseness Cardiovascular: no CP/SOB/palpitations/leg swelling Respiratory: + cough/no SOB Gastrointestinal: no N/V/D/C/heartburn Musculoskeletal: + muscle/no joint aches Skin: no rashes, + hair loss Neurological: no tremors/numbness/tingling/dizziness, + HA  PE: BP 118/60  Pulse 95  Temp(Src) 97.6 F (36.4 C) (Oral)  Resp 12  Wt 188 lb (85.276 kg)  SpO2 96% Wt Readings from Last 3 Encounters:  06/07/14 188 lb (85.276 kg)  04/27/14 184 lb (83.462 kg)  03/12/14 191 lb (86.637 kg)   Constitutional: overweight, in NAD, nasal congestion Eyes: PERRLA, EOMI, no exophthalmos ENT: moist mucous membranes, no thyromegaly, no cervical lymphadenopathy, tympanic mb clear B Cardiovascular: RRR, No MRG Respiratory: CTA B Gastrointestinal: abdomen soft, NT, ND, BS+ Musculoskeletal: no deformities, strength intact in all 4 Skin: moist, warm, no rashes Neurological: no tremor with outstretched hands, DTR normal in all 4  ASSESSMENT: 1. DM2, non-insulin-dependent, uncontrolled, without complications  2. URI  PLAN:  1. Patient with long-standing, uncontrolled diabetes, on oral antidiabetic regimen, now with improved control after switching to Metformin ER.  - Since sugars much improved, I suggested to:  Patient Instructions  Try Sudafed for your congestion. Continue Metformin ER 500 mg 2x a day. Continue Glipizide XL 10 mg in am. Please return in 3 months  with your sugar log.  - continue checking sugars at different times of the day - check 2 times a day, rotating checks - check HbA1c tomorrow, at appt with PCP - advised for yearly eye exams >> she is Up to date - Return to clinic in 3 mo with sugar log   2. URI - + low grade fever, congestion - did not try decongestants - appears viral - suggested Sudafed  HbA1c was not drawn.Marland KitchenMarland Kitchen

## 2014-06-07 NOTE — Patient Instructions (Signed)
Try Sudafed for your congestion.  Continue Metformin ER 500 mg 2x a day. Continue Glipizide XL 10 mg in am.  Please return in 3 months with your sugar log.

## 2014-06-07 NOTE — Telephone Encounter (Signed)
Message copied by Abner Greenspan on Mon Jun 07, 2014  9:26 PM ------      Message from: Ellamae Sia      Created: Fri Jun 04, 2014  3:33 PM      Regarding: Lab orders for Tuesday, 10.20.15       Patient is scheduled for CPX labs, please order future labs, Thanks , Terri       ------

## 2014-06-08 ENCOUNTER — Other Ambulatory Visit (INDEPENDENT_AMBULATORY_CARE_PROVIDER_SITE_OTHER): Payer: Commercial Managed Care - HMO

## 2014-06-08 ENCOUNTER — Encounter: Payer: Self-pay | Admitting: Family Medicine

## 2014-06-08 ENCOUNTER — Ambulatory Visit (INDEPENDENT_AMBULATORY_CARE_PROVIDER_SITE_OTHER): Payer: Commercial Managed Care - HMO | Admitting: Family Medicine

## 2014-06-08 VITALS — BP 114/74 | HR 87 | Temp 97.5°F | Ht 60.0 in | Wt 188.5 lb

## 2014-06-08 DIAGNOSIS — J019 Acute sinusitis, unspecified: Secondary | ICD-10-CM

## 2014-06-08 DIAGNOSIS — E1165 Type 2 diabetes mellitus with hyperglycemia: Secondary | ICD-10-CM

## 2014-06-08 DIAGNOSIS — Z0000 Encounter for general adult medical examination without abnormal findings: Secondary | ICD-10-CM

## 2014-06-08 DIAGNOSIS — B9689 Other specified bacterial agents as the cause of diseases classified elsewhere: Secondary | ICD-10-CM

## 2014-06-08 DIAGNOSIS — I10 Essential (primary) hypertension: Secondary | ICD-10-CM

## 2014-06-08 DIAGNOSIS — IMO0002 Reserved for concepts with insufficient information to code with codable children: Secondary | ICD-10-CM

## 2014-06-08 DIAGNOSIS — E785 Hyperlipidemia, unspecified: Secondary | ICD-10-CM

## 2014-06-08 LAB — CBC WITH DIFFERENTIAL/PLATELET
BASOS ABS: 0.1 10*3/uL (ref 0.0–0.1)
Basophils Relative: 0.8 % (ref 0.0–3.0)
Eosinophils Absolute: 0.5 10*3/uL (ref 0.0–0.7)
Eosinophils Relative: 5.7 % — ABNORMAL HIGH (ref 0.0–5.0)
HCT: 43.5 % (ref 36.0–46.0)
HEMOGLOBIN: 14.4 g/dL (ref 12.0–15.0)
LYMPHS ABS: 1.2 10*3/uL (ref 0.7–4.0)
LYMPHS PCT: 14.5 % (ref 12.0–46.0)
MCHC: 33.1 g/dL (ref 30.0–36.0)
MCV: 91.3 fl (ref 78.0–100.0)
MONO ABS: 0.5 10*3/uL (ref 0.1–1.0)
Monocytes Relative: 6.4 % (ref 3.0–12.0)
NEUTROS ABS: 6 10*3/uL (ref 1.4–7.7)
Neutrophils Relative %: 72.6 % (ref 43.0–77.0)
Platelets: 253 10*3/uL (ref 150.0–400.0)
RBC: 4.77 Mil/uL (ref 3.87–5.11)
RDW: 13.1 % (ref 11.5–15.5)
WBC: 8.3 10*3/uL (ref 4.0–10.5)

## 2014-06-08 LAB — COMPREHENSIVE METABOLIC PANEL
ALT: 59 U/L — ABNORMAL HIGH (ref 0–35)
AST: 36 U/L (ref 0–37)
Albumin: 3.5 g/dL (ref 3.5–5.2)
Alkaline Phosphatase: 73 U/L (ref 39–117)
BUN: 15 mg/dL (ref 6–23)
CALCIUM: 9.3 mg/dL (ref 8.4–10.5)
CHLORIDE: 98 meq/L (ref 96–112)
CO2: 26 mEq/L (ref 19–32)
CREATININE: 0.8 mg/dL (ref 0.4–1.2)
GFR: 76.92 mL/min (ref >60.00)
Glucose, Bld: 139 mg/dL — ABNORMAL HIGH (ref 70–99)
Potassium: 4 mEq/L (ref 3.5–5.1)
Sodium: 137 mEq/L (ref 135–145)
Total Bilirubin: 0.7 mg/dL (ref 0.2–1.2)
Total Protein: 7.4 g/dL (ref 6.0–8.3)

## 2014-06-08 LAB — LIPID PANEL
Cholesterol: 230 mg/dL — ABNORMAL HIGH (ref 0–200)
HDL: 30.8 mg/dL — ABNORMAL LOW (ref >39.00)
NONHDL: 199.2
TRIGLYCERIDES: 275 mg/dL — AB (ref 0.0–149.0)
Total CHOL/HDL Ratio: 7
VLDL: 55 mg/dL — AB (ref 0.0–40.0)

## 2014-06-08 LAB — LDL CHOLESTEROL, DIRECT: Direct LDL: 153.7 mg/dL

## 2014-06-08 LAB — TSH: TSH: 1.76 u[IU]/mL (ref 0.35–4.50)

## 2014-06-08 MED ORDER — AMOXICILLIN-POT CLAVULANATE 875-125 MG PO TABS: 1.0000 | ORAL_TABLET | Freq: Two times a day (BID) | ORAL | Status: DC

## 2014-06-08 NOTE — Assessment & Plan Note (Signed)
Cover with augmentin L sided maxillary with ETD  Recommend saline irritation and warm comp  Will re check at her 1 week f/u (PE) Update if not starting to improve in a week or if worsening

## 2014-06-08 NOTE — Patient Instructions (Signed)
You have a sinus infection  Take the augmentin as directed  Drink fluids and rest  Nasal saline or a netti pot for congestion Also warm compresses on sinuses   Update if not starting to improve in a week or if worsening

## 2014-06-08 NOTE — Progress Notes (Signed)
Pre visit review using our clinic review tool, if applicable. No additional management support is needed unless otherwise documented below in the visit note. 

## 2014-06-08 NOTE — Progress Notes (Signed)
Subjective:    Patient ID: Erin Beck, female    DOB: 10-13-1940, 73 y.o.   MRN: [id number]  HPI Here with uri symptoms  3 weeks- got better and then worse  Now has a bad headache-especially on the L side  Nose was runny- yellow/ coughing up yellow mucous now  101 temp yesterday - improved since then   DM control is much better since seeing Dr Cruzita Lederer   Patient Active Problem List   Diagnosis Date Noted  . Right shoulder pain 03/12/2014  . UTI (urinary tract infection) 10/28/2013  . Vitreomacular adhesion 05/05/2013  . Musculoskeletal chest pain 04/23/2013  . Unspecified asthma(493.90) 04/23/2013  . Encounter for Medicare annual wellness exam 03/03/2013  . Personal history of colonic polyps 03/03/2013  . Post-menopausal 10/17/2012  . Injury of left toe 09/25/2012  . Postoperative anemia 12/26/2011  . Obesity 10/31/2011  . Other screening mammogram 10/31/2011  . IBS (irritable bowel syndrome) 08/10/2011  . GERD 07/31/2010  . ALLERGIC RHINITIS 12/05/2007  . HYPERTENSION, BENIGN ESSENTIAL 02/05/2007  . TOBACCO USE, QUIT 02/05/2007  . Diabetes type 2, uncontrolled 01/31/2007  . Hyperlipidemia 01/31/2007  . ANXIETY 01/31/2007  . OSTEOARTHRITIS 01/31/2007  . DIVERTICULITIS, HX OF 01/31/2007   Past Medical History  Diagnosis Date  . Anxiety   . Diabetes mellitus     type II  . Diverticulitis     Hx of  . Hyperlipidemia   . Arthritis     OA  . Osteopenia   . GERD (gastroesophageal reflux disease)   . Hypertension   . Allergic rhinitis   . IBS (irritable bowel syndrome)   . Hematuria     HX OF MICROSCOPIC BLOOD IN URINE AND HX OF CYST ON ONE KIDNEY--BUT NO KIDNEY DISEASE  . Asthma    Past Surgical History  Procedure Laterality Date  . Abdominal hysterectomy    . Cholecystectomy    . Knee arthroscopy  1990    chondromalacia   . Joint replacement  2000    LEFT TOTAL KNEE ARTHROPLASTY  . Total knee arthroplasty  12/05/2011    Procedure: TOTAL KNEE  ARTHROPLASTY;  Surgeon: Magnus Sinning, MD;  Location: [facility];  Service: Orthopedics;  Laterality: Right;  . Appendectomy      per pt  . Eye surgery      BILATERAL CATARACTS REMOVED   History  Substance Use Topics  . Smoking status: Former Smoker -- 1.00 packs/day for 48 years    Quit date: 08/20/2009  . Smokeless tobacco: Never Used  . Alcohol Use: No   Family History  Problem Relation Age of Onset  . Diabetes Father   . Diabetes Brother    Allergies  Allergen Reactions  . Naproxen Rash    Trouble breathing  . Naproxen Sodium Rash    Trouble breathing  . Aspirin     REACTION: burns stomach PT STATES SHE DOES TOLERATE THE 81 MG ASPIRIN DAILY  . Atorvastatin     REACTION: muscle pain  . Buspar [Buspirone Hcl]     Multiple side eff  . Metformin     REACTION: Diarrhea  . Metformin And Related Diarrhea  . Simvastatin     REACTION: GI side eff and swelling  . Sulfa Antibiotics Other (See Comments)    Severe joint pain  . Sulfonamide Derivatives     REACTION: reaction not known   Current Outpatient Prescriptions on File Prior to Visit  Medication Sig Dispense Refill  . ACCU-CHEK FASTCLIX  LANCETS MISC Use to test blood sugar 4 times daily as instructed. Dx code: E11.65  400 each  3  . acetaminophen (TYLENOL) 325 MG tablet Take 2 tablets (650 mg total) by mouth every 6 (six) hours as needed.      Marland Kitchen aspirin 81 MG tablet Take 81 mg by mouth daily.      . Blood Glucose Monitoring Suppl (ACCU-CHEK NANO SMARTVIEW) W/DEVICE KIT Use to test blood sugar daily.  1 kit  0  . ciprofloxacin (CIPRO) 250 MG tablet Take 1 tablet (250 mg total) by mouth 2 (two) times daily.  10 tablet  0  . glipiZIDE (GLUCOTROL XL) 10 MG 24 hr tablet Take 1 tablet (10 mg total) by mouth daily.  90 tablet  3  . glucose blood (ACCU-CHEK SMARTVIEW) test strip Use to test blood sugar 4 times daily as instructed. Dx code: E11.65  400 each  3  . glucose blood test strip 1 each 2 (two) times daily. Check blood  sugar twice daily and as needed.      . hydrochlorothiazide (HYDRODIURIL) 25 MG tablet TAKE 1 TABLET BY MOUTH DAILY  90 tablet  3  . Lancets (FREESTYLE) lancets 2 (two) times daily. Check blood sugar twice daily and as needed.      Marland Kitchen lisinopril (PRINIVIL,ZESTRIL) 10 MG tablet TABLET BY MOUTH EVERY DAY  90 tablet  3  . metFORMIN (GLUCOPHAGE-XR) 500 MG 24 hr tablet Take 1 tablet (500 mg total) by mouth 2 (two) times daily with a meal.  180 tablet  3  . omeprazole (PRILOSEC) 20 MG capsule Take 1 capsule (20 mg total) by mouth daily.  90 capsule  3  . PROAIR HFA 108 (90 BASE) MCG/ACT inhaler INHALE 2 PUFFS INTO THE LUNGS EVERY 6 (SIX) HOURS AS NEEDED.  8.5 each  2   No current facility-administered medications on file prior to visit.      Review of Systems    Review of Systems  Constitutional: Negative for , appetite change, and unexpected weight change. pos for fatigue and malaise ENT pos for cong / rhinorrhea /ear pain and sinus pain  Eyes: Negative for pain and visual disturbance.  Respiratory: Negative for  shortness of breath.   Cardiovascular: Negative for cp or palpitations    Gastrointestinal: Negative for nausea, diarrhea and constipation.  Genitourinary: Negative for urgency and frequency.  Skin: Negative for pallor or rash   Neurological: Negative for weakness, light-headedness, numbness and headaches.  Hematological: Negative for adenopathy. Does not bruise/bleed easily.  Psychiatric/Behavioral: Negative for dysphoric mood. The patient is not nervous/anxious.      Objective:   Physical Exam  Constitutional: She appears well-developed and well-nourished. No distress.  obese and well appearing   HENT:  Head: Normocephalic and atraumatic.  Right Ear: External ear normal.  Mouth/Throat: Oropharynx is clear and moist. No oropharyngeal exudate.  Nares are injected and congested  L TM dull L maxillary sinus tenderness     Eyes: Conjunctivae and EOM are normal. Pupils are  equal, round, and reactive to light. Right eye exhibits no discharge. Left eye exhibits no discharge.  Neck: Normal range of motion. Neck supple.  Cardiovascular: Normal rate and regular rhythm.   Pulmonary/Chest: Effort normal and breath sounds normal. No respiratory distress. She has no wheezes. She has no rales.  Lymphadenopathy:    She has no cervical adenopathy.  Neurological: She is alert.  Skin: Skin is warm and dry. No rash noted.  Psychiatric: She has a  normal mood and affect.          Assessment & Plan:   Problem List Items Addressed This Visit     Respiratory   Acute bacterial sinusitis - Primary     Cover with augmentin L sided maxillary with ETD  Recommend saline irritation and warm comp  Will re check at her 1 week f/u (PE) Update if not starting to improve in a week or if worsening      Relevant Medications      amoxicillin-clavulanate (AUGMENTIN) tablet 875-125 mg

## 2014-06-10 ENCOUNTER — Ambulatory Visit: Payer: Commercial Managed Care - HMO | Admitting: Family Medicine

## 2014-06-10 ENCOUNTER — Telehealth: Payer: Self-pay | Admitting: Family Medicine

## 2014-06-10 ENCOUNTER — Ambulatory Visit (INDEPENDENT_AMBULATORY_CARE_PROVIDER_SITE_OTHER)
Admission: RE | Admit: 2014-06-10 | Discharge: 2014-06-10 | Disposition: A | Discharge status: Home / Self Care | Payer: Commercial Managed Care - HMO | Source: Ambulatory Visit | Attending: Cardiology | Admitting: Cardiology

## 2014-06-10 ENCOUNTER — Ambulatory Visit (INDEPENDENT_AMBULATORY_CARE_PROVIDER_SITE_OTHER): Payer: Commercial Managed Care - HMO | Admitting: Internal Medicine

## 2014-06-10 ENCOUNTER — Encounter: Payer: Self-pay | Admitting: Internal Medicine

## 2014-06-10 VITALS — BP 156/82 | Temp 97.5°F | Wt 186.1 lb

## 2014-06-10 DIAGNOSIS — H9209 Otalgia, unspecified ear: Secondary | ICD-10-CM | POA: Insufficient documentation

## 2014-06-10 DIAGNOSIS — R51 Headache: Secondary | ICD-10-CM

## 2014-06-10 DIAGNOSIS — R519 Headache, unspecified: Secondary | ICD-10-CM

## 2014-06-10 DIAGNOSIS — H9202 Otalgia, left ear: Secondary | ICD-10-CM

## 2014-06-10 DIAGNOSIS — B9689 Other specified bacterial agents as the cause of diseases classified elsewhere: Secondary | ICD-10-CM

## 2014-06-10 DIAGNOSIS — J019 Acute sinusitis, unspecified: Secondary | ICD-10-CM

## 2014-06-10 MED ORDER — HYDROCODONE-ACETAMINOPHEN 5-325 MG PO TABS: 1.0000 | ORAL_TABLET | ORAL | Status: DC | PRN

## 2014-06-10 NOTE — Telephone Encounter (Signed)
Spoke with pt and she does not want to go to UC and has appt today at 9:45 am with Dr Regis Bill. Pt will keep appt with Dr Regis Bill.

## 2014-06-10 NOTE — Telephone Encounter (Signed)
Patient Information:  Caller Name: [place]  Phone: [phone]  Patient: Erin Beck, Erin Beck  Gender: Female  DOB: [birth year]  Age: 73 Years  PCP: [name] [facility])  Office Follow Up:  Does the office need to follow up with this patient?: No  Instructions For The Office: N/A   Symptoms  Reason For Call & Symptoms: Left ear throbbing; on antibiotics since 06/08/14.  Reports it is painful to talk; pain shooting from ear through jaw. She reports yellow sputum; mild improvement since starting antibiotics.  Relates pain is worse since getting up at 07:00 on 06/10/14.  Pain rated as 10 of 10.  Go to Office Now per Earache guideline due to Severe earache pain. Appointment with Dr. Regis Bill at [place] location due to no appointments at [facility] and no appointment with Ms. Megan Salon within timeframe.  Reviewed Health History In EMR: Yes  Reviewed Medications In EMR: Yes  Reviewed Allergies In EMR: Yes  Reviewed Surgeries / Procedures: Yes  Date of Onset of Symptoms: 06/03/2014  Guideline(s) Used:  Earache  Disposition Per Guideline:   Go to Office Now  Reason For Disposition Reached:   Severe earache pain  Advice Given:  Pain Medicines:  For pain relief, you can take either acetaminophen, ibuprofen, or naproxen.  Call Back If  You become worse.  RN Overrode Recommendation:  Make Appointment  Scheduled  Appointment Scheduled:  06/10/2014 09:45:00 Appointment Scheduled Provider:  Other

## 2014-06-10 NOTE — Patient Instructions (Signed)
Uncertain cause of severity of pain but acts like a neuritis nerve pain . Get head scan  .  Pain med as needed with caution. If any rash  At all  coantct [place] consider ation of ? Shingles? Get fu appt with dr [facility]  Next week. Or as needed

## 2014-06-10 NOTE — Telephone Encounter (Signed)
2:30 with me or UC if she doesn't want to wait.

## 2014-06-10 NOTE — Progress Notes (Signed)
Pre visit review using our clinic review tool, if applicable. No additional management support is needed unless otherwise documented below in the visit note.   Chief Complaint  Patient presents with  . Left ear pain    Started antibiotic 06/08/14    HPI: Patient Erin Beck  comes in today for SDA for  worsening problem evaluation. Had ear pain since Monday 4 days ago and had sinus infection rxed by her PCP And given antibiotic  Augmentin however is notch Worse  with left ear pain sensitive and Hard to touch . Also around the left anterior neck and posterior part of her head Was she was sitting in the office she touched it and felt think shoot down through her toes. Denies any head trauma vision changes has type 2 diabetes and degenerative disc disease. Nasal drainage no fever pain is there is severe and comes in waves. Denies specific weakness unusual rashes or hearing changes. ROS: See pertinent positives and negatives per HPI. No fever or syncope vision hearing changes.  See CAN  Past Medical History  Diagnosis Date  . Anxiety   . Diabetes mellitus     type II  . Diverticulitis     Hx of  . Hyperlipidemia   . Arthritis     OA  . Osteopenia   . GERD (gastroesophageal reflux disease)   . Hypertension   . Allergic rhinitis   . IBS (irritable bowel syndrome)   . Hematuria     HX OF MICROSCOPIC BLOOD IN URINE AND HX OF CYST ON ONE KIDNEY--BUT NO KIDNEY DISEASE  . Asthma     Family History  Problem Relation Age of Onset  . Diabetes Father   . Diabetes Brother     History   Social History  . Marital Status: Married    Spouse Name: N/A    Number of Children: N/A  . Years of Education: N/A   Social History Main Topics  . Smoking status: Former Smoker -- 1.00 packs/day for 48 years    Quit date: 08/20/2009  . Smokeless tobacco: Never Used  . Alcohol Use: No  . Drug Use: No  . Sexual Activity: Not on file   Other Topics Concern  . Not on file   Social  History Narrative  . No narrative on file    Outpatient Encounter Prescriptions as of 06/10/2014  Medication Sig  . ACCU-CHEK FASTCLIX LANCETS MISC Use to test blood sugar 4 times daily as instructed. Dx code: E11.65  . acetaminophen (TYLENOL) 325 MG tablet Take 2 tablets (650 mg total) by mouth every 6 (six) hours as needed.  Marland Kitchen amoxicillin-clavulanate (AUGMENTIN) 875-125 MG per tablet Take 1 tablet by mouth 2 (two) times daily.  Marland Kitchen aspirin 81 MG tablet Take 81 mg by mouth daily.  . Blood Glucose Monitoring Suppl (ACCU-CHEK NANO SMARTVIEW) W/DEVICE KIT Use to test blood sugar daily.  Marland Kitchen glipiZIDE (GLUCOTROL XL) 10 MG 24 hr tablet Take 1 tablet (10 mg total) by mouth daily.  Marland Kitchen glucose blood (ACCU-CHEK SMARTVIEW) test strip Use to test blood sugar 4 times daily as instructed. Dx code: E11.65  . glucose blood test strip 1 each 2 (two) times daily. Check blood sugar twice daily and as needed.  . hydrochlorothiazide (HYDRODIURIL) 25 MG tablet TAKE 1 TABLET BY MOUTH DAILY  . Lancets (FREESTYLE) lancets 2 (two) times daily. Check blood sugar twice daily and as needed.  Marland Kitchen lisinopril (PRINIVIL,ZESTRIL) 10 MG tablet TABLET BY MOUTH EVERY DAY  .  metFORMIN (GLUCOPHAGE-XR) 500 MG 24 hr tablet Take 1 tablet (500 mg total) by mouth 2 (two) times daily with a meal.  . omeprazole (PRILOSEC) 20 MG capsule Take 1 capsule (20 mg total) by mouth daily.  Marland Kitchen PROAIR HFA 108 (90 BASE) MCG/ACT inhaler INHALE 2 PUFFS INTO THE LUNGS EVERY 6 (SIX) HOURS AS NEEDED.  Marland Kitchen HYDROcodone-acetaminophen (NORCO/VICODIN) 5-325 MG per tablet Take 1 tablet by mouth every 4 (four) hours as needed for moderate pain.    EXAM:  BP 156/82  Temp(Src) 97.5 F (36.4 C) (Oral)  Wt 186 lb 1.6 oz (84.414 kg)  Body mass index is 36.34 kg/(m^2).  GENERAL: vitals reviewed and listed above, alert, oriented, appears well hydrated and in no acute distress however looks uncomfortable and she states her left head is hurting. HEENT: atraumatic,  conjunctiva  clear, no obvious abnormalities on inspection of external nose and ears some eye clear drainage EOMs appear and okay PERR? No photophobia OP : no lesion edema or exudate has dentures nose has mucoid yellow discharge face mildly tender in maxillary area Scalp no acute rashes some erythema where she is touching. External ear no rash no vesicles NECK: no obvious masses on inspection palpation tenderness left lateral area. CV: HRRR, no clubbing cyanosis  nl cap refill  MS: moves all extremities  upper extremities appear equal PSYCH: pleasant and cooperative speech is normal walks with a cane  ASSESSMENT AND PLAN:  Discussed the following assessment and plan:  Acute ear pain, left - Plan: CT Head Wo Contrast  Headache, unspecified headache type - Plan: CT Head Wo Contrast  Acute bacterial sinusitis Symptoms occurring with symptoms of sinusitis however these sound like a neuritis type of symptom or similar to a cluster headache .  No focal neurologic obvious. Another explanation could be shingles before the rash. Prescription of her hydrocodone head CT plan for new onset pain.Marland Kitchen Her regular occipital. Needs to followup with PCP office. 2 ED if getting worse. -Patient advised to return or notify health care team  if symptoms worsen ,persist or new concerns arise.  Patient Instructions  Uncertain cause of severity of pain but acts like a neuritis nerve pain . Get head scan  .  Pain med as needed with caution. If any rash  At all  coantct [place] consider ation of ? Shingles? Get fu appt with dr tower office  Next week. Or as needed      Standley Brooking. Panosh M.D.

## 2014-06-14 ENCOUNTER — Other Ambulatory Visit: Payer: Self-pay | Admitting: Family Medicine

## 2014-06-15 ENCOUNTER — Encounter: Payer: Self-pay | Admitting: Family Medicine

## 2014-06-15 ENCOUNTER — Ambulatory Visit (INDEPENDENT_AMBULATORY_CARE_PROVIDER_SITE_OTHER): Payer: Commercial Managed Care - HMO | Admitting: Family Medicine

## 2014-06-15 VITALS — BP 124/66 | HR 96 | Temp 98.0°F | Ht 60.0 in | Wt 186.5 lb

## 2014-06-15 DIAGNOSIS — G8929 Other chronic pain: Principal | ICD-10-CM

## 2014-06-15 DIAGNOSIS — M549 Dorsalgia, unspecified: Secondary | ICD-10-CM

## 2014-06-15 DIAGNOSIS — E1165 Type 2 diabetes mellitus with hyperglycemia: Secondary | ICD-10-CM

## 2014-06-15 DIAGNOSIS — M542 Cervicalgia: Secondary | ICD-10-CM | POA: Insufficient documentation

## 2014-06-15 DIAGNOSIS — T881XXA Other complications following immunization, not elsewhere classified, initial encounter: Secondary | ICD-10-CM

## 2014-06-15 DIAGNOSIS — IMO0002 Reserved for concepts with insufficient information to code with codable children: Secondary | ICD-10-CM

## 2014-06-15 DIAGNOSIS — I10 Essential (primary) hypertension: Secondary | ICD-10-CM
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
## 2014-06-15 NOTE — Progress Notes (Signed)
Pre visit review using our clinic review tool, if applicable. No additional management support is needed unless otherwise documented below in the visit note. 

## 2014-06-15 NOTE — Patient Instructions (Signed)
Since you do not tolerate cholesterol medication -watch diet because cholesterol is up  Avoid red meat/ fried foods/ egg yolks/ fatty breakfast meats/ butter, cheese and high fat dairy/ and shellfish   Glucose looks improved  Stop at check out for referrals  Follow up in 2 weeks for flu shot  Your shingles shot area looks ok - use a warm or cool compress on it

## 2014-06-16 ENCOUNTER — Telehealth: Payer: Self-pay

## 2014-06-16 DIAGNOSIS — T881XXA Other complications following immunization, not elsewhere classified, initial encounter: Secondary | ICD-10-CM | POA: Insufficient documentation

## 2014-06-16 NOTE — Telephone Encounter (Signed)
Pt notified of CT results. Pt said the assistant that called her with the results from Dr. Velora Mediate office said that the growth in her frontal sinus is a Bone tumor. Pt's said her niece that is a nurse looked it up and she said that usually the bone tumor in the sinus is fast growing and could take up whole sinus cavity so pt is worried, pt would like you opinion about the growth, please advise.  Copy of CT mailed to pt per pt request

## 2014-06-16 NOTE — Assessment & Plan Note (Signed)
Pt states that this has become unbearable and that she has a hx of deg disc dz  Desires orthopedic ref-done  Neck pain may be source of headache-although that is improved She is tearful when discussing her pain

## 2014-06-16 NOTE — Assessment & Plan Note (Signed)
Continues f/u with Dr Cruzita Lederer  Her am blood glucose readings are improved Enc to check PM ones  Ref to opthy for her annual eye exam

## 2014-06-16 NOTE — Assessment & Plan Note (Signed)
bp in fair control at this time  BP Readings from Last 1 Encounters:  06/15/14 124/66   No changes needed Disc lifstyle change with low sodium diet and exercise  Last labs reviewed

## 2014-06-16 NOTE — Telephone Encounter (Signed)
Pt left v/m; pt was seen 06/15/14 and forgot to ask Dr Glori Bickers about CT scan of head done on 06/10/14; Dr Regis Bill told pt Dr Glori Bickers would go over findings with pt. Pt request cb.

## 2014-06-16 NOTE — Telephone Encounter (Signed)
No acute findings in the brain  A growth in frontal sinus -considered benign appearing -likely this is incidental finding   CT was reassuring   Some age related brain changes to be expected Mail her a copy of the report please

## 2014-06-16 NOTE — Assessment & Plan Note (Signed)
L arm - erythema  Adv warm and cool compresses  Update if no imp  Not having fever or other symptoms

## 2014-06-17 ENCOUNTER — Ambulatory Visit: Payer: Self-pay | Admitting: Family Medicine

## 2014-06-17 NOTE — Telephone Encounter (Signed)
Patient Information:  Caller Name: Yanni  Phone: [phone]  Patient: , Erin Beck  Gender: Female  DOB: Aug 31, 1940  Age: 73 Years  PCP: [name] [facility])  Office Follow Up:  Does the office need to follow up with this patient?: No  Instructions For The Office: N/A  RN Note:  No availability with Dr. Glori Bickers or Dr. Regis Bill today. Patient request Dr. Regis Bill. No availability with Panosh. Dr. Glori Bickers not in office. [place] office request that patient stay at [place].   Appt scheduled with Dr. Edilia Bo 12:00 today 06/17/2014  Symptoms  Reason For Call & Symptoms: Reason For Call & Symptoms: Patient states she has been sick for 3 weeks.  Thought to be sinus infection. Increasing headache , neck , shoulder and ear pain. Evaluated by Dr. Glori Bickers and Panosh. Sent for CT .  Rt sinus osteoma. no sinusitis.   She describes constant left sided headache and neck pain.Marland Kitchen "I dont feel good, sinus pain and left ear pain . "  Afebrile but sweating/cool.  Reviewed Health History In EMR: Yes  Reviewed Medications In EMR: Yes  Reviewed Allergies In EMR: Yes  Reviewed Surgeries / Procedures: Yes  Date of Onset of Symptoms: 06/17/2014  Guideline(s) Used:  Headache  Disposition Per Guideline:   See Today in Office  Reason For Disposition Reached:   Patient wants to be seen  Advice Given:  Call Back If:  You become worse.  Rest:   Lie down in a dark, quiet place and try to relax. Close your eyes and imagine your entire body relaxing.  Apply Cold to the Area:   Apply a cold wet washcloth or cold pack to the forehead for 20 minutes.  Pain Medicines:  For pain relief, you can take either acetaminophen, ibuprofen, or naproxen.  They are over-the-counter (OTC) pain drugs. You can buy them at the drugstore.  Patient Will Follow Care Advice:  YES  Appointment Scheduled:  06/17/2014 12:00:00 Appointment Scheduled Provider:  [facility])

## 2014-06-17 NOTE — Telephone Encounter (Signed)
Dr. Lorelei Pont feels patient should been seen by Dr. Glori Bickers tomorrow.  Appointment rescheduled to 06/18/2014 at 9:30 am.  Ms. Winner aware of appointment change.

## 2014-06-18 ENCOUNTER — Ambulatory Visit (INDEPENDENT_AMBULATORY_CARE_PROVIDER_SITE_OTHER): Payer: Commercial Managed Care - HMO | Admitting: Family Medicine

## 2014-06-18 ENCOUNTER — Telehealth: Payer: Self-pay | Admitting: Family Medicine

## 2014-06-18 ENCOUNTER — Encounter: Payer: Self-pay | Admitting: Family Medicine

## 2014-06-18 VITALS — BP 122/68 | HR 86 | Temp 98.2°F | Ht 60.0 in | Wt 185.8 lb

## 2014-06-18 DIAGNOSIS — R51 Headache: Secondary | ICD-10-CM

## 2014-06-18 DIAGNOSIS — H9202 Otalgia, left ear: Secondary | ICD-10-CM

## 2014-06-18 DIAGNOSIS — R519 Headache, unspecified: Secondary | ICD-10-CM

## 2014-06-18 DIAGNOSIS — D164 Benign neoplasm of bones of skull and face: Secondary | ICD-10-CM | POA: Insufficient documentation

## 2014-06-18 LAB — SEDIMENTATION RATE: Sed Rate: 11 mm/hr (ref 0–22)

## 2014-06-18 MED ORDER — HYDROCODONE-ACETAMINOPHEN 5-325 MG PO TABS: 1.0000 | ORAL_TABLET | ORAL | Status: DC | PRN

## 2014-06-18 NOTE — Assessment & Plan Note (Signed)
Pain on L side Got better and then worse  Also neck /cervical pain and L ear pain  ESR today to r/o TA Considering ENT ref   Seeing ortho on 11/3 as well for spinal disc dz (cervical as well) She will update if worse

## 2014-06-18 NOTE — Assessment & Plan Note (Signed)
Left ear  This improved and then greatly worsened after tx of sinusitis  CT head showed small osteoma in R frontal sinus (not the area she is hurting)  Nl exam today  Has some temporal tenderness ? If poss TA- stat ESR today - and will tx if pos  Considering ENT ref as well

## 2014-06-18 NOTE — Assessment & Plan Note (Signed)
Not causing her pain -on other side  Was incidental finding Will monitor-likely get opinion of ENT

## 2014-06-18 NOTE — Progress Notes (Signed)
Pre visit review using our clinic review tool, if applicable. No additional management support is needed unless otherwise documented below in the visit note. 

## 2014-06-18 NOTE — Patient Instructions (Signed)
Take the hydrocodone as needed -watch out for sedation and also constipation from this  Doing a stat lab today to see if you have a condition called Temporal Arteritis-we will call with result  Then we will likely refer you to ENT for this and also the CT scan (osteoma)  The osteoma is not causing your pain-it is on the other side and small    If symptoms become worse please let us know or go to ER

## 2014-06-18 NOTE — Telephone Encounter (Signed)
Urgent ref to ENT for L ear pain and facial pain

## 2014-06-18 NOTE — Progress Notes (Signed)
Subjective:    Patient ID: Erin Beck, female    DOB: 07/30/1941, 73 y.o.   MRN: [id number]  HPI Here for headache and ear pain  This improved briefly with augmentin and then  Started back Wednesday night - L side of head and ear -rad to top of both shoulders and then top of her head  Could not get comfortable  Has taken her augmentin  Worse after she rolled over in bed - worse pain over shoulder/ back   L eye- perhaps a little blurry  Feels twitchy lid also    No rash   Felt a little nauseated  No vomiting Does feel a little dizzy   Has appt Nov 3rd with Xu for her back and neck pain  Still back and leg pain   Patient Active Problem List   Diagnosis Date Noted  . Local reaction to immunization 06/16/2014  . Chronic neck and back pain 06/15/2014  . Acute ear pain 06/10/2014  . Cephalalgia 06/10/2014  . Right shoulder pain 03/12/2014  . Vitreomacular adhesion 05/05/2013  . Unspecified asthma(493.90) 04/23/2013  . Encounter for Medicare annual wellness exam 03/03/2013  . Personal history of colonic polyps 03/03/2013  . Post-menopausal 10/17/2012  . Obesity 10/31/2011  . Other screening mammogram 10/31/2011  . IBS (irritable bowel syndrome) 08/10/2011  . GERD 07/31/2010  . ALLERGIC RHINITIS 12/05/2007  . HYPERTENSION, BENIGN ESSENTIAL 02/05/2007  . TOBACCO USE, QUIT 02/05/2007  . Diabetes type 2, uncontrolled 01/31/2007  . Hyperlipidemia 01/31/2007  . ANXIETY 01/31/2007  . OSTEOARTHRITIS 01/31/2007  . DIVERTICULITIS, HX OF 01/31/2007   Past Medical History  Diagnosis Date  . Anxiety   . Diabetes mellitus     type II  . Diverticulitis     Hx of  . Hyperlipidemia   . Arthritis     OA  . Osteopenia   . GERD (gastroesophageal reflux disease)   . Hypertension   . Allergic rhinitis   . IBS (irritable bowel syndrome)   . Hematuria     HX OF MICROSCOPIC BLOOD IN URINE AND HX OF CYST ON ONE KIDNEY--BUT NO KIDNEY DISEASE  . Asthma    Past Surgical  History  Procedure Laterality Date  . Abdominal hysterectomy    . Cholecystectomy    . Knee arthroscopy  1990    chondromalacia   . Joint replacement  2000    LEFT TOTAL KNEE ARTHROPLASTY  . Total knee arthroplasty  12/05/2011    Procedure: TOTAL KNEE ARTHROPLASTY;  Surgeon: Magnus Sinning, MD;  Location: [facility];  Service: Orthopedics;  Laterality: Right;  . Appendectomy      per pt  . Eye surgery      BILATERAL CATARACTS REMOVED   History  Substance Use Topics  . Smoking status: Former Smoker -- 1.00 packs/day for 48 years    Quit date: 08/20/2009  . Smokeless tobacco: Never Used  . Alcohol Use: No   Family History  Problem Relation Age of Onset  . Diabetes Father   . Diabetes Brother    Allergies  Allergen Reactions  . Naproxen Rash    Trouble breathing  . Naproxen Sodium Rash    Trouble breathing  . Aspirin     REACTION: burns stomach PT STATES SHE DOES TOLERATE THE 81 MG ASPIRIN DAILY  . Atorvastatin     REACTION: muscle pain  . Buspar [Buspirone Hcl]     Multiple side eff  . Metformin     REACTION: Diarrhea  .  Metformin And Related Diarrhea  . Simvastatin     REACTION: GI side eff and swelling  . Sulfa Antibiotics Other (See Comments)    Severe joint pain  . Sulfonamide Derivatives     REACTION: reaction not known   Current Outpatient Prescriptions on File Prior to Visit  Medication Sig Dispense Refill  . ACCU-CHEK FASTCLIX LANCETS MISC Use to test blood sugar 4 times daily as instructed. Dx code: E11.65  400 each  3  . acetaminophen (TYLENOL) 325 MG tablet Take 2 tablets (650 mg total) by mouth every 6 (six) hours as needed.      Marland Kitchen amoxicillin-clavulanate (AUGMENTIN) 875-125 MG per tablet Take 1 tablet by mouth 2 (two) times daily.  20 tablet  0  . aspirin 81 MG tablet Take 81 mg by mouth daily.      . Blood Glucose Monitoring Suppl (ACCU-CHEK NANO SMARTVIEW) W/DEVICE KIT Use to test blood sugar daily.  1 kit  0  . glipiZIDE (GLUCOTROL XL) 10 MG 24 hr  tablet Take 1 tablet (10 mg total) by mouth daily.  90 tablet  3  . glucose blood (ACCU-CHEK SMARTVIEW) test strip Use to test blood sugar 4 times daily as instructed. Dx code: E11.65  400 each  3  . glucose blood test strip 1 each 2 (two) times daily. Check blood sugar twice daily and as needed.      . hydrochlorothiazide (HYDRODIURIL) 25 MG tablet TAKE 1 TABLET BY MOUTH DAILY  90 tablet  3  . HYDROcodone-acetaminophen (NORCO/VICODIN) 5-325 MG per tablet Take 1 tablet by mouth every 4 (four) hours as needed for moderate pain.  24 tablet  0  . Lancets (FREESTYLE) lancets 2 (two) times daily. Check blood sugar twice daily and as needed.      Marland Kitchen lisinopril (PRINIVIL,ZESTRIL) 10 MG tablet TABLET BY MOUTH EVERY DAY  90 tablet  3  . metFORMIN (GLUCOPHAGE-XR) 500 MG 24 hr tablet Take 1 tablet (500 mg total) by mouth 2 (two) times daily with a meal.  180 tablet  3  . omeprazole (PRILOSEC) 20 MG capsule Take 1 capsule (20 mg total) by mouth daily.  90 capsule  3  . PROAIR HFA 108 (90 BASE) MCG/ACT inhaler INHALE 2 PUFFS INTO THE LUNGS EVERY 6 (SIX) HOURS AS NEEDED.  8.5 each  2   No current facility-administered medications on file prior to visit.    Review of Systems    Review of Systems  Constitutional: Negative for fever, appetite change,  and unexpected weight change.  Eyes: Negative for pain and pos for blurry vision intermittently  ENT pos for L ear and facial pain / neg for purulent nasal discharge  Respiratory: Negative for cough and shortness of breath.   Cardiovascular: Negative for cp or palpitations    Gastrointestinal: Negative for nausea, diarrhea and constipation.  Genitourinary: Negative for urgency and frequency.  Skin: Negative for pallor or rash   Neurological: Negative for weakness, light-headedness, numbness and pos for headaches.  Hematological: Negative for adenopathy. Does not bruise/bleed easily.  Psychiatric/Behavioral: Negative for dysphoric mood. The patient is not  nervous/anxious.      Objective:   Physical Exam  Constitutional: She is oriented to person, place, and time. She appears well-developed and well-nourished. No distress.  HENT:  Head: Normocephalic and atraumatic.  Right Ear: External ear normal.  Left Ear: External ear normal.  Mouth/Throat: Oropharynx is clear and moist. No oropharyngeal exudate.  Nares are boggy L facial tenderness (maxilllary) and  temporal tenderness No rash noted   Eyes: Conjunctivae and EOM are normal. Pupils are equal, round, and reactive to light. Right eye exhibits no discharge. Left eye exhibits no discharge.  Neck: Normal range of motion. Neck supple.  Cardiovascular: Normal rate, regular rhythm and normal heart sounds.   Pulmonary/Chest: Effort normal and breath sounds normal. No respiratory distress. She has no wheezes. She has no rales.  Musculoskeletal: She exhibits no edema.  Lymphadenopathy:    She has no cervical adenopathy.  Neurological: She is alert and oriented to person, place, and time. She has normal reflexes. No cranial nerve deficit. She exhibits normal muscle tone. Coordination normal.  Skin: Skin is warm and dry. No rash noted. No erythema. No pallor.  Psychiatric: She has a normal mood and affect.  Pt is tearful due to pain  Pleasant and talkative           Assessment & Plan:   Problem List Items Addressed This Visit      Unprioritized   Acute ear pain - Primary    Left ear  This improved and then greatly worsened after tx of sinusitis  CT head showed small osteoma in R frontal sinus (not the area she is hurting)  Nl exam today  Has some temporal tenderness ? If poss TA- stat ESR today - and will tx if pos  Considering ENT ref as well     Relevant Orders      Sedimentation Rate (Completed)   Cephalalgia    Pain on L side Got better and then worse  Also neck /cervical pain and L ear pain  ESR today to r/o TA Considering ENT ref   Seeing ortho on 11/3 as well for  spinal disc dz (cervical as well) She will update if worse     Relevant Medications      HYDROcodone-acetaminophen (NORCO/VICODIN) 5-325 MG per tablet   Other Relevant Orders      Sedimentation Rate (Completed)   Osteoma of nasal sinus    Not causing her pain -on other side  Was incidental finding Will monitor-likely get opinion of ENT

## 2014-06-29 ENCOUNTER — Other Ambulatory Visit: Payer: Self-pay | Admitting: Orthopaedic Surgery

## 2014-06-29 DIAGNOSIS — M542 Cervicalgia: Secondary | ICD-10-CM

## 2014-06-29 DIAGNOSIS — M545 Low back pain: Secondary | ICD-10-CM

## 2014-07-05 ENCOUNTER — Other Ambulatory Visit: Payer: Self-pay

## 2014-07-05 MED ORDER — LISINOPRIL 10 MG PO TABS: ORAL_TABLET | ORAL | Status: DC

## 2014-07-05 MED ORDER — HYDROCHLOROTHIAZIDE 25 MG PO TABS: ORAL_TABLET | ORAL | Status: DC

## 2014-07-05 NOTE — Telephone Encounter (Signed)
Pt left v/m requesting refill HCTZ and lisinopril to Erin Beck; pt notified done.

## 2014-07-06 ENCOUNTER — Ambulatory Visit: Payer: Commercial Managed Care - HMO

## 2014-07-13 ENCOUNTER — Ambulatory Visit
Admission: RE | Admit: 2014-07-13 | Discharge: 2014-07-13 | Disposition: A | Discharge status: Home / Self Care | Payer: Commercial Managed Care - HMO | Source: Ambulatory Visit | Attending: Orthopaedic Surgery | Admitting: Orthopaedic Surgery

## 2014-07-13 DIAGNOSIS — M545 Low back pain: Secondary | ICD-10-CM

## 2014-07-13 DIAGNOSIS — M542 Cervicalgia: Secondary | ICD-10-CM

## 2014-07-21 LAB — HM DIABETES EYE EXAM

## 2014-08-23 ENCOUNTER — Other Ambulatory Visit: Payer: Self-pay | Admitting: *Deleted

## 2014-08-23 ENCOUNTER — Telehealth: Payer: Self-pay | Admitting: Internal Medicine

## 2014-08-23 ENCOUNTER — Telehealth: Payer: Self-pay

## 2014-08-23 MED ORDER — GLIPIZIDE ER 10 MG PO TB24: 10.0000 mg | ORAL_TABLET | Freq: Every day | ORAL | Status: DC

## 2014-08-23 NOTE — Telephone Encounter (Signed)
Done

## 2014-08-23 NOTE — Telephone Encounter (Signed)
Patient states she needs a refill on her glipizide 90 day supply   [place]   Thank you

## 2014-08-23 NOTE — Telephone Encounter (Signed)
Pt request refill glipizide; pt saw Dr Cruzita Lederer 05/2014 and transferred pt to Dr Arman Filter office for refill.

## 2014-08-24 DIAGNOSIS — M25562 Pain in left knee: Secondary | ICD-10-CM | POA: Diagnosis not present

## 2014-08-24 DIAGNOSIS — M6281 Muscle weakness (generalized): Secondary | ICD-10-CM | POA: Diagnosis not present

## 2014-08-24 DIAGNOSIS — R262 Difficulty in walking, not elsewhere classified: Secondary | ICD-10-CM | POA: Diagnosis not present

## 2014-08-24 DIAGNOSIS — M25561 Pain in right knee: Secondary | ICD-10-CM | POA: Diagnosis not present

## 2014-08-30 DIAGNOSIS — G5621 Lesion of ulnar nerve, right upper limb: Secondary | ICD-10-CM | POA: Diagnosis not present

## 2014-08-30 DIAGNOSIS — M25562 Pain in left knee: Secondary | ICD-10-CM | POA: Diagnosis not present

## 2014-08-30 DIAGNOSIS — M25561 Pain in right knee: Secondary | ICD-10-CM | POA: Diagnosis not present

## 2014-08-31 ENCOUNTER — Other Ambulatory Visit: Payer: Self-pay | Admitting: Orthopaedic Surgery

## 2014-08-31 ENCOUNTER — Ambulatory Visit
Admission: RE | Admit: 2014-08-31 | Discharge: 2014-08-31 | Disposition: A | Discharge status: Home / Self Care | Payer: Commercial Managed Care - HMO | Source: Ambulatory Visit | Attending: Orthopaedic Surgery | Admitting: Orthopaedic Surgery

## 2014-08-31 ENCOUNTER — Telehealth: Payer: Self-pay | Admitting: Family Medicine

## 2014-08-31 DIAGNOSIS — M2170 Unequal limb length (acquired), unspecified site: Secondary | ICD-10-CM

## 2014-08-31 DIAGNOSIS — M21759 Unequal limb length (acquired), unspecified femur: Secondary | ICD-10-CM | POA: Diagnosis not present

## 2014-08-31 NOTE — Telephone Encounter (Signed)
I'm sorry to hear that  I saw the xray that shows the leg length difference - it looks like one leg is a bit longer -that can add to wear and tear for sure

## 2014-08-31 NOTE — Telephone Encounter (Signed)
Patient saw Dr.Xu yesterday.  She wanted you to look at the x-ray they took, if you have time.  She said she's going to have to have knee replacement surgery done again on right knee.

## 2014-09-01 DIAGNOSIS — M25562 Pain in left knee: Secondary | ICD-10-CM | POA: Diagnosis not present

## 2014-09-01 DIAGNOSIS — M6281 Muscle weakness (generalized): Secondary | ICD-10-CM | POA: Diagnosis not present

## 2014-09-01 DIAGNOSIS — M25561 Pain in right knee: Secondary | ICD-10-CM | POA: Diagnosis not present

## 2014-09-01 DIAGNOSIS — R262 Difficulty in walking, not elsewhere classified: Secondary | ICD-10-CM | POA: Diagnosis not present

## 2014-09-01 NOTE — Telephone Encounter (Signed)
Called phone # twice and line was busy

## 2014-09-02 NOTE — Telephone Encounter (Signed)
Called twice and phone was still busy if pt calls back I will advise her that Dr. Glori Bickers did see her xray results

## 2014-09-06 ENCOUNTER — Ambulatory Visit: Payer: Commercial Managed Care - HMO | Admitting: Internal Medicine

## 2014-09-07 ENCOUNTER — Other Ambulatory Visit (HOSPITAL_BASED_OUTPATIENT_CLINIC_OR_DEPARTMENT_OTHER): Payer: Self-pay | Admitting: Orthopaedic Surgery

## 2014-09-07 DIAGNOSIS — M25561 Pain in right knee: Secondary | ICD-10-CM | POA: Diagnosis not present

## 2014-09-07 DIAGNOSIS — R262 Difficulty in walking, not elsewhere classified: Secondary | ICD-10-CM | POA: Diagnosis not present

## 2014-09-07 DIAGNOSIS — M6281 Muscle weakness (generalized): Secondary | ICD-10-CM | POA: Diagnosis not present

## 2014-09-07 DIAGNOSIS — M25562 Pain in left knee: Secondary | ICD-10-CM | POA: Diagnosis not present

## 2014-09-09 ENCOUNTER — Encounter: Payer: Self-pay | Admitting: Internal Medicine

## 2014-09-09 NOTE — Progress Notes (Signed)
Received labs from Dr. Xu ([facility], 08/30/2014): - HbA1c 6.9%, decreased from 8.0%! - CBC with diff normal - ESR 4 - CRP <0.5 Will scan original report 

## 2014-09-13 ENCOUNTER — Encounter (HOSPITAL_BASED_OUTPATIENT_CLINIC_OR_DEPARTMENT_OTHER): Payer: Self-pay | Admitting: *Deleted

## 2014-09-13 ENCOUNTER — Encounter (HOSPITAL_BASED_OUTPATIENT_CLINIC_OR_DEPARTMENT_OTHER)
Admission: RE | Admit: 2014-09-13 | Discharge: 2014-09-13 | Disposition: A | Discharge status: Home / Self Care | Payer: Commercial Managed Care - HMO | Source: Ambulatory Visit | Attending: Orthopaedic Surgery | Admitting: Orthopaedic Surgery

## 2014-09-13 DIAGNOSIS — G5621 Lesion of ulnar nerve, right upper limb: Secondary | ICD-10-CM | POA: Diagnosis not present

## 2014-09-13 DIAGNOSIS — E119 Type 2 diabetes mellitus without complications: Secondary | ICD-10-CM | POA: Diagnosis not present

## 2014-09-13 DIAGNOSIS — Z79899 Other long term (current) drug therapy: Secondary | ICD-10-CM | POA: Diagnosis not present

## 2014-09-13 DIAGNOSIS — J45909 Unspecified asthma, uncomplicated: Secondary | ICD-10-CM | POA: Diagnosis not present

## 2014-09-13 DIAGNOSIS — Z833 Family history of diabetes mellitus: Secondary | ICD-10-CM | POA: Diagnosis not present

## 2014-09-13 DIAGNOSIS — M1990 Unspecified osteoarthritis, unspecified site: Secondary | ICD-10-CM | POA: Diagnosis not present

## 2014-09-13 DIAGNOSIS — E785 Hyperlipidemia, unspecified: Secondary | ICD-10-CM | POA: Diagnosis not present

## 2014-09-13 DIAGNOSIS — Z7982 Long term (current) use of aspirin: Secondary | ICD-10-CM | POA: Diagnosis not present

## 2014-09-13 DIAGNOSIS — K589 Irritable bowel syndrome without diarrhea: Secondary | ICD-10-CM | POA: Diagnosis not present

## 2014-09-13 DIAGNOSIS — Z87891 Personal history of nicotine dependence: Secondary | ICD-10-CM | POA: Diagnosis not present

## 2014-09-13 DIAGNOSIS — I10 Essential (primary) hypertension: Secondary | ICD-10-CM | POA: Diagnosis not present

## 2014-09-13 DIAGNOSIS — K219 Gastro-esophageal reflux disease without esophagitis: Secondary | ICD-10-CM | POA: Diagnosis not present

## 2014-09-13 LAB — BASIC METABOLIC PANEL
ANION GAP: 7 (ref 5–15)
BUN: 11 mg/dL (ref 6–23)
CO2: 31 mmol/L (ref 19–32)
CREATININE: 0.68 mg/dL (ref 0.50–1.10)
Calcium: 9.8 mg/dL (ref 8.4–10.5)
Chloride: 98 mmol/L (ref 96–112)
GFR calc Af Amer: >90 mL/min (ref >90)
GFR calc non Af Amer: 85 mL/min — ABNORMAL LOW (ref >90)
Glucose, Bld: 162 mg/dL — ABNORMAL HIGH (ref 70–99)
POTASSIUM: 4.6 mmol/L (ref 3.5–5.1)
Sodium: 136 mmol/L (ref 135–145)

## 2014-09-13 NOTE — Progress Notes (Signed)
To come in for [id number] 

## 2014-09-14 ENCOUNTER — Ambulatory Visit (INDEPENDENT_AMBULATORY_CARE_PROVIDER_SITE_OTHER): Payer: Commercial Managed Care - HMO | Admitting: Internal Medicine

## 2014-09-14 ENCOUNTER — Encounter: Payer: Self-pay | Admitting: Internal Medicine

## 2014-09-14 VITALS — BP 124/62 | HR 95 | Temp 98.0°F | Resp 12 | Wt 185.0 lb

## 2014-09-14 DIAGNOSIS — E1165 Type 2 diabetes mellitus with hyperglycemia: Secondary | ICD-10-CM | POA: Diagnosis not present

## 2014-09-14 DIAGNOSIS — IMO0002 Reserved for concepts with insufficient information to code with codable children: Secondary | ICD-10-CM

## 2014-09-14 MED ORDER — METFORMIN HCL ER 500 MG PO TB24: ORAL_TABLET | ORAL | Status: DC

## 2014-09-14 MED ORDER — GLIPIZIDE ER 5 MG PO TB24: 5.0000 mg | ORAL_TABLET | Freq: Every day | ORAL | Status: DC

## 2014-09-14 NOTE — Progress Notes (Signed)
Patient ID: Erin Beck, female   DOB: [birth year], 74 y.o.   MRN: [id number]  HPI: Erin Beck is a 74 y.o.-year-old female, returning for f/u for DM2, non-insulin-dependent, dx 2005, uncontrolled, without complications. Last visit 3 mo ago.  She has not been on insulin before, would not like to stick herself.  Last hemoglobin A1c was: Received labs from Dr. Erlinda Hong Randell Beck, 08/30/2014): - HbA1c 6.9%, decreased from 8.0%! Will scan original report Lab Results  Component Value Date   HGBA1C 8.0* 03/09/2014   HGBA1C 7.3* 10/05/2013   HGBA1C 7.5* 07/06/2013   Pt is on a regimen of: - Glipizide XL 10 mg in am - Metformin 500 mg po bid >> Metformin ER  Pt checks her sugars 1-2 a day >> much improved (no log): - am: 170-180 >> 111-154 >> 90-127 >> 100-112 - 2h after b'fast: n/c >> 242, 273 >> 150s >> n/c - before lunch: n/c >> 75-120 - 2h after lunch: n/c >> 200-210 (?) - before dinner: n/c >> 113-153 >> n/c >> 100-130 - 2h after dinner: low 200s >> 153, 161 >> 150-160 >> 120-150 - bedtime: n/c >> 164 >> 120-140 >> 100, 120-140 - nighttime: n/c >> 80 x1 >> 98-105 No lows. Lowest sugar was 70s;  she has hypoglycemia awareness at 80.  Highest sugar was 220 >> 160 >> 210 now.  She goes to tx once a week with her knee.  Pt's meals are: - Breakfast: cereals - Lunch: salads, 1 sandwich, leftovers - Dinner: salmon/chicken, vegetables - Snacks: 1 fruit, crackers - not every day Cut out soft drinks.  - no CKD, last BUN/creatinine:  Lab Results  Component Value Date   BUN 11 09/13/2014   CREATININE 0.68 09/13/2014  On Lisinopril. - last set of lipids: Lab Results  Component Value Date   CHOL 230* 06/08/2014   HDL 30.80* 06/08/2014   LDLCALC 127* 03/09/2014   LDLDIRECT 153.7 06/08/2014   TRIG 275.0* 06/08/2014   CHOLHDL 7 06/08/2014  She was on a statin >> AP.  - last eye exam was in 07/2014. No DR. Had cataracts sx. - no numbness and tingling in her feet.  I reviewed  pt's medications, allergies, PMH, social hx, family hx, and changes were documented in the history of present illness. Otherwise, unchanged from my initial visit note..  ROS: Constitutional: no weight gain/loss, no fatigue, no subjective hyperthermia/hypothermia Eyes: no blurry vision, no xerophthalmia ENT: no sore throat, no nodules palpated in throat, no dysphagia/odynophagia, no hoarseness Cardiovascular: no CP/SOB/palpitations/leg swelling Respiratory: no cough/SOB Gastrointestinal: no N/V/D/C Musculoskeletal: no muscle/joint aches Skin: no rashes, + hair loss Neurological: no tremors/numbness/tingling/dizziness  PE: BP 124/62 mmHg  Pulse 95  Temp(Src) 98 F (36.7 C) (Oral)  Resp 12  Wt 185 lb (83.915 kg)  SpO2 95% Wt Readings from Last 3 Encounters:  09/14/14 185 lb (83.915 kg)  09/13/14 185 lb (83.915 kg)  06/18/14 185 lb 12 oz (84.256 kg)   Constitutional: overweight, in NAD, nasal congestion Eyes: PERRLA, EOMI, no exophthalmos ENT: moist mucous membranes, no thyromegaly, no cervical lymphadenopathy Cardiovascular: RRR, No MRG Respiratory: CTA B Gastrointestinal: abdomen soft, NT, ND, BS+ Musculoskeletal: no deformities, strength intact in all 4 Skin: moist, warm, no rashes Neurological: no tremor with outstretched hands, DTR normal in all 4  ASSESSMENT: 1. DM2, non-insulin-dependent, uncontrolled, without complications  PLAN:  1. Patient with long-standing, uncontrolled diabetes, on oral antidiabetic regimen, now with improved control after switching to Metformin ER. She does have some  sugars in the 75's and she feels she is dropping her sugar faster even to higher levels (in 90-100 range) >> will reduce the Glipizide XL to 5 mg.  - Since sugars much improved, I suggested to:  Patient Instructions  Please increase Metformin ER to 500 mg in am and 1000 mg (2 tablets) with dinner. Decrease Glipizide XL to 5 mg in am.  Please let me know if the sugars are  consistently <80 or >200.  Please return in 3 months with your sugar log.   - continue checking sugars at different times of the day - check 2 times a day, rotating checks - advised for yearly eye exams >> she is Up to date - Return to clinic in 3 mo with sugar log

## 2014-09-14 NOTE — H&P (Signed)
PREOPERATIVE H&P  Chief Complaint: Right cubital tunnel syndrome  HPI: Erin Beck is a 74 y.o. female who presents for surgical treatment of Right cubital tunnel syndrome.  She denies any changes in medical history.  Past Medical History  Diagnosis Date  . Anxiety   . Diabetes mellitus     type II  . Diverticulitis     Hx of  . Hyperlipidemia   . Arthritis     OA  . Osteopenia   . GERD (gastroesophageal reflux disease)   . Hypertension   . Allergic rhinitis   . IBS (irritable bowel syndrome)   . Hematuria     HX OF MICROSCOPIC BLOOD IN URINE AND HX OF CYST ON ONE KIDNEY--BUT NO KIDNEY DISEASE  . Asthma   . Full dentures   . Wears glasses   . Complication of anesthesia     gas bubble rt eye from an old retinal detachment   Past Surgical History  Procedure Laterality Date  . Abdominal hysterectomy    . Cholecystectomy    . Knee arthroscopy  1990    chondromalacia   . Total knee arthroplasty  12/05/2011    Procedure: TOTAL KNEE ARTHROPLASTY;  Surgeon: Magnus Sinning, MD;  Location: [facility];  Service: Orthopedics;  Laterality: Right;  . Appendectomy      per pt  . Joint replacement  2000    LEFT TOTAL KNEE ARTHROPLASTY  . Joint replacement  2013    rt total knee  . Colonoscopy    . Carpal tunnel release      both hands  . Eye surgery      BILATERAL CATARACTS REMOVED  . Eye surgery      gas bubble in rt eye from a retinal detachment   History   Social History  . Marital Status: Married    Spouse Name: N/A    Number of Children: N/A  . Years of Education: N/A   Social History Main Topics  . Smoking status: Former Smoker -- 1.00 packs/day for 48 years    Quit date: 08/20/2009  . Smokeless tobacco: Never Used  . Alcohol Use: No  . Drug Use: No  . Sexual Activity: None   Other Topics Concern  . None   Social History Narrative   Family History  Problem Relation Age of Onset  . Diabetes Father   . Diabetes Brother    Allergies  Allergen  Reactions  . Naproxen Rash    Trouble breathing  . Naproxen Sodium Rash    Trouble breathing  . Aspirin     REACTION: burns stomach PT STATES SHE DOES TOLERATE THE 81 MG ASPIRIN DAILY  . Atorvastatin     REACTION: muscle pain  . Buspar [Buspirone Hcl]     Multiple side eff  . Metformin     REACTION: Diarrhea  . Metformin And Related Diarrhea  . Simvastatin     REACTION: GI side eff and swelling  . Sulfa Antibiotics Other (See Comments)    Severe joint pain  . Sulfonamide Derivatives     REACTION: reaction not known   Prior to Admission medications   Medication Sig Start Date End Date Taking? Authorizing Provider  ACCU-CHEK FASTCLIX LANCETS MISC Use to test blood sugar 4 times daily as instructed. Dx code: E11.65 06/03/14   Philemon Kingdom, MD  acetaminophen (TYLENOL) 325 MG tablet Take 2 tablets (650 mg total) by mouth every 6 (six) hours as needed. 04/23/13   Corinna L  Conley Canal, MD  aspirin 81 MG tablet Take 81 mg by mouth daily.    Historical Provider, MD  Blood Glucose Monitoring Suppl (ACCU-CHEK NANO SMARTVIEW) W/DEVICE KIT Use to test blood sugar daily. 06/03/14   Philemon Kingdom, MD  glipiZIDE (GLUCOTROL XL) 5 MG 24 hr tablet Take 1 tablet (5 mg total) by mouth daily. 09/14/14   Philemon Kingdom, MD  glucose blood (ACCU-CHEK SMARTVIEW) test strip Use to test blood sugar 4 times daily as instructed. Dx code: E11.65 06/03/14   Philemon Kingdom, MD  glucose blood test strip 1 each 2 (two) times daily. Check blood sugar twice daily and as needed.    Historical Provider, MD  hydrochlorothiazide (HYDRODIURIL) 25 MG tablet TAKE 1 TABLET BY MOUTH DAILY 07/05/14   Abner Greenspan, MD  Lancets (FREESTYLE) lancets 2 (two) times daily. Check blood sugar twice daily and as needed.    Historical Provider, MD  lisinopril (PRINIVIL,ZESTRIL) 10 MG tablet TABLET BY MOUTH EVERY DAY 07/05/14   Abner Greenspan, MD  metFORMIN (GLUCOPHAGE-XR) 500 MG 24 hr tablet Take by mouth 1 tablet in am and 2 tablets  in pm 09/14/14   Philemon Kingdom, MD  omeprazole (PRILOSEC) 20 MG capsule Take 1 capsule (20 mg total) by mouth daily. 09/08/13   Abner Greenspan, MD  PROAIR HFA 108 (90 BASE) MCG/ACT inhaler INHALE 2 PUFFS INTO THE LUNGS EVERY 6 (SIX) HOURS AS NEEDED. 01/26/14   Abner Greenspan, MD     Positive ROS: All other systems have been reviewed and were otherwise negative with the exception of those mentioned in the HPI and as above.  Physical Exam: General: Alert, no acute distress Cardiovascular: No pedal edema Respiratory: No cyanosis, no use of accessory musculature GI: abdomen soft Skin: No lesions in the area of chief complaint Neurologic: Sensation intact distally Psychiatric: Patient is competent for consent with normal mood and affect Lymphatic: no lymphedema  MUSCULOSKELETAL: exam stable  Assessment: Right cubital tunnel syndrome  Plan: Plan for Procedure(s): Right cubital tunnel release possible transposition of ulnar nerve  The risks benefits and alternatives were discussed with the patient including but not limited to the risks of nonoperative treatment, versus surgical intervention including infection, bleeding, nerve injury,  blood clots, cardiopulmonary complications, morbidity, mortality, among others, and they were willing to proceed.   Marianna Payment, MD   09/14/2014 12:53 PM

## 2014-09-14 NOTE — Patient Instructions (Signed)
Patient Instructions  Please increase Metformin ER to 500 mg in am and 1000 mg (2 tablets) with dinner. Decrease Glipizide XL to 5 mg in am.  Please let me know if the sugars are consistently <80 or >200.  Please return in 3 months with your sugar log.

## 2014-09-15 ENCOUNTER — Ambulatory Visit (HOSPITAL_BASED_OUTPATIENT_CLINIC_OR_DEPARTMENT_OTHER): Payer: Commercial Managed Care - HMO | Admitting: Anesthesiology

## 2014-09-15 ENCOUNTER — Ambulatory Visit (HOSPITAL_BASED_OUTPATIENT_CLINIC_OR_DEPARTMENT_OTHER)
Admission: RE | Admit: 2014-09-15 | Discharge: 2014-09-15 | Disposition: A | Discharge status: Home / Self Care | Payer: Commercial Managed Care - HMO | Source: Ambulatory Visit | Attending: Orthopaedic Surgery | Admitting: Orthopaedic Surgery

## 2014-09-15 ENCOUNTER — Encounter (HOSPITAL_BASED_OUTPATIENT_CLINIC_OR_DEPARTMENT_OTHER)
Admission: RE | Disposition: A | Discharge status: Home / Self Care | Payer: Self-pay | Source: Ambulatory Visit | Attending: Orthopaedic Surgery

## 2014-09-15 ENCOUNTER — Encounter (HOSPITAL_BASED_OUTPATIENT_CLINIC_OR_DEPARTMENT_OTHER): Payer: Self-pay | Admitting: Anesthesiology

## 2014-09-15 DIAGNOSIS — I10 Essential (primary) hypertension: Secondary | ICD-10-CM | POA: Diagnosis not present

## 2014-09-15 DIAGNOSIS — K589 Irritable bowel syndrome without diarrhea: Secondary | ICD-10-CM | POA: Diagnosis not present

## 2014-09-15 DIAGNOSIS — G5621 Lesion of ulnar nerve, right upper limb: Secondary | ICD-10-CM | POA: Diagnosis not present

## 2014-09-15 DIAGNOSIS — Z79899 Other long term (current) drug therapy: Secondary | ICD-10-CM | POA: Insufficient documentation

## 2014-09-15 DIAGNOSIS — J45909 Unspecified asthma, uncomplicated: Secondary | ICD-10-CM | POA: Insufficient documentation

## 2014-09-15 DIAGNOSIS — Z87891 Personal history of nicotine dependence: Secondary | ICD-10-CM | POA: Insufficient documentation

## 2014-09-15 DIAGNOSIS — E119 Type 2 diabetes mellitus without complications: Secondary | ICD-10-CM | POA: Insufficient documentation

## 2014-09-15 DIAGNOSIS — E785 Hyperlipidemia, unspecified: Secondary | ICD-10-CM | POA: Insufficient documentation

## 2014-09-15 DIAGNOSIS — M1990 Unspecified osteoarthritis, unspecified site: Secondary | ICD-10-CM | POA: Insufficient documentation

## 2014-09-15 DIAGNOSIS — Z7982 Long term (current) use of aspirin: Secondary | ICD-10-CM | POA: Insufficient documentation

## 2014-09-15 DIAGNOSIS — K219 Gastro-esophageal reflux disease without esophagitis: Secondary | ICD-10-CM | POA: Insufficient documentation

## 2014-09-15 DIAGNOSIS — Z833 Family history of diabetes mellitus: Secondary | ICD-10-CM | POA: Insufficient documentation

## 2014-09-15 HISTORY — PX: ULNAR NERVE TRANSPOSITION: SHX2595

## 2014-09-15 HISTORY — DX: Presence of spectacles and contact lenses: Z97.3

## 2014-09-15 HISTORY — DX: Complete loss of teeth, unspecified cause, unspecified class: K08.109

## 2014-09-15 HISTORY — DX: Other complications of anesthesia, initial encounter: T88.59XA

## 2014-09-15 HISTORY — DX: Adverse effect of unspecified anesthetic, initial encounter: T41.45XA

## 2014-09-15 HISTORY — DX: Complete loss of teeth, unspecified cause, unspecified class: Z97.2

## 2014-09-15 LAB — GLUCOSE, CAPILLARY
Glucose-Capillary: 132 mg/dL — ABNORMAL HIGH (ref 70–99)
Glucose-Capillary: 122 mg/dL — ABNORMAL HIGH (ref 70–99)

## 2014-09-15 LAB — POCT HEMOGLOBIN-HEMACUE: Hemoglobin: 14.4 g/dL (ref 12.0–15.0)

## 2014-09-15 SURGERY — ULNAR NERVE DECOMPRESSION/TRANSPOSITION
Anesthesia: General | Site: Elbow | Laterality: Right

## 2014-09-15 MED ORDER — MIDAZOLAM HCL 2 MG/2ML IJ SOLN: 1.0000 mg | INTRAMUSCULAR | Status: DC | PRN

## 2014-09-15 MED ORDER — HYDROCODONE-ACETAMINOPHEN 5-325 MG PO TABS: 1.0000 | ORAL_TABLET | Freq: Four times a day (QID) | ORAL | Status: DC | PRN

## 2014-09-15 MED ORDER — FENTANYL CITRATE 0.05 MG/ML IJ SOLN
INTRAMUSCULAR | Status: AC
  Filled 2014-09-15: qty 2

## 2014-09-15 MED ORDER — FENTANYL CITRATE 0.05 MG/ML IJ SOLN
25.0000 ug | INTRAMUSCULAR | Status: DC | PRN
  Administered 2014-09-15: 50 ug via INTRAVENOUS
  Administered 2014-09-15 (×2): 25 ug via INTRAVENOUS
  Administered 2014-09-15: 50 ug via INTRAVENOUS

## 2014-09-15 MED ORDER — ONDANSETRON HCL 4 MG/2ML IJ SOLN
INTRAMUSCULAR | Status: DC | PRN
  Administered 2014-09-15: 4 mg via INTRAVENOUS

## 2014-09-15 MED ORDER — MEPERIDINE HCL 25 MG/ML IJ SOLN: 6.2500 mg | INTRAMUSCULAR | Status: DC | PRN

## 2014-09-15 MED ORDER — BUPIVACAINE HCL (PF) 0.25 % IJ SOLN
INTRAMUSCULAR | Status: AC
  Filled 2014-09-15: qty 30

## 2014-09-15 MED ORDER — PROPOFOL 10 MG/ML IV EMUL
INTRAVENOUS | Status: AC
  Filled 2014-09-15: qty 50

## 2014-09-15 MED ORDER — FENTANYL CITRATE 0.05 MG/ML IJ SOLN: 50.0000 ug | INTRAMUSCULAR | Status: DC | PRN

## 2014-09-15 MED ORDER — PROMETHAZINE HCL 25 MG/ML IJ SOLN: 6.2500 mg | INTRAMUSCULAR | Status: DC | PRN

## 2014-09-15 MED ORDER — MIDAZOLAM HCL 2 MG/2ML IJ SOLN
INTRAMUSCULAR | Status: AC
  Filled 2014-09-15: qty 2

## 2014-09-15 MED ORDER — LIDOCAINE HCL (PF) 1 % IJ SOLN
INTRAMUSCULAR | Status: AC
  Filled 2014-09-15: qty 30

## 2014-09-15 MED ORDER — FENTANYL CITRATE 0.05 MG/ML IJ SOLN
INTRAMUSCULAR | Status: AC
  Filled 2014-09-15: qty 4

## 2014-09-15 MED ORDER — LIDOCAINE HCL (CARDIAC) 20 MG/ML IV SOLN
INTRAVENOUS | Status: DC | PRN
  Administered 2014-09-15: 80 mg via INTRAVENOUS

## 2014-09-15 MED ORDER — CEFAZOLIN SODIUM-DEXTROSE 2-3 GM-% IV SOLR
2.0000 g | INTRAVENOUS | Status: AC
  Administered 2014-09-15: 2 g via INTRAVENOUS

## 2014-09-15 MED ORDER — PROPOFOL 10 MG/ML IV BOLUS
INTRAVENOUS | Status: DC | PRN
  Administered 2014-09-15: 150 mg via INTRAVENOUS
  Administered 2014-09-15: 20 mg via INTRAVENOUS
  Administered 2014-09-15: 30 mg via INTRAVENOUS

## 2014-09-15 MED ORDER — OXYCODONE HCL 5 MG PO TABS
5.0000 mg | ORAL_TABLET | Freq: Once | ORAL | Status: AC | PRN
  Administered 2014-09-15: 5 mg via ORAL

## 2014-09-15 MED ORDER — LACTATED RINGERS IV SOLN
INTRAVENOUS | Status: DC
  Administered 2014-09-15 (×2): via INTRAVENOUS

## 2014-09-15 MED ORDER — OXYCODONE HCL 5 MG/5ML PO SOLN: 5.0000 mg | Freq: Once | ORAL | Status: AC | PRN

## 2014-09-15 MED ORDER — BUPIVACAINE HCL (PF) 0.25 % IJ SOLN
INTRAMUSCULAR | Status: DC | PRN
  Administered 2014-09-15: 10 mL

## 2014-09-15 MED ORDER — CEFAZOLIN SODIUM 1-5 GM-% IV SOLN
INTRAVENOUS | Status: AC
  Filled 2014-09-15: qty 100

## 2014-09-15 MED ORDER — OXYCODONE HCL 5 MG PO TABS
ORAL_TABLET | ORAL | Status: AC
  Filled 2014-09-15: qty 1

## 2014-09-15 MED ORDER — 0.9 % SODIUM CHLORIDE (POUR BTL) OPTIME
TOPICAL | Status: DC | PRN
  Administered 2014-09-15: 300 mL

## 2014-09-15 MED ORDER — MIDAZOLAM HCL 5 MG/5ML IJ SOLN
INTRAMUSCULAR | Status: DC | PRN
  Administered 2014-09-15: 1 mg via INTRAVENOUS

## 2014-09-15 SURGICAL SUPPLY — 55 items
BANDAGE ELASTIC 3 VELCRO ST LF (GAUZE/BANDAGES/DRESSINGS) ×3 IMPLANT
BLADE SURG 15 STRL LF DISP TIS (BLADE) ×1 IMPLANT
BLADE SURG 15 STRL SS (BLADE) ×2
BNDG ESMARK 4X9 LF (GAUZE/BANDAGES/DRESSINGS) ×3 IMPLANT
BRUSH SCRUB EZ PLAIN DRY (MISCELLANEOUS) ×3 IMPLANT
CLOSURE WOUND 1/4X4 (GAUZE/BANDAGES/DRESSINGS)
CORDS BIPOLAR (ELECTRODE) ×3 IMPLANT
COVER BACK TABLE 60X90IN (DRAPES) ×3 IMPLANT
COVER MAYO STAND STRL (DRAPES) ×3 IMPLANT
CUFF TOURN SGL LL 18 NRW (TOURNIQUET CUFF) ×3 IMPLANT
CUFF TOURNIQUET SINGLE 18IN (TOURNIQUET CUFF) IMPLANT
DECANTER SPIKE VIAL GLASS SM (MISCELLANEOUS) ×3 IMPLANT
DRAPE EXTREMITY T 121X128X90 (DRAPE) ×3 IMPLANT
DRAPE SURG 17X23 STRL (DRAPES) ×3 IMPLANT
DURAPREP 26ML APPLICATOR (WOUND CARE) IMPLANT
GAUZE SPONGE 4X4 12PLY STRL (GAUZE/BANDAGES/DRESSINGS) ×3 IMPLANT
GAUZE SPONGE 4X4 16PLY XRAY LF (GAUZE/BANDAGES/DRESSINGS) ×3 IMPLANT
GAUZE XEROFORM 1X8 LF (GAUZE/BANDAGES/DRESSINGS) ×3 IMPLANT
GLOVE BIO SURGEON STRL SZ7 (GLOVE) ×3 IMPLANT
GLOVE EXAM NITRILE MD LF STRL (GLOVE) ×3 IMPLANT
GLOVE NEODERM STRL 7.5 LF PF (GLOVE) ×1 IMPLANT
GLOVE SURG NEODERM 7.5  LF PF (GLOVE) ×2
GLOVE SURG SYN 7.5  E (GLOVE) ×2
GLOVE SURG SYN 7.5 E (GLOVE) ×1 IMPLANT
GOWN PREVENTION PLUS XLARGE (GOWN DISPOSABLE) ×3 IMPLANT
GOWN STRL REIN XL XLG (GOWN DISPOSABLE) ×3 IMPLANT
LOOP VESSEL MAXI BLUE (MISCELLANEOUS) ×3 IMPLANT
NEEDLE HYPO 25X1 1.5 SAFETY (NEEDLE) ×3 IMPLANT
NS IRRIG 1000ML POUR BTL (IV SOLUTION) ×3 IMPLANT
PACK BASIN DAY SURGERY FS (CUSTOM PROCEDURE TRAY) ×3 IMPLANT
PAD CAST 3X4 CTTN HI CHSV (CAST SUPPLIES) ×1 IMPLANT
PADDING CAST ABS 4INX4YD NS (CAST SUPPLIES)
PADDING CAST ABS COTTON 4X4 ST (CAST SUPPLIES) IMPLANT
PADDING CAST COTTON 3X4 STRL (CAST SUPPLIES) ×2
PADDING CAST SYNTHETIC 3 NS LF (CAST SUPPLIES)
PADDING CAST SYNTHETIC 3X4 NS (CAST SUPPLIES) IMPLANT
PADDING CAST SYNTHETIC 4 (CAST SUPPLIES)
PADDING CAST SYNTHETIC 4X4 STR (CAST SUPPLIES) IMPLANT
SLEEVE SCD COMPRESS KNEE MED (MISCELLANEOUS) ×3 IMPLANT
SLING ARM LRG ADULT FOAM STRAP (SOFTGOODS) ×3 IMPLANT
SPLINT PLASTER CAST XFAST 3X15 (CAST SUPPLIES) IMPLANT
SPLINT PLASTER XTRA FASTSET 3X (CAST SUPPLIES)
STOCKINETTE 4X48 STRL (DRAPES) ×3 IMPLANT
STRIP CLOSURE SKIN 1/4X4 (GAUZE/BANDAGES/DRESSINGS) IMPLANT
SUT ETHILON 4 0 PS 2 18 (SUTURE) ×6 IMPLANT
SUT VIC AB 2-0 SH 27 (SUTURE) ×2
SUT VIC AB 2-0 SH 27XBRD (SUTURE) ×1 IMPLANT
SUT VIC AB 4-0 P-3 18XBRD (SUTURE) IMPLANT
SUT VIC AB 4-0 P2 18 (SUTURE) IMPLANT
SUT VIC AB 4-0 P3 18 (SUTURE)
SYR BULB 3OZ (MISCELLANEOUS) ×3 IMPLANT
SYR CONTROL 10ML LL (SYRINGE) ×3 IMPLANT
TOWEL OR 17X24 6PK STRL BLUE (TOWEL DISPOSABLE) ×6 IMPLANT
TRAY DSU PREP LF (CUSTOM PROCEDURE TRAY) ×3 IMPLANT
UNDERPAD 30X30 INCONTINENT (UNDERPADS AND DIAPERS) ×3 IMPLANT

## 2014-09-15 NOTE — Anesthesia Postprocedure Evaluation (Signed)
Anesthesia Post Note  Patient: Erin Beck  Procedure(s) Performed: Procedure(s) (LRB): Right cubital tunnel release with transposition of ulnar nerve (Right)  Anesthesia type: General  Patient location: PACU  Post pain: Pain level controlled  Post assessment: Post-op Vital signs reviewed  Last Vitals: BP 141/64 mmHg  Pulse 79  Temp(Src) 36.6 C (Oral)  Resp 20  Ht 5\' 1"  (1.549 m)  Wt 185 lb (83.915 kg)  BMI 34.97 kg/m2  SpO2 97%  Post vital signs: Reviewed  Level of consciousness: sedated  Complications: No apparent anesthesia complications

## 2014-09-15 NOTE — Op Note (Addendum)
   DATE OF SURGERY: 09/15/2014  PREOPERATIVE DIAGNOSIS: Cubital tunnel syndrome, right  POSTOPERATIVE DIAGNOSIS: same.  PROCEDURE: Right ulnar nerve neurolysis at elbow with subcutaneous transposition.  CPT [phone]  SURGEON: Surgeon(s) and Role:    * [name], MD - Primary  ANESTHESIA: General  TOURNIQUET TIME: 20 minutes  BLOOD LOSS: Minimal.  COMPLICATIONS: None.  PATHOLOGY: None.  TIME OUT: Performed prior to start of procedure.  INDICATIONS: The patient was a 74 y.o. female who presented with cubital tunnel syndrome failing nonsurgical managements, indicated for surgery.  DESCRIPTION OF PROCEDURE: The patient was identified in the preoperative holding area.  The operative site was marked by the surgeon and confirmed by the patient.  He was brought back to the operating room.  Anesthesia was induced by the anesthesia team.  A well padded nonsterile tourniquet was placed. The operative extremity was prepped and draped in standard sterile fashion. A medial elbow incision was made in between the medial epicondyle and olecranon. The medial antebrachial cutaneous nerve was exposed and retracted and protected. The ulnar nerve was identified in between the medial intermuscular septum and medial head of triceps. The fascia crossing the medial head of the triceps and intermuscular septum was released about 10 cm proximally to the elbow. Distally, Osborne's ligament was released from its posterior edge. We followed the ulnar nerve distally. The fascia of the flexor carpi ulnaris was divided and deep aponeurosis was also released. A subcutaneous transposition of the ulnar nerve was performed due to subluxation of the nerve with elbow flexion.  The patient had adequate adipose tissue to give enough cushion for the nerve.  The elbow was ranged and there were no points to kinking or stenosis.   At this point, local infiltration with 0.25% of Sensorcaine was given. The tourniquet was deflated.   Hemostasis was achieved. The wound was irrigated and closed using 2-0 vicryl and 4-0 nylon sutures. Sterile dressing applied. The patient was transferred to the recovery room in stable condition after all counts were correct.  POSTOPERATIVE PLAN: To start nerve gliding exercises, avoid heavy lifting for four weeks.  Azucena Cecil, MD [place] 9:35 AM

## 2014-09-15 NOTE — Transfer of Care (Signed)
Immediate Anesthesia Transfer of Care Note  Patient: Erin Beck  Procedure(s) Performed: Procedure(s): Right cubital tunnel release with transposition of ulnar nerve (Right)  Patient Location: PACU  Anesthesia Type:General  Level of Consciousness: awake, sedated and patient cooperative  Airway & Oxygen Therapy: Patient Spontanous Breathing and Patient connected to face mask oxygen  Post-op Assessment: Report given to PACU RN and Post -op Vital signs reviewed and stable  Post vital signs: Reviewed and stable  Complications: No apparent anesthesia complications

## 2014-09-15 NOTE — Discharge Instructions (Signed)
Postoperative instructions:  Weightbearing: Light activity, no lifting allowed  Keep your dressing and/or splint clean and dry at all times.  You can remove your dressing on post-operative day #3 and change with a dry/sterile dressing or Band-Aids as needed thereafter.    Incision instructions:  Do not soak your incision for 3 weeks after surgery.  If the incision gets wet, pat dry and do not scrub the incision.  Pain control:  You have been given a prescription to be taken as directed for post-operative pain control.  In addition, elevate the operative extremity above the heart at all times to prevent swelling and throbbing pain.  Take over-the-counter Colace, 100mg  by mouth twice a day while taking narcotic pain medications to help prevent constipation.  Follow up appointments: 1) 10-14 days for suture removal and wound check. 2) Dr. Erlinda Hong as scheduled.   -------------------------------------------------------------------------------------------------------------  After Surgery Pain Control:  After your surgery, post-surgical discomfort or pain is likely. This discomfort can last several days to a few weeks. At certain times of the day your discomfort may be more intense.  Did you receive a nerve block?  A nerve block can provide pain relief for one hour to two days after your surgery. As long as the nerve block is working, you will experience little or no sensation in the area the surgeon operated on.  As the nerve block wears off, you will begin to experience pain or discomfort. It is very important that you begin taking your prescribed pain medication before the nerve block fully wears off. Treating your pain at the first sign of the block wearing off will ensure your pain is better controlled and more tolerable when full-sensation returns. Do not wait until the pain is intolerable, as the medicine will be less effective. It is better to treat pain in advance than to try and catch up.    General Anesthesia:  If you did not receive a nerve block during your surgery, you will need to start taking your pain medication shortly after your surgery and should continue to do so as prescribed by your surgeon.  Pain Medication:  Most commonly we prescribe Vicodin and Percocet for post-operative pain. Both of these medications contain a combination of acetaminophen (Tylenol) and a narcotic to help control pain.   It takes between 30 and 45 minutes before pain medication starts to work. It is important to take your medication before your pain level gets too intense.   Nausea is a common side effect of many pain medications. You will want to eat something before taking your pain medicine to help prevent nausea.   If you are taking a prescription pain medication that contains acetaminophen, we recommend that you do not take additional over the counter acetaminophen (Tylenol).  Other pain relieving options:   Using a cold pack to ice the affected area a few times a day (15 to 20 minutes at a time) can help to relieve pain, reduce swelling and bruising.   Elevation of the affected area can also help to reduce pain and swelling.     Post Anesthesia Home Care Instructions  Activity: Get plenty of rest for the remainder of the day. A responsible adult should stay with you for 24 hours following the procedure.  For the next 24 hours, DO NOT: -Drive a car -Paediatric nurse -Drink alcoholic beverages -Take any medication unless instructed by your physician -Make any legal decisions or sign important papers.  Meals: Start with liquid foods such  as gelatin or soup. Progress to regular foods as tolerated. Avoid greasy, spicy, heavy foods. If nausea and/or vomiting occur, drink only clear liquids until the nausea and/or vomiting subsides. Call your physician if vomiting continues.  Special Instructions/Symptoms: Your throat may feel dry or sore from the anesthesia or the breathing tube  placed in your throat during surgery. If this causes discomfort, gargle with warm salt water. The discomfort should disappear within 24 hours.

## 2014-09-15 NOTE — Anesthesia Preprocedure Evaluation (Signed)
Anesthesia Evaluation  Patient identified by MRN, date of birth, ID band Patient awake    Reviewed: Allergy & Precautions, H&P , NPO status , Patient's Chart, lab work & pertinent test results  Airway Mallampati: II  TM Distance: <3 FB Neck ROM: Full    Dental no notable dental hx.    Pulmonary neg pulmonary ROS, former smoker,  breath sounds clear to auscultation  Pulmonary exam normal       Cardiovascular hypertension, Pt. on medications Rhythm:Regular Rate:Normal     Neuro/Psych negative neurological ROS  negative psych ROS   GI/Hepatic Neg liver ROS, GERD-  Medicated,  Endo/Other  diabetes, Type 2, Oral Hypoglycemic Agents  Renal/GU negative Renal ROS  negative genitourinary   Musculoskeletal negative musculoskeletal ROS (+)   Abdominal   Peds negative pediatric ROS (+)  Hematology negative hematology ROS (+)   Anesthesia Other Findings   Reproductive/Obstetrics negative OB ROS                             Anesthesia Physical  Anesthesia Plan  ASA: III  Anesthesia Plan: General   Post-op Pain Management:    Induction: Intravenous  Airway Management Planned: LMA  Additional Equipment:   Intra-op Plan:   Post-operative Plan: Extubation in OR  Informed Consent: I have reviewed the patients History and Physical, chart, labs and discussed the procedure including the risks, benefits and alternatives for the proposed anesthesia with the patient or authorized representative who has indicated his/her understanding and acceptance.   Dental advisory given  Plan Discussed with: CRNA  Anesthesia Plan Comments:         Anesthesia Quick Evaluation

## 2014-09-15 NOTE — Anesthesia Procedure Notes (Signed)
Procedure Name: LMA Insertion Date/Time: 09/15/2014 8:34 AM Performed by: Lyndee Leo Pre-anesthesia Checklist: Patient identified, Emergency Drugs available, Suction available and Patient being monitored Patient Re-evaluated:Patient Re-evaluated prior to inductionOxygen Delivery Method: Circle System Utilized Preoxygenation: Pre-oxygenation with 100% oxygen Intubation Type: IV induction Ventilation: Mask ventilation without difficulty LMA: LMA inserted LMA Size: 4.0 Number of attempts: 1 Airway Equipment and Method: Bite block Placement Confirmation: positive ETCO2 Tube secured with: Tape Dental Injury: Teeth and Oropharynx as per pre-operative assessment

## 2014-09-16 ENCOUNTER — Encounter (HOSPITAL_BASED_OUTPATIENT_CLINIC_OR_DEPARTMENT_OTHER): Payer: Self-pay | Admitting: Orthopaedic Surgery

## 2014-09-24 ENCOUNTER — Other Ambulatory Visit: Payer: Self-pay

## 2014-09-24 MED ORDER — OMEPRAZOLE 20 MG PO CPDR: 20.0000 mg | DELAYED_RELEASE_CAPSULE | Freq: Every day | ORAL | Status: DC

## 2014-09-24 NOTE — Telephone Encounter (Signed)
Pt left v/m requesting omeprazole 20 mg to [facility]. Done. Pt last seen f/u 05/2714.

## 2014-10-06 DIAGNOSIS — M25421 Effusion, right elbow: Secondary | ICD-10-CM | POA: Diagnosis not present

## 2014-10-06 DIAGNOSIS — M25621 Stiffness of right elbow, not elsewhere classified: Secondary | ICD-10-CM | POA: Diagnosis not present

## 2014-10-06 DIAGNOSIS — Z4789 Encounter for other orthopedic aftercare: Secondary | ICD-10-CM | POA: Diagnosis not present

## 2014-10-06 DIAGNOSIS — M25521 Pain in right elbow: Secondary | ICD-10-CM | POA: Diagnosis not present

## 2014-10-08 DIAGNOSIS — M25421 Effusion, right elbow: Secondary | ICD-10-CM | POA: Diagnosis not present

## 2014-10-08 DIAGNOSIS — Z4789 Encounter for other orthopedic aftercare: Secondary | ICD-10-CM | POA: Diagnosis not present

## 2014-10-08 DIAGNOSIS — M25621 Stiffness of right elbow, not elsewhere classified: Secondary | ICD-10-CM | POA: Diagnosis not present

## 2014-10-08 DIAGNOSIS — M25521 Pain in right elbow: Secondary | ICD-10-CM | POA: Diagnosis not present

## 2014-10-12 DIAGNOSIS — M25521 Pain in right elbow: Secondary | ICD-10-CM | POA: Diagnosis not present

## 2014-10-12 DIAGNOSIS — Z4789 Encounter for other orthopedic aftercare: Secondary | ICD-10-CM | POA: Diagnosis not present

## 2014-10-12 DIAGNOSIS — M25421 Effusion, right elbow: Secondary | ICD-10-CM | POA: Diagnosis not present

## 2014-10-12 DIAGNOSIS — M25621 Stiffness of right elbow, not elsewhere classified: Secondary | ICD-10-CM | POA: Diagnosis not present

## 2014-10-14 DIAGNOSIS — M25521 Pain in right elbow: Secondary | ICD-10-CM | POA: Diagnosis not present

## 2014-10-14 DIAGNOSIS — Z4789 Encounter for other orthopedic aftercare: Secondary | ICD-10-CM | POA: Diagnosis not present

## 2014-10-14 DIAGNOSIS — M25421 Effusion, right elbow: Secondary | ICD-10-CM | POA: Diagnosis not present

## 2014-10-14 DIAGNOSIS — M25621 Stiffness of right elbow, not elsewhere classified: Secondary | ICD-10-CM | POA: Diagnosis not present

## 2014-10-19 DIAGNOSIS — M25621 Stiffness of right elbow, not elsewhere classified: Secondary | ICD-10-CM | POA: Diagnosis not present

## 2014-10-19 DIAGNOSIS — M25521 Pain in right elbow: Secondary | ICD-10-CM | POA: Diagnosis not present

## 2014-10-19 DIAGNOSIS — M25421 Effusion, right elbow: Secondary | ICD-10-CM | POA: Diagnosis not present

## 2014-10-19 DIAGNOSIS — Z4789 Encounter for other orthopedic aftercare: Secondary | ICD-10-CM | POA: Diagnosis not present

## 2014-10-25 DIAGNOSIS — M25521 Pain in right elbow: Secondary | ICD-10-CM | POA: Diagnosis not present

## 2014-10-25 DIAGNOSIS — M25421 Effusion, right elbow: Secondary | ICD-10-CM | POA: Diagnosis not present

## 2014-10-25 DIAGNOSIS — M25621 Stiffness of right elbow, not elsewhere classified: Secondary | ICD-10-CM | POA: Diagnosis not present

## 2014-10-25 DIAGNOSIS — Z4789 Encounter for other orthopedic aftercare: Secondary | ICD-10-CM | POA: Diagnosis not present

## 2014-10-26 DIAGNOSIS — M25561 Pain in right knee: Secondary | ICD-10-CM | POA: Diagnosis not present

## 2014-10-27 DIAGNOSIS — M25621 Stiffness of right elbow, not elsewhere classified: Secondary | ICD-10-CM | POA: Diagnosis not present

## 2014-10-27 DIAGNOSIS — M25521 Pain in right elbow: Secondary | ICD-10-CM | POA: Diagnosis not present

## 2014-10-27 DIAGNOSIS — M25421 Effusion, right elbow: Secondary | ICD-10-CM | POA: Diagnosis not present

## 2014-10-27 DIAGNOSIS — Z4789 Encounter for other orthopedic aftercare: Secondary | ICD-10-CM | POA: Diagnosis not present

## 2014-11-01 DIAGNOSIS — M25421 Effusion, right elbow: Secondary | ICD-10-CM | POA: Diagnosis not present

## 2014-11-01 DIAGNOSIS — M25521 Pain in right elbow: Secondary | ICD-10-CM | POA: Diagnosis not present

## 2014-11-01 DIAGNOSIS — M25621 Stiffness of right elbow, not elsewhere classified: Secondary | ICD-10-CM | POA: Diagnosis not present

## 2014-11-01 DIAGNOSIS — Z4789 Encounter for other orthopedic aftercare: Secondary | ICD-10-CM | POA: Diagnosis not present

## 2014-11-04 DIAGNOSIS — M25521 Pain in right elbow: Secondary | ICD-10-CM | POA: Diagnosis not present

## 2014-11-04 DIAGNOSIS — M25621 Stiffness of right elbow, not elsewhere classified: Secondary | ICD-10-CM | POA: Diagnosis not present

## 2014-11-04 DIAGNOSIS — M25421 Effusion, right elbow: Secondary | ICD-10-CM | POA: Diagnosis not present

## 2014-11-04 DIAGNOSIS — Z4789 Encounter for other orthopedic aftercare: Secondary | ICD-10-CM | POA: Diagnosis not present

## 2014-11-09 DIAGNOSIS — M25562 Pain in left knee: Secondary | ICD-10-CM | POA: Diagnosis not present

## 2014-11-09 DIAGNOSIS — G5621 Lesion of ulnar nerve, right upper limb: Secondary | ICD-10-CM | POA: Diagnosis not present

## 2014-11-09 DIAGNOSIS — M25561 Pain in right knee: Secondary | ICD-10-CM | POA: Diagnosis not present

## 2014-11-10 DIAGNOSIS — M25621 Stiffness of right elbow, not elsewhere classified: Secondary | ICD-10-CM | POA: Diagnosis not present

## 2014-11-10 DIAGNOSIS — M25521 Pain in right elbow: Secondary | ICD-10-CM | POA: Diagnosis not present

## 2014-11-10 DIAGNOSIS — M25421 Effusion, right elbow: Secondary | ICD-10-CM | POA: Diagnosis not present

## 2014-11-10 DIAGNOSIS — Z4789 Encounter for other orthopedic aftercare: Secondary | ICD-10-CM | POA: Diagnosis not present

## 2014-11-15 DIAGNOSIS — M25421 Effusion, right elbow: Secondary | ICD-10-CM | POA: Diagnosis not present

## 2014-11-15 DIAGNOSIS — M25621 Stiffness of right elbow, not elsewhere classified: Secondary | ICD-10-CM | POA: Diagnosis not present

## 2014-11-15 DIAGNOSIS — Z4789 Encounter for other orthopedic aftercare: Secondary | ICD-10-CM | POA: Diagnosis not present

## 2014-11-15 DIAGNOSIS — M25521 Pain in right elbow: Secondary | ICD-10-CM | POA: Diagnosis not present

## 2014-11-18 ENCOUNTER — Other Ambulatory Visit: Payer: Self-pay | Admitting: Family Medicine

## 2014-11-18 DIAGNOSIS — Z4789 Encounter for other orthopedic aftercare: Secondary | ICD-10-CM | POA: Diagnosis not present

## 2014-11-18 DIAGNOSIS — M25621 Stiffness of right elbow, not elsewhere classified: Secondary | ICD-10-CM | POA: Diagnosis not present

## 2014-11-18 DIAGNOSIS — M25521 Pain in right elbow: Secondary | ICD-10-CM | POA: Diagnosis not present

## 2014-11-18 DIAGNOSIS — M25421 Effusion, right elbow: Secondary | ICD-10-CM | POA: Diagnosis not present

## 2014-11-22 DIAGNOSIS — M25621 Stiffness of right elbow, not elsewhere classified: Secondary | ICD-10-CM | POA: Diagnosis not present

## 2014-11-22 DIAGNOSIS — M25521 Pain in right elbow: Secondary | ICD-10-CM | POA: Diagnosis not present

## 2014-11-22 DIAGNOSIS — Z4789 Encounter for other orthopedic aftercare: Secondary | ICD-10-CM | POA: Diagnosis not present

## 2014-11-22 DIAGNOSIS — M25421 Effusion, right elbow: Secondary | ICD-10-CM | POA: Diagnosis not present

## 2014-11-23 DIAGNOSIS — M25511 Pain in right shoulder: Secondary | ICD-10-CM | POA: Diagnosis not present

## 2014-11-23 DIAGNOSIS — M25561 Pain in right knee: Secondary | ICD-10-CM | POA: Diagnosis not present

## 2014-11-25 DIAGNOSIS — Z4789 Encounter for other orthopedic aftercare: Secondary | ICD-10-CM | POA: Diagnosis not present

## 2014-11-25 DIAGNOSIS — M25621 Stiffness of right elbow, not elsewhere classified: Secondary | ICD-10-CM | POA: Diagnosis not present

## 2014-11-25 DIAGNOSIS — M25521 Pain in right elbow: Secondary | ICD-10-CM | POA: Diagnosis not present

## 2014-11-25 DIAGNOSIS — M25421 Effusion, right elbow: Secondary | ICD-10-CM | POA: Diagnosis not present

## 2014-12-07 ENCOUNTER — Ambulatory Visit (INDEPENDENT_AMBULATORY_CARE_PROVIDER_SITE_OTHER): Payer: Commercial Managed Care - HMO | Admitting: Family Medicine

## 2014-12-07 ENCOUNTER — Encounter: Payer: Self-pay | Admitting: Family Medicine

## 2014-12-07 VITALS — BP 138/74 | HR 93 | Temp 98.4°F | Wt 185.5 lb

## 2014-12-07 DIAGNOSIS — R259 Unspecified abnormal involuntary movements: Secondary | ICD-10-CM | POA: Diagnosis not present

## 2014-12-07 DIAGNOSIS — I10 Essential (primary) hypertension: Secondary | ICD-10-CM | POA: Diagnosis not present

## 2014-12-07 DIAGNOSIS — R253 Fasciculation: Secondary | ICD-10-CM | POA: Insufficient documentation

## 2014-12-07 NOTE — Patient Instructions (Signed)
Blood pressure is stable  If eyelid twitch continues to bother you - see your eye doctor  If headache or vision trouble or other new symptoms-please alert me   Take care of yourself   Follow up for annual exam in 6 months with labs prior

## 2014-12-07 NOTE — Progress Notes (Signed)
Subjective:    Patient ID: Erin Beck, female    DOB: 01/15/1941, 74 y.o.   MRN: [id number]  HPI Here for f/u of HTN and eyelid twitch   She is still dealing with pain -better than she was  Sees specialist  Intol of gabapentin and norco so she just takes tylenol  Has bad discs in neck and low back  Also needs rotator cuff repair in R shoulder  Also carpal tunnel   Doing as well as she can  bp is stable today  No cp or palpitations or headaches or edema  No side effects to medicines  BP Readings from Last 3 Encounters:  12/07/14 138/74  09/15/14 141/64  09/14/14 124/62   overall feeling good from that perspective-does not check at home    Having eyelid twitching  R lower eyelid  Started 3 mo ago  occ helps to rub her R temple  No headaches  No vision change   Supposed to see Dr Rosana Hoes for eye check in Aug  Has been fine     Chemistry      Component Value Date/Time   NA 136 09/13/2014 1200   K 4.6 09/13/2014 1200   CL 98 09/13/2014 1200   CO2 31 09/13/2014 1200   BUN 11 09/13/2014 1200   CREATININE 0.68 09/13/2014 1200      Component Value Date/Time   CALCIUM 9.8 09/13/2014 1200   ALKPHOS 73 06/08/2014 0824   AST 36 06/08/2014 0824   ALT 59* 06/08/2014 0824   BILITOT 0.7 06/08/2014 0824     Lab Results  Component Value Date   TSH 1.76 06/08/2014    Lab Results  Component Value Date   WBC 8.3 06/08/2014   HGB 14.4 09/15/2014   HCT 43.5 06/08/2014   MCV 91.3 06/08/2014   PLT 253.0 06/08/2014    Patient Active Problem List   Diagnosis Date Noted  . 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 Local reaction to immunization 06/16/2014  . Chronic neck and back pain 06/15/2014  . Acute ear pain 06/10/2014  . Cephalalgia 06/10/2014  . Right shoulder pain 03/12/2014  . Vitreomacular adhesion 05/05/2013  . Unspecified asthma(493.90) 04/23/2013  . Encounter for Medicare annual wellness exam 03/03/2013  . Personal history of colonic polyps 03/03/2013  .  Post-menopausal 10/17/2012  . Obesity 10/31/2011  . Other screening mammogram 10/31/2011  . IBS (irritable bowel syndrome) 08/10/2011  . GERD 07/31/2010  . ALLERGIC RHINITIS 12/05/2007  . HYPERTENSION, BENIGN ESSENTIAL 02/05/2007  . TOBACCO USE, QUIT 02/05/2007  . Diabetes type 2, uncontrolled 01/31/2007  . Hyperlipidemia 01/31/2007  . ANXIETY 01/31/2007  . OSTEOARTHRITIS 01/31/2007  . DIVERTICULITIS, HX OF 01/31/2007   Past Medical History  Diagnosis Date  . Anxiety   . Diabetes mellitus     type II  . Diverticulitis     Hx of  . Hyperlipidemia   . Arthritis     OA  . Osteopenia   . GERD (gastroesophageal reflux disease)   . Hypertension   . Allergic rhinitis   . IBS (irritable bowel syndrome)   . Hematuria     HX OF MICROSCOPIC BLOOD IN URINE AND HX OF CYST ON ONE KIDNEY--BUT NO KIDNEY DISEASE  . Asthma   . Full dentures   . Wears glasses   . Complication of anesthesia     gas bubble rt eye from an old retinal detachment   Past Surgical History  Procedure Laterality Date  . Abdominal  hysterectomy    . Cholecystectomy    . Knee arthroscopy  1990    chondromalacia   . Total knee arthroplasty  12/05/2011    Procedure: TOTAL KNEE ARTHROPLASTY;  Surgeon: Magnus Sinning, MD;  Location: [facility];  Service: Orthopedics;  Laterality: Right;  . Appendectomy      per pt  . Joint replacement  2000    LEFT TOTAL KNEE ARTHROPLASTY  . Joint replacement  2013    rt total knee  . Colonoscopy    . Carpal tunnel release      both hands  . Eye surgery      BILATERAL CATARACTS REMOVED  . Eye surgery      gas bubble in rt eye from a retinal detachment  . Ulnar nerve transposition Right 09/15/2014    Procedure: Right cubital tunnel release with transposition of ulnar nerve;  Surgeon: Marianna Payment, MD;  Location: [place];  Service: Orthopedics;  Laterality: Right;   History  Substance Use Topics  . Smoking status: Former Smoker -- 1.00 packs/day for 48  years    Quit date: 08/20/2009  . Smokeless tobacco: Never Used  . Alcohol Use: No   Family History  Problem Relation Age of Onset  . Diabetes Father   . Diabetes Brother    Allergies  Allergen Reactions  . Naproxen Rash    Trouble breathing  . Naproxen Sodium Rash    Trouble breathing  . Aspirin     REACTION: burns stomach PT STATES SHE DOES TOLERATE THE 81 MG ASPIRIN DAILY  . Atorvastatin     REACTION: muscle pain  . Buspar [Buspirone Hcl]     Multiple side eff  . Gabapentin Other (See Comments)    Palpitations/ dizziness   . Metformin     REACTION: Diarrhea  . Metformin And Related Diarrhea  . Norco [Hydrocodone-Acetaminophen] Other (See Comments)    Dizzy and problems remembering   . Simvastatin     REACTION: GI side eff and swelling  . Sulfa Antibiotics Other (See Comments)    Severe joint pain  . Sulfonamide Derivatives     REACTION: reaction not known   Current Outpatient Prescriptions on File Prior to Visit  Medication Sig Dispense Refill  . ACCU-CHEK FASTCLIX LANCETS MISC Use to test blood sugar 4 times daily as instructed. Dx code: E11.65 400 each 3  . acetaminophen (TYLENOL) 325 MG tablet Take 2 tablets (650 mg total) by mouth every 6 (six) hours as needed.    Marland Kitchen aspirin 81 MG tablet Take 81 mg by mouth daily.    . Blood Glucose Monitoring Suppl (ACCU-CHEK NANO SMARTVIEW) W/DEVICE KIT Use to test blood sugar daily. 1 kit 0  . glipiZIDE (GLUCOTROL XL) 5 MG 24 hr tablet Take 1 tablet (5 mg total) by mouth daily. 180 tablet 1  . glucose blood (ACCU-CHEK SMARTVIEW) test strip Use to test blood sugar 4 times daily as instructed. Dx code: E11.65 400 each 3  . glucose blood test strip 1 each 2 (two) times daily. Check blood sugar twice daily and as needed.    . hydrochlorothiazide (HYDRODIURIL) 25 MG tablet TAKE 1 TABLET EVERY DAY 90 tablet 3  . Lancets (FREESTYLE) lancets 2 (two) times daily. Check blood sugar twice daily and as needed.    Marland Kitchen lisinopril  (PRINIVIL,ZESTRIL) 10 MG tablet TAKE 1 TABLET EVERY DAY 90 tablet 3  . metFORMIN (GLUCOPHAGE-XR) 500 MG 24 hr tablet Take by mouth 1 tablet in  am and 2 tablets in pm 180 tablet 3  . omeprazole (PRILOSEC) 20 MG capsule Take 1 capsule (20 mg total) by mouth daily. 90 capsule 1  . PROAIR HFA 108 (90 BASE) MCG/ACT inhaler INHALE 2 PUFFS INTO THE LUNGS EVERY 6 (SIX) HOURS AS NEEDED. 8.5 each 2   No current facility-administered medications on file prior to visit.       Review of Systems    Review of Systems  Constitutional: Negative for fever, appetite change, fatigue and unexpected weight change.  Eyes: Negative for pain and visual disturbance. pos for R lower eyelid twitching  Respiratory: Negative for cough and shortness of breath.   Cardiovascular: Negative for cp or palpitations    Gastrointestinal: Negative for nausea, diarrhea and constipation.  Genitourinary: Negative for urgency and frequency.  Skin: Negative for pallor or rash   MSK pos for chronic back pain and stiffness  Neurological: Negative for weakness, light-headedness, numbness and headaches.  Hematological: Negative for adenopathy. Does not bruise/bleed easily.  Psychiatric/Behavioral: Negative for dysphoric mood. The patient is not nervous/anxious.      Objective:   Physical Exam  Constitutional: She appears well-developed and well-nourished. No distress.  obese and well appearing   HENT:  Head: Normocephalic and atraumatic.  Mouth/Throat: Oropharynx is clear and moist.  Eyes: Conjunctivae and EOM are normal. Pupils are equal, round, and reactive to light. Right eye exhibits no discharge. Left eye exhibits no discharge. No scleral icterus.  Persistent L lower eyelid twitch   Neck: Normal range of motion. Neck supple.  Cardiovascular: Normal rate and regular rhythm.   Pulmonary/Chest: Effort normal and breath sounds normal. No respiratory distress. She has no wheezes. She has no rales.  Musculoskeletal: She  exhibits no edema.  Lymphadenopathy:    She has no cervical adenopathy.  Neurological: She is alert. She has normal strength and normal reflexes. She displays no atrophy and no tremor. No cranial nerve deficit or sensory deficit. She exhibits normal muscle tone. Coordination and gait normal.  Skin: Skin is warm and dry. No rash noted. No erythema. No pallor.  Psychiatric: She has a normal mood and affect.          Assessment & Plan:   Problem List Items Addressed This Visit      Cardiovascular and Mediastinum   HYPERTENSION, BENIGN ESSENTIAL - Primary    bp in fair control at this time  BP Readings from Last 1 Encounters:  12/07/14 138/74   No changes needed Disc lifstyle change with low sodium diet and exercise  F/u in 6 mo with lab prior for annual exam        Other   Eyelid twitch    Lower eyelid twitch -evident today  Pt is mildly bothered by it  No vision change or pain  Will observe- and if worse or no improvement recommend f/u with opthy

## 2014-12-07 NOTE — Progress Notes (Signed)
Pre visit review using our clinic review tool, if applicable. No additional management support is needed unless otherwise documented below in the visit note. 

## 2014-12-09 NOTE — Assessment & Plan Note (Signed)
bp in fair control at this time  BP Readings from Last 1 Encounters:  12/07/14 138/74   No changes needed Disc lifstyle change with low sodium diet and exercise  F/u in 6 mo with lab prior for annual exam

## 2014-12-09 NOTE — Assessment & Plan Note (Signed)
Lower eyelid twitch -evident today  Pt is mildly bothered by it  No vision change or pain  Will observe- and if worse or no improvement recommend f/u with opthy

## 2014-12-14 ENCOUNTER — Encounter: Payer: Self-pay | Admitting: Internal Medicine

## 2014-12-14 ENCOUNTER — Ambulatory Visit (INDEPENDENT_AMBULATORY_CARE_PROVIDER_SITE_OTHER): Payer: Commercial Managed Care - HMO | Admitting: Internal Medicine

## 2014-12-14 VITALS — BP 126/64 | HR 99 | Temp 97.8°F | Resp 14 | Wt 186.0 lb

## 2014-12-14 DIAGNOSIS — IMO0002 Reserved for concepts with insufficient information to code with codable children: Secondary | ICD-10-CM

## 2014-12-14 DIAGNOSIS — E1165 Type 2 diabetes mellitus with hyperglycemia: Secondary | ICD-10-CM

## 2014-12-14 LAB — HEMOGLOBIN A1C: Hgb A1c MFr Bld: 6.8 % — ABNORMAL HIGH (ref 4.6–6.5)

## 2014-12-14 MED ORDER — METFORMIN HCL ER 500 MG PO TB24: ORAL_TABLET | ORAL | Status: DC

## 2014-12-14 NOTE — Patient Instructions (Signed)
Please increase Metformin ER to 500 mg in am and 1000 mg (2 tablets) with dinner. Continue Glipizide XL to 5 mg in am.  Please let me know if the sugars are consistently <80 or >200.  Please stop at the lab.  Please return in 3 months with your sugar log.

## 2014-12-14 NOTE — Progress Notes (Signed)
Patient ID: Erin Beck, female   DOB: 01/30/1941, 74 y.o.   MRN: [id number]  HPI: Erin Beck is a 74 y.o.-year-old female, returning for f/u for DM2, non-insulin-dependent, dx 2005, uncontrolled, without complications. Last visit 3 mo ago.  She had R elbow surgery 09/15/2014.   Last hemoglobin A1c was: Received labs from Dr. Erlinda Hong [address], 08/30/2014): - HbA1c 6.9%, decreased from 8.0%! Will scan original report Lab Results  Component Value Date   HGBA1C 8.0* 03/09/2014   HGBA1C 7.3* 10/05/2013   HGBA1C 7.5* 07/06/2013   Pt is on a regimen of: - Glipizide XL 10 >> 5 mg in am - Metformin 500 mg po bid >> Metformin ER 500 mg in am and 1000 mg in pm She has not been on insulin before, would not like to stick herself.  Pt checks her sugars 1-2 a day >> much improved (good log) >> higher: - am: 170-180 >> 111-154 >> 90-127 >> 100-112 >> 91-140 - 2h after b'fast: n/c >> 242, 273 >> 150s >> n/c >> 136-252 - before lunch: n/c >> 75-120 >> 118-154, 189, 209 - 2h after lunch: n/c >> 200-210 (?) >> 115-200 - before dinner: n/c >> 113-153 >> n/c >> 100-130 >> 80, 95-143 - 2h after dinner: low 200s >> 153, 161 >> 150-160 >> 120-150 >> 123-194 - bedtime: n/c >> 164 >> 120-140 >> 100, 120-140 >> 105-180 - nighttime: n/c >> 80 x1 >> 98-105 >> 91-132 No lows. Lowest sugar was 80s;  she has hypoglycemia awareness at 80.  Highest sugar was 220 >> 160 >> 210 >> 252  She goes to tx once a week with her knee.  Pt's meals are: - Breakfast: cereals - Lunch: salads, 1 sandwich, leftovers - Dinner: salmon/chicken, vegetables - Snacks: 1 fruit, crackers - not every day Cut out soft drinks.  - no CKD, last BUN/creatinine:  Lab Results  Component Value Date   BUN 11 09/13/2014   CREATININE 0.68 09/13/2014  On Lisinopril. - last set of lipids: Lab Results  Component Value Date   CHOL 230* 06/08/2014   HDL 30.80* 06/08/2014   LDLCALC 127* 03/09/2014   LDLDIRECT 153.7 06/08/2014   TRIG 275.0* 06/08/2014   CHOLHDL 7 06/08/2014  She was on a statin >> AP.  - last eye exam was in 07/2014. No DR. Had cataracts sx. - no numbness and tingling in her feet.  I reviewed pt's medications, allergies, PMH, social hx, family hx, and changes were documented in the history of present illness. Otherwise, unchanged from my initial visit note..  ROS: Constitutional: no weight gain/loss, no fatigue, no subjective hyperthermia/hypothermia Eyes: no blurry vision, no xerophthalmia ENT: no sore throat, no nodules palpated in throat, no dysphagia/odynophagia, no hoarseness Cardiovascular: no CP/SOB/palpitations/leg swelling Respiratory: no cough/SOB Gastrointestinal: no N/V/D/C Musculoskeletal: no muscle/joint aches Skin: no rashes Neurological: no tremors/numbness/tingling/dizziness  PE: BP 126/64 mmHg  Pulse 99  Temp(Src) 97.8 F (36.6 C) (Oral)  Resp 14  Wt 186 lb (84.369 kg)  SpO2 96% Wt Readings from Last 3 Encounters:  12/14/14 186 lb (84.369 kg)  12/07/14 185 lb 8 oz (84.142 kg)  09/15/14 185 lb (83.915 kg)   Constitutional: overweight, in NAD, nasal congestion Eyes: PERRLA, EOMI, no exophthalmos ENT: moist mucous membranes, no thyromegaly, no cervical lymphadenopathy Cardiovascular: RRR, No MRG Respiratory: CTA B Gastrointestinal: abdomen soft, NT, ND, BS+ Musculoskeletal: no deformities, strength intact in all 4 Skin: moist, warm, no rashes Neurological: no tremor with outstretched hands, DTR normal in  all 4  ASSESSMENT: 1. DM2, non-insulin-dependent, uncontrolled, without complications  PLAN:  1. Patient with long-standing, now better controlled diabetes, on oral antidiabetic regimen, now with worsened control after reducing the Glipizide XL to 5 mg, since she forgot to increase Metformin.  - I suggested to:  Patient Instructions  Please increase Metformin ER to 500 mg in am and 1000 mg (2 tablets) with dinner. Continue Glipizide XL to 5 mg in am.  Please  let me know if the sugars are consistently <80 or >200.  Please stop at the lab.  Please return in 3 months with your sugar log.   - continue checking sugars at different times of the day - check 2 times a day, rotating checks - advised for yearly eye exams >> she is Up to date - will check HbA1c today - Return to clinic in 3 mo with sugar log   Office Visit on 12/14/2014  Component Date Value Ref Range Status  . Hgb A1c MFr Bld 12/14/2014 6.8* 4.6 - 6.5 % Final   Glycemic Control Guidelines for People with Diabetes:Non Diabetic:  <6%Goal of Therapy: <7%Additional Action Suggested:  >8%   HbA1c is better.

## 2014-12-31 ENCOUNTER — Telehealth: Payer: Self-pay | Admitting: *Deleted

## 2014-12-31 NOTE — Telephone Encounter (Signed)
Called patient to schedule screening mammogram.  She has not had one in the past 2 years.  Contact information given for the [facility].  Patient will call to schedule.

## 2015-01-13 ENCOUNTER — Other Ambulatory Visit: Payer: Self-pay | Admitting: Internal Medicine

## 2015-01-19 ENCOUNTER — Telehealth: Payer: Self-pay | Admitting: Internal Medicine

## 2015-01-19 MED ORDER — ACCU-CHEK FASTCLIX LANCETS MISC: Status: DC

## 2015-01-19 NOTE — Telephone Encounter (Signed)
Called pt and pt advised that she needs a refill of her Accu-chek lancets. Done.

## 2015-01-19 NOTE — Telephone Encounter (Signed)
Pt called needs reflil on lantus

## 2015-01-20 ENCOUNTER — Other Ambulatory Visit: Payer: Self-pay

## 2015-01-20 DIAGNOSIS — Z1231 Encounter for screening mammogram for malignant neoplasm of breast: Secondary | ICD-10-CM

## 2015-02-08 ENCOUNTER — Other Ambulatory Visit: Payer: Self-pay | Admitting: Family Medicine

## 2015-03-01 ENCOUNTER — Ambulatory Visit
Admission: RE | Admit: 2015-03-01 | Discharge: 2015-03-01 | Disposition: A | Discharge status: Home / Self Care | Payer: Commercial Managed Care - HMO | Source: Ambulatory Visit

## 2015-03-01 DIAGNOSIS — Z1231 Encounter for screening mammogram for malignant neoplasm of breast: Secondary | ICD-10-CM | POA: Diagnosis not present

## 2015-03-01 LAB — HM MAMMOGRAPHY: HM MAMMO: NORMAL

## 2015-03-03 ENCOUNTER — Encounter: Payer: Self-pay | Admitting: *Deleted

## 2015-03-03 ENCOUNTER — Encounter: Payer: Self-pay | Admitting: Family Medicine

## 2015-03-15 ENCOUNTER — Ambulatory Visit (INDEPENDENT_AMBULATORY_CARE_PROVIDER_SITE_OTHER): Payer: Commercial Managed Care - HMO | Admitting: Internal Medicine

## 2015-03-15 ENCOUNTER — Encounter: Payer: Self-pay | Admitting: Internal Medicine

## 2015-03-15 VITALS — BP 138/62 | HR 94 | Temp 98.0°F | Resp 16 | Ht 61.0 in | Wt 187.0 lb

## 2015-03-15 DIAGNOSIS — E1165 Type 2 diabetes mellitus with hyperglycemia: Secondary | ICD-10-CM | POA: Diagnosis not present

## 2015-03-15 DIAGNOSIS — IMO0002 Reserved for concepts with insufficient information to code with codable children: Secondary | ICD-10-CM

## 2015-03-15 LAB — POCT GLYCOSYLATED HEMOGLOBIN (HGB A1C): Hemoglobin A1C: 6.4

## 2015-03-15 MED ORDER — METFORMIN HCL ER 500 MG PO TB24: ORAL_TABLET | ORAL | Status: DC

## 2015-03-15 NOTE — Patient Instructions (Signed)
Please decrease Metformin ER to 1000 mg (2 tablets) with dinner. Continue Glipizide XL to 5 mg in am.  Please return in 3 months with your sugar log.

## 2015-03-15 NOTE — Progress Notes (Signed)
Patient ID: FANTASY DONALD, female   DOB: [birth year], 74 y.o.   MRN: [id number]  HPI: Erin Beck is a 74 y.o.-year-old female, returning for f/u for DM2, non-insulin-dependent, dx 2005, uncontrolled, without complications. Last visit 3 mo ago.  Last hemoglobin A1c was:  Lab Results  Component Value Date   HGBA1C 6.8* 12/14/2014   HGBA1C 8.0* 03/09/2014   HGBA1C 7.3* 10/05/2013  Received labs from Dr. Erlinda Hong [address], 08/30/2014): - HbA1c 6.9%, decreased from 8.0%  Pt is on a regimen of: - Glipizide XL 10 >> 5 mg in am - Metformin 500 mg po bid >> Metformin ER 500 mg in am and 1000 mg in pm She has not been on insulin before, would not like to stick herself.  Pt checks her sugars 1-2 a day >> much improved (good log): - am: 170-180 >> 111-154 >> 90-127 >> 100-112 >> 91-140 >> 101-130 - 2h after b'fast: n/c >> 242, 273 >> 150s >> n/c >> 136-252 >>89, 184, 231 - before lunch: n/c >> 75-120 >> 118-154, 189, 209 >> 97-152 - 2h after lunch: n/c >> 200-210 (?) >> 115-200 >> 121-146 - before dinner: n/c >> 113-153 >> n/c >> 100-130 >> 80, 95-143 >> 82-132 - 2h after dinner: low 200s >> 153, 161 >> 150-160 >> 120-150 >> 123-194 >> 103-190 - bedtime: n/c >> 164 >> 120-140 >> 100, 120-140 >> 105-180 >> 81, 84-122 - nighttime: n/c >> 80 x1 >> 98-105 >> 91-132 >> n/c No lows. Lowest sugar was 80s >> feels them;  she has hypoglycemia awareness at 80.  Highest sugar was 220 >> 160 >> 210 >> 252 >> 231  Pt's meals are: - Breakfast: cereals - Lunch: salads, 1 sandwich, leftovers - Dinner: salmon/chicken, vegetables - Snacks: 1 fruit, crackers - not every day Cut out soft drinks.  - no CKD, last BUN/creatinine:  Lab Results  Component Value Date   BUN 11 09/13/2014   CREATININE 0.68 09/13/2014  On Lisinopril. - last set of lipids: Lab Results  Component Value Date   CHOL 230* 06/08/2014   HDL 30.80* 06/08/2014   LDLCALC 127* 03/09/2014   LDLDIRECT 153.7 06/08/2014   TRIG 275.0*  06/08/2014   CHOLHDL 7 06/08/2014  She was on a statin >> AP.  - last eye exam was in 07/2014. No DR. Had cataracts sx. - no numbness and tingling in her feet.  I reviewed pt's medications, allergies, PMH, social hx, family hx, and changes were documented in the history of present illness. Otherwise, unchanged from my initial visit note..  ROS: Constitutional: + weight gain, no fatigue, no subjective hyperthermia/hypothermia Eyes: no blurry vision, no xerophthalmia ENT: no sore throat, no nodules palpated in throat, no dysphagia/odynophagia, no hoarseness Cardiovascular: no CP/SOB/palpitations/leg swelling Respiratory: no cough/SOB Gastrointestinal: no N/V/+ D/no C Musculoskeletal: no muscle/joint aches Skin: no rashes Neurological: no tremors/numbness/tingling/dizziness  PE: BP 138/62 mmHg  Pulse 94  Temp(Src) 98 F (36.7 C) (Oral)  Resp 16  Ht 5\' 1"  (1.549 m)  Wt 187 lb (84.823 kg)  BMI 35.35 kg/m2  SpO2 96% Wt Readings from Last 3 Encounters:  03/15/15 187 lb (84.823 kg)  12/14/14 186 lb (84.369 kg)  12/07/14 185 lb 8 oz (84.142 kg)   Constitutional: overweight, in NAD, nasal congestion Eyes: PERRLA, EOMI, no exophthalmos ENT: moist mucous membranes, no thyromegaly, no cervical lymphadenopathy Cardiovascular: RRR, No MRG Respiratory: CTA B Gastrointestinal: abdomen soft, NT, ND, BS+ Musculoskeletal: no deformities, strength intact in all 4 Skin: moist,  warm, no rashes Neurological: no tremor with outstretched hands, DTR normal in all 4  ASSESSMENT: 1. DM2, non-insulin-dependent, uncontrolled, without complications  PLAN:  1. Patient with long-standing, now better controlled diabetes, on oral antidiabetic regimen, now with improved control but she gets diarrhea with am Metformin >> will only keep the 1000 mg at night. She has few CBGs in the 80s >> I believe this will be helped by decreasing Metformin dose and by having less diarrhea. - I suggested to:  Patient  Instructions  Please decrease Metformin ER to 1000 mg (2 tablets) with dinner. Continue Glipizide XL to 5 mg in am.  Please return in 3 months with your sugar log.    - continue checking sugars at different times of the day - check 2 times a day, rotating checks - advised for yearly eye exams >> she is Up to date - will check HbA1c today >> 6.4% (improved!) - Return to clinic in 3 mo with sugar log

## 2015-03-18 ENCOUNTER — Other Ambulatory Visit: Payer: Self-pay | Admitting: Family Medicine

## 2015-03-29 ENCOUNTER — Encounter: Payer: Self-pay | Admitting: Family Medicine

## 2015-04-05 ENCOUNTER — Other Ambulatory Visit: Payer: Self-pay | Admitting: Internal Medicine

## 2015-04-19 ENCOUNTER — Telehealth: Payer: Self-pay | Admitting: Internal Medicine

## 2015-04-19 MED ORDER — GLUCOSE BLOOD VI STRP: ORAL_STRIP | Status: DC

## 2015-04-19 NOTE — Addendum Note (Signed)
Addended by: Rockie Neighbours B on: 04/19/2015 01:04 PM   Modules accepted: Orders, Medications

## 2015-04-19 NOTE — Telephone Encounter (Signed)
Done

## 2015-04-19 NOTE — Telephone Encounter (Signed)
Need refill accu check smart view strips for 90 day supply with refills to [facility]

## 2015-05-13 ENCOUNTER — Encounter: Payer: Self-pay | Admitting: Family Medicine

## 2015-05-13 ENCOUNTER — Ambulatory Visit (INDEPENDENT_AMBULATORY_CARE_PROVIDER_SITE_OTHER): Payer: Commercial Managed Care - HMO | Admitting: Family Medicine

## 2015-05-13 VITALS — BP 122/66 | HR 87 | Temp 97.9°F | Ht 61.0 in | Wt 185.5 lb

## 2015-05-13 DIAGNOSIS — N390 Urinary tract infection, site not specified: Secondary | ICD-10-CM | POA: Insufficient documentation

## 2015-05-13 DIAGNOSIS — B9689 Other specified bacterial agents as the cause of diseases classified elsewhere: Secondary | ICD-10-CM

## 2015-05-13 DIAGNOSIS — R300 Dysuria: Secondary | ICD-10-CM | POA: Diagnosis not present

## 2015-05-13 DIAGNOSIS — J019 Acute sinusitis, unspecified: Secondary | ICD-10-CM

## 2015-05-13 DIAGNOSIS — N3000 Acute cystitis without hematuria: Secondary | ICD-10-CM | POA: Diagnosis not present

## 2015-05-13 LAB — POCT URINALYSIS DIPSTICK
Bilirubin, UA: NEGATIVE
Blood, UA: NEGATIVE
Glucose, UA: NEGATIVE
KETONES UA: NEGATIVE
Nitrite, UA: NEGATIVE
Spec Grav, UA: 1.015
Urobilinogen, UA: 0.2
pH, UA: 6

## 2015-05-13 MED ORDER — LEVOFLOXACIN 500 MG PO TABS: 500.0000 mg | ORAL_TABLET | Freq: Every day | ORAL | Status: DC

## 2015-05-13 NOTE — Progress Notes (Signed)
Subjective:    Patient ID: Erin Beck, female    DOB: Sep 24, 1940, 74 y.o.   MRN: [id number]  HPI Here for sinus and urinary symptoms   Sinus symptoms - pain under R eye and ear hurts (about a week) -started in the mt  Has had bad allergies and stays congested  Is dizzy with change in position as well  No purulent nasal drainage -- not draining at all  Some post nasal drip  occ cough No fever     When she urinates- it burns and gets bladder spasms (worse in am and better after she drinks lots of water)  Started Wednesday  No blood in urine  No fever or n/v     Results for orders placed or performed in visit on 05/13/15  POCT urinalysis dipstick  Result Value Ref Range   Color, UA Yellow    Clarity, UA Hazy    Glucose, UA Neg.    Bilirubin, UA Neg.    Ketones, UA Neg.    Spec Grav, UA 1.015    Blood, UA Neg.    pH, UA 6.0    Protein, UA Trace    Urobilinogen, UA 0.2    Nitrite, UA Neg.    Leukocytes, UA small (1+) (A) Negative    Just drank a lot of water   Patient Active Problem List   Diagnosis Date Noted  . Eyelid twitch 12/07/2014  . 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 Local reaction to immunization 06/16/2014  . Chronic neck and back pain 06/15/2014  . Acute ear pain 06/10/2014  . Cephalalgia 06/10/2014  . Right shoulder pain 03/12/2014  . Vitreomacular adhesion 05/05/2013  . Unspecified asthma(493.90) 04/23/2013  . Encounter for Medicare annual wellness exam 03/03/2013  . Personal history of colonic polyps 03/03/2013  . Post-menopausal 10/17/2012  . Obesity 10/31/2011  . Other screening mammogram 10/31/2011  . IBS (irritable bowel syndrome) 08/10/2011  . GERD 07/31/2010  . ALLERGIC RHINITIS 12/05/2007  . HYPERTENSION, BENIGN ESSENTIAL 02/05/2007  . TOBACCO USE, QUIT 02/05/2007  . Diabetes type 2, uncontrolled 01/31/2007  . Hyperlipidemia 01/31/2007  . ANXIETY 01/31/2007  . OSTEOARTHRITIS 01/31/2007  . DIVERTICULITIS, HX OF 01/31/2007    Past Medical History  Diagnosis Date  . Anxiety   . Diabetes mellitus     type II  . Diverticulitis     Hx of  . Hyperlipidemia   . Arthritis     OA  . Osteopenia   . GERD (gastroesophageal reflux disease)   . Hypertension   . Allergic rhinitis   . IBS (irritable bowel syndrome)   . Hematuria     HX OF MICROSCOPIC BLOOD IN URINE AND HX OF CYST ON ONE KIDNEY--BUT NO KIDNEY DISEASE  . Asthma   . Full dentures   . Wears glasses   . Complication of anesthesia     gas bubble rt eye from an old retinal detachment   Past Surgical History  Procedure Laterality Date  . Abdominal hysterectomy    . Cholecystectomy    . Knee arthroscopy  1990    chondromalacia   . Total knee arthroplasty  12/05/2011    Procedure: TOTAL KNEE ARTHROPLASTY;  Surgeon: Magnus Sinning, MD;  Location: [facility];  Service: Orthopedics;  Laterality: Right;  . Appendectomy      per pt  . Joint replacement  2000    LEFT TOTAL KNEE ARTHROPLASTY  . Joint replacement  2013    rt total knee  .  Colonoscopy    . Carpal tunnel release      both hands  . Eye surgery      BILATERAL CATARACTS REMOVED  . Eye surgery      gas bubble in rt eye from a retinal detachment  . Ulnar nerve transposition Right 09/15/2014    Procedure: Right cubital tunnel release with transposition of ulnar nerve;  Surgeon: Marianna Payment, MD;  Location: [facility];  Service: Orthopedics;  Laterality: Right;   Social History  Substance Use Topics  . Smoking status: Former Smoker -- 1.00 packs/day for 48 years    Quit date: 08/20/2009  . Smokeless tobacco: Never Used  . Alcohol Use: No   Family History  Problem Relation Age of Onset  . Diabetes Father   . Diabetes Brother    Allergies  Allergen Reactions  . Naproxen Rash    Trouble breathing  . Naproxen Sodium Rash    Trouble breathing  . Aspirin     REACTION: burns stomach PT STATES SHE DOES TOLERATE THE 81 MG ASPIRIN DAILY  . Atorvastatin     REACTION:  muscle pain  . Buspar [Buspirone Hcl]     Multiple side eff  . Gabapentin Other (See Comments)    Palpitations/ dizziness   . Metformin And Related Diarrhea  . Norco [Hydrocodone-Acetaminophen] Other (See Comments)    Dizzy and problems remembering   . Simvastatin     REACTION: GI side eff and swelling  . Sulfa Antibiotics Other (See Comments)    Severe joint pain  . Sulfonamide Derivatives     REACTION: reaction not known   Current Outpatient Prescriptions on File Prior to Visit  Medication Sig Dispense Refill  . ACCU-CHEK FASTCLIX LANCETS MISC Use to test blood sugar 4vtimes daily. Dx: E11.65 408 each 3  . acetaminophen (TYLENOL) 325 MG tablet Take 2 tablets (650 mg total) by mouth every 6 (six) hours as needed.    Marland Kitchen aspirin 81 MG tablet Take 81 mg by mouth daily.    . Blood Glucose Monitoring Suppl (ACCU-CHEK NANO SMARTVIEW) W/DEVICE KIT Use to test blood sugar daily. 1 kit 0  . glipiZIDE (GLUCOTROL XL) 5 MG 24 hr tablet Take 1 tablet (5 mg total) by mouth daily. 180 tablet 1  . glucose blood (ACCU-CHEK SMARTVIEW) test strip Use to test blood sugar 2 times daily as instructed. Dx:E11.65 200 each 3  . glucose blood test strip 1 each 2 (two) times daily. Check blood sugar twice daily and as needed.    . hydrochlorothiazide (HYDRODIURIL) 25 MG tablet TAKE 1 TABLET EVERY DAY 90 tablet 3  . lisinopril (PRINIVIL,ZESTRIL) 10 MG tablet TAKE 1 TABLET EVERY DAY 90 tablet 3  . metFORMIN (GLUCOPHAGE-XR) 500 MG 24 hr tablet Take by mouth 2 tablets in pm 270 tablet 3  . omeprazole (PRILOSEC) 20 MG capsule TAKE 1 CAPSULE EVERY DAY 90 capsule 0  . PROAIR HFA 108 (90 BASE) MCG/ACT inhaler INHALE 2 PUFFS INTO THE LUNGS EVERY 6 (SIX) HOURS AS NEEDED. 8.5 Inhaler 4   No current facility-administered medications on file prior to visit.     Review of Systems Review of Systems  Constitutional: Negative for fever, appetite change,  and unexpected weight change.  ENT pos for cong and rhinorrhea and  drip and sinus (facial) pain  Eyes: Negative for pain and visual disturbance.  Respiratory: Negative for cough and shortness of breath.   Cardiovascular: Negative for cp or palpitations    Gastrointestinal: Negative  for nausea, diarrhea and constipation.  Genitourinary: Negative for urgency and frequency.  Skin: Negative for pallor or rash   Neurological: Negative for weakness, light-headedness, numbness and headaches.  Hematological: Negative for adenopathy. Does not bruise/bleed easily.  Psychiatric/Behavioral: Negative for dysphoric mood. The patient is not nervous/anxious.         Objective:   Physical Exam  Constitutional: She appears well-developed and well-nourished. No distress.  HENT:  Head: Normocephalic and atraumatic.  Right Ear: External ear normal.  Left Ear: External ear normal.  Mouth/Throat: Oropharynx is clear and moist. No oropharyngeal exudate.  Nares are injected and congested  Bilateral maxillary sinus tenderness  Post nasal drip   Eyes: Conjunctivae and EOM are normal. Pupils are equal, round, and reactive to light. Right eye exhibits no discharge. Left eye exhibits no discharge.  Neck: Normal range of motion. Neck supple.  Cardiovascular: Normal rate, regular rhythm and normal heart sounds.   Pulmonary/Chest: Effort normal and breath sounds normal. No respiratory distress. She has no wheezes. She has no rales.  Abdominal: Soft. Bowel sounds are normal. She exhibits no distension. There is tenderness. There is no rebound.  No cva tenderness  Mild suprapubic tenderness  Musculoskeletal: She exhibits no edema.  Lymphadenopathy:    She has no cervical adenopathy.  Neurological: She is alert. No cranial nerve deficit.  Skin: Skin is warm and dry. No rash noted.  Psychiatric: She has a normal mood and affect.          Assessment & Plan:   Problem List Items Addressed This Visit      Respiratory   Acute bacterial sinusitis    Cover with  levaquin Fluids/rest Disc symptomatic care - see instructions on AVS   Update if not starting to improve in a week or if worsening        Relevant Medications   levofloxacin (LEVAQUIN) 500 MG tablet     Genitourinary   UTI (urinary tract infection) - Primary    Cover with levaquin cx pending  Enc water intake Update if not starting to improve in a week or if worsening        Relevant Orders   Urine culture (Completed)    Other Visit Diagnoses    Dysuria        Relevant Orders    POCT urinalysis dipstick (Completed)

## 2015-05-13 NOTE — Patient Instructions (Signed)
Take levaquin for sinus infection and uti  Drink lots of water  You can try warm compresses on your face and breathe steam   Update if not starting to improve in a week or if worsening    We will culture your urine and update you when that returns

## 2015-05-13 NOTE — Progress Notes (Signed)
Pre visit review using our clinic review tool, if applicable. No additional management support is needed unless otherwise documented below in the visit note. 

## 2015-05-14 LAB — URINE CULTURE

## 2015-05-15 NOTE — Assessment & Plan Note (Signed)
Cover with levaquin cx pending  Enc water intake Update if not starting to improve in a week or if worsening

## 2015-05-15 NOTE — Assessment & Plan Note (Signed)
Cover with levaquin Fluids/rest Disc symptomatic care - see instructions on AVS   Update if not starting to improve in a week or if worsening

## 2015-05-16 ENCOUNTER — Telehealth: Payer: Self-pay | Admitting: *Deleted

## 2015-05-16 MED ORDER — AMOXICILLIN 500 MG PO CAPS: 500.0000 mg | ORAL_CAPSULE | Freq: Three times a day (TID) | ORAL | Status: DC

## 2015-05-16 NOTE — Telephone Encounter (Signed)
-----   Message from Abner Greenspan, MD sent at 05/16/2015 12:24 PM EDT ----- Since she has already had 3 D of abx I think she can just go ahead and stop that (levaquin) Then we can proceed with amoxicillin for the sinuses  Start back on metformin   Please call in amoxicillin 500 mg 1 po tid for 7 d #21 , 0 refills -if any problems let me know  It does not look like she is all to pcn  If dizziness does not improve please alert me

## 2015-05-16 NOTE — Telephone Encounter (Signed)
Pt notified of Dr. Marliss Coots instructions and verbalized understanding. Pt isn't allergic to amoxicillin so Rx sent to pharmacy and pt notified to update [place] if sxs don't improve

## 2015-05-19 ENCOUNTER — Other Ambulatory Visit: Payer: Self-pay | Admitting: Family Medicine

## 2015-05-26 ENCOUNTER — Telehealth: Payer: Self-pay | Admitting: Internal Medicine

## 2015-05-26 MED ORDER — METFORMIN HCL ER 500 MG PO TB24: ORAL_TABLET | ORAL | Status: DC

## 2015-05-26 NOTE — Telephone Encounter (Signed)
Patient called and would like her Rx sent to her pharmacy  Rx: Metformin   Pharmacy: [facility]   Thank you

## 2015-05-26 NOTE — Telephone Encounter (Signed)
Rx refill sent to [facility]. 

## 2015-05-27 NOTE — Telephone Encounter (Signed)
Pt called to see why omeprazole was denied refill; still has one month of omeprazole left; pt will contact pharmacy closer to time of need of med to get refilled.

## 2015-06-03 ENCOUNTER — Encounter: Payer: Self-pay | Admitting: Family Medicine

## 2015-06-03 ENCOUNTER — Ambulatory Visit (INDEPENDENT_AMBULATORY_CARE_PROVIDER_SITE_OTHER): Payer: Commercial Managed Care - HMO | Admitting: Family Medicine

## 2015-06-03 VITALS — BP 140/79 | HR 85 | Temp 97.6°F | Ht 61.0 in | Wt 186.8 lb

## 2015-06-03 DIAGNOSIS — J309 Allergic rhinitis, unspecified: Secondary | ICD-10-CM

## 2015-06-03 DIAGNOSIS — N3289 Other specified disorders of bladder: Secondary | ICD-10-CM

## 2015-06-03 DIAGNOSIS — R300 Dysuria: Secondary | ICD-10-CM | POA: Diagnosis not present

## 2015-06-03 DIAGNOSIS — Z23 Encounter for immunization: Secondary | ICD-10-CM | POA: Diagnosis not present

## 2015-06-03 DIAGNOSIS — H6983 Other specified disorders of Eustachian tube, bilateral: Secondary | ICD-10-CM

## 2015-06-03 DIAGNOSIS — H6993 Unspecified Eustachian tube disorder, bilateral: Secondary | ICD-10-CM

## 2015-06-03 DIAGNOSIS — H6980 Other specified disorders of Eustachian tube, unspecified ear: Secondary | ICD-10-CM | POA: Insufficient documentation

## 2015-06-03 LAB — POCT URINALYSIS DIPSTICK
Bilirubin, UA: NEGATIVE
Blood, UA: NEGATIVE
Glucose, UA: NEGATIVE
KETONES UA: NEGATIVE
LEUKOCYTES UA: NEGATIVE
NITRITE UA: NEGATIVE
PH UA: 6
Protein, UA: NEGATIVE
Spec Grav, UA: 1.02
UROBILINOGEN UA: 0.2

## 2015-06-03 NOTE — Progress Notes (Signed)
   Subjective:    Patient ID: Erin Beck, female    DOB: Oct 15, 1940, 74 y.o.   MRN: [id number]  Dysuria  This is a new problem. The current episode started in the past 7 days (2 days). The problem occurs every urination. The problem has been gradually worsening. The quality of the pain is described as burning. The pain is moderate. There has been no fever. She is not sexually active. Associated symptoms include frequency and nausea. Pertinent negatives include no chills, flank pain, hematuria, hesitancy, urgency or vomiting. Associated symptoms comments: Lower abdominal pain. She has tried increased fluids and antibiotics for the symptoms. Her past medical history is significant for recurrent UTIs. There is no history of catheterization, kidney stones, a single kidney, urinary stasis or a urological procedure.    Seen 3 weeks ago by Dr. Glori Bickers  05/13/2015: Treated UTi and sinus infection with Levaquin.  Had SE so changed it to amoxicillin. Symptoms resolved completely, no further dysuria. Urine cx: negative  She is also having nasal congestion Review of Systems  Constitutional: Negative for fever, chills and fatigue.  HENT: Negative for ear pain.   Eyes: Negative for pain.  Respiratory: Negative for chest tightness and shortness of breath.   Cardiovascular: Negative for chest pain, palpitations and leg swelling.  Gastrointestinal: Positive for nausea. Negative for vomiting and abdominal pain.  Genitourinary: Positive for dysuria and frequency. Negative for hesitancy, urgency, hematuria and flank pain.  Neurological: Positive for dizziness.       Objective:   Physical Exam  Constitutional: Vital signs are normal. She appears well-developed and well-nourished. She is cooperative.  Non-toxic appearance. She does not appear ill. No distress.  HENT:  Head: Normocephalic.  Right Ear: Hearing, tympanic membrane, external ear and ear canal normal. Tympanic membrane is not erythematous, not  retracted and not bulging.  Left Ear: Hearing, tympanic membrane, external ear and ear canal normal. Tympanic membrane is not erythematous, not retracted and not bulging.  Nose: No mucosal edema or rhinorrhea. Right sinus exhibits no maxillary sinus tenderness and no frontal sinus tenderness. Left sinus exhibits no maxillary sinus tenderness and no frontal sinus tenderness.  Mouth/Throat: Uvula is midline, oropharynx is clear and moist and mucous membranes are normal.  Eyes: Conjunctivae, EOM and lids are normal. Pupils are equal, round, and reactive to light. Lids are everted and swept, no foreign bodies found.  Neck: Trachea normal and normal range of motion. Neck supple. Carotid bruit is not present. No thyroid mass and no thyromegaly present.  Cardiovascular: Normal rate, regular rhythm, S1 normal, S2 normal, normal heart sounds, intact distal pulses and normal pulses.  Exam reveals no gallop and no friction rub.   No murmur heard. Pulmonary/Chest: Effort normal and breath sounds normal. No tachypnea. No respiratory distress. She has no decreased breath sounds. She has no wheezes. She has no rhonchi. She has no rales.  Abdominal: Soft. Normal appearance and bowel sounds are normal. There is no hepatosplenomegaly. There is tenderness in the suprapubic area. There is no rigidity, no guarding and no CVA tenderness. No hernia.  Neurological: She is alert.  Skin: Skin is warm, dry and intact. No rash noted.  Psychiatric: Her speech is normal and behavior is normal. Judgment and thought content normal. Her mood appears not anxious. Cognition and memory are normal. She does not exhibit a depressed mood.          Assessment & Plan:

## 2015-06-03 NOTE — Patient Instructions (Signed)
Start zyrtec (or generic ceterizine) and Mucinex plain. Start flonase 2 sprays per nostril daily.  Push fluids, lots of water.  Avoid bladder irritants, low acidic diet.  Call Dr. Glori Bickers if not timproving as expected in next 2 weeks.

## 2015-06-03 NOTE — Assessment & Plan Note (Signed)
UA clear, last culture negative.  Likely due to irritation and urge incontinence. Push fluids, lots of water.  Avoid bladder irritants, low acidic diet.  Call Dr. Glori Bickers if not timproving as expected in next 2 weeks.

## 2015-06-03 NOTE — Assessment & Plan Note (Signed)
Due to allergies, likely causing dizziness .  Start zyrtec (or generic ceterizine) and Mucinex plain. Start flonase 2 sprays per nostril daily.

## 2015-06-03 NOTE — Progress Notes (Signed)
Pre visit review using our clinic review tool, if applicable. No additional management support is needed unless otherwise documented below in the visit note. 

## 2015-06-09 ENCOUNTER — Telehealth: Payer: Self-pay | Admitting: Family Medicine

## 2015-06-09 DIAGNOSIS — I10 Essential (primary) hypertension: Secondary | ICD-10-CM

## 2015-06-09 DIAGNOSIS — E785 Hyperlipidemia, unspecified: Secondary | ICD-10-CM

## 2015-06-09 DIAGNOSIS — IMO0001 Reserved for inherently not codable concepts without codable children: Secondary | ICD-10-CM

## 2015-06-09 DIAGNOSIS — E1165 Type 2 diabetes mellitus with hyperglycemia: Secondary | ICD-10-CM

## 2015-06-09 NOTE — Telephone Encounter (Signed)
-----   Message from Ellamae Sia sent at 06/03/2015 11:59 AM EDT ----- Regarding: Lab orders for Friday, 10.21.16 Patient is scheduled for CPX labs, please order future labs, Thanks , Karna Christmas

## 2015-06-10 ENCOUNTER — Other Ambulatory Visit (INDEPENDENT_AMBULATORY_CARE_PROVIDER_SITE_OTHER): Payer: Commercial Managed Care - HMO

## 2015-06-10 ENCOUNTER — Other Ambulatory Visit: Payer: Self-pay | Admitting: *Deleted

## 2015-06-10 DIAGNOSIS — IMO0001 Reserved for inherently not codable concepts without codable children: Secondary | ICD-10-CM

## 2015-06-10 DIAGNOSIS — I10 Essential (primary) hypertension: Secondary | ICD-10-CM

## 2015-06-10 DIAGNOSIS — E785 Hyperlipidemia, unspecified: Secondary | ICD-10-CM | POA: Diagnosis not present

## 2015-06-10 DIAGNOSIS — E1165 Type 2 diabetes mellitus with hyperglycemia: Secondary | ICD-10-CM | POA: Diagnosis not present

## 2015-06-10 LAB — CBC WITH DIFFERENTIAL/PLATELET
BASOS PCT: 0.5 % (ref 0.0–3.0)
Basophils Absolute: 0 10*3/uL (ref 0.0–0.1)
EOS PCT: 5.5 % — AB (ref 0.0–5.0)
Eosinophils Absolute: 0.5 10*3/uL (ref 0.0–0.7)
HEMATOCRIT: 43.5 % (ref 36.0–46.0)
HEMOGLOBIN: 14.5 g/dL (ref 12.0–15.0)
LYMPHS PCT: 18 % (ref 12.0–46.0)
Lymphs Abs: 1.5 10*3/uL (ref 0.7–4.0)
MCHC: 33.3 g/dL (ref 30.0–36.0)
MCV: 91.8 fl (ref 78.0–100.0)
MONO ABS: 0.6 10*3/uL (ref 0.1–1.0)
MONOS PCT: 7.2 % (ref 3.0–12.0)
Neutro Abs: 5.8 10*3/uL (ref 1.4–7.7)
Neutrophils Relative %: 68.8 % (ref 43.0–77.0)
Platelets: 227 10*3/uL (ref 150.0–400.0)
RBC: 4.74 Mil/uL (ref 3.87–5.11)
RDW: 13.1 % (ref 11.5–15.5)
WBC: 8.5 10*3/uL (ref 4.0–10.5)

## 2015-06-10 LAB — COMPREHENSIVE METABOLIC PANEL
ALBUMIN: 4.2 g/dL (ref 3.5–5.2)
ALK PHOS: 59 U/L (ref 39–117)
ALT: 48 U/L — ABNORMAL HIGH (ref 0–35)
AST: 25 U/L (ref 0–37)
BUN: 12 mg/dL (ref 6–23)
CALCIUM: 9.6 mg/dL (ref 8.4–10.5)
CHLORIDE: 99 meq/L (ref 96–112)
CO2: 31 mEq/L (ref 19–32)
Creatinine, Ser: 0.84 mg/dL (ref 0.40–1.20)
GFR: 70.42 mL/min (ref >60.00)
Glucose, Bld: 150 mg/dL — ABNORMAL HIGH (ref 70–99)
POTASSIUM: 4 meq/L (ref 3.5–5.1)
SODIUM: 139 meq/L (ref 135–145)
TOTAL PROTEIN: 7.1 g/dL (ref 6.0–8.3)
Total Bilirubin: 0.5 mg/dL (ref 0.2–1.2)

## 2015-06-10 LAB — TSH: TSH: 2.67 u[IU]/mL (ref 0.35–4.50)

## 2015-06-10 LAB — LIPID PANEL
CHOLESTEROL: 223 mg/dL — AB (ref 0–200)
HDL: 36.5 mg/dL — AB (ref >39.00)
NonHDL: 186.35
Total CHOL/HDL Ratio: 6
Triglycerides: 268 mg/dL — ABNORMAL HIGH (ref 0.0–149.0)
VLDL: 53.6 mg/dL — ABNORMAL HIGH (ref 0.0–40.0)

## 2015-06-10 LAB — HEMOGLOBIN A1C: HEMOGLOBIN A1C: 6.4 % (ref 4.6–6.5)

## 2015-06-10 LAB — LDL CHOLESTEROL, DIRECT: LDL DIRECT: 154 mg/dL

## 2015-06-10 MED ORDER — OMEPRAZOLE 20 MG PO CPDR: 20.0000 mg | DELAYED_RELEASE_CAPSULE | Freq: Every day | ORAL | Status: DC

## 2015-06-10 NOTE — Telephone Encounter (Signed)
Patient due for annual exam.  Scheduled 06/14/15.  90 day supply sent.

## 2015-06-14 ENCOUNTER — Encounter: Payer: Self-pay | Admitting: Family Medicine

## 2015-06-14 ENCOUNTER — Other Ambulatory Visit: Payer: Self-pay | Admitting: Internal Medicine

## 2015-06-14 ENCOUNTER — Ambulatory Visit (INDEPENDENT_AMBULATORY_CARE_PROVIDER_SITE_OTHER): Payer: Commercial Managed Care - HMO | Admitting: Family Medicine

## 2015-06-14 VITALS — BP 110/62 | HR 77 | Temp 97.8°F | Ht 61.0 in | Wt 184.8 lb

## 2015-06-14 DIAGNOSIS — Z0000 Encounter for general adult medical examination without abnormal findings: Secondary | ICD-10-CM

## 2015-06-14 DIAGNOSIS — E669 Obesity, unspecified: Secondary | ICD-10-CM

## 2015-06-14 DIAGNOSIS — H9113 Presbycusis, bilateral: Secondary | ICD-10-CM

## 2015-06-14 DIAGNOSIS — E1165 Type 2 diabetes mellitus with hyperglycemia: Secondary | ICD-10-CM | POA: Diagnosis not present

## 2015-06-14 DIAGNOSIS — I10 Essential (primary) hypertension: Secondary | ICD-10-CM | POA: Diagnosis not present

## 2015-06-14 DIAGNOSIS — Z23 Encounter for immunization: Secondary | ICD-10-CM | POA: Diagnosis not present

## 2015-06-14 DIAGNOSIS — IMO0001 Reserved for inherently not codable concepts without codable children: Secondary | ICD-10-CM

## 2015-06-14 DIAGNOSIS — E2839 Other primary ovarian failure: Secondary | ICD-10-CM

## 2015-06-14 DIAGNOSIS — H9110 Presbycusis, unspecified ear: Secondary | ICD-10-CM | POA: Insufficient documentation

## 2015-06-14 NOTE — Progress Notes (Signed)
Subjective:    Patient ID: Erin Beck, female    DOB: 1941/06/19, 74 y.o.   MRN: [id number]  HPI Here for annual medicare wellness visit as well as chronic/acute medical problems with annual preventative exam  I have personally reviewed the Medicare Annual Wellness questionnaire and have noted 1. The patient's medical and social history 2. Their use of alcohol, tobacco or illicit drugs 3. Their current medications and supplements 4. The patient's functional ability including ADL's, fall risks, home safety risks and hearing or visual             impairment. 5. Diet and physical activities 6. Evidence for depression or mood disorders  The patients weight, height, BMI have been recorded in the chart and visual acuity is per eye clinic.  I have made referrals, counseling and provided education to the patient based review of the above and I have provided the pt with a written personalized care plan for preventive services. Reviewed and updated provider list, see scanned forms.  Has been doing pretty well overall  Has cut back on caffeine and spices or anything that could bother her bladder  Also avoiding artificial sweetener     See scanned forms.  Routine anticipatory guidance given to patient.  See health maintenance. Colon cancer screening 4/08 - tics and hemorrhoids -10 year recall - if she wants to  Breast cancer screening mammogram 7/16 nl  Self breast exam-no lumps  Flu vaccine 10/16  Tetanus vaccine 11/04 - will look into getting one  Pneumovax - had the 23 in 09, due for prevnar  Zoster vaccine- 10/15  dexa 3/14 -was improved / no falls or fx since 5/15 (broke elbow) , taking her ca and D   Advance directive has a living will and poa  Cognitive function addressed- see scanned forms- and if abnormal then additional documentation follows.  No worries about memory  Failed hearing exam today - she does not realize this / no problems with hearing at home -not bothersome    Does not think she would get a hearing aide   PMH and SH reviewed  Meds, vitals, and allergies reviewed.   ROS: See HPI.  Otherwise negative.    bp is stable today  No cp or palpitations or headaches or edema  No side effects to medicines  BP Readings from Last 3 Encounters:  06/14/15 110/62  06/03/15 140/79  05/13/15 122/66     Wt is down 2 lb with bmi of 34 Exercise is limited/ she does her best   DM-sees Dr Cruzita Lederer Lab Results  Component Value Date   HGBA1C 6.4 06/10/2015   Stable Sees Dr Darnell Level Thursday    Hyperlipidemia Intol to all meds tried Lab Results  Component Value Date   CHOL 223* 06/10/2015   CHOL 230* 06/08/2014   CHOL 227* 03/09/2014   Lab Results  Component Value Date   HDL 36.50* 06/10/2015   HDL 30.80* 06/08/2014   HDL 40.10 03/09/2014   Lab Results  Component Value Date   LDLCALC 127* 03/09/2014   LDLCALC * 11/16/2009    132        Total Cholesterol/HDL:CHD Risk Coronary Heart Disease Risk Table                     Men   Women  1/2 Average Risk   3.4   3.3  Average Risk       5.0   4.4  2 X Average  Risk   9.6   7.1  3 X Average Risk  23.4   11.0        Use the calculated Patient Ratio above and the CHD Risk Table to determine the patient's CHD Risk.        ATP III CLASSIFICATION (LDL):  <100     mg/dL   Optimal  100-129  mg/dL   Near or Above                    Optimal  130-159  mg/dL   Borderline  160-189  mg/dL   High  >190     mg/dL   Very High   Lab Results  Component Value Date   TRIG 268.0* 06/10/2015   TRIG 275.0* 06/08/2014   TRIG 300.0* 03/09/2014   Lab Results  Component Value Date   CHOLHDL 6 06/10/2015   CHOLHDL 7 06/08/2014   CHOLHDL 6 03/09/2014   Lab Results  Component Value Date   LDLDIRECT 154.0 06/10/2015   LDLDIRECT 153.7 06/08/2014   LDLDIRECT 161.8 10/05/2013   Overall trig improved slt  HDL almost at goal LDL is up - fairly stable  Stays away from fatty foods and fried foods  Avoids beef     Patient Active Problem List   Diagnosis Date Noted  . Routine general medical examination at a health care facility 06/14/2015  . Eustachian tube dysfunction 06/03/2015  . Bladder spasm 06/03/2015  . Eyelid twitch 12/07/2014  . 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 Local reaction to immunization 06/16/2014  . Chronic neck and back pain 06/15/2014  . Acute ear pain 06/10/2014  . Cephalalgia 06/10/2014  . Right shoulder pain 03/12/2014  . Vitreomacular adhesion 05/05/2013  . Unspecified asthma(493.90) 04/23/2013  . Encounter for Medicare annual wellness exam 03/03/2013  . Personal history of colonic polyps 03/03/2013  . Post-menopausal 10/17/2012  . Obesity 10/31/2011  . Other screening mammogram 10/31/2011  . IBS (irritable bowel syndrome) 08/10/2011  . GERD 07/31/2010  . Allergic rhinitis 12/05/2007  . HYPERTENSION, BENIGN ESSENTIAL 02/05/2007  . TOBACCO USE, QUIT 02/05/2007  . Diabetes type 2, uncontrolled ([facility]) 01/31/2007  . Hyperlipidemia 01/31/2007  . ANXIETY 01/31/2007  . OSTEOARTHRITIS 01/31/2007  . DIVERTICULITIS, HX OF 01/31/2007   Past Medical History  Diagnosis Date  . Anxiety   . Diabetes mellitus     type II  . Diverticulitis     Hx of  . Hyperlipidemia   . Arthritis     OA  . Osteopenia   . GERD (gastroesophageal reflux disease)   . Hypertension   . Allergic rhinitis   . IBS (irritable bowel syndrome)   . Hematuria     HX OF MICROSCOPIC BLOOD IN URINE AND HX OF CYST ON ONE KIDNEY--BUT NO KIDNEY DISEASE  . Asthma   . Full dentures   . Wears glasses   . Complication of anesthesia     gas bubble rt eye from an old retinal detachment   Past Surgical History  Procedure Laterality Date  . Abdominal hysterectomy    . Cholecystectomy    . Knee arthroscopy  1990    chondromalacia   . Total knee arthroplasty  12/05/2011    Procedure: TOTAL KNEE ARTHROPLASTY;  Surgeon: Magnus Sinning, MD;  Location: [facility];  Service: Orthopedics;  Laterality:  Right;  . Appendectomy      per pt  . Joint replacement  2000    LEFT TOTAL KNEE ARTHROPLASTY  .  Joint replacement  2013    rt total knee  . Colonoscopy    . Carpal tunnel release      both hands  . Eye surgery      BILATERAL CATARACTS REMOVED  . Eye surgery      gas bubble in rt eye from a retinal detachment  . Ulnar nerve transposition Right 09/15/2014    Procedure: Right cubital tunnel release with transposition of ulnar nerve;  Surgeon: Naiping Michael Xu, MD;  Location: [facility];  Service: Orthopedics;  Laterality: Right;   Social History  Substance Use Topics  . Smoking status: Former Smoker -- 1.00 packs/day for 48 years    Quit date: 08/20/2009  . Smokeless tobacco: Never Used  . Alcohol Use: No   Family History  Problem Relation Age of Onset  . Diabetes Father   . Diabetes Brother    Allergies  Allergen Reactions  . Naproxen Rash    Trouble breathing  . Naproxen Sodium Rash    Trouble breathing  . Aspirin     REACTION: burns stomach PT STATES SHE DOES TOLERATE THE 81 MG ASPIRIN DAILY  . Atorvastatin     REACTION: muscle pain  . Buspar [Buspirone Hcl]     Multiple side eff  . Gabapentin Other (See Comments)    Palpitations/ dizziness   . Metformin And Related Diarrhea  . Norco [Hydrocodone-Acetaminophen] Other (See Comments)    Dizzy and problems remembering   . Simvastatin     REACTION: GI side eff and swelling  . Sulfa Antibiotics Other (See Comments)    Severe joint pain  . Sulfonamide Derivatives     REACTION: reaction not known   Current Outpatient Prescriptions on File Prior to Visit  Medication Sig Dispense Refill  . ACCU-CHEK FASTCLIX LANCETS MISC Use to test blood sugar 4vtimes daily. Dx: E11.65 408 each 3  . acetaminophen (TYLENOL) 325 MG tablet Take 2 tablets (650 mg total) by mouth every 6 (six) hours as needed.    . aspirin 81 MG tablet Take 81 mg by mouth daily.    . Blood Glucose Monitoring Suppl (ACCU-CHEK NANO  SMARTVIEW) W/DEVICE KIT Use to test blood sugar daily. 1 kit 0  . glucose blood (ACCU-CHEK SMARTVIEW) test strip Use to test blood sugar 2 times daily as instructed. Dx:E11.65 200 each 3  . glucose blood test strip 1 each 2 (two) times daily. Check blood sugar twice daily and as needed.    . hydrochlorothiazide (HYDRODIURIL) 25 MG tablet TAKE 1 TABLET EVERY DAY 90 tablet 3  . lisinopril (PRINIVIL,ZESTRIL) 10 MG tablet TAKE 1 TABLET EVERY DAY 90 tablet 3  . metFORMIN (GLUCOPHAGE-XR) 500 MG 24 hr tablet Take by mouth 2 tablets in pm 180 tablet 1  . omeprazole (PRILOSEC) 20 MG capsule Take 1 capsule (20 mg total) by mouth daily. 90 capsule 0  . PROAIR HFA 108 (90 BASE) MCG/ACT inhaler INHALE 2 PUFFS INTO THE LUNGS EVERY 6 (SIX) HOURS AS NEEDED. 8.5 Inhaler 4   No current facility-administered medications on file prior to visit.    Review of Systems Review of Systems  Constitutional: Negative for fever, appetite change, fatigue and unexpected weight change.  Eyes: Negative for pain and visual disturbance.  Respiratory: Negative for cough and shortness of breath.   Cardiovascular: Negative for cp or palpitations    Gastrointestinal: Negative for nausea, diarrhea and constipation.  Genitourinary: Negative for urgency and pos for baseline frequency/ pos for occ dysuria  Skin:   Negative for pallor or rash   Neurological: Negative for weakness, light-headedness, numbness and headaches.  Hematological: Negative for adenopathy. Does not bruise/bleed easily.  Psychiatric/Behavioral: Negative for dysphoric mood. The patient is not nervous/anxious.         Objective:   Physical Exam  Constitutional: She appears well-developed and well-nourished. No distress.  obese and well appearing   HENT:  Head: Normocephalic and atraumatic.  Right Ear: External ear normal.  Left Ear: External ear normal.  Mouth/Throat: Oropharynx is clear and moist.  Eyes: Conjunctivae and EOM are normal. Pupils are equal,  round, and reactive to light. No scleral icterus.  Neck: Normal range of motion. Neck supple. No JVD present. Carotid bruit is not present. No thyromegaly present.  Cardiovascular: Normal rate, regular rhythm, normal heart sounds and intact distal pulses.  Exam reveals no gallop.   Pulmonary/Chest: Effort normal and breath sounds normal. No respiratory distress. She has no wheezes. She exhibits no tenderness.  Abdominal: Soft. Bowel sounds are normal. She exhibits no distension, no abdominal bruit and no mass. There is no tenderness.  Genitourinary: No breast swelling, tenderness, discharge or bleeding.  Breast exam: No mass, nodules, thickening, tenderness, bulging, retraction, inflamation, nipple discharge or skin changes noted.  No axillary or clavicular LA.      Musculoskeletal: Normal range of motion. She exhibits no edema or tenderness.  Lymphadenopathy:    She has no cervical adenopathy.  Neurological: She is alert. She has normal reflexes. No cranial nerve deficit. She exhibits normal muscle tone. Coordination normal.  R eyelid occasionally twitches   Skin: Skin is warm and dry. No rash noted. No erythema. No pallor.  Small comedone expressed on L back upper   Psychiatric: She has a normal mood and affect.          Assessment & Plan:   Problem List Items Addressed This Visit      Cardiovascular and Mediastinum   HYPERTENSION, BENIGN ESSENTIAL    bp in fair control at this time  BP Readings from Last 1 Encounters:  06/14/15 110/62   No changes needed Disc lifstyle change with low sodium diet and exercise  Labs reviewed         Endocrine   Diabetes type 2, uncontrolled ([facility])    Lab Results  Component Value Date   HGBA1C 6.4 06/10/2015   F/u with Dr Gherghe upcoming          Nervous and Auditory   Hearing loss of aging    Failed hearing exam Hearing loss is not bothering her enough for further eval at this time- she will let us know when she is ready         Other   Encounter for Medicare annual wellness exam - Primary    Also reviewed health mt list, fam hx and immunization status , as well as social and family history   See HPI Labs reviewed You are due for a tetanus vaccine - take a look at the handout I gave you about it  prevnar vaccine today (pneumonia booster) Stop at check out to schedule a bone density test  Your hearing test was not good- if hearing loss starts to bother you we will refer you for a formal evaluation  Continue your calcium and vitamin D Take care of yourself and stay active       Estrogen deficiency    Ref for f/u dexa for osteopenia  One fall/fx in the past year         Relevant Orders   DG Bone Density   Obesity    Discussed how this problem influences overall health and the risks it imposes  Reviewed plan for weight loss with lower calorie diet (via better food choices and also portion control or program like weight watchers) and exercise building up to or more than 30 minutes 5 days per week including some aerobic activity         Routine general medical examination at a health care facility    Reviewed health habits including diet and exercise and skin cancer prevention Reviewed appropriate screening tests for age  Also reviewed health mt list, fam hx and immunization status , as well as social and family history   See HPI Labs reviewed You are due for a tetanus vaccine - take a look at the handout I gave you about it  prevnar vaccine today (pneumonia booster) Stop at check out to schedule a bone density test  Your hearing test was not good- if hearing loss starts to bother you we will refer you for a formal evaluation  Continue your calcium and vitamin D Take care of yourself and stay active         Other Visit Diagnoses    Need for vaccination with 13-polyvalent pneumococcal conjugate vaccine        Relevant Orders    Pneumococcal conjugate vaccine 13-valent (Completed)

## 2015-06-14 NOTE — Assessment & Plan Note (Signed)
Discussed how this problem influences overall health and the risks it imposes  Reviewed plan for weight loss with lower calorie diet (via better food choices and also portion control or program like weight watchers) and exercise building up to or more than 30 minutes 5 days per week including some aerobic activity    

## 2015-06-14 NOTE — Progress Notes (Signed)
Pre visit review using our clinic review tool, if applicable. No additional management support is needed unless otherwise documented below in the visit note. 

## 2015-06-14 NOTE — Assessment & Plan Note (Signed)
Also reviewed health mt list, fam hx and immunization status , as well as social and family history   See HPI Labs reviewed You are due for a tetanus vaccine - take a look at the handout I gave you about it  prevnar vaccine today (pneumonia booster) Stop at check out to schedule a bone density test  Your hearing test was not good- if hearing loss starts to bother you we will refer you for a formal evaluation  Continue your calcium and vitamin D Take care of yourself and stay active

## 2015-06-14 NOTE — Assessment & Plan Note (Signed)
Ref for f/u dexa for osteopenia  One fall/fx in the past year

## 2015-06-14 NOTE — Assessment & Plan Note (Signed)
Lab Results  Component Value Date   HGBA1C 6.4 06/10/2015   F/u with Dr Cruzita Lederer upcoming

## 2015-06-14 NOTE — Assessment & Plan Note (Signed)
bp in fair control at this time  BP Readings from Last 1 Encounters:  06/14/15 110/62   No changes needed Disc lifstyle change with low sodium diet and exercise  Labs reviewed

## 2015-06-14 NOTE — Patient Instructions (Signed)
You are due for a tetanus vaccine - take a look at the handout I gave you about it  prevnar vaccine today (pneumonia booster) Stop at check out to schedule a bone density test  Your hearing test was not good- if hearing loss starts to bother you we will refer you for a formal evaluation  Continue your calcium and vitamin D Take care of yourself and stay active

## 2015-06-14 NOTE — Assessment & Plan Note (Signed)
Failed hearing exam Hearing loss is not bothering her enough for further eval at this time- she will let us know when she is ready

## 2015-06-14 NOTE — Assessment & Plan Note (Signed)
Reviewed health habits including diet and exercise and skin cancer prevention Reviewed appropriate screening tests for age  Also reviewed health mt list, fam hx and immunization status , as well as social and family history   See HPI Labs reviewed You are due for a tetanus vaccine - take a look at the handout I gave you about it  prevnar vaccine today (pneumonia booster) Stop at check out to schedule a bone density test  Your hearing test was not good- if hearing loss starts to bother you we will refer you for a formal evaluation  Continue your calcium and vitamin D Take care of yourself and stay active

## 2015-06-17 ENCOUNTER — Ambulatory Visit (INDEPENDENT_AMBULATORY_CARE_PROVIDER_SITE_OTHER): Payer: Commercial Managed Care - HMO | Admitting: Internal Medicine

## 2015-06-17 ENCOUNTER — Encounter: Payer: Self-pay | Admitting: Internal Medicine

## 2015-06-17 DIAGNOSIS — E1165 Type 2 diabetes mellitus with hyperglycemia: Secondary | ICD-10-CM | POA: Diagnosis not present

## 2015-06-17 DIAGNOSIS — E1159 Type 2 diabetes mellitus with other circulatory complications: Secondary | ICD-10-CM | POA: Insufficient documentation

## 2015-06-17 NOTE — Patient Instructions (Signed)
Please move all Metformin ER (1500 mg) with dinner. Continue Glipizide XL 5 mg in am.  Please return in 4 months with your sugar log.

## 2015-06-17 NOTE — Progress Notes (Signed)
Patient ID: Erin Beck, female   DOB: 1941/04/17, 74 y.o.   MRN: [id number]  HPI: Erin Beck is a 74 y.o.-year-old female, returning for f/u for DM2, non-insulin-dependent, dx 2005, uncontrolled, without complications. Last visit 3 mo ago.  Last hemoglobin A1c was: Lab Results  Component Value Date   HGBA1C 6.4 06/10/2015   HGBA1C 6.4 03/15/2015   HGBA1C 6.8* 12/14/2014  Received labs from Dr. Erlinda Hong [address], 08/30/2014): - HbA1c 6.9%, decreased from 8.0%  Pt is on a regimen of: - Glipizide XL 5 mg in am - Metformin ER 500 mg in am and 1000 mg in pm >> diarrhea >> we tried to decrease the dose to 1000 mg at night >> sugars higher >> restarted She has not been on insulin before, would not like to stick herself.  Pt checks her sugars 1-2 a day: - am: 170-180 >> 111-154 >> 90-127 >> 100-112 >> 91-140 >> 101-130 >> 94-120 - 2h after b'fast: n/c >> 242, 273 >> 150s >> n/c >> 136-252 >> 89, 184, 231 >> 127-152 - before lunch: n/c >> 75-120 >> 118-154, 189, 209 >> 97-152 >> 79, 87-136 - 2h after lunch: n/c >> 200-210 (?) >> 115-200 >> 121-146 >> 114-150 - before dinner: n/c >> 113-153 >> n/c >> 100-130 >> 80, 95-143 >> 82-132 >> 81-102 - 2h after dinner: low 200s >> 153, 161 >> 150-160 >> 120-150 >> 123-194 >> 103-190 >> 121-177 - bedtime: n/c >> 164 >> 120-140 >> 100, 120-140 >> 105-180 >> 81, 84-122 >> 80-133 - nighttime: n/c >> 80 x1 >> 98-105 >> 91-132 >> n/c >> 88-115 No lows. Lowest sugar was 80s >> feels them;  she has hypoglycemia awareness at 80.  Highest sugar was 220 >> 160 >> 210 >> 252 >> 231 >> 177.  Pt's meals are: - Breakfast: cereals - Lunch: salads, 1 sandwich, leftovers - Dinner: salmon/chicken, vegetables - Snacks: 1 fruit, crackers - not every day Cut out soft drinks.  - no CKD, last BUN/creatinine:  Lab Results  Component Value Date   BUN 12 06/10/2015   CREATININE 0.84 06/10/2015  On Lisinopril. - last set of lipids: Lab Results  Component Value  Date   CHOL 223* 06/10/2015   HDL 36.50* 06/10/2015   LDLCALC 127* 03/09/2014   LDLDIRECT 154.0 06/10/2015   TRIG 268.0* 06/10/2015   CHOLHDL 6 06/10/2015  She was on a statin >> AP.  - last eye exam was in 07/2014. No DR. Had cataracts sx. - no numbness and tingling in her feet.  I reviewed pt's medications, allergies, PMH, social hx, family hx, and changes were documented in the history of present illness. Otherwise, unchanged from my initial visit note..  ROS: Constitutional: no weight gain, no fatigue, no subjective hyperthermia/hypothermia Eyes: no blurry vision, no xerophthalmia ENT: no sore throat, no nodules palpated in throat, no dysphagia/odynophagia, no hoarseness, + decreased hearing Cardiovascular: no CP/SOB/palpitations/leg swelling Respiratory: no cough/SOB Gastrointestinal: no N/V/+ D/no C Musculoskeletal: no muscle/joint aches Skin: no rashes Neurological: no tremors/numbness/tingling/dizziness  PE: BP 118/62 mmHg  Pulse 87  Temp(Src) 98.1 F (36.7 C) (Oral)  Resp 12  Wt 185 lb (83.915 kg)  SpO2 97% Wt Readings from Last 3 Encounters:  06/17/15 185 lb (83.915 kg)  06/14/15 184 lb 12 oz (83.802 kg)  06/03/15 186 lb 12 oz (84.709 kg)   Constitutional: overweight, in NAD, nasal congestion Eyes: PERRLA, EOMI, no exophthalmos ENT: moist mucous membranes, no thyromegaly, no cervical lymphadenopathy Cardiovascular: RRR, No  MRG Respiratory: CTA B Gastrointestinal: abdomen soft, NT, ND, BS+ Musculoskeletal: no deformities, strength intact in all 4 Skin: moist, warm, no rashes Neurological: no tremor with outstretched hands, DTR normal in all 4  ASSESSMENT: 1. DM2, non-insulin-dependent, uncontrolled, without complications  PLAN:  1. Patient with long-standing, now better controlled diabetes, on oral antidiabetic regimen, with improved control but she gets diarrhea with am Metformin >> decreased dose to 1000 mg at night at last visit. Sugars were worse after  this >> increased back to 500 in am and 1000 mg in pm. Last Hba1c was great, 6.4%! 1 week ago. - I suggested to:  Patient Instructions  Please move all Metformin ER (1500 mg) with dinner. Continue Glipizide XL 5 mg in am.  Please return in 4 months with your sugar log.    - continue checking sugars at different times of the day - check 2 times a day, rotating checks - advised for yearly eye exams >> she is Up to date - UTD with flu shot - Return to clinic in 3 mo with sugar log

## 2015-07-18 ENCOUNTER — Ambulatory Visit
Admission: RE | Admit: 2015-07-18 | Discharge: 2015-07-18 | Disposition: A | Discharge status: Home / Self Care | Payer: Commercial Managed Care - HMO | Source: Ambulatory Visit | Attending: Family Medicine | Admitting: Family Medicine

## 2015-07-18 DIAGNOSIS — Z780 Asymptomatic menopausal state: Secondary | ICD-10-CM | POA: Diagnosis not present

## 2015-07-18 DIAGNOSIS — E2839 Other primary ovarian failure: Secondary | ICD-10-CM

## 2015-07-18 LAB — HM DEXA SCAN: HM Dexa Scan: NORMAL

## 2015-07-20 ENCOUNTER — Other Ambulatory Visit: Payer: Self-pay | Admitting: Family Medicine

## 2015-07-27 ENCOUNTER — Encounter: Payer: Self-pay | Admitting: Family Medicine

## 2015-07-27 ENCOUNTER — Encounter: Payer: Self-pay | Admitting: *Deleted

## 2015-08-10 ENCOUNTER — Other Ambulatory Visit: Payer: Self-pay | Admitting: Family Medicine

## 2015-09-27 ENCOUNTER — Other Ambulatory Visit: Payer: Self-pay | Admitting: Internal Medicine

## 2015-10-18 ENCOUNTER — Encounter: Payer: Self-pay | Admitting: Internal Medicine

## 2015-10-18 ENCOUNTER — Ambulatory Visit (INDEPENDENT_AMBULATORY_CARE_PROVIDER_SITE_OTHER): Payer: Commercial Managed Care - HMO | Admitting: Internal Medicine

## 2015-10-18 VITALS — BP 100/58 | HR 83 | Temp 97.8°F | Resp 12 | Wt 186.0 lb

## 2015-10-18 DIAGNOSIS — I10 Essential (primary) hypertension: Secondary | ICD-10-CM | POA: Diagnosis not present

## 2015-10-18 DIAGNOSIS — E1165 Type 2 diabetes mellitus with hyperglycemia: Secondary | ICD-10-CM

## 2015-10-18 MED ORDER — GLIPIZIDE ER 2.5 MG PO TB24: 2.5000 mg | ORAL_TABLET | Freq: Every day | ORAL | Status: DC

## 2015-10-18 NOTE — Progress Notes (Signed)
Patient ID: Erin Beck, female   DOB: 13-Feb-1941, 75 y.o.   MRN: [id number]  HPI: Erin Beck is a 75 y.o.-year-old female, returning for f/u for DM2, non-insulin-dependent, dx 2005, uncontrolled, without complications. Last visit 4 mo ago.  Last hemoglobin A1c was: Lab Results  Component Value Date   HGBA1C 6.4 06/10/2015   HGBA1C 6.4 03/15/2015   HGBA1C 6.8* 12/14/2014  Received labs from Dr. Erlinda Hong [address], 08/30/2014): - HbA1c 6.9%, decreased from 8.0%  Pt is on a regimen of: - Glipizide XL 5 mg in am - Metformin ER 1500 mg in pm  She has not been on insulin before, would not like to stick herself.  Pt checks her sugars 1-2 a day: - am: 170-180 >> 111-154 >> 90-127 >> 100-112 >> 91-140 >> 101-130 >> 94-120 >> 80-119 - 2h after b'fast: n/c >> 242, 273 >> 150s >> n/c >> 136-252 >> 89, 184, 231 >> 127-152 >> 99-144 - before lunch: n/c >> 75-120 >> 118-154, 189, 209 >> 97-152 >> 79, 87-136 >> 87-127 - 2h after lunch: n/c >> 200-210 (?) >> 115-200 >> 121-146 >> 114-150 >> 91-149, 163 - before dinner: n/c >> 113-153 >> n/c >> 100-130 >> 80, 95-143 >> 82-132 >> 81-102 >> 91-137, 151, 171 - 2h after dinner: low 200s >> 153, 161 >> 150-160 >> 120-150 >> 123-194 >> 103-190 >> 121-177 >> 95-162 - bedtime: n/c >> 164 >> 120-140 >> 100, 120-140 >> 105-180 >> 81, 84-122 >> 80-133 >> 80-126 - nighttime: n/c >> 80 x1 >> 98-105 >> 91-132 >> n/c >> 88-115 >> 83-111 >> 75s (not in last mo) No lows. Lowest sugar was 80s >> feels them >> 75 (at night);  she has hypoglycemia awareness at 80.  Highest sugar was 220 >> 160 >> 210 >> 252 >> 231 >> 177 >> 171.  Pt's meals are: - Breakfast: cereals - Lunch: salads, 1 sandwich, leftovers - Dinner: salmon/chicken, vegetables - Snacks: 1 fruit, crackers - not every day Cut out soft drinks.  - no CKD, last BUN/creatinine:  Lab Results  Component Value Date   BUN 12 06/10/2015   CREATININE 0.84 06/10/2015  On Lisinopril. - last set of  lipids: Lab Results  Component Value Date   CHOL 223* 06/10/2015   HDL 36.50* 06/10/2015   LDLCALC 127* 03/09/2014   LDLDIRECT 154.0 06/10/2015   TRIG 268.0* 06/10/2015   CHOLHDL 6 06/10/2015  She was on a statin >> AP.  - last eye exam was in 07/2014. No DR. Had cataracts sx. - no numbness and tingling in her feet.  I reviewed pt's medications, allergies, PMH, social hx, family hx, and changes were documented in the history of present illness. Otherwise, unchanged from my initial visit note.  ROS: Constitutional: no weight gain, no fatigue, no subjective hyperthermia/hypothermia Eyes: no blurry vision, no xerophthalmia ENT: no sore throat, no nodules palpated in throat, no dysphagia/odynophagia, no hoarseness, + decreased hearing Cardiovascular: no CP/SOB/palpitations/leg swelling Respiratory: no cough/SOB Gastrointestinal: no N/V/D/C Musculoskeletal: no muscle/joint aches Skin: no rashes Neurological: no tremors/numbness/tingling/dizziness  PE: BP 100/58 mmHg  Pulse 83  Temp(Src) 97.8 F (36.6 C) (Oral)  Resp 12  Wt 186 lb (84.369 kg)  SpO2 95% Body mass index is 35.16 kg/(m^2). Wt Readings from Last 3 Encounters:  10/18/15 186 lb (84.369 kg)  06/17/15 185 lb (83.915 kg)  06/14/15 184 lb 12 oz (83.802 kg)   Constitutional: overweight, in NAD, nasal congestion Eyes: PERRLA, EOMI, no exophthalmos ENT: moist  mucous membranes, no thyromegaly, no cervical lymphadenopathy Cardiovascular: RRR, No MRG Respiratory: CTA B Gastrointestinal: abdomen soft, NT, ND, BS+ Musculoskeletal: no deformities, strength intact in all 4 Skin: moist, warm, no rashes Neurological: no tremor with outstretched hands, DTR normal in all 4  ASSESSMENT: 1. DM2, non-insulin-dependent, uncontrolled, without complications  2. HTN  PLAN:  1. Patient with long-standing, now better controlled diabetes, on oral antidiabetic regimen, with great control. She has some low CBGs in the 70s (not in the  last mo) >> will reduce the Glipizide ER to 2.5 mg daily.  Last Hba1c was great, 6.4%!  - I suggested to:  Patient Instructions  Please continue Metformin ER 500 mg in am and 1000 mg with dinner. Please decrease Glipizide XL to 2.5 mg in am.  Please return in 4 months with your sugar log.    - continue checking sugars at different times of the day - check 1-2 times a day, rotating checks - advised for yearly eye exams >> she needs a new one - check HbA1 today >> 6.0% (better) - UTD with flu shot - Return to clinic in 4 mo with sugar log (next time we can try 6 mo)   2. HTN - pt dizzy (orthostatic in the waiting room)  - BP checked in the office on the low side.  - advised to d/w PCP maybe she can reduce the HCTZ dose in half. She will give Dr Glori Bickers a call.

## 2015-10-18 NOTE — Patient Instructions (Signed)
Patient Instructions  Please continue Metformin ER 500 mg in am and 1000 mg with dinner. Please decrease Glipizide XL to 2.5 mg in am.  Please return in 4 months with your sugar log.

## 2015-10-25 ENCOUNTER — Ambulatory Visit (INDEPENDENT_AMBULATORY_CARE_PROVIDER_SITE_OTHER): Payer: Commercial Managed Care - HMO | Admitting: Family Medicine

## 2015-10-25 ENCOUNTER — Encounter: Payer: Self-pay | Admitting: Family Medicine

## 2015-10-25 VITALS — BP 140/80 | HR 88 | Temp 98.2°F | Ht 61.0 in | Wt 182.2 lb

## 2015-10-25 DIAGNOSIS — E785 Hyperlipidemia, unspecified: Secondary | ICD-10-CM

## 2015-10-25 DIAGNOSIS — R0789 Other chest pain: Secondary | ICD-10-CM | POA: Diagnosis not present

## 2015-10-25 DIAGNOSIS — R079 Chest pain, unspecified: Secondary | ICD-10-CM | POA: Insufficient documentation

## 2015-10-25 DIAGNOSIS — J069 Acute upper respiratory infection, unspecified: Secondary | ICD-10-CM | POA: Diagnosis not present

## 2015-10-25 DIAGNOSIS — I10 Essential (primary) hypertension: Secondary | ICD-10-CM

## 2015-10-25 NOTE — Assessment & Plan Note (Signed)
With elevated triglycerides Intol of statins Disc goals for lipids and reasons to control them Rev labs with pt Rev low sat fat diet in detail

## 2015-10-25 NOTE — Patient Instructions (Signed)
bp is not low today  I am concerned with your fatigue and chest symptoms and shortness of breath  Stop at check out for referral to cardiology  If symptoms worsen-go to the ER if necessary   I think you are also getting a cold also Get some rest and drink fluids

## 2015-10-25 NOTE — Progress Notes (Signed)
Pre visit review using our clinic review tool, if applicable. No additional management support is needed unless otherwise documented below in the visit note. 

## 2015-10-25 NOTE — Assessment & Plan Note (Signed)
bp has been low outside the office on several occ - is elevated today  Ref to cardiology for chest discomfort/exercise intolerance in this setting

## 2015-10-25 NOTE — Assessment & Plan Note (Addendum)
In 75 yo female former smoker/diabetic/hyperlipidemic (intol of statins)/HTN (with ? Of low bp lately) Intermittent -sometimes exertional/ assoc with sob and clammy feeling  Reassuring EKG -and no symptoms today  Will ref to cardiol for eval  inst to seek care in ED if symptoms return or worsen

## 2015-10-25 NOTE — Progress Notes (Signed)
Subjective:    Patient ID: Erin Beck, female    DOB: 05/08/1941, 75 y.o.   MRN: [id number]  HPI Here for blood pressure issues and other symptoms and cold symptoms  Was low at Dr Cruzita Lederer last week   Dizzy when changing positions  Also no energy   BP Readings from Last 3 Encounters:  10/25/15 138/58  10/18/15 100/58  06/17/15 118/62      Worried about bp being low  At times - she feels pain in her chest R side (sometimes sharp pain /sometimes a quivery feeling)-not a pressure   (since the fall) It "hurts in there" whenever she lies down And sometimes when she sits up  Also when going up the stairs  (can happen any time)  Lasts around 5 minutes or a bit more  Assoc with sob and some clammy/sweaty feeling   EKG today NSR rate of 87 and no acute changes    No hx of heart dz  Has hyperlipidemia that she cannot control (intol of meds) Cannot exercise due to knee pain  "tries to" eat a healthy diet  Stays away from fatty foods  Diabetes has been better - decreased glipizide recently     Chemistry      Component Value Date/Time   NA 139 06/10/2015 0828   K 4.0 06/10/2015 0828   CL 99 06/10/2015 0828   CO2 31 06/10/2015 0828   BUN 12 06/10/2015 0828   CREATININE 0.84 06/10/2015 0828      Component Value Date/Time   CALCIUM 9.6 06/10/2015 0828   ALKPHOS 59 06/10/2015 0828   AST 25 06/10/2015 0828   ALT 48* 06/10/2015 0828   BILITOT 0.5 06/10/2015 0828     Lab Results  Component Value Date   HGBA1C 6.4 06/10/2015   Lab Results  Component Value Date   CHOL 223* 06/10/2015   HDL 36.50* 06/10/2015   LDLCALC 127* 03/09/2014   LDLDIRECT 154.0 06/10/2015   TRIG 268.0* 06/10/2015   CHOLHDL 6 06/10/2015      She is also congested today - very runny nose and sneezing  All clear d/c No fever No st but has pnd  Pressure behind R eye  No cough  Patient Active Problem List   Diagnosis Date Noted  . Chest pain 10/25/2015  . Type 2 diabetes mellitus  with hyperglycemia, without long-term current use of insulin ([place]) 06/17/2015  . Routine general medical examination at a health care facility 06/14/2015  . Estrogen deficiency 06/14/2015  . Hearing loss of aging 06/14/2015  . Eustachian tube dysfunction 06/03/2015  . Bladder spasm 06/03/2015  . Eyelid twitch 12/07/2014  . 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 Local reaction to immunization 06/16/2014  . Chronic neck and back pain 06/15/2014  . Acute ear pain 06/10/2014  . Cephalalgia 06/10/2014  . Right shoulder pain 03/12/2014  . Vitreomacular adhesion 05/05/2013  . Unspecified asthma(493.90) 04/23/2013  . Encounter for Medicare annual wellness exam 03/03/2013  . Personal history of colonic polyps 03/03/2013  . Post-menopausal 10/17/2012  . Obesity 10/31/2011  . Other screening mammogram 10/31/2011  . IBS (irritable bowel syndrome) 08/10/2011  . GERD 07/31/2010  . Allergic rhinitis 12/05/2007  . HYPERTENSION, BENIGN ESSENTIAL 02/05/2007  . TOBACCO USE, QUIT 02/05/2007  . Hyperlipidemia 01/31/2007  . ANXIETY 01/31/2007  . OSTEOARTHRITIS 01/31/2007  . DIVERTICULITIS, HX OF 01/31/2007   Past Medical History  Diagnosis Date  . Anxiety   . Diabetes mellitus     type  II  . Diverticulitis     Hx of  . Hyperlipidemia   . Arthritis     OA  . Osteopenia   . GERD (gastroesophageal reflux disease)   . Hypertension   . Allergic rhinitis   . IBS (irritable bowel syndrome)   . Hematuria     HX OF MICROSCOPIC BLOOD IN URINE AND HX OF CYST ON ONE KIDNEY--BUT NO KIDNEY DISEASE  . Asthma   . Full dentures   . Wears glasses   . Complication of anesthesia     gas bubble rt eye from an old retinal detachment   Past Surgical History  Procedure Laterality Date  . Abdominal hysterectomy    . Cholecystectomy    . Knee arthroscopy  1990    chondromalacia   . Total knee arthroplasty  12/05/2011    Procedure: TOTAL KNEE ARTHROPLASTY;  Surgeon: Magnus Sinning, MD;  Location: [facility];   Service: Orthopedics;  Laterality: Right;  . Appendectomy      per pt  . Joint replacement  2000    LEFT TOTAL KNEE ARTHROPLASTY  . Joint replacement  2013    rt total knee  . Colonoscopy    . Carpal tunnel release      both hands  . Eye surgery      BILATERAL CATARACTS REMOVED  . Eye surgery      gas bubble in rt eye from a retinal detachment  . Ulnar nerve transposition Right 09/15/2014    Procedure: Right cubital tunnel release with transposition of ulnar nerve;  Surgeon: Marianna Payment, MD;  Location: [facility];  Service: Orthopedics;  Laterality: Right;   Social History  Substance Use Topics  . Smoking status: Former Smoker -- 1.00 packs/day for 48 years    Quit date: 08/20/2009  . Smokeless tobacco: Never Used  . Alcohol Use: No   Family History  Problem Relation Age of Onset  . Diabetes Father   . Diabetes Brother    Allergies  Allergen Reactions  . Naproxen Rash    Trouble breathing  . Naproxen Sodium Rash    Trouble breathing  . Aspirin     REACTION: burns stomach PT STATES SHE DOES TOLERATE THE 81 MG ASPIRIN DAILY  . Atorvastatin     REACTION: muscle pain  . Buspar [Buspirone Hcl]     Multiple side eff  . Gabapentin Other (See Comments)    Palpitations/ dizziness   . Metformin And Related Diarrhea  . Norco [Hydrocodone-Acetaminophen] Other (See Comments)    Dizzy and problems remembering   . Simvastatin     REACTION: GI side eff and swelling  . Sulfa Antibiotics Other (See Comments)    Severe joint pain  . Sulfonamide Derivatives     REACTION: reaction not known   Current Outpatient Prescriptions on File Prior to Visit  Medication Sig Dispense Refill  . ACCU-CHEK FASTCLIX LANCETS MISC Use to test blood sugar 4vtimes daily. Dx: E11.65 408 each 3  . acetaminophen (TYLENOL) 325 MG tablet Take 2 tablets (650 mg total) by mouth every 6 (six) hours as needed.    Marland Kitchen aspirin 81 MG tablet Take 81 mg by mouth daily.    . Blood Glucose  Monitoring Suppl (ACCU-CHEK NANO SMARTVIEW) W/DEVICE KIT Use to test blood sugar daily. 1 kit 0  . glipiZIDE (GLUCOTROL XL) 2.5 MG 24 hr tablet Take 1 tablet (2.5 mg total) by mouth daily before breakfast. 90 tablet 3  . glucose blood (  ACCU-CHEK SMARTVIEW) test strip Use to test blood sugar 2 times daily as instructed. Dx:E11.65 200 each 3  . glucose blood test strip 1 each 2 (two) times daily. Check blood sugar twice daily and as needed.    . hydrochlorothiazide (HYDRODIURIL) 25 MG tablet TAKE 1 TABLET EVERY DAY 90 tablet 3  . lisinopril (PRINIVIL,ZESTRIL) 10 MG tablet TAKE 1 TABLET EVERY DAY 90 tablet 3  . metFORMIN (GLUCOPHAGE-XR) 500 MG 24 hr tablet TAKE 2 TABLETS EVERY EVENING (Patient taking differently: TAKE 1 TABLET IN THE MORNING AND 2 TABLETS EVERY EVENING) 180 tablet 0  . omeprazole (PRILOSEC) 20 MG capsule TAKE 1 CAPSULE EVERY DAY 90 capsule 2   No current facility-administered medications on file prior to visit.    Review of Systems Review of Systems  Constitutional: Negative for fever, appetite change,  and unexpected weight change. pos for fatigue and exercise intolerance lately Eyes: Negative for pain and visual disturbance.  Respiratory: Negative for cough and pos for occ shortness of breath.   Cardiovascular: Negative for palpitations neg for PND/orthopnea or pedal edema (neg for leg pain)   Gastrointestinal: Negative for nausea, diarrhea and constipation.  Genitourinary: Negative for urgency and frequency.  Skin: Negative for pallor or rash   Neurological: Negative for weakness, numbness and headaches. pos for dizziness with pos change when bp is low  Hematological: Negative for adenopathy. Does not bruise/bleed easily.  Psychiatric/Behavioral: Negative for dysphoric mood. The patient is occ nervous/anxious.         Objective:   Physical Exam  Constitutional: She appears well-developed and well-nourished. No distress.  obese and well appearing   HENT:  Head:  Normocephalic and atraumatic.  Right Ear: External ear normal.  Left Ear: External ear normal.  Mouth/Throat: Oropharynx is clear and moist.  Boggy nares with clear rhinorrhea No sinus tenderness TMs are dull   Throat -clear with pnd   Eyes: Conjunctivae and EOM are normal. Pupils are equal, round, and reactive to light. Right eye exhibits no discharge. Left eye exhibits no discharge. No scleral icterus.  Neck: Normal range of motion. Neck supple. No JVD present. Carotid bruit is not present. No thyromegaly present.  Cardiovascular: Normal rate, regular rhythm, normal heart sounds and intact distal pulses.  Exam reveals no gallop.   Pulmonary/Chest: Effort normal and breath sounds normal. No respiratory distress. She has no wheezes. She has no rales. She exhibits no tenderness.  No crackles  Abdominal: Soft. Bowel sounds are normal. She exhibits no distension, no abdominal bruit and no mass. There is no tenderness.  Musculoskeletal: She exhibits no edema.  Lymphadenopathy:    She has no cervical adenopathy.  Neurological: She is alert. She has normal reflexes. No cranial nerve deficit. She exhibits normal muscle tone. Coordination normal.  Skin: Skin is warm and dry. No rash noted.  Psychiatric: She has a normal mood and affect.  Pleasant and talkative           Assessment & Plan:   Problem List Items Addressed This Visit      Cardiovascular and Mediastinum   HYPERTENSION, BENIGN ESSENTIAL    bp has been low outside the office on several occ - is elevated today  Ref to cardiology for chest discomfort/exercise intolerance in this setting          Respiratory   Viral URI    Reassuring exam Some allergic rhinitis as well Disc symptomatic care - see instructions on AVS  Disc allergen avoidance  Update if not  starting to improve in a week or if worsening  -esp if cough or sob         Other   Chest pain - Primary    In 75 yo female former smoker/diabetic/hyperlipidemic  (intol of statins)/HTN (with ? Of low bp lately) Intermittent -sometimes exertional/ assoc with sob and clammy feeling  Reassuring EKG -and no symptoms today  Will ref to cardiol for eval  inst to seek care in ED if symptoms return or worsen       Relevant Orders   EKG 12-Lead (Completed)   Ambulatory referral to Cardiology   Hyperlipidemia    With elevated triglycerides Intol of statins Disc goals for lipids and reasons to control them Rev labs with pt Rev low sat fat diet in detail

## 2015-10-25 NOTE — Assessment & Plan Note (Signed)
Reassuring exam Some allergic rhinitis as well Disc symptomatic care - see instructions on AVS  Disc allergen avoidance  Update if not starting to improve in a week or if worsening  -esp if cough or sob

## 2015-11-25 ENCOUNTER — Other Ambulatory Visit: Payer: Self-pay | Admitting: Internal Medicine

## 2015-11-28 ENCOUNTER — Other Ambulatory Visit: Payer: Self-pay | Admitting: Internal Medicine

## 2015-12-06 ENCOUNTER — Ambulatory Visit (INDEPENDENT_AMBULATORY_CARE_PROVIDER_SITE_OTHER): Payer: Commercial Managed Care - HMO | Admitting: Cardiology

## 2015-12-06 ENCOUNTER — Encounter: Payer: Self-pay | Admitting: Cardiology

## 2015-12-06 VITALS — BP 134/76 | HR 66 | Ht 61.0 in | Wt 179.4 lb

## 2015-12-06 DIAGNOSIS — E785 Hyperlipidemia, unspecified: Secondary | ICD-10-CM

## 2015-12-06 DIAGNOSIS — R079 Chest pain, unspecified: Secondary | ICD-10-CM | POA: Diagnosis not present

## 2015-12-06 DIAGNOSIS — R42 Dizziness and giddiness: Secondary | ICD-10-CM

## 2015-12-06 DIAGNOSIS — I10 Essential (primary) hypertension: Secondary | ICD-10-CM | POA: Diagnosis not present

## 2015-12-06 NOTE — Progress Notes (Signed)
Cardiology Office Note    Date:  12/06/2015   ID:  Erin Beck, DOB Oct 16, 1940, MRN [id number]  PCP:  [name], MD  Cardiologist:  Sueanne Margarita, MD   Chief Complaint  Patient presents with  . Chest Pain  . Hypertension    History of Present Illness:  Erin Beck is a 75 y.o. female who presents today for evaluation of right sided chest pain.  The pain is described as sharp but sometimes like a quivering sensation.  This has been occurring for about 6 months.  It is worse when she lays down and when going up the stairs and lasts around 5 minutes.  The CP is very sporadic and is more frequent when she is stressed out or very tired.  She has had some problems with dizziness recently to the point she felt she was going to pass out and saw her PCP and her BP was low.  She says that her heart races when she exerts herself.  She will get SOB as well and break out in a sweat with the discomfort.  EKG at PCP office showed no ischemia.  She has a history of DM, dyslipidemia, GERD, HTN and remote tobacco use.     Past Medical History  Diagnosis Date  . Anxiety   . Diabetes mellitus     type II  . Diverticulitis     Hx of  . Hyperlipidemia   . Arthritis     OA  . Osteopenia   . GERD (gastroesophageal reflux disease)   . Hypertension   . Allergic rhinitis   . IBS (irritable bowel syndrome)   . Hematuria     HX OF MICROSCOPIC BLOOD IN URINE AND HX OF CYST ON ONE KIDNEY--BUT NO KIDNEY DISEASE  . Asthma   . Full dentures   . Wears glasses   . Complication of anesthesia     gas bubble rt eye from an old retinal detachment    Past Surgical History  Procedure Laterality Date  . Abdominal hysterectomy    . Cholecystectomy    . Knee arthroscopy  1990    chondromalacia   . Total knee arthroplasty  12/05/2011    Procedure: TOTAL KNEE ARTHROPLASTY;  Surgeon: Magnus Sinning, MD;  Location: [facility];  Service: Orthopedics;  Laterality: Right;  . Appendectomy      per pt    . Joint replacement  2000    LEFT TOTAL KNEE ARTHROPLASTY  . Joint replacement  2013    rt total knee  . Colonoscopy    . Carpal tunnel release      both hands  . Eye surgery      BILATERAL CATARACTS REMOVED  . Eye surgery      gas bubble in rt eye from a retinal detachment  . Ulnar nerve transposition Right 09/15/2014    Procedure: Right cubital tunnel release with transposition of ulnar nerve;  Surgeon: Marianna Payment, MD;  Location: [facility];  Service: Orthopedics;  Laterality: Right;    Current Medications: Outpatient Prescriptions Prior to Visit  Medication Sig Dispense Refill  . acetaminophen (TYLENOL) 325 MG tablet Take 2 tablets (650 mg total) by mouth every 6 (six) hours as needed.    Marland Kitchen albuterol (VENTOLIN HFA) 108 (90 Base) MCG/ACT inhaler Inhale 2 puffs into the lungs every 6 (six) hours as needed for wheezing or shortness of breath.    Marland Kitchen aspirin 81 MG tablet Take 81 mg by  mouth daily.    Marland Kitchen glipiZIDE (GLUCOTROL XL) 2.5 MG 24 hr tablet Take 1 tablet (2.5 mg total) by mouth daily before breakfast. 90 tablet 3  . glucose blood test strip 1 each 2 (two) times daily. Check blood sugar twice daily and as needed.    . hydrochlorothiazide (HYDRODIURIL) 25 MG tablet TAKE 1 TABLET EVERY DAY 90 tablet 3  . lisinopril (PRINIVIL,ZESTRIL) 10 MG tablet TAKE 1 TABLET EVERY DAY 90 tablet 3  . metFORMIN (GLUCOPHAGE-XR) 500 MG 24 hr tablet TAKE 2 TABLETS EVERY EVENING 180 tablet 0  . omeprazole (PRILOSEC) 20 MG capsule TAKE 1 CAPSULE EVERY DAY 90 capsule 2  . ACCU-CHEK FASTCLIX LANCETS MISC USE TO TEST BLOOD SUGAR FOUR TIMES DAILY 408 each 3  . Blood Glucose Monitoring Suppl (ACCU-CHEK NANO SMARTVIEW) W/DEVICE KIT Use to test blood sugar daily. 1 kit 0  . glucose blood (ACCU-CHEK SMARTVIEW) test strip Use to test blood sugar 2 times daily as instructed. Dx:E11.65 200 each 3   No facility-administered medications prior to visit.     Allergies:   Naproxen; Naproxen  sodium; Aspirin; Atorvastatin; Buspar; Gabapentin; Metformin and related; Norco; Simvastatin; Sulfa antibiotics; and Sulfonamide derivatives   Social History   Social History  . Marital Status: Married    Spouse Name: N/A  . Number of Children: N/A  . Years of Education: N/A   Social History Main Topics  . Smoking status: Former Smoker -- 1.00 packs/day for 48 years    Quit date: 08/20/2009  . Smokeless tobacco: Never Used  . Alcohol Use: No  . Drug Use: No  . Sexual Activity: Not Asked   Other Topics Concern  . None   Social History Narrative     Family History:  The patient's family history includes Diabetes in her brother and father.   ROS:   Please see the history of present illness.    Review of Systems  Constitution: Negative.  HENT: Negative.   Eyes: Negative.   Cardiovascular: Negative.   Respiratory: Positive for wheezing.   Skin: Negative.   Musculoskeletal: Positive for back pain.  Gastrointestinal: Negative.   Genitourinary: Negative.   Neurological: Positive for dizziness and loss of balance.  Psychiatric/Behavioral: Negative.    All other systems reviewed and are negative.   PHYSICAL EXAM:   VS:  BP 134/76 mmHg  Pulse 66  Ht '5\' 1"'$  (1.549 m)  Wt 179 lb 6.4 oz (81.375 kg)  BMI 33.91 kg/m2   GEN: Well nourished, well developed, in no acute distress HEENT: normal Neck: no JVD, carotid bruits, or masses Cardiac: RRR; no murmurs, rubs, or gallops,no edema.  Intact distal pulses bilaterally.  Respiratory:  clear to auscultation bilaterally, normal work of breathing GI: soft, nontender, nondistended, + BS MS: no deformity or atrophy Skin: warm and dry, no rash Neuro:  Alert and Oriented x 3, Strength and sensation are intact Psych: euthymic mood, full affect  Wt Readings from Last 3 Encounters:  12/06/15 179 lb 6.4 oz (81.375 kg)  10/25/15 182 lb 4 oz (82.668 kg)  10/18/15 186 lb (84.369 kg)      Studies/Labs Reviewed:   EKG:  EKG is not  ordered today.    Recent Labs: 06/10/2015: ALT 48*; BUN 12; Creatinine, Ser 0.84; Hemoglobin 14.5; Platelets 227.0; Potassium 4.0; Sodium 139; TSH 2.67   Lipid Panel    Component Value Date/Time   CHOL 223* 06/10/2015 0828   TRIG 268.0* 06/10/2015 0828   HDL 36.50* 06/10/2015 8127  CHOLHDL 6 06/10/2015 0828   VLDL 53.6* 06/10/2015 0828   LDLCALC 127* 03/09/2014 0838   LDLDIRECT 154.0 06/10/2015 6815    Additional studies/ records that were reviewed today include:  OV notes from PCP    ASSESSMENT:    1. Chest pain, unspecified chest pain type   2. HYPERTENSION, BENIGN ESSENTIAL   3. Hyperlipidemia      PLAN:  In order of problems listed above:  1. Chest pain with typical and atypical components.  She has CRF including HTN, DM, Dyslipidemia and remote tobacco use. Her EKG is nonischemic.  I have recommended that we proceed with Lexiscan myoview to rule out ischemia.  Check a 2D echo to assess LVF.   2. HTN - BP is well controlled.  Continue ACE I and diuretic 3. Hyperlipidemia - she is not on a statin at this time.  She is stain intolerant 4. Dizziness with palpitations.  ? If this is related to low BP. I will get a 30 day heart monitor to rule out arrhythmia.  I have asked her to check her BP immediately if she has another dizzy spell and let me know what it is.    Medication Adjustments/Labs and Tests Ordered: Current medicines are reviewed at length with the patient today.  Concerns regarding medicines are outlined above.  Medication changes, Labs and Tests ordered today are listed in the Patient Instructions below. There are no Patient Instructions on file for this visit.   Lurena Nida, MD  12/06/2015 11:44 AM    [facility] [place], [place], [place]  [address] Phone: [phone]; Fax: [phone]

## 2015-12-06 NOTE — Patient Instructions (Signed)
Medication Instructions:  Your physician recommends that you continue on your current medications as directed. Please refer to the Current Medication list given to you today.   Labwork: None  Testing/Procedures: Your physician has requested that you have an echocardiogram. Echocardiography is a painless test that uses sound waves to create images of your heart. It provides your doctor with information about the size and shape of your heart and how well your heart's chambers and valves are working. This procedure takes approximately one hour. There are no restrictions for this procedure.   Your physician has requested that you have a lexiscan myoview. For further information please visit [email or web address]. Please follow instruction sheet, as given.  Your physician has recommended that you wear an event monitor. Event monitors are medical devices that record the heart's electrical activity. Doctors most often [place] these monitors to diagnose arrhythmias. Arrhythmias are problems with the speed or rhythm of the heartbeat. The monitor is a small, portable device. You can wear one while you do your normal daily activities. This is usually used to diagnose what is causing palpitations/syncope (passing out).  Follow-Up: Your physician recommends that you schedule a follow-up appointment AS NEEDED with Dr. Radford Pax pending study results.  Any Other Special Instructions Will Be Listed Below (If Applicable).     If you need a refill on your cardiac medications before your next appointment, please call your pharmacy.

## 2015-12-19 ENCOUNTER — Telehealth (HOSPITAL_COMMUNITY): Payer: Self-pay | Admitting: *Deleted

## 2015-12-19 NOTE — Telephone Encounter (Signed)
Patient given detailed instructions per Myocardial Perfusion Study Information Sheet for the test on 12/21/15. Patient notified to arrive 15 minutes early and that it is imperative to arrive on time for appointment to keep from having the test rescheduled.  If you need to cancel or reschedule your appointment, please call the office within 24 hours of your appointment. Failure to do so may result in a cancellation of your appointment, and a $50 no show fee. Patient verbalized understanding.Quavis Klutz J Jeannifer Drakeford, RN  

## 2015-12-21 ENCOUNTER — Ambulatory Visit (HOSPITAL_BASED_OUTPATIENT_CLINIC_OR_DEPARTMENT_OTHER): Payer: Commercial Managed Care - HMO

## 2015-12-21 ENCOUNTER — Other Ambulatory Visit: Payer: Self-pay

## 2015-12-21 ENCOUNTER — Ambulatory Visit (INDEPENDENT_AMBULATORY_CARE_PROVIDER_SITE_OTHER): Payer: Commercial Managed Care - HMO

## 2015-12-21 ENCOUNTER — Ambulatory Visit (HOSPITAL_COMMUNITY): Payer: Commercial Managed Care - HMO | Attending: Internal Medicine

## 2015-12-21 ENCOUNTER — Encounter (HOSPITAL_COMMUNITY): Payer: Self-pay

## 2015-12-21 DIAGNOSIS — R9439 Abnormal result of other cardiovascular function study: Secondary | ICD-10-CM | POA: Diagnosis not present

## 2015-12-21 DIAGNOSIS — R002 Palpitations: Secondary | ICD-10-CM | POA: Diagnosis not present

## 2015-12-21 DIAGNOSIS — I119 Hypertensive heart disease without heart failure: Secondary | ICD-10-CM | POA: Insufficient documentation

## 2015-12-21 DIAGNOSIS — E119 Type 2 diabetes mellitus without complications: Secondary | ICD-10-CM | POA: Insufficient documentation

## 2015-12-21 DIAGNOSIS — E785 Hyperlipidemia, unspecified: Secondary | ICD-10-CM | POA: Insufficient documentation

## 2015-12-21 DIAGNOSIS — R079 Chest pain, unspecified: Secondary | ICD-10-CM

## 2015-12-21 DIAGNOSIS — R42 Dizziness and giddiness: Secondary | ICD-10-CM

## 2015-12-21 LAB — MYOCARDIAL PERFUSION IMAGING
CHL CUP NUCLEAR SDS: 9
CHL CUP RESTING HR STRESS: 74 {beats}/min
CSEPPHR: 102 {beats}/min
LHR: 0.24
LV dias vol: 72 mL (ref 46–106)
LVSYSVOL: 27 mL
SRS: 4
SSS: 13
TID: 1.16

## 2015-12-21 MED ORDER — TECHNETIUM TC 99M SESTAMIBI GENERIC - CARDIOLITE
32.8000 | Freq: Once | INTRAVENOUS | Status: AC | PRN
Start: 2015-12-21 — End: 2015-12-21
  Administered 2015-12-21: 32.8 via INTRAVENOUS

## 2015-12-21 MED ORDER — REGADENOSON 0.4 MG/5ML IV SOLN
0.4000 mg | Freq: Once | INTRAVENOUS | Status: AC
  Administered 2015-12-21: 0.4 mg via INTRAVENOUS

## 2015-12-21 MED ORDER — TECHNETIUM TC 99M SESTAMIBI GENERIC - CARDIOLITE
10.4000 | Freq: Once | INTRAVENOUS | Status: AC | PRN
  Administered 2015-12-21: 10 via INTRAVENOUS

## 2015-12-22 ENCOUNTER — Encounter: Payer: Self-pay | Admitting: *Deleted

## 2015-12-26 NOTE — Progress Notes (Signed)
Cardiology Office Note:    Date:  12/27/2015   ID:  Erin Beck, DOB 07-Mar-1941, MRN [id number]  PCP:  [name], MD  Cardiologist:  Dr. [name]   Electrophysiologist:  n/a  Referring MD: Abner Greenspan, MD   Chief Complaint  Patient presents with  . Chest Pain    Follow up; Abnormal Myoview    History of Present Illness:     Erin Beck is a 75 y.o. female with a hx of HTN, HL, diabetes, GERD. She was recently evaluated by Dr. Radford Pax for chest pain, dyspnea and dizziness. Lexiscan Myoview, echocardiogram and event monitor were recommended. Echocardiogram demonstrated mild LVH, normal LV function and mild diastolic dysfunction. Myoview did demonstrate moderate inferior wall ischemia with an EF of 62%. This is felt to be intermediate risk. Dr. Radford Pax asked the patient return today to discuss proceeding with cardiac catheterization to further evaluate.   Here today with her husband. She continues to have episodes of chest discomfort. This seems to come on with emotional stress. She also notes dyspnea with exertion. She denies syncope. Denies orthopnea, PND or edema.   Past Medical History  Diagnosis Date  . Anxiety   . Diabetes mellitus     type II  . Diverticulitis     Hx of  . Hyperlipidemia   . Arthritis     OA  . Osteopenia   . GERD (gastroesophageal reflux disease)   . Hypertension   . Allergic rhinitis   . IBS (irritable bowel syndrome)   . Hematuria     HX OF MICROSCOPIC BLOOD IN URINE AND HX OF CYST ON ONE KIDNEY--BUT NO KIDNEY DISEASE  . Asthma   . Full dentures   . Wears glasses   . Complication of anesthesia     gas bubble rt eye from an old retinal detachment    Past Surgical History  Procedure Laterality Date  . Abdominal hysterectomy    . Cholecystectomy    . Knee arthroscopy  1990    chondromalacia   . Total knee arthroplasty  12/05/2011    Procedure: TOTAL KNEE ARTHROPLASTY;  Surgeon: Magnus Sinning, MD;  Location: [facility];   Service: Orthopedics;  Laterality: Right;  . Appendectomy      per pt  . Joint replacement  2000    LEFT TOTAL KNEE ARTHROPLASTY  . Joint replacement  2013    rt total knee  . Colonoscopy    . Carpal tunnel release      both hands  . Eye surgery      BILATERAL CATARACTS REMOVED  . Eye surgery      gas bubble in rt eye from a retinal detachment  . Ulnar nerve transposition Right 09/15/2014    Procedure: Right cubital tunnel release with transposition of ulnar nerve;  Surgeon: Marianna Payment, MD;  Location: [facility];  Service: Orthopedics;  Laterality: Right;    Current Medications: Outpatient Prescriptions Prior to Visit  Medication Sig Dispense Refill  . acetaminophen (TYLENOL) 325 MG tablet Take 2 tablets (650 mg total) by mouth every 6 (six) hours as needed.    Marland Kitchen albuterol (VENTOLIN HFA) 108 (90 Base) MCG/ACT inhaler Inhale 2 puffs into the lungs every 6 (six) hours as needed for wheezing or shortness of breath.    Marland Kitchen aspirin 81 MG tablet Take 81 mg by mouth daily.    Marland Kitchen glipiZIDE (GLUCOTROL XL) 2.5 MG 24 hr tablet Take 1 tablet (2.5 mg total)  by mouth daily before breakfast. 90 tablet 3  . glucose blood test strip 1 each 2 (two) times daily. Check blood sugar twice daily and as needed.    . hydrochlorothiazide (HYDRODIURIL) 25 MG tablet TAKE 1 TABLET EVERY DAY 90 tablet 3  . lisinopril (PRINIVIL,ZESTRIL) 10 MG tablet TAKE 1 TABLET EVERY DAY 90 tablet 3  . metFORMIN (GLUCOPHAGE-XR) 500 MG 24 hr tablet TAKE 2 TABLETS EVERY EVENING 180 tablet 0  . omeprazole (PRILOSEC) 20 MG capsule TAKE 1 CAPSULE EVERY DAY 90 capsule 2   No facility-administered medications prior to visit.      Allergies:   Naproxen; Naproxen sodium; Aspirin; Atorvastatin; Buspar; Gabapentin; Metformin and related; Norco; Simvastatin; Sulfa antibiotics; and Sulfonamide derivatives   Social History   Social History  . Marital Status: Married    Spouse Name: N/A  . Number of Children: N/A  .  Years of Education: N/A   Social History Main Topics  . Smoking status: Former Smoker -- 1.00 packs/day for 48 years    Quit date: 08/20/2009  . Smokeless tobacco: Never Used  . Alcohol Use: No  . Drug Use: No  . Sexual Activity: Not Asked   Other Topics Concern  . None   Social History Narrative     Family History:  The patient's family history includes Cancer in her father; Diabetes in her brother and father; Heart disease in her maternal grandmother and paternal grandmother; Pneumonia in her mother.   ROS:   Please see the history of present illness.    ROS All other systems reviewed and are negative.   Physical Exam:    VS:  BP 160/60 mmHg  Pulse 98  Ht 5\' 1"  (1.549 m)  Wt 180 lb 9.6 oz (81.92 kg)  BMI 34.14 kg/m2  SpO2 96%   GEN: Well nourished, well developed, in no acute distress HEENT: normal Neck: no JVD, no masses Cardiac: Normal S1/S2, RRR; no murmurs, rubs, or gallops, no edema;     Respiratory:  clear to auscultation bilaterally; no wheezing, rhonchi or rales GI: soft, nontender, nondistended MS: no deformity or atrophy Skin: warm and dry Neuro: No focal deficits  Psych: Alert and oriented x 3, normal affect  Wt Readings from Last 3 Encounters:  12/27/15 180 lb 9.6 oz (81.92 kg)  12/21/15 179 lb (81.194 kg)  12/06/15 179 lb 6.4 oz (81.375 kg)      Studies/Labs Reviewed:     EKG:  EKG is not ordered today.  The ekg ordered today demonstrates n/a  Recent Labs: 06/10/2015: ALT 48*; BUN 12; Creatinine, Ser 0.84; Hemoglobin 14.5; Platelets 227.0; Potassium 4.0; Sodium 139; TSH 2.67   Recent Lipid Panel    Component Value Date/Time   CHOL 223* 06/10/2015 0828   TRIG 268.0* 06/10/2015 0828   HDL 36.50* 06/10/2015 0828   CHOLHDL 6 06/10/2015 0828   VLDL 53.6* 06/10/2015 0828   LDLCALC 127* 03/09/2014 0838   LDLDIRECT 154.0 06/10/2015 0828    Additional studies/ records that were reviewed today include:   Myoview 12/21/15 Moderate inferior wall  ischemia at mid and basal level SDS 9 EF 62% Intermediate Risk  Echo 12/21/15 Mild LVH, EF 60-65%, no RWMA, Gr 1 DD, low normal RVSF   ASSESSMENT:     1. Precordial pain   2. Dizziness   3. HYPERTENSION, BENIGN ESSENTIAL   4. Hyperlipidemia   5. Controlled type 2 diabetes mellitus without complication, without long-term current use of insulin ([place])     PLAN:  In order of problems listed above:  1. Chest pain - She has had episodes of chest pain, dyspnea and dizziness. Recent Myoview is intermediate risk with inferior wall ischemia. EF is normal. Dr. Radford Pax has recommended proceeding with cardiac catheterization.  Risks and benefits of cardiac catheterization have been discussed with the patient.  These include bleeding, infection, kidney damage, stroke, heart attack, death.  The patient understands these risks and is willing to proceed.   -  Arrange LHC   -  Rx for prn NTG given   -  Go to ED if symptoms worse.  2. Dizziness - No hx of syncope.  Event monitor pending.  Echo with normal LVEF.  3. HTN - BP elevated today.  She notes optimal BP at home.  Will continue to monitor and adjust Rx if needed.  4. HL - Statin intol.  Will need to try statin Rx again if + CAD on cath.   5. DM - Hold Glipizide day of cath and Metformin 24 hours pre and 48 hours post cath.   Medication Adjustments/Labs and Tests Ordered: Current medicines are reviewed at length with the patient today.  Concerns regarding medicines are outlined above.  Medication changes, Labs and Tests ordered today are outlined in the Patient Instructions noted below. Patient Instructions  Medication Instructions:  YOU HAVE BEEN GIVEN AN RX FOR NITROGLYCERIN AND HAVE BEEN ADVISED TO HOW AND WHEN USE NTG Labwork: TODAY BMET, CBC W/DIFF, PT/INR Testing/Procedures: Your physician has requested that you have a cardiac catheterization. Cardiac catheterization is used to diagnose and/or treat various heart conditions.  Doctors may recommend this procedure for a number of different reasons. The most common reason is to evaluate chest pain. Chest pain can be a symptom of coronary artery disease (CAD), and cardiac catheterization can show whether plaque is narrowing or blocking your heart's arteries. This procedure is also used to evaluate the valves, as well as measure the blood flow and oxygen levels in different parts of your heart. For further information please visit [email or web address]. Please follow instruction sheet, as given. Follow-Up: 01/12/16 @ 10:10 WITH Kaliah Haddaway, PAC  Any Other Special Instructions Will Be Listed Below (If Applicable). If you need a refill on your cardiac medications before your next appointment, please call your pharmacy.    Signed, Richardson Dopp, PA-C  12/27/2015 12:51 PM    [facility] [address], [place], [place]  [address] Phone: [phone]; Fax: [phone]

## 2015-12-27 ENCOUNTER — Encounter: Payer: Self-pay | Admitting: Physician Assistant

## 2015-12-27 ENCOUNTER — Encounter: Payer: Self-pay | Admitting: *Deleted

## 2015-12-27 ENCOUNTER — Ambulatory Visit (INDEPENDENT_AMBULATORY_CARE_PROVIDER_SITE_OTHER): Payer: Commercial Managed Care - HMO | Admitting: Physician Assistant

## 2015-12-27 VITALS — BP 160/60 | HR 98 | Ht 61.0 in | Wt 180.6 lb

## 2015-12-27 DIAGNOSIS — E119 Type 2 diabetes mellitus without complications: Secondary | ICD-10-CM

## 2015-12-27 DIAGNOSIS — R42 Dizziness and giddiness: Secondary | ICD-10-CM

## 2015-12-27 DIAGNOSIS — I10 Essential (primary) hypertension: Secondary | ICD-10-CM

## 2015-12-27 DIAGNOSIS — R072 Precordial pain: Secondary | ICD-10-CM | POA: Diagnosis not present

## 2015-12-27 DIAGNOSIS — E785 Hyperlipidemia, unspecified: Secondary | ICD-10-CM

## 2015-12-27 LAB — CBC WITH DIFFERENTIAL/PLATELET
BASOS PCT: 0 %
Basophils Absolute: 0 cells/uL (ref 0–200)
EOS PCT: 2 %
Eosinophils Absolute: 156 cells/uL (ref 15–500)
HEMATOCRIT: 42.8 % (ref 35.0–45.0)
HEMOGLOBIN: 14.9 g/dL (ref 11.7–15.5)
LYMPHS ABS: 1248 {cells}/uL (ref 850–3900)
Lymphocytes Relative: 16 %
MCH: 31 pg (ref 27.0–33.0)
MCHC: 34.8 g/dL (ref 32.0–36.0)
MCV: 89.2 fL (ref 80.0–100.0)
MONO ABS: 390 {cells}/uL (ref 200–950)
MPV: 9.2 fL (ref 7.5–12.5)
Monocytes Relative: 5 %
NEUTROS ABS: 6006 {cells}/uL (ref 1500–7800)
Neutrophils Relative %: 77 %
Platelets: 335 10*3/uL (ref 140–400)
RBC: 4.8 MIL/uL (ref 3.80–5.10)
RDW: 13.2 % (ref 11.0–15.0)
WBC: 7.8 10*3/uL (ref 3.8–10.8)

## 2015-12-27 LAB — BASIC METABOLIC PANEL
BUN: 14 mg/dL (ref 7–25)
CALCIUM: 9.4 mg/dL (ref 8.6–10.4)
CO2: 27 mmol/L (ref 20–31)
Chloride: 100 mmol/L (ref 98–110)
Creat: 0.72 mg/dL (ref 0.60–0.93)
GLUCOSE: 175 mg/dL — AB (ref 65–99)
POTASSIUM: 4 mmol/L (ref 3.5–5.3)
SODIUM: 137 mmol/L (ref 135–146)

## 2015-12-27 LAB — PROTIME-INR
INR: 0.9 (ref <1.50)
PROTHROMBIN TIME: 12.2 s (ref 11.6–15.2)

## 2015-12-27 MED ORDER — NITROGLYCERIN 0.4 MG SL SUBL: 0.4000 mg | SUBLINGUAL_TABLET | SUBLINGUAL | Status: DC | PRN

## 2015-12-27 NOTE — Patient Instructions (Addendum)
Medication Instructions:  YOU HAVE BEEN GIVEN AN RX FOR NITROGLYCERIN AND HAVE BEEN ADVISED TO HOW AND WHEN USE NTG Labwork: TODAY BMET, CBC W/DIFF, PT/INR Testing/Procedures: Your physician has requested that you have a cardiac catheterization. Cardiac catheterization is used to diagnose and/or treat various heart conditions. Doctors may recommend this procedure for a number of different reasons. The most common reason is to evaluate chest pain. Chest pain can be a symptom of coronary artery disease (CAD), and cardiac catheterization can show whether plaque is narrowing or blocking your heart's arteries. This procedure is also used to evaluate the valves, as well as measure the blood flow and oxygen levels in different parts of your heart. For further information please visit [email or web address]. Please follow instruction sheet, as given. Follow-Up: 01/12/16 @ 10:10 WITH SCOTT WEAVER, PAC  Any Other Special Instructions Will Be Listed Below (If Applicable). If you need a refill on your cardiac medications before your next appointment, please call your pharmacy.

## 2015-12-29 ENCOUNTER — Encounter (HOSPITAL_COMMUNITY)
Admission: RE | Disposition: A | Discharge status: Home / Self Care | Payer: Self-pay | Source: Ambulatory Visit | Attending: Interventional Cardiology

## 2015-12-29 ENCOUNTER — Ambulatory Visit (HOSPITAL_COMMUNITY)
Admission: RE | Admit: 2015-12-29 | Discharge: 2015-12-29 | Disposition: A | Discharge status: Home / Self Care | Payer: Commercial Managed Care - HMO | Source: Ambulatory Visit | Attending: Interventional Cardiology | Admitting: Interventional Cardiology

## 2015-12-29 ENCOUNTER — Encounter (HOSPITAL_COMMUNITY): Payer: Self-pay | Admitting: Interventional Cardiology

## 2015-12-29 DIAGNOSIS — F419 Anxiety disorder, unspecified: Secondary | ICD-10-CM | POA: Diagnosis not present

## 2015-12-29 DIAGNOSIS — E119 Type 2 diabetes mellitus without complications: Secondary | ICD-10-CM | POA: Insufficient documentation

## 2015-12-29 DIAGNOSIS — Z6833 Body mass index (BMI) 33.0-33.9, adult: Secondary | ICD-10-CM | POA: Diagnosis present

## 2015-12-29 DIAGNOSIS — M8580 Other specified disorders of bone density and structure, unspecified site: Secondary | ICD-10-CM | POA: Diagnosis not present

## 2015-12-29 DIAGNOSIS — K219 Gastro-esophageal reflux disease without esophagitis: Secondary | ICD-10-CM | POA: Insufficient documentation

## 2015-12-29 DIAGNOSIS — I10 Essential (primary) hypertension: Secondary | ICD-10-CM | POA: Diagnosis present

## 2015-12-29 DIAGNOSIS — I2510 Atherosclerotic heart disease of native coronary artery without angina pectoris: Secondary | ICD-10-CM | POA: Insufficient documentation

## 2015-12-29 DIAGNOSIS — Z8249 Family history of ischemic heart disease and other diseases of the circulatory system: Secondary | ICD-10-CM | POA: Diagnosis not present

## 2015-12-29 DIAGNOSIS — M1990 Unspecified osteoarthritis, unspecified site: Secondary | ICD-10-CM | POA: Diagnosis not present

## 2015-12-29 DIAGNOSIS — Z7984 Long term (current) use of oral hypoglycemic drugs: Secondary | ICD-10-CM | POA: Insufficient documentation

## 2015-12-29 DIAGNOSIS — E1169 Type 2 diabetes mellitus with other specified complication: Secondary | ICD-10-CM | POA: Diagnosis present

## 2015-12-29 DIAGNOSIS — E669 Obesity, unspecified: Secondary | ICD-10-CM | POA: Diagnosis present

## 2015-12-29 DIAGNOSIS — I25118 Atherosclerotic heart disease of native coronary artery with other forms of angina pectoris: Secondary | ICD-10-CM | POA: Diagnosis not present

## 2015-12-29 DIAGNOSIS — R079 Chest pain, unspecified: Secondary | ICD-10-CM

## 2015-12-29 DIAGNOSIS — R072 Precordial pain: Secondary | ICD-10-CM

## 2015-12-29 DIAGNOSIS — Z7982 Long term (current) use of aspirin: Secondary | ICD-10-CM | POA: Diagnosis not present

## 2015-12-29 DIAGNOSIS — R9439 Abnormal result of other cardiovascular function study: Secondary | ICD-10-CM

## 2015-12-29 DIAGNOSIS — Z87891 Personal history of nicotine dependence: Secondary | ICD-10-CM

## 2015-12-29 DIAGNOSIS — J45909 Unspecified asthma, uncomplicated: Secondary | ICD-10-CM | POA: Insufficient documentation

## 2015-12-29 DIAGNOSIS — E785 Hyperlipidemia, unspecified: Secondary | ICD-10-CM | POA: Insufficient documentation

## 2015-12-29 DIAGNOSIS — K589 Irritable bowel syndrome without diarrhea: Secondary | ICD-10-CM | POA: Diagnosis not present

## 2015-12-29 HISTORY — PX: CARDIAC CATHETERIZATION: SHX172

## 2015-12-29 LAB — GLUCOSE, CAPILLARY: Glucose-Capillary: 188 mg/dL — ABNORMAL HIGH (ref 65–99)

## 2015-12-29 SURGERY — LEFT HEART CATH AND CORONARY ANGIOGRAPHY
Anesthesia: LOCAL

## 2015-12-29 MED ORDER — MIDAZOLAM HCL 2 MG/2ML IJ SOLN
INTRAMUSCULAR | Status: AC
  Filled 2015-12-29: qty 2

## 2015-12-29 MED ORDER — HEPARIN (PORCINE) IN NACL 2-0.9 UNIT/ML-% IJ SOLN
INTRAMUSCULAR | Status: DC | PRN
  Administered 2015-12-29: 1000 mL

## 2015-12-29 MED ORDER — SODIUM CHLORIDE 0.9 % IV SOLN: 250.0000 mL | INTRAVENOUS | Status: DC | PRN

## 2015-12-29 MED ORDER — LIDOCAINE HCL (PF) 1 % IJ SOLN
INTRAMUSCULAR | Status: AC
  Filled 2015-12-29: qty 30

## 2015-12-29 MED ORDER — SODIUM CHLORIDE 0.9% FLUSH: 3.0000 mL | Freq: Two times a day (BID) | INTRAVENOUS | Status: DC

## 2015-12-29 MED ORDER — HEPARIN SODIUM (PORCINE) 1000 UNIT/ML IJ SOLN
INTRAMUSCULAR | Status: DC | PRN
  Administered 2015-12-29: 4000 [IU] via INTRAVENOUS

## 2015-12-29 MED ORDER — ASPIRIN 81 MG PO CHEW: 81.0000 mg | CHEWABLE_TABLET | ORAL | Status: DC

## 2015-12-29 MED ORDER — IOPAMIDOL (ISOVUE-370) INJECTION 76%
INTRAVENOUS | Status: DC | PRN
  Administered 2015-12-29: 70 mL via INTRAVENOUS

## 2015-12-29 MED ORDER — MIDAZOLAM HCL 2 MG/2ML IJ SOLN
INTRAMUSCULAR | Status: DC | PRN
  Administered 2015-12-29: 0.5 mg via INTRAVENOUS
  Administered 2015-12-29: 1 mg via INTRAVENOUS

## 2015-12-29 MED ORDER — NITROGLYCERIN 1 MG/10 ML FOR IR/CATH LAB
INTRA_ARTERIAL | Status: DC | PRN
  Administered 2015-12-29: 200 ug via INTRACORONARY

## 2015-12-29 MED ORDER — SODIUM CHLORIDE 0.9% FLUSH: 3.0000 mL | INTRAVENOUS | Status: DC | PRN

## 2015-12-29 MED ORDER — FENTANYL CITRATE (PF) 100 MCG/2ML IJ SOLN
INTRAMUSCULAR | Status: AC
  Filled 2015-12-29: qty 2

## 2015-12-29 MED ORDER — HEPARIN (PORCINE) IN NACL 2-0.9 UNIT/ML-% IJ SOLN
INTRAMUSCULAR | Status: AC
  Filled 2015-12-29: qty 1000

## 2015-12-29 MED ORDER — NITROGLYCERIN 1 MG/10 ML FOR IR/CATH LAB
INTRA_ARTERIAL | Status: AC
  Filled 2015-12-29: qty 10

## 2015-12-29 MED ORDER — SODIUM CHLORIDE 0.9 % WEIGHT BASED INFUSION: 3.0000 mL/kg/h | INTRAVENOUS | Status: DC

## 2015-12-29 MED ORDER — VERAPAMIL HCL 2.5 MG/ML IV SOLN
INTRAVENOUS | Status: DC | PRN
  Administered 2015-12-29 (×2): 10 mL via INTRA_ARTERIAL

## 2015-12-29 MED ORDER — LIDOCAINE HCL (PF) 1 % IJ SOLN
INTRAMUSCULAR | Status: DC | PRN
  Administered 2015-12-29: 2 mL

## 2015-12-29 MED ORDER — VERAPAMIL HCL 2.5 MG/ML IV SOLN
INTRAVENOUS | Status: AC
  Filled 2015-12-29: qty 2

## 2015-12-29 MED ORDER — SODIUM CHLORIDE 0.9 % IV SOLN
INTRAVENOUS | Status: DC
  Administered 2015-12-29: 08:00:00 via INTRAVENOUS

## 2015-12-29 MED ORDER — ONDANSETRON HCL 4 MG/2ML IJ SOLN
4.0000 mg | Freq: Four times a day (QID) | INTRAMUSCULAR | Status: DC | PRN
Start: 2015-12-29 — End: 2015-12-29

## 2015-12-29 MED ORDER — IOPAMIDOL (ISOVUE-370) INJECTION 76%
INTRAVENOUS | Status: AC
  Filled 2015-12-29: qty 100

## 2015-12-29 MED ORDER — FENTANYL CITRATE (PF) 100 MCG/2ML IJ SOLN
INTRAMUSCULAR | Status: DC | PRN
  Administered 2015-12-29: 25 ug via INTRAVENOUS
  Administered 2015-12-29: 50 ug via INTRAVENOUS

## 2015-12-29 SURGICAL SUPPLY — 10 items
CATH INFINITI 4FR JL3.5 (CATHETERS) ×2 IMPLANT
CATH INFINITI JR4 5F (CATHETERS) ×2 IMPLANT
DEVICE RAD COMP TR BAND LRG (VASCULAR PRODUCTS) ×2 IMPLANT
GLIDESHEATH SLEND A-KIT 6F 22G (SHEATH) ×2 IMPLANT
KIT HEART LEFT (KITS) ×2 IMPLANT
PACK CARDIAC CATHETERIZATION (CUSTOM PROCEDURE TRAY) ×2 IMPLANT
TRANSDUCER W/STOPCOCK (MISCELLANEOUS) ×2 IMPLANT
TUBING CIL FLEX 10 FLL-RA (TUBING) ×2 IMPLANT
WIRE HI TORQ VERSACORE-J 145CM (WIRE) ×2 IMPLANT
WIRE SAFE-T 1.5MM-J .035X260CM (WIRE) ×2 IMPLANT

## 2015-12-29 NOTE — Discharge Instructions (Signed)
Do not take metformin untill Sunday      Radial Site Care Refer to this sheet in the next few weeks. These instructions provide you with information about caring for yourself after your procedure. Your health care provider may also give you more specific instructions. Your treatment has been planned according to current medical practices, but problems sometimes occur. Call your health care provider if you have any problems or questions after your procedure. WHAT TO EXPECT AFTER THE PROCEDURE After your procedure, it is typical to have the following:  Bruising at the radial site that usually fades within 1-2 weeks.  Blood collecting in the tissue (hematoma) that may be painful to the touch. It should usually decrease in size and tenderness within 1-2 weeks. HOME CARE INSTRUCTIONS  Take medicines only as directed by your health care provider.  You may shower 24-48 hours after the procedure or as directed by your health care provider. Remove the bandage (dressing) and gently wash the site with plain soap and water. Pat the area dry with a clean towel. Do not rub the site, because this may cause bleeding.  Do not take baths, swim, or use a hot tub until your health care provider approves.  Check your insertion site every day for redness, swelling, or drainage.  Do not apply powder or lotion to the site.  Do not flex or bend the affected arm for 24 hours or as directed by your health care provider.  Do not push or pull heavy objects with the affected arm for 24 hours or as directed by your health care provider.  Do not lift over 10 lb (4.5 kg) for 5 days after your procedure or as directed by your health care provider.  Ask your health care provider when it is okay to:  Return to work or school.  Resume usual physical activities or sports.  Resume sexual activity.  Do not drive home if you are discharged the same day as the procedure. Have someone else drive you.  You may drive  24 hours after the procedure unless otherwise instructed by your health care provider.  Do not operate machinery or power tools for 24 hours after the procedure.  If your procedure was done as an outpatient procedure, which means that you went home the same day as your procedure, a responsible adult should be with you for the first 24 hours after you arrive home.  Keep all follow-up visits as directed by your health care provider. This is important. SEEK MEDICAL CARE IF:  You have a fever.  You have chills.  You have increased bleeding from the radial site. Hold pressure on the site. SEEK IMMEDIATE MEDICAL CARE IF:  You have unusual pain at the radial site.  You have redness, warmth, or swelling at the radial site.  You have drainage (other than a small amount of blood on the dressing) from the radial site.  The radial site is bleeding, and the bleeding does not stop after 30 minutes of holding steady pressure on the site.  Your arm or hand becomes pale, cool, tingly, or numb.   This information is not intended to replace advice given to you by your health care provider. Make sure you discuss any questions you have with your health care provider.   Document Released: 09/08/2010 Document Revised: 08/27/2014 Document Reviewed: 02/22/2014 Elsevier Interactive Patient Education [employer].

## 2015-12-29 NOTE — Research (Signed)
[facility] RESEARCH Informed Consent   Subject Name: FELITA BUMP  Subject met inclusion and exclusion criteria.  The informed consent form, study requirements and expectations were reviewed with the subject and questions and concerns were addressed prior to the signing of the consent form.  The subject verbalized understanding of the trial requirements.  The subject agreed to participate in the [place] trial and signed the informed consent.  The informed consent was obtained prior to performance of any protocol-specific procedures for the subject.  A copy of the signed informed consent was given to the subject and a copy was placed in the subject's medical record.  Marlana Salvage 12/29/2015, 08:25 AM

## 2015-12-29 NOTE — Interval H&P Note (Signed)
Cath Lab Visit (complete for each Cath Lab visit)  Clinical Evaluation Leading to the Procedure:   ACS: No.  Non-ACS:    Anginal Classification: CCS III  Anti-ischemic medical therapy: Minimal Therapy (1 class of medications)  Non-Invasive Test Results: Intermediate-risk stress test findings: cardiac mortality 1-3%/year  Prior CABG: No previous CABG      History and Physical Interval Note:  12/29/2015 9:23 AM  Elon Jester  has presented today for surgery, with the diagnosis of cp  abn myoview  The various methods of treatment have been discussed with the patient and family. After consideration of risks, benefits and other options for treatment, the patient has consented to  Procedure(s): Left Heart Cath and Coronary Angiography (N/A) as a surgical intervention .  The patient's history has been reviewed, patient examined, no change in status, stable for surgery.  I have reviewed the patient's chart and labs.  Questions were answered to the patient's satisfaction.     Sinclair Grooms

## 2015-12-29 NOTE — H&P (View-Only) (Signed)
Cardiology Office Note:    Date:  12/27/2015   ID:  Erin Beck, DOB 15-Sep-1940, MRN [id number]  PCP:  [name], MD  Cardiologist:  Dr. [name]   Electrophysiologist:  n/a  Referring MD: Abner Greenspan, MD   Chief Complaint  Patient presents with  . Chest Pain    Follow up; Abnormal Myoview    History of Present Illness:     Erin Beck is a 75 y.o. female with a hx of HTN, HL, diabetes, GERD. She was recently evaluated by Dr. Radford Pax for chest pain, dyspnea and dizziness. Lexiscan Myoview, echocardiogram and event monitor were recommended. Echocardiogram demonstrated mild LVH, normal LV function and mild diastolic dysfunction. Myoview did demonstrate moderate inferior wall ischemia with an EF of 62%. This is felt to be intermediate risk. Dr. Radford Pax asked the patient return today to discuss proceeding with cardiac catheterization to further evaluate.   Here today with her husband. She continues to have episodes of chest discomfort. This seems to come on with emotional stress. She also notes dyspnea with exertion. She denies syncope. Denies orthopnea, PND or edema.   Past Medical History  Diagnosis Date  . Anxiety   . Diabetes mellitus     type II  . Diverticulitis     Hx of  . Hyperlipidemia   . Arthritis     OA  . Osteopenia   . GERD (gastroesophageal reflux disease)   . Hypertension   . Allergic rhinitis   . IBS (irritable bowel syndrome)   . Hematuria     HX OF MICROSCOPIC BLOOD IN URINE AND HX OF CYST ON ONE KIDNEY--BUT NO KIDNEY DISEASE  . Asthma   . Full dentures   . Wears glasses   . Complication of anesthesia     gas bubble rt eye from an old retinal detachment    Past Surgical History  Procedure Laterality Date  . Abdominal hysterectomy    . Cholecystectomy    . Knee arthroscopy  1990    chondromalacia   . Total knee arthroplasty  12/05/2011    Procedure: TOTAL KNEE ARTHROPLASTY;  Surgeon: Magnus Sinning, MD;  Location: [facility];   Service: Orthopedics;  Laterality: Right;  . Appendectomy      per pt  . Joint replacement  2000    LEFT TOTAL KNEE ARTHROPLASTY  . Joint replacement  2013    rt total knee  . Colonoscopy    . Carpal tunnel release      both hands  . Eye surgery      BILATERAL CATARACTS REMOVED  . Eye surgery      gas bubble in rt eye from a retinal detachment  . Ulnar nerve transposition Right 09/15/2014    Procedure: Right cubital tunnel release with transposition of ulnar nerve;  Surgeon: Marianna Payment, MD;  Location: [facility];  Service: Orthopedics;  Laterality: Right;    Current Medications: Outpatient Prescriptions Prior to Visit  Medication Sig Dispense Refill  . acetaminophen (TYLENOL) 325 MG tablet Take 2 tablets (650 mg total) by mouth every 6 (six) hours as needed.    Marland Kitchen albuterol (VENTOLIN HFA) 108 (90 Base) MCG/ACT inhaler Inhale 2 puffs into the lungs every 6 (six) hours as needed for wheezing or shortness of breath.    Marland Kitchen aspirin 81 MG tablet Take 81 mg by mouth daily.    Marland Kitchen glipiZIDE (GLUCOTROL XL) 2.5 MG 24 hr tablet Take 1 tablet (2.5 mg total)  by mouth daily before breakfast. 90 tablet 3  . glucose blood test strip 1 each 2 (two) times daily. Check blood sugar twice daily and as needed.    . hydrochlorothiazide (HYDRODIURIL) 25 MG tablet TAKE 1 TABLET EVERY DAY 90 tablet 3  . lisinopril (PRINIVIL,ZESTRIL) 10 MG tablet TAKE 1 TABLET EVERY DAY 90 tablet 3  . metFORMIN (GLUCOPHAGE-XR) 500 MG 24 hr tablet TAKE 2 TABLETS EVERY EVENING 180 tablet 0  . omeprazole (PRILOSEC) 20 MG capsule TAKE 1 CAPSULE EVERY DAY 90 capsule 2   No facility-administered medications prior to visit.      Allergies:   Naproxen; Naproxen sodium; Aspirin; Atorvastatin; Buspar; Gabapentin; Metformin and related; Norco; Simvastatin; Sulfa antibiotics; and Sulfonamide derivatives   Social History   Social History  . Marital Status: Married    Spouse Name: N/A  . Number of Children: N/A  .  Years of Education: N/A   Social History Main Topics  . Smoking status: Former Smoker -- 1.00 packs/day for 48 years    Quit date: 08/20/2009  . Smokeless tobacco: Never Used  . Alcohol Use: No  . Drug Use: No  . Sexual Activity: Not Asked   Other Topics Concern  . None   Social History Narrative     Family History:  The patient's family history includes Cancer in her father; Diabetes in her brother and father; Heart disease in her maternal grandmother and paternal grandmother; Pneumonia in her mother.   ROS:   Please see the history of present illness.    ROS All other systems reviewed and are negative.   Physical Exam:    VS:  BP 160/60 mmHg  Pulse 98  Ht 5\' 1"  (1.549 m)  Wt 180 lb 9.6 oz (81.92 kg)  BMI 34.14 kg/m2  SpO2 96%   GEN: Well nourished, well developed, in no acute distress HEENT: normal Neck: no JVD, no masses Cardiac: Normal S1/S2, RRR; no murmurs, rubs, or gallops, no edema;     Respiratory:  clear to auscultation bilaterally; no wheezing, rhonchi or rales GI: soft, nontender, nondistended MS: no deformity or atrophy Skin: warm and dry Neuro: No focal deficits  Psych: Alert and oriented x 3, normal affect  Wt Readings from Last 3 Encounters:  12/27/15 180 lb 9.6 oz (81.92 kg)  12/21/15 179 lb (81.194 kg)  12/06/15 179 lb 6.4 oz (81.375 kg)      Studies/Labs Reviewed:     EKG:  EKG is not ordered today.  The ekg ordered today demonstrates n/a  Recent Labs: 06/10/2015: ALT 48*; BUN 12; Creatinine, Ser 0.84; Hemoglobin 14.5; Platelets 227.0; Potassium 4.0; Sodium 139; TSH 2.67   Recent Lipid Panel    Component Value Date/Time   CHOL 223* 06/10/2015 0828   TRIG 268.0* 06/10/2015 0828   HDL 36.50* 06/10/2015 0828   CHOLHDL 6 06/10/2015 0828   VLDL 53.6* 06/10/2015 0828   LDLCALC 127* 03/09/2014 0838   LDLDIRECT 154.0 06/10/2015 0828    Additional studies/ records that were reviewed today include:   Myoview 12/21/15 Moderate inferior wall  ischemia at mid and basal level SDS 9 EF 62% Intermediate Risk  Echo 12/21/15 Mild LVH, EF 60-65%, no RWMA, Gr 1 DD, low normal RVSF   ASSESSMENT:     1. Precordial pain   2. Dizziness   3. HYPERTENSION, BENIGN ESSENTIAL   4. Hyperlipidemia   5. Controlled type 2 diabetes mellitus without complication, without long-term current use of insulin ([place])     PLAN:  In order of problems listed above:  1. Chest pain - She has had episodes of chest pain, dyspnea and dizziness. Recent Myoview is intermediate risk with inferior wall ischemia. EF is normal. Dr. Radford Pax has recommended proceeding with cardiac catheterization.  Risks and benefits of cardiac catheterization have been discussed with the patient.  These include bleeding, infection, kidney damage, stroke, heart attack, death.  The patient understands these risks and is willing to proceed.   -  Arrange LHC   -  Rx for prn NTG given   -  Go to ED if symptoms worse.  2. Dizziness - No hx of syncope.  Event monitor pending.  Echo with normal LVEF.  3. HTN - BP elevated today.  She notes optimal BP at home.  Will continue to monitor and adjust Rx if needed.  4. HL - Statin intol.  Will need to try statin Rx again if + CAD on cath.   5. DM - Hold Glipizide day of cath and Metformin 24 hours pre and 48 hours post cath.   Medication Adjustments/Labs and Tests Ordered: Current medicines are reviewed at length with the patient today.  Concerns regarding medicines are outlined above.  Medication changes, Labs and Tests ordered today are outlined in the Patient Instructions noted below. Patient Instructions  Medication Instructions:  YOU HAVE BEEN GIVEN AN RX FOR NITROGLYCERIN AND HAVE BEEN ADVISED TO HOW AND WHEN USE NTG Labwork: TODAY BMET, CBC W/DIFF, PT/INR Testing/Procedures: Your physician has requested that you have a cardiac catheterization. Cardiac catheterization is used to diagnose and/or treat various heart conditions.  Doctors may recommend this procedure for a number of different reasons. The most common reason is to evaluate chest pain. Chest pain can be a symptom of coronary artery disease (CAD), and cardiac catheterization can show whether plaque is narrowing or blocking your heart's arteries. This procedure is also used to evaluate the valves, as well as measure the blood flow and oxygen levels in different parts of your heart. For further information please visit [email or web address]. Please follow instruction sheet, as given. Follow-Up: 01/12/16 @ 10:10 WITH Emmerson Taddei, PAC  Any Other Special Instructions Will Be Listed Below (If Applicable). If you need a refill on your cardiac medications before your next appointment, please call your pharmacy.    Signed, Richardson Dopp, PA-C  12/27/2015 12:51 PM    [facility] [address], [place], [place]  [address] Phone: [phone]; Fax: [phone]

## 2015-12-30 ENCOUNTER — Ambulatory Visit (INDEPENDENT_AMBULATORY_CARE_PROVIDER_SITE_OTHER): Payer: Commercial Managed Care - HMO | Admitting: Emergency Medicine

## 2015-12-30 ENCOUNTER — Ambulatory Visit: Payer: Commercial Managed Care - HMO

## 2015-12-30 ENCOUNTER — Ambulatory Visit (INDEPENDENT_AMBULATORY_CARE_PROVIDER_SITE_OTHER): Payer: Commercial Managed Care - HMO

## 2015-12-30 VITALS — BP 138/80 | HR 93 | Temp 97.9°F | Resp 17 | Ht 61.0 in | Wt 181.0 lb

## 2015-12-30 DIAGNOSIS — IMO0001 Reserved for inherently not codable concepts without codable children: Secondary | ICD-10-CM

## 2015-12-30 DIAGNOSIS — S8002XA Contusion of left knee, initial encounter: Secondary | ICD-10-CM

## 2015-12-30 DIAGNOSIS — S8001XA Contusion of right knee, initial encounter: Secondary | ICD-10-CM

## 2015-12-30 DIAGNOSIS — S8011XA Contusion of right lower leg, initial encounter: Secondary | ICD-10-CM | POA: Diagnosis not present

## 2015-12-30 DIAGNOSIS — S8992XA Unspecified injury of left lower leg, initial encounter: Secondary | ICD-10-CM | POA: Diagnosis not present

## 2015-12-30 DIAGNOSIS — E1165 Type 2 diabetes mellitus with hyperglycemia: Secondary | ICD-10-CM | POA: Diagnosis not present

## 2015-12-30 DIAGNOSIS — M79661 Pain in right lower leg: Secondary | ICD-10-CM | POA: Diagnosis not present

## 2015-12-30 DIAGNOSIS — S8991XA Unspecified injury of right lower leg, initial encounter: Secondary | ICD-10-CM | POA: Diagnosis not present

## 2015-12-30 DIAGNOSIS — S8012XA Contusion of left lower leg, initial encounter: Secondary | ICD-10-CM

## 2015-12-30 DIAGNOSIS — M79662 Pain in left lower leg: Secondary | ICD-10-CM | POA: Diagnosis not present

## 2015-12-30 LAB — GLUCOSE, POCT (MANUAL RESULT ENTRY): POC Glucose: 129 mg/dl — AB (ref 70–99)

## 2015-12-30 NOTE — Patient Instructions (Addendum)
IF you received an x-ray today, you will receive an invoice from [facility]. Please contact [facility] at [phone] with questions or concerns regarding your invoice.   IF you received labwork today, you will receive an invoice from [employer]. Please contact [facility] at [phone] with questions or concerns regarding your invoice.   Our billing staff will not be able to assist you with questions regarding bills from these companies.  You will be contacted with the lab results as soon as they are available. The fastest way to get your results is to activate your My Chart account. Instructions are located on the last page of this paperwork. If you have not heard from [place] regarding the results in 2 weeks, please contact this office.     Hematoma A hematoma is a collection of blood under the skin, in an organ, in a body space, in a joint space, or in other tissue. The blood can clot to form a lump that you can see and feel. The lump is often firm and may sometimes become sore and tender. Most hematomas get better in a few days to weeks. However, some hematomas may be serious and require medical care. Hematomas can range in size from very small to very large. CAUSES  A hematoma can be caused by a blunt or penetrating injury. It can also be caused by spontaneous leakage from a blood vessel under the skin. Spontaneous leakage from a blood vessel is more likely to occur in older people, especially those taking blood thinners. Sometimes, a hematoma can develop after certain medical procedures. SIGNS AND SYMPTOMS   A firm lump on the body.  Possible pain and tenderness in the area.  Bruising.Blue, dark blue, purple-red, or yellowish skin may appear at the site of the hematoma if the hematoma is close to the surface of the skin. For hematomas in deeper tissues or body spaces, the signs and symptoms may be subtle. For example, an intra-abdominal  hematoma may cause abdominal pain, weakness, fainting, and shortness of breath. An intracranial hematoma may cause a headache or symptoms such as weakness, trouble speaking, or a change in consciousness. DIAGNOSIS  A hematoma can usually be diagnosed based on your medical history and a physical exam. Imaging tests may be needed if your health care provider suspects a hematoma in deeper tissues or body spaces, such as the abdomen, head, or chest. These tests may include ultrasonography or a CT scan.  TREATMENT  Hematomas usually go away on their own over time. Rarely does the blood need to be drained out of the body. Large hematomas or those that may affect vital organs will sometimes need surgical drainage or monitoring. HOME CARE INSTRUCTIONS   Apply ice to the injured area:   Put ice in a plastic bag.   Place a towel between your skin and the bag.   Leave the ice on for 20 minutes, 2-3 times a day for the first 1 to 2 days.   After the first 2 days, switch to using warm compresses on the hematoma.   Elevate the injured area to help decrease pain and swelling. Wrapping the area with an elastic bandage may also be helpful. Compression helps to reduce swelling and promotes shrinking of the hematoma. Make sure the bandage is not wrapped too tight.   If your hematoma is on a lower extremity and is painful, crutches may be helpful for a couple days.   Only take over-the-counter or  prescription medicines as directed by your health care provider. SEEK IMMEDIATE MEDICAL CARE IF:   You have increasing pain, or your pain is not controlled with medicine.   You have a fever.   You have worsening swelling or discoloration.   Your skin over the hematoma breaks or starts bleeding.   Your hematoma is in your chest or abdomen and you have weakness, shortness of breath, or a change in consciousness.  Your hematoma is on your scalp (caused by a fall or injury) and you have a worsening  headache or a change in alertness or consciousness. MAKE SURE YOU:   Understand these instructions.  Will watch your condition.  Will get help right away if you are not doing well or get worse.   This information is not intended to replace advice given to you by your health care provider. Make sure you discuss any questions you have with your health care provider.   Document Released: 03/20/2004 Document Revised: 04/08/2013 Document Reviewed: 01/14/2013 Elsevier Interactive Patient Education [employer].

## 2015-12-30 NOTE — Progress Notes (Signed)
Subjective:  This chart was scribed for Arlyss Queen MD, by Tamsen Roers, at [facility].  This patient was seen in room 5 and the patient's care was started at 1:46 PM.   Chief Complaint  Patient presents with  . Leg Injury    both legs     Patient ID: Erin Beck, female    DOB: 06-Jan-1941, 75 y.o.   MRN: [id number]  HPI HPI Comments: Erin Beck is a 75 y.o. female who presents to the [facility] complaining of a leg pain  (bilaterally) after falling out of her bed three nights ago.  Her hips/back are also sore from the fall.  Patient hit both of her knees after the fall on a wooden stool which she uses to get up and down from her bed. Patient was turning from her right to her left side and didn't realize that she was at the end of her bed when she fell. She currently uses a cane in order to walk. Patient has had both of her knees replaced.   Patient was admitted into the cath lab for a catheterization yesterday 12-29-15. Patient currently has a heart monitor.  Patient has diabetes and is compliant with her Glipizide and Metformin.   Patient Active Problem List   Diagnosis Date Noted  . Chest pain 12/29/2015  . Abnormal stress test 12/29/2015  . Type 2 diabetes mellitus with hyperglycemia, without long-term current use of insulin ([place]) 06/17/2015  . Hearing loss of aging 06/14/2015  . 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 Chronic neck and back pain 06/15/2014  . Acute ear pain 06/10/2014  . Right shoulder pain 03/12/2014  . Vitreomacular adhesion 05/05/2013  . Unspecified asthma(493.90) 04/23/2013  . Personal history of colonic polyps 03/03/2013  . Post-menopausal 10/17/2012  . Obesity 10/31/2011  . IBS (irritable bowel syndrome) 08/10/2011  . GERD 07/31/2010  . Allergic rhinitis 12/05/2007  . HYPERTENSION, BENIGN ESSENTIAL 02/05/2007  . TOBACCO USE, QUIT 02/05/2007  . Hyperlipidemia 01/31/2007  . ANXIETY 01/31/2007    . OSTEOARTHRITIS 01/31/2007  . DIVERTICULITIS, HX OF 01/31/2007   Past Medical History  Diagnosis Date  . Anxiety   . Diabetes mellitus     type II  . Diverticulitis     Hx of  . Hyperlipidemia   . Arthritis     OA  . Osteopenia   . GERD (gastroesophageal reflux disease)   . Hypertension   . Allergic rhinitis   . IBS (irritable bowel syndrome)   . Hematuria     HX OF MICROSCOPIC BLOOD IN URINE AND HX OF CYST ON ONE KIDNEY--BUT NO KIDNEY DISEASE  . Asthma   . Full dentures   . Wears glasses   . Complication of anesthesia     gas bubble rt eye from an old retinal detachment   Past Surgical History  Procedure Laterality Date  . Abdominal hysterectomy    . Cholecystectomy    . Knee arthroscopy  1990    chondromalacia   . Total knee arthroplasty  12/05/2011    Procedure: TOTAL KNEE ARTHROPLASTY;  Surgeon: Magnus Sinning, MD;  Location: [facility];  Service: Orthopedics;  Laterality: Right;  . Appendectomy      per pt  . Joint replacement  2000    LEFT TOTAL KNEE ARTHROPLASTY  . Joint replacement  2013    rt total knee  . Colonoscopy    . Carpal tunnel release  both hands  . Eye surgery      BILATERAL CATARACTS REMOVED  . Eye surgery      gas bubble in rt eye from a retinal detachment  . Ulnar nerve transposition Right 09/15/2014    Procedure: Right cubital tunnel release with transposition of ulnar nerve;  Surgeon: Marianna Payment, MD;  Location: [place];  Service: Orthopedics;  Laterality: Right;  . Cardiac catheterization N/A 12/29/2015    Procedure: Left Heart Cath and Coronary Angiography;  Surgeon: Belva Crome, MD;  Location: [place] [facility];  Service: Cardiovascular;  Laterality: N/A;   Allergies  Allergen Reactions  . Naproxen Rash    Trouble breathing  . Naproxen Sodium Rash    Trouble breathing  . Aspirin     REACTION: burns stomach PT STATES SHE DOES TOLERATE THE 81 MG ASPIRIN DAILY  . Atorvastatin     REACTION: muscle  pain  . Buspar [Buspirone Hcl]     Multiple side eff  . Gabapentin Other (See Comments)    Palpitations/ dizziness   . Metformin And Related Diarrhea  . Norco [Hydrocodone-Acetaminophen] Other (See Comments)    Dizzy and problems remembering   . Simvastatin     REACTION: GI side eff and swelling  . Sulfa Antibiotics Other (See Comments)    Severe joint pain  . Sulfonamide Derivatives     REACTION: reaction not known   Prior to Admission medications   Medication Sig Start Date End Date Taking? Authorizing Provider  acetaminophen (TYLENOL) 325 MG tablet Take 2 tablets (650 mg total) by mouth every 6 (six) hours as needed. 04/23/13  Yes Delfina Redwood, MD  aspirin 81 MG tablet Take 81 mg by mouth daily.   Yes Historical Provider, MD  glipiZIDE (GLUCOTROL XL) 2.5 MG 24 hr tablet Take 1 tablet (2.5 mg total) by mouth daily before breakfast. 10/18/15  Yes Philemon Kingdom, MD  glucose blood test strip 1 each 2 (two) times daily. Check blood sugar twice daily and as needed.   Yes Historical Provider, MD  hydrochlorothiazide (HYDRODIURIL) 25 MG tablet TAKE 1 TABLET EVERY DAY 11/19/14  Yes Abner Greenspan, MD  lisinopril (PRINIVIL,ZESTRIL) 10 MG tablet TAKE 1 TABLET EVERY DAY 11/19/14  Yes Abner Greenspan, MD  omeprazole (PRILOSEC) 20 MG capsule TAKE 1 CAPSULE EVERY DAY 08/10/15  Yes Abner Greenspan, MD  albuterol (VENTOLIN HFA) 108 (90 Base) MCG/ACT inhaler Inhale 2 puffs into the lungs every 6 (six) hours as needed for wheezing or shortness of breath. Reported on 12/30/2015    Historical Provider, MD  metFORMIN (GLUCOPHAGE-XR) 500 MG 24 hr tablet TAKE 2 TABLETS EVERY EVENING Patient not taking: Reported on 12/30/2015 11/25/15   Philemon Kingdom, MD  nitroGLYCERIN (NITROSTAT) 0.4 MG SL tablet Place 1 tablet (0.4 mg total) under the tongue every 5 (five) minutes as needed for chest pain. Patient not taking: Reported on 12/30/2015 12/27/15   Liliane Shi, PA-C   Social History   Social History  . Marital  Status: Married    Spouse Name: N/A  . Number of Children: N/A  . Years of Education: N/A   Occupational History  . Not on file.   Social History Main Topics  . Smoking status: Former Smoker -- 1.00 packs/day for 48 years    Quit date: 08/20/2009  . Smokeless tobacco: Never Used  . Alcohol Use: No  . Drug Use: No  . Sexual Activity: Not on file   Other Topics  Concern  . Not on file   Social History Narrative      Review of Systems  Constitutional: Negative for fever and chills.  HENT: Negative for congestion and drooling.   Eyes: Negative for pain, redness and itching.  Respiratory: Negative for choking.   Gastrointestinal: Negative for nausea and vomiting.  Musculoskeletal: Positive for back pain and gait problem. Negative for neck pain and neck stiffness.  Neurological: Negative for syncope and speech difficulty.       Objective:   Physical Exam  Filed Vitals:   12/30/15 1250  BP: 138/80  Pulse: 93  Temp: 97.9 F (36.6 C)  TempSrc: Oral  Resp: 17  Height: 5\' 1"  (1.549 m)  Weight: 181 lb (82.101 kg)  SpO2: 97%    CONSTITUTIONAL: Well developed/well nourished HEAD: Normocephalic/atraumatic EYES: EOMI/PERRL ENMT: Mucous membranes moist NECK: supple no meningeal signs SPINE/BACK:entire spine nontender LUNGS: Lungs are clear to auscultation bilaterally, no apparent distress NEURO: Pt is awake/alert/appropriate, moves all extremitiesx4.  No facial droop.   EXTREMITIES: She has surgical scars over both knees, Pain with flexion and extension of right knee, Below right knee is a large 3 by 4 inch hematoma, There is a healed scar over the left knee with good flexion and extension, Just below the knee is a 3 by 4 inch hematoma with a small abrasion.  SKIN: warm, color normal PSYCH: no abnormalities of mood noted, alert and oriented to situation  Results for orders placed or performed in visit on 12/30/15  POCT glucose (manual entry)  Result Value Ref Range    POC Glucose 129 (A) 70 - 99 mg/dl  Dg Knee 1-2 Views Left  12/30/2015  CLINICAL DATA:  Left knee injury after falling out of bed 3 nights ago. EXAM: LEFT KNEE - 1-2 VIEW COMPARISON:  12/30/2015 FINDINGS: Total left knee arthroplasty intact and normally located. No evidence of acute fracture or dislocation. No significant joint effusion. IMPRESSION: No acute findings. Electronically Signed   By: Marin Olp M.D.   On: 12/30/2015 14:46   Dg Knee 1-2 Views Right  12/30/2015  CLINICAL DATA:  Right knee injury from fall off bed. EXAM: RIGHT KNEE - 1-2 VIEW COMPARISON:  12/05/2011 FINDINGS: Examination demonstrates bilateral total knee arthroplasties intact. No acute fracture or dislocation. No significant joint effusion. IMPRESSION: No acute findings. Electronically Signed   By: Marin Olp M.D.   On: 12/30/2015 14:37   Dg Tibia/fibula Left  12/30/2015  CLINICAL DATA:  Left lower leg pain after falling out of bed 3 nights ago. EXAM: LEFT TIBIA AND FIBULA - 2 VIEW COMPARISON:  None. FINDINGS: Left total knee arthroplasty intact. No acute fracture or dislocation. IMPRESSION: No acute findings. Electronically Signed   By: Marin Olp M.D.   On: 12/30/2015 14:46   Dg Tibia/fibula Right  12/30/2015  CLINICAL DATA:  Right lower leg pain from fall off bed. EXAM: RIGHT TIBIA AND FIBULA - 2 VIEW COMPARISON:  Right knee 12/05/2011 FINDINGS: Right total knee arthroplasty is intact. No acute fracture or dislocation. IMPRESSION: No acute findings. Electronically Signed   By: Marin Olp M.D.   On: 12/30/2015 14:38       Assessment & Plan:  Areas will be treated with rest iced and Tylenol.I personally performed the services described in this documentation, which was scribed in my presence. The recorded information has been reviewed and is accurate.  Darlyne Russian, MD

## 2016-01-02 ENCOUNTER — Telehealth: Payer: Self-pay

## 2016-01-02 NOTE — Telephone Encounter (Signed)
I spoke with pt and last thurs pt had heart cath and had to go off metformin; pt was supposed to restart metformin today but pt said when taking metformin she is dizzy,h/a and hurts all over,and hot flashes. Today being off med for 5 days pt does not have any of these symptoms and pt wants to know if there is a different med pt could take instead of metformin. [facility]. Pt request cb.

## 2016-01-02 NOTE — Telephone Encounter (Signed)
Stay off of it  F/u in 3 mo with lab prior Watch diet very closely for sugar/carbs Will see if we need to replace it then

## 2016-01-02 NOTE — Telephone Encounter (Signed)
Erin Beck, Can you please advise pt to stay on the lower dose of Metformin ER she can tolerate, even if it's 500 mg at dinnertime.

## 2016-01-02 NOTE — Telephone Encounter (Signed)
No answer on call back 

## 2016-01-02 NOTE — Telephone Encounter (Signed)
Actually - I forgot she was seeing her (sorry about that) - follow up with her instead of me  I will CC her to let her know what is going on (with the metformin), thanks

## 2016-01-02 NOTE — Telephone Encounter (Signed)
Patient advised.  OV appt scheduled with labs prior.  Patient is seeing Dr. Cruzita Lederer in July and asks if she still needs to keep the appt with Dr. Cruzita Lederer?

## 2016-01-02 NOTE — Telephone Encounter (Signed)
PLEASE NOTE: All timestamps contained within this report are represented as [place] Standard Time. CONFIDENTIALTY NOTICE: This fax transmission is intended only for the addressee. It contains information that is legally privileged, confidential or otherwise protected from use or disclosure. If you are not the intended recipient, you are strictly prohibited from reviewing, disclosing, copying using or disseminating any of this information or taking any action in reliance on or regarding this information. If you have received this fax in error, please notify us immediately by telephone so that we can arrange for its return to [place]. Phone: [phone], Toll-Free: [phone], Fax: [phone] Page: 1 of 1 Call Id: [id number] [place] Day - Client Nonclinical Telephone Record [place] Day - Client Client Site [place] MD Contact Type Call Who Is Calling Patient / Member / Family / Caregiver Caller Name [place] Phone Number [phone] Patient Name Erin Beck Call Type Message Only Information Provided Reason for Call Medication Question Initial Comment Caller states, question about an Rx she is taking, she had to stop taking Metformin recently for a test, she feels that Rx was causing several of her issues ; she wants to speak w/ her Dr about this Additional Comment Call Closed By: Eather Colas Transaction Date/Time: 01/02/2016 8:47:03 AM (ET)

## 2016-01-02 NOTE — Telephone Encounter (Signed)
Spoke with patient and she prefers to keep these appts with Dr. Glori Bickers just in case she needs to consult with her after seeing Dr. Cruzita Lederer.

## 2016-01-03 NOTE — Telephone Encounter (Signed)
Let's try to move her appointment sooner so I can go over the sugars with her and discussed different alternatives. She canceled her appointment for May, but I would really like to see her sooner than July. In the meantime, stay only on glipizide.

## 2016-01-03 NOTE — Telephone Encounter (Signed)
Returned call to pt. Pt stated that she is completely unable to take the Metformin ER at all. She said she cannot tolerate it at all. She would like Dr Cruzita Lederer to give her something different. She said she will not take Metformin any longer.

## 2016-01-03 NOTE — Telephone Encounter (Signed)
Called pt and advised her per Dr Arman Filter message. Pt voiced understanding and scheduled a follow up for this Friday.

## 2016-01-06 ENCOUNTER — Encounter: Payer: Self-pay | Admitting: Internal Medicine

## 2016-01-06 ENCOUNTER — Other Ambulatory Visit (INDEPENDENT_AMBULATORY_CARE_PROVIDER_SITE_OTHER): Payer: Commercial Managed Care - HMO | Admitting: *Deleted

## 2016-01-06 ENCOUNTER — Other Ambulatory Visit: Payer: Self-pay | Admitting: *Deleted

## 2016-01-06 ENCOUNTER — Ambulatory Visit (INDEPENDENT_AMBULATORY_CARE_PROVIDER_SITE_OTHER): Payer: Commercial Managed Care - HMO | Admitting: Internal Medicine

## 2016-01-06 VITALS — BP 122/64 | HR 93 | Temp 98.2°F | Resp 14 | Wt 176.6 lb

## 2016-01-06 DIAGNOSIS — E1165 Type 2 diabetes mellitus with hyperglycemia: Secondary | ICD-10-CM | POA: Diagnosis not present

## 2016-01-06 LAB — POCT GLYCOSYLATED HEMOGLOBIN (HGB A1C)
HEMOGLOBIN A1C: 6.4
Hemoglobin A1C: 6

## 2016-01-06 MED ORDER — GLIPIZIDE ER 2.5 MG PO TB24: 5.0000 mg | ORAL_TABLET | Freq: Every day | ORAL | Status: DC

## 2016-01-06 NOTE — Patient Instructions (Signed)
Patient Instructions  Please move - Glipizide XL dose (5 mg) all in am.  Please return in 3 months with your sugar log.

## 2016-01-06 NOTE — Progress Notes (Signed)
Patient ID: Erin Beck, female   DOB: 1941/06/27, 75 y.o.   MRN: [id number]  HPI: Erin Beck is a 75 y.o.-year-old female, returning for f/u for DM2, non-insulin-dependent, dx 2005, uncontrolled, with complications (CAD). Last visit 4 mo ago.  Since last visit, she saw Dr Radford Pax (cardiology) >> event monitor and then chemical stress test >> positive >> catheterization >> plaque building, but not stent indicated.  Last hemoglobin A1c was: Lab Results  Component Value Date   HGBA1C 6.4 06/10/2015   HGBA1C 6.4 03/15/2015   HGBA1C 6.8* 12/14/2014  Received labs from Dr. Erlinda Hong [address], 08/30/2014): - HbA1c 6.9%, decreased from 8.0%  Pt is on a regimen of: - Glipizide XL 5 >> 2.5 >> 5 mg in am Stopped Metformin ER 1500 mg in pm before cath but she was not feeling well on it (diarrhea, weakness, thirst, leg swelling, blurry vision) She has not been on insulin before, would not like to stick herself.  Pt checks her sugars 2-3 a day: - am: 170-180 >> 111-154 >> 90-127 >> 100-112 >> 91-140 >> 101-130 >> 94-120 >> 80-119 >> 90-153 - 2h after b'fast: n/c >> 242, 273 >> 150s >> n/c >> 136-252 >> 89, 184, 231 >> 127-152 >> 99-144 >> 112-140 - before lunch: n/c >> 75-120 >> 118-154, 189, 209 >> 97-152 >> 79, 87-136 >> 87-127 >> 107-138, 151 - 2h after lunch: n/c >> 200-210 (?) >> 115-200 >> 121-146 >> 114-150 >> 91-149, 163 >> 117-147 - before dinner: n/c >> 113-153 >> n/c >> 100-130 >> 80, 95-143 >> 82-132 >> 81-102 >> 91-137, 151, 171 >> 108-134, 182 - 2h after dinner: low 200s >> 153, 161 >> 150-160 >> 120-150 >> 123-194 >> 103-190 >> 121-177 >> 95-162 >> 130-182, 236 - bedtime: n/c >> 164 >> 120-140 >> 100, 120-140 >> 105-180 >> 81, 84-122 >> 80-133 >> 80-126 >> 123-157 - nighttime: n/c >> 80 x1 >> 98-105 >> 91-132 >> n/c >> 88-115 >> 83-111 >> 75s (not in last mo) >> 115-164 No lows. Lowest sugar was 80s >> feels them >> 75 (at night) >> 90;  she has hypoglycemia awareness at 80.   Highest sugar was 220 >> 160 >> 210 >> 252 >> 231 >> 177 >> 171 >> 236x1.  Pt's meals are: - Breakfast: cereals - Lunch: salads, 1 sandwich, leftovers - Dinner: salmon/chicken, vegetables - Snacks: 1 fruit, crackers - not every day Cut out soft drinks.  - no CKD, last BUN/creatinine:  Lab Results  Component Value Date   BUN 14 12/27/2015   CREATININE 0.72 12/27/2015  On Lisinopril. - last set of lipids: Lab Results  Component Value Date   CHOL 223* 06/10/2015   HDL 36.50* 06/10/2015   LDLCALC 127* 03/09/2014   LDLDIRECT 154.0 06/10/2015   TRIG 268.0* 06/10/2015   CHOLHDL 6 06/10/2015  She was on a statin >> AP.  - last eye exam was in 07/2014. No DR. Had cataracts sx. - no numbness and tingling in her feet.  I reviewed pt's medications, allergies, PMH, social hx, family hx, and changes were documented in the history of present illness. Otherwise, unchanged from my initial visit note.  ROS: Constitutional: no weight gain, no fatigue, no subjective hyperthermia/hypothermia Eyes: no blurry vision, no xerophthalmia ENT: no sore throat, no nodules palpated in throat, no dysphagia/odynophagia, no hoarseness, + decreased hearing Cardiovascular: no CP/SOB/palpitations/leg swelling Respiratory: no cough/SOB Gastrointestinal: no N/V/D/C Musculoskeletal: no muscle/joint aches Skin: no rashes Neurological: no tremors/numbness/tingling/dizziness  PE:  There were no vitals taken for this visit. There is no weight on file to calculate BMI. Wt Readings from Last 3 Encounters:  12/30/15 181 lb (82.101 kg)  12/29/15 180 lb (81.647 kg)  12/27/15 180 lb 9.6 oz (81.92 kg)   Constitutional: overweight, in NAD, nasal congestion Eyes: PERRLA, EOMI, no exophthalmos ENT: moist mucous membranes, no thyromegaly, no cervical lymphadenopathy Cardiovascular: RRR, No MRG Respiratory: CTA B Gastrointestinal: abdomen soft, NT, ND, BS+ Musculoskeletal: no deformities, strength intact in all  4 Skin: moist, warm, no rashes, + bruises on shin - fell out of bed Neurological: no tremor with outstretched hands, DTR normal in all 4  ASSESSMENT: 1. DM2, non-insulin-dependent, uncontrolled, with complications - CAD  PLAN:  1. Patient with long-standing, well controlled diabetes, on oral antidiabetic regimen, with great control. She had some low CBGs in the 70s in the past >> we reduced the Glipizide ER to 2.5 mg daily at last visit. However, she stopped Metformin b/f her cath and she felt much better w/o it >> stayed off afterwards. To compensate, she increased the Glipizide XL back to 5 mg (but takes 2.5 mg bid) in last week. Sugars are a little higher esp. In am. Will continue with only Glipizide XL but advised he to move it all in am. - Last Hba1c was great, 6.0%!  - I suggested to:  Patient Instructions  Please move - Glipizide XL dose (5 mg) all in am.  Please return in 3 months with your sugar log.    - continue checking sugars at different times of the day - check 1-2 times a day, rotating checks - advised for yearly eye exams >> she needs a new one >> advised to schedule - check HbA1 today >> 6.4% (slightly higher) - UTD with flu shot - Return to clinic in 3 mo with sugar log

## 2016-01-11 NOTE — Progress Notes (Signed)
Cardiology Office Note:    Date:  01/12/2016   ID:  Erin Beck, DOB May 12, 1941, MRN [id number]  PCP:  [name], MD  Cardiologist:  Dr. [name]   Electrophysiologist:  n/a  Referring MD: Abner Greenspan, MD   Chief Complaint  Patient presents with  . Coronary Artery Disease    s/p cardiac cath    History of Present Illness:     Erin Beck is a 75 y.o. female with a hx of HTN, HL, diabetes, GERD. She was recently evaluated by Dr. Radford Pax for chest pain, dyspnea and dizziness. Lexiscan Myoview, echocardiogram and event monitor were recommended. Echocardiogram demonstrated mild LVH, normal LV function and mild diastolic dysfunction. Myoview did demonstrate moderate inferior wall ischemia with an EF of 62%. This is felt to be intermediate risk. LHC was arranged.  This demonstrated luminal irregs in the LAD and RI and no sig obstructive CAD in any territory.  Patient did have improvement in artery diameter with IC NTG.  Nuclear study was felt to be false +.  She returns for FU.  Here with her husband.  She is feeling well.  She thinks all of her symptoms are related to Metformin.  She stopped this drug and feels much better.  Denies dyspnea, orthopnea, PND, edema, syncope.  She still has occ CP on the R side assoc with belching.     Past Medical History  Diagnosis Date  . Anxiety   . Diabetes mellitus     type II  . Diverticulitis     Hx of  . Hyperlipidemia   . Arthritis     OA  . Osteopenia   . GERD (gastroesophageal reflux disease)   . Hypertension   . Allergic rhinitis   . IBS (irritable bowel syndrome)   . Hematuria     HX OF MICROSCOPIC BLOOD IN URINE AND HX OF CYST ON ONE KIDNEY--BUT NO KIDNEY DISEASE  . Asthma   . Full dentures   . Wears glasses   . Complication of anesthesia     gas bubble rt eye from an old retinal detachment    Past Surgical History  Procedure Laterality Date  . Abdominal hysterectomy    . Cholecystectomy    . Knee  arthroscopy  1990    chondromalacia   . Total knee arthroplasty  12/05/2011    Procedure: TOTAL KNEE ARTHROPLASTY;  Surgeon: Magnus Sinning, MD;  Location: [facility];  Service: Orthopedics;  Laterality: Right;  . Appendectomy      per pt  . Joint replacement  2000    LEFT TOTAL KNEE ARTHROPLASTY  . Joint replacement  2013    rt total knee  . Colonoscopy    . Carpal tunnel release      both hands  . Eye surgery      BILATERAL CATARACTS REMOVED  . Eye surgery      gas bubble in rt eye from a retinal detachment  . Ulnar nerve transposition Right 09/15/2014    Procedure: Right cubital tunnel release with transposition of ulnar nerve;  Surgeon: Marianna Payment, MD;  Location: [place];  Service: Orthopedics;  Laterality: Right;  . Cardiac catheterization N/A 12/29/2015    Procedure: Left Heart Cath and Coronary Angiography;  Surgeon: Belva Crome, MD;  Location: [facility];  Service: Cardiovascular;  Laterality: N/A;    Current Medications: Outpatient Prescriptions Prior to Visit  Medication Sig Dispense Refill  . acetaminophen (TYLENOL)  325 MG tablet Take 2 tablets (650 mg total) by mouth every 6 (six) hours as needed.    Marland Kitchen albuterol (VENTOLIN HFA) 108 (90 Base) MCG/ACT inhaler Inhale 2 puffs into the lungs every 6 (six) hours as needed for wheezing or shortness of breath. Reported on 12/30/2015    . aspirin 81 MG tablet Take 81 mg by mouth daily.    Marland Kitchen glipiZIDE (GLUCOTROL XL) 2.5 MG 24 hr tablet Take 2 tablets (5 mg total) by mouth daily before breakfast. 180 tablet 3  . glucose blood test strip 1 each 2 (two) times daily. Check blood sugar twice daily and as needed.    . hydrochlorothiazide (HYDRODIURIL) 25 MG tablet TAKE 1 TABLET EVERY DAY 90 tablet 3  . lisinopril (PRINIVIL,ZESTRIL) 10 MG tablet TAKE 1 TABLET EVERY DAY 90 tablet 3  . nitroGLYCERIN (NITROSTAT) 0.4 MG SL tablet Place 1 tablet (0.4 mg total) under the tongue every 5 (five) minutes as needed for  chest pain. 25 tablet 3  . metFORMIN (GLUCOPHAGE-XR) 500 MG 24 hr tablet TAKE 2 TABLETS EVERY EVENING (Patient not taking: Reported on 12/30/2015) 180 tablet 0  . omeprazole (PRILOSEC) 20 MG capsule TAKE 1 CAPSULE EVERY DAY (Patient not taking: Reported on 01/12/2016) 90 capsule 2   No facility-administered medications prior to visit.      Allergies:   Naproxen; Naproxen sodium; Aspirin; Atorvastatin; Buspar; Gabapentin; Metformin and related; Norco; Simvastatin; Sulfa antibiotics; and Sulfonamide derivatives   Social History   Social History  . Marital Status: Married    Spouse Name: N/A  . Number of Children: N/A  . Years of Education: N/A   Social History Main Topics  . Smoking status: Former Smoker -- 1.00 packs/day for 48 years    Quit date: 08/20/2009  . Smokeless tobacco: Never Used  . Alcohol Use: No  . Drug Use: No  . Sexual Activity: Not Asked   Other Topics Concern  . None   Social History Narrative     Family History:  The patient's family history includes Cancer in her father; Diabetes in her brother and father; Heart disease in her maternal grandmother and paternal grandmother; Pneumonia in her mother.   ROS:   Please see the history of present illness.    ROS All other systems reviewed and are negative.   Physical Exam:    VS:  BP 118/60 mmHg  Pulse 78  Ht 5\' 1"  (1.549 m)  Wt 178 lb 12.8 oz (81.103 kg)  BMI 33.80 kg/m2   GEN: Well nourished, well developed, in no acute distress HEENT: normal Neck: no JVD, no masses Cardiac: Normal S1/S2, RRR; no murmurs, rubs, or gallops, no edema;  right wrist without hematoma or mass    Respiratory:  clear to auscultation bilaterally; no wheezing, rhonchi or rales GI: soft, nontender, nondistended MS: no deformity or atrophy Skin: warm and dry Neuro: No focal deficits  Psych: Alert and oriented x 3, normal affect  Wt Readings from Last 3 Encounters:  01/12/16 178 lb 12.8 oz (81.103 kg)  01/06/16 176 lb 9.6 oz  (80.105 kg)  12/30/15 181 lb (82.101 kg)      Studies/Labs Reviewed:     EKG:  EKG is  ordered today.  The ekg ordered today demonstrates NSR, HR 77, normal axis, QTc 420 ms  Recent Labs: 06/10/2015: ALT 48*; TSH 2.67 12/27/2015: BUN 14; Creat 0.72; Hemoglobin 14.9; Platelets 335; Potassium 4.0; Sodium 137   Recent Lipid Panel    Component Value Date/Time  CHOL 223* 06/10/2015 0828   TRIG 268.0* 06/10/2015 0828   HDL 36.50* 06/10/2015 0828   CHOLHDL 6 06/10/2015 0828   VLDL 53.6* 06/10/2015 0828   LDLCALC 127* 03/09/2014 0838   LDLDIRECT 154.0 06/10/2015 0828    Additional studies/ records that were reviewed today include:   LHC 12/29/15 LAD mid 40% RI 60% EF 55-65%  Myoview 12/21/15 Moderate inferior wall ischemia at mid and basal level SDS 9 EF 62% Intermediate Risk  Echo 12/21/15 Mild LVH, EF 60-65%, no RWMA, Gr 1 DD, low normal RVSF   ASSESSMENT:     1. Coronary artery disease involving native coronary artery of native heart without angina pectoris   2. Hyperlipidemia   3. HYPERTENSION, BENIGN ESSENTIAL   4. Dizziness     PLAN:     In order of problems listed above:  1. CAD - Non-obs CAD by recent cath.  Plan med Rx.  Continue ASA.  She has been intol of Simva and Atorva.  Will try Prava 20 mg twice a week to see if she can tolerate.    2. HL - LDL > 150 in 10/16.  Will start Prava 20 mg twice a week.  Check Lipids and LFTs in 3 mos.  If can tol, try 3 times a week.    3. HTN - BP controlled.   4. Dizziness - Event monitor pending.  Symptoms much improved off of Metformin.    Medication Adjustments/Labs and Tests Ordered: Current medicines are reviewed at length with the patient today.  Concerns regarding medicines are outlined above.  Medication changes, Labs and Tests ordered today are outlined in the Patient Instructions noted below. Patient Instructions  Medication Instructions:  1. START PRAVASTATIN 20 MG 1 TABLET 2 TIMES A WEEK; IF YOU TOLERATE OK  THE PLEASE LET us KNOW [phone] Labwork: 04/11/16.Marland KitchenMarland KitchenFASTING LIPID AND LIVER PANEL TO BE DONE IN 3 MONTHS Testing/Procedures: NONE Follow-Up: DR. Radford Pax 04/12/16 @ 11 AM Any Other Special Instructions Will Be Listed Below (If Applicable). If you need a refill on your cardiac medications before your next appointment, please call your pharmacy.    Signed, Richardson Dopp, PA-C  01/12/2016 6:07 PM    [facility] [address], [place], [place]  [address] Phone: [phone]; Fax: [phone]

## 2016-01-12 ENCOUNTER — Ambulatory Visit (INDEPENDENT_AMBULATORY_CARE_PROVIDER_SITE_OTHER): Payer: Commercial Managed Care - HMO | Admitting: Physician Assistant

## 2016-01-12 ENCOUNTER — Encounter: Payer: Self-pay | Admitting: Physician Assistant

## 2016-01-12 VITALS — BP 118/60 | HR 78 | Ht 61.0 in | Wt 178.8 lb

## 2016-01-12 DIAGNOSIS — I2510 Atherosclerotic heart disease of native coronary artery without angina pectoris: Secondary | ICD-10-CM | POA: Diagnosis not present

## 2016-01-12 DIAGNOSIS — E785 Hyperlipidemia, unspecified: Secondary | ICD-10-CM | POA: Diagnosis not present

## 2016-01-12 DIAGNOSIS — R42 Dizziness and giddiness: Secondary | ICD-10-CM | POA: Diagnosis not present

## 2016-01-12 DIAGNOSIS — I10 Essential (primary) hypertension: Secondary | ICD-10-CM

## 2016-01-12 MED ORDER — PRAVASTATIN SODIUM 20 MG PO TABS: 20.0000 mg | ORAL_TABLET | Freq: Every evening | ORAL | Status: DC

## 2016-01-12 NOTE — Patient Instructions (Addendum)
Medication Instructions:  1. START PRAVASTATIN 20 MG 1 TABLET 2 TIMES A WEEK; IF YOU TOLERATE OK THE PLEASE LET us KNOW [phone] Labwork: 04/11/16.Marland KitchenMarland KitchenFASTING LIPID AND LIVER PANEL TO BE DONE IN 3 MONTHS Testing/Procedures: NONE Follow-Up: DR. Radford Pax 04/12/16 @ 11 AM Any Other Special Instructions Will Be Listed Below (If Applicable). If you need a refill on your cardiac medications before your next appointment, please call your pharmacy.

## 2016-02-06 ENCOUNTER — Telehealth: Payer: Self-pay | Admitting: Cardiology

## 2016-02-06 NOTE — Telephone Encounter (Signed)
Informed patient of results and verbal understanding expressed.  

## 2016-02-06 NOTE — Telephone Encounter (Signed)
-----   Message from Sueanne Margarita, MD sent at 01/28/2016 11:27 PM EDT ----- Heart monitor is normal with rare PAC and PVC

## 2016-02-06 NOTE — Telephone Encounter (Signed)
New Message   Pt returning RN call about results. Please call back to discuss

## 2016-02-14 ENCOUNTER — Other Ambulatory Visit: Payer: Self-pay | Admitting: Family Medicine

## 2016-02-27 ENCOUNTER — Other Ambulatory Visit: Payer: Self-pay | Admitting: Internal Medicine

## 2016-02-27 NOTE — Telephone Encounter (Signed)
Reorder patient Lancets, sent to pharmacy.

## 2016-02-28 ENCOUNTER — Ambulatory Visit: Payer: Commercial Managed Care - HMO | Admitting: Internal Medicine

## 2016-03-18 ENCOUNTER — Other Ambulatory Visit: Payer: Self-pay | Admitting: Family Medicine

## 2016-03-21 NOTE — Telephone Encounter (Signed)
Pt request status of HCTZ refill to [facility]; advised done on 03/19/16. Pt voiced understanding and will contact pharmacy.

## 2016-03-25 ENCOUNTER — Telehealth: Payer: Self-pay | Admitting: Family Medicine

## 2016-03-25 DIAGNOSIS — E785 Hyperlipidemia, unspecified: Secondary | ICD-10-CM

## 2016-03-25 DIAGNOSIS — I10 Essential (primary) hypertension: Secondary | ICD-10-CM

## 2016-03-25 DIAGNOSIS — E1165 Type 2 diabetes mellitus with hyperglycemia: Secondary | ICD-10-CM

## 2016-03-25 NOTE — Telephone Encounter (Signed)
-----   Message from Ellamae Sia sent at 03/20/2016  3:58 PM EDT ----- Regarding: lab orders for Tuesday, 8.8.17 Lab orders for a 3 month follow up appt.

## 2016-03-27 ENCOUNTER — Other Ambulatory Visit (INDEPENDENT_AMBULATORY_CARE_PROVIDER_SITE_OTHER): Payer: Commercial Managed Care - HMO

## 2016-03-27 DIAGNOSIS — I2510 Atherosclerotic heart disease of native coronary artery without angina pectoris: Secondary | ICD-10-CM | POA: Diagnosis not present

## 2016-03-27 DIAGNOSIS — E785 Hyperlipidemia, unspecified: Secondary | ICD-10-CM | POA: Diagnosis not present

## 2016-03-27 DIAGNOSIS — E1165 Type 2 diabetes mellitus with hyperglycemia: Secondary | ICD-10-CM | POA: Diagnosis not present

## 2016-03-27 DIAGNOSIS — I10 Essential (primary) hypertension: Secondary | ICD-10-CM

## 2016-03-27 LAB — COMPREHENSIVE METABOLIC PANEL
ALK PHOS: 64 U/L (ref 39–117)
ALT: 26 U/L (ref 0–35)
AST: 19 U/L (ref 0–37)
Albumin: 4.3 g/dL (ref 3.5–5.2)
BUN: 19 mg/dL (ref 6–23)
CO2: 29 mEq/L (ref 19–32)
CREATININE: 0.96 mg/dL (ref 0.40–1.20)
Calcium: 9.6 mg/dL (ref 8.4–10.5)
Chloride: 98 mEq/L (ref 96–112)
GFR: 60.23 mL/min (ref >60.00)
GLUCOSE: 153 mg/dL — AB (ref 70–99)
POTASSIUM: 3.9 meq/L (ref 3.5–5.1)
SODIUM: 136 meq/L (ref 135–145)
TOTAL PROTEIN: 7.5 g/dL (ref 6.0–8.3)
Total Bilirubin: 0.6 mg/dL (ref 0.2–1.2)

## 2016-03-27 LAB — HEMOGLOBIN A1C: HEMOGLOBIN A1C: 6.8 % — AB (ref 4.6–6.5)

## 2016-03-27 NOTE — Addendum Note (Signed)
Addended by: Marchia Bond on: 03/27/2016 09:07 AM   Modules accepted: Orders

## 2016-03-29 ENCOUNTER — Encounter: Payer: Self-pay | Admitting: Cardiology

## 2016-04-03 ENCOUNTER — Telehealth: Payer: Self-pay | Admitting: Family Medicine

## 2016-04-03 ENCOUNTER — Ambulatory Visit (INDEPENDENT_AMBULATORY_CARE_PROVIDER_SITE_OTHER): Payer: Commercial Managed Care - HMO | Admitting: Family Medicine

## 2016-04-03 ENCOUNTER — Encounter: Payer: Self-pay | Admitting: Family Medicine

## 2016-04-03 VITALS — BP 124/66 | HR 70 | Temp 97.9°F | Ht 61.0 in | Wt 178.0 lb

## 2016-04-03 DIAGNOSIS — E1165 Type 2 diabetes mellitus with hyperglycemia: Secondary | ICD-10-CM

## 2016-04-03 DIAGNOSIS — R300 Dysuria: Secondary | ICD-10-CM | POA: Insufficient documentation

## 2016-04-03 DIAGNOSIS — E669 Obesity, unspecified: Secondary | ICD-10-CM

## 2016-04-03 DIAGNOSIS — E785 Hyperlipidemia, unspecified: Secondary | ICD-10-CM

## 2016-04-03 DIAGNOSIS — I10 Essential (primary) hypertension: Secondary | ICD-10-CM

## 2016-04-03 LAB — POC URINALSYSI DIPSTICK (AUTOMATED)
BILIRUBIN UA: NEGATIVE
Glucose, UA: NEGATIVE
Ketones, UA: NEGATIVE
LEUKOCYTES UA: NEGATIVE
NITRITE UA: NEGATIVE
PH UA: 7.5
PROTEIN UA: NEGATIVE
RBC UA: NEGATIVE
Spec Grav, UA: 1.015
UROBILINOGEN UA: 0.2

## 2016-04-03 MED ORDER — LISINOPRIL 10 MG PO TABS: 10.0000 mg | ORAL_TABLET | Freq: Every day | ORAL | 3 refills | Status: DC

## 2016-04-03 NOTE — Progress Notes (Signed)
Subjective:    Patient ID: Erin Beck, female    DOB: 1941/01/26, 75 y.o.   MRN: [id number]  HPI Here for f/u of chronic health problems   Feeling very good  Much better off metformin -it made her sick and dizzy and achey    Wt Readings from Last 3 Encounters:  04/03/16 178 lb (80.7 kg)  01/12/16 178 lb 12.8 oz (81.1 kg)  01/06/16 176 lb 9.6 oz (80.1 kg)  eating healthy /DM diet Walks when she can with chronic leg/joint problems  bmi is 33.6  Has seen cardiology  dx with CAD without angina /non obst On asa  Tried pravastatin -intol of other statins   bp is stable today  No cp or palpitations or headaches or edema  No side effects to medicines  BP Readings from Last 3 Encounters:  04/03/16 124/66  01/12/16 118/60  01/06/16 122/64      Seeing Dr Cruzita Lederer for DM2 Lab Results  Component Value Date   HGBA1C 6.8 (H) 03/27/2016   This is stable Was intol to metformin  Inc glipizide to 5 mg to am dosing - once in a great while she has low glucose - much better  Getting eye exam in sept    Last lipid Lab Results  Component Value Date   CHOL 223 (H) 06/10/2015   HDL 36.50 (L) 06/10/2015   LDLCALC 127 (H) 03/09/2014   LDLDIRECT 154.0 06/10/2015   TRIG 268.0 (H) 06/10/2015   CHOLHDL 6 06/10/2015  could not tolerate the pravastatin   Having urinary symptoms - burning to urinate  No fever or other symptoms  Wants to give a urine sample  Results for orders placed or performed in visit on 04/03/16  POCT Urinalysis Dipstick (Automated)  Result Value Ref Range   Color, UA yellow    Clarity, UA clear    Glucose, UA neg    Bilirubin, UA neg    Ketones, UA neg    Spec Grav, UA 1.015    Blood, UA neg    pH, UA 7.5    Protein, UA neg    Urobilinogen, UA 0.2    Nitrite, UA neg    Leukocytes, UA Negative Negative     Patient Active Problem List   Diagnosis Date Noted  . Dysuria 04/03/2016  . Abnormal stress test 12/29/2015  . Type 2 diabetes mellitus with  hyperglycemia, without long-term current use of insulin ([place]) 06/17/2015  . Hearing loss of aging 06/14/2015  . 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 Chronic neck and back pain 06/15/2014  . Right shoulder pain 03/12/2014  . Vitreomacular adhesion 05/05/2013  . Unspecified asthma(493.90) 04/23/2013  . Personal history of colonic polyps 03/03/2013  . Post-menopausal 10/17/2012  . Obesity 10/31/2011  . IBS (irritable bowel syndrome) 08/10/2011  . GERD 07/31/2010  . Allergic rhinitis 12/05/2007  . HYPERTENSION, BENIGN ESSENTIAL 02/05/2007  . TOBACCO USE, QUIT 02/05/2007  . Hyperlipidemia 01/31/2007  . ANXIETY 01/31/2007  . OSTEOARTHRITIS 01/31/2007  . DIVERTICULITIS, HX OF 01/31/2007   Past Medical History:  Diagnosis Date  . Allergic rhinitis   . Anxiety   . Arthritis    OA  . Asthma   . Complication of anesthesia    gas bubble rt eye from an old retinal detachment  . Diabetes mellitus    type II  . Diverticulitis    Hx of  . Full dentures   . GERD (gastroesophageal reflux disease)   . Hematuria  HX OF MICROSCOPIC BLOOD IN URINE AND HX OF CYST ON ONE KIDNEY--BUT NO KIDNEY DISEASE  . Hyperlipidemia   . Hypertension   . IBS (irritable bowel syndrome)   . Osteopenia   . Wears glasses    Past Surgical History:  Procedure Laterality Date  . ABDOMINAL HYSTERECTOMY    . APPENDECTOMY     per pt  . CARDIAC CATHETERIZATION N/A 12/29/2015   Procedure: Left Heart Cath and Coronary Angiography;  Surgeon: Belva Crome, MD;  Location: [facility];  Service: Cardiovascular;  Laterality: N/A;  . CARPAL TUNNEL RELEASE     both hands  . CHOLECYSTECTOMY    . COLONOSCOPY    . EYE SURGERY     BILATERAL CATARACTS REMOVED  . EYE SURGERY     gas bubble in rt eye from a retinal detachment  . JOINT REPLACEMENT  2000   LEFT TOTAL KNEE ARTHROPLASTY  . JOINT REPLACEMENT  2013   rt total knee  . KNEE ARTHROSCOPY  1990   chondromalacia   . TOTAL KNEE ARTHROPLASTY  12/05/2011    Procedure: TOTAL KNEE ARTHROPLASTY;  Surgeon: Magnus Sinning, MD;  Location: [facility];  Service: Orthopedics;  Laterality: Right;  . ULNAR NERVE TRANSPOSITION Right 09/15/2014   Procedure: Right cubital tunnel release with transposition of ulnar nerve;  Surgeon: Marianna Payment, MD;  Location: [facility];  Service: Orthopedics;  Laterality: Right;   Social History  Substance Use Topics  . Smoking status: Former Smoker    Packs/day: 1.00    Years: 48.00    Quit date: 08/20/2009  . Smokeless tobacco: Never Used  . Alcohol use No   Family History  Problem Relation Age of Onset  . Pneumonia Mother   . Diabetes Father   . Cancer Father     of the gallbladder  . Heart disease Maternal Grandmother   . Heart disease Paternal Grandmother   . Diabetes Brother    Allergies  Allergen Reactions  . Naproxen Rash    Trouble breathing  . Naproxen Sodium Rash    Trouble breathing  . Aspirin     REACTION: burns stomach PT STATES SHE DOES TOLERATE THE 81 MG ASPIRIN DAILY  . Atorvastatin     REACTION: muscle pain  . Buspar [Buspirone Hcl]     Multiple side eff  . Gabapentin Other (See Comments)    Palpitations/ dizziness   . Metformin And Related Diarrhea  . Norco [Hydrocodone-Acetaminophen] Other (See Comments)    Dizzy and problems remembering   . Pravastatin     Leg pain  . Simvastatin     REACTION: GI side eff and swelling  . Sulfa Antibiotics Other (See Comments)    Severe joint pain  . Sulfonamide Derivatives     REACTION: reaction not known   Current Outpatient Prescriptions on File Prior to Visit  Medication Sig Dispense Refill  . ACCU-CHEK FASTCLIX LANCETS MISC TEST FOUR TIMES DAILY AS DIRECTED 400 each 3  . acetaminophen (TYLENOL) 325 MG tablet Take 2 tablets (650 mg total) by mouth every 6 (six) hours as needed.    Marland Kitchen albuterol (VENTOLIN HFA) 108 (90 Base) MCG/ACT inhaler Inhale 2 puffs into the lungs every 6 (six) hours as needed for wheezing or shortness  of breath. Reported on 12/30/2015    . aspirin 81 MG tablet Take 81 mg by mouth daily.    . cetirizine (ZYRTEC) 10 MG tablet Take 10 mg by mouth daily as needed  for allergies.    Marland Kitchen glipiZIDE (GLUCOTROL XL) 2.5 MG 24 hr tablet Take 2 tablets (5 mg total) by mouth daily before breakfast. 180 tablet 3  . glucose blood test strip 1 each 2 (two) times daily. Check blood sugar twice daily and as needed.    . hydrochlorothiazide (HYDRODIURIL) 25 MG tablet TAKE 1 TABLET EVERY DAY 90 tablet 0  . nitroGLYCERIN (NITROSTAT) 0.4 MG SL tablet Place 1 tablet (0.4 mg total) under the tongue every 5 (five) minutes as needed for chest pain. 25 tablet 3  . omeprazole (PRILOSEC) 20 MG capsule TAKE 1 CAPSULE EVERY DAY 90 capsule 0  . pravastatin (PRAVACHOL) 20 MG tablet Take 1 tablet (20 mg total) by mouth every evening. 30 tablet 1   No current facility-administered medications on file prior to visit.     Review of Systems Review of Systems  Constitutional: Negative for fever, appetite change, fatigue and unexpected weight change.  Eyes: Negative for pain and visual disturbance.  Respiratory: Negative for cough and shortness of breath.   Cardiovascular: Negative for cp or palpitations    Gastrointestinal: Negative for nausea, diarrhea and constipation.  Genitourinary: Negative for urgency and frequency. pos for dysuria today w/o urine odor or blood or bladder pain  Skin: Negative for pallor or rash   Neurological: Negative for weakness, light-headedness, numbness and headaches.  Hematological: Negative for adenopathy. Does not bruise/bleed easily.  Psychiatric/Behavioral: Negative for dysphoric mood. The patient is not nervous/anxious.         Objective:   Physical Exam  Constitutional: She appears well-developed and well-nourished. No distress.  obese and well appearing   HENT:  Head: Normocephalic and atraumatic.  Mouth/Throat: Oropharynx is clear and moist.  Eyes: Conjunctivae and EOM are normal.  Pupils are equal, round, and reactive to light.  Neck: Normal range of motion. Neck supple. No JVD present. Carotid bruit is not present. No thyromegaly present.  Cardiovascular: Normal rate, regular rhythm, normal heart sounds and intact distal pulses.  Exam reveals no gallop.   Pulmonary/Chest: Effort normal and breath sounds normal. No respiratory distress. She has no wheezes. She has no rales.  No crackles  Abdominal: Soft. Bowel sounds are normal. She exhibits no distension, no abdominal bruit and no mass. There is no tenderness.  Musculoskeletal: She exhibits no edema.  Lymphadenopathy:    She has no cervical adenopathy.  Neurological: She is alert. She has normal reflexes.  Skin: Skin is warm and dry. No rash noted. No pallor.  Bump with bruise on L leg from prev injury  Psychiatric: She has a normal mood and affect.          Assessment & Plan:   Problem List Items Addressed This Visit      Cardiovascular and Mediastinum   HYPERTENSION, BENIGN ESSENTIAL - Primary    bp in fair control at this time  BP Readings from Last 1 Encounters:  04/03/16 124/66   No changes needed Disc lifstyle change with low sodium diet and exercise  Labs reviewed  Enc wt loss       Relevant Medications   lisinopril (PRINIVIL,ZESTRIL) 10 MG tablet     Endocrine   Type 2 diabetes mellitus with hyperglycemia, without long-term current use of insulin (H[facility])    Improved Lab Results  Component Value Date   HGBA1C 6.8 (H) 03/27/2016   Will f/u with endocrinologist  Feels better off metformin      Relevant Medications   lisinopril (PRINIVIL,ZESTRIL) 10 MG tablet  Other   Obesity    Discussed how this problem influences overall health and the risks it imposes  Reviewed plan for weight loss with lower calorie diet (via better food choices and also portion control or program like weight watchers) and exercise building up to or more than 30 minutes 5 days per week including some aerobic  activity         Hyperlipidemia    Intol to pravastatin as well as other statins Hx of CAD-non obst  Enc low trans fat diet  She will d/w cardiologist as well      Relevant Medications   lisinopril (PRINIVIL,ZESTRIL) 10 MG tablet   Dysuria    ua neg Urged to drink more water-less caffeine and avoid acidic bev and artificial sweetener        Relevant Orders   POCT Urinalysis Dipstick (Automated) (Completed)    Other Visit Diagnoses   None.

## 2016-04-03 NOTE — Progress Notes (Signed)
Pre visit review using our clinic review tool, if applicable. No additional management support is needed unless otherwise documented below in the visit note. 

## 2016-04-03 NOTE — Telephone Encounter (Signed)
See lab results.  

## 2016-04-03 NOTE — Patient Instructions (Signed)
Since you cannot take pravastatin - Avoid red meat/ fried foods/ egg yolks/ fatty breakfast meats/ butter, cheese and high fat dairy/ and shellfish   See Dr Herbert Deaner for eye exam in Sept as planned See Dr Cruzita Lederer as planned - A1C is stable  Work on healthy diet and weight loss

## 2016-04-03 NOTE — Assessment & Plan Note (Signed)
Intol to pravastatin as well as other statins Hx of CAD-non obst  Enc low trans fat diet  She will d/w cardiologist as well

## 2016-04-03 NOTE — Assessment & Plan Note (Signed)
ua neg Urged to drink more water-less caffeine and avoid acidic bev and artificial sweetener

## 2016-04-03 NOTE — Assessment & Plan Note (Signed)
bp in fair control at this time  BP Readings from Last 1 Encounters:  04/03/16 124/66   No changes needed Disc lifstyle change with low sodium diet and exercise  Labs reviewed  Enc wt loss

## 2016-04-03 NOTE — Assessment & Plan Note (Signed)
Improved Lab Results  Component Value Date   HGBA1C 6.8 (H) 03/27/2016   Will f/u with endocrinologist  Feels better off metformin

## 2016-04-03 NOTE — Telephone Encounter (Signed)
Patient called and I let her know the ua was negative.

## 2016-04-03 NOTE — Assessment & Plan Note (Signed)
Discussed how this problem influences overall health and the risks it imposes  Reviewed plan for weight loss with lower calorie diet (via better food choices and also portion control or program like weight watchers) and exercise building up to or more than 30 minutes 5 days per week including some aerobic activity    

## 2016-04-06 ENCOUNTER — Encounter: Payer: Self-pay | Admitting: Internal Medicine

## 2016-04-06 ENCOUNTER — Ambulatory Visit (INDEPENDENT_AMBULATORY_CARE_PROVIDER_SITE_OTHER): Payer: Commercial Managed Care - HMO | Admitting: Internal Medicine

## 2016-04-06 VITALS — BP 122/62 | HR 68 | Ht 61.5 in | Wt 178.0 lb

## 2016-04-06 DIAGNOSIS — E1159 Type 2 diabetes mellitus with other circulatory complications: Secondary | ICD-10-CM

## 2016-04-06 MED ORDER — GLIPIZIDE ER 5 MG PO TB24: 5.0000 mg | ORAL_TABLET | Freq: Every day | ORAL | 3 refills | Status: DC

## 2016-04-06 NOTE — Progress Notes (Signed)
Patient ID: Erin Beck, female   DOB: June 16, 1941, 75 y.o.   MRN: [id number]  HPI: Erin Beck is a 75 y.o.-year-old female, returning for f/u for DM2, non-insulin-dependent, dx 2005, uncontrolled, with complications (CAD). Last visit 3 mo ago.  She saw Dr Radford Pax (cardiology) before last visit >> event monitor and then chemical stress test >> positive >> catheterization >> CAD, but no stent indicated.  Last hemoglobin A1c was: Lab Results  Component Value Date   HGBA1C 6.8 (H) 03/27/2016   HGBA1C 6.4 01/06/2016   HGBA1C 6.0 10/18/2015  Labs from Dr. Erlinda Hong [facility], 08/30/2014): HbA1c 6.9%, decreased from 8.0%  Pt is on a regimen of: - Glipizide XL 5 >> 2.5 >> 5 mg in am Stopped Metformin ER 1500 mg in pm before cath but she was not feeling well on it (diarrhea, weakness, thirst, leg swelling, blurry vision) She has not been on insulin before, would not like to stick herself.  Pt checks her sugars 2-3 a day - slightly higher after stopping Metformin: - am: 170-180 >> 111-154 >> 90-127 >> 100-112 >> 91-140 >> 101-130 >> 94-120 >> 80-119 >> 90-153 >> 92-143 - 2h after b'fast: n/c >> 242, 273 >> 150s >> n/c >> 136-252 >> 89, 184, 231 >> 127-152 >> 99-144 >> 112-140 >> 111-158 - before lunch: n/c >> 75-120 >> 118-154, 189, 209 >> 97-152 >> 79, 87-136 >> 87-127 >> 107-138, 151 >>73,  90-138 - 2h after lunch: n/c >> 200-210 (?) >> 115-200 >> 121-146 >> 114-150 >> 91-149, 163 >> 117-147 >> 74, 80-152, 172 - before dinner:100-130 >> 80, 95-143 >> 82-132 >> 81-102 >> 91-137, 151, 171 >> 108-134, 182 >> 86-135 - 2h after dinner: 150-160 >> 120-150 >> 123-194 >> 103-190 >> 121-177 >> 95-162 >> 130-182, 236 >> 96-161 - bedtime: 120-140 >> 100, 120-140 >> 105-180 >> 81, 84-122 >> 80-133 >> 80-126 >> 123-157 >> 106-148, 157 - nighttime: n/c >> 80 x1 >> 98-105 >> 91-132 >> n/c >> 88-115 >> 83-111 >> 75s (not in last mo) >> 115-164 >> 134 No lows. Lowest sugar was 80s >> feels them >> 75 (at night)  >> 90 >> 74;  she has hypoglycemia awareness at 80.  Highest sugar was 220 >> 160 >> 210 >> 252 >> 231 >> 177 >> 171 >> 236x1 >> 170s  Pt's meals are: - Breakfast: cereals - Lunch: salads, 1 sandwich, leftovers - Dinner: salmon/chicken, vegetables - Snacks: 1 fruit, crackers - not every day Cut out soft drinks.  - no CKD, last BUN/creatinine:  Lab Results  Component Value Date   BUN 19 03/27/2016   CREATININE 0.96 03/27/2016  On Lisinopril. - last set of lipids: Lab Results  Component Value Date   CHOL 223 (H) 06/10/2015   HDL 36.50 (L) 06/10/2015   LDLCALC 127 (H) 03/09/2014   LDLDIRECT 154.0 06/10/2015   TRIG 268.0 (H) 06/10/2015   CHOLHDL 6 06/10/2015  She was on a statin >> AP.  - last eye exam was in 07/2014. No DR. Had cataracts sx. Has an appt coming up ion 04/2016. - no numbness and tingling in her feet.  I reviewed pt's medications, allergies, PMH, social hx, family hx, and changes were documented in the history of present illness. Otherwise, unchanged from my initial visit note.  ROS: Constitutional: no weight gain, no fatigue, no subjective hyperthermia/hypothermia Eyes: no blurry vision, no xerophthalmia ENT: no sore throat, no nodules palpated in throat, no dysphagia/odynophagia, no hoarseness, + decreased hearing  Cardiovascular: no CP/SOB/palpitations/leg swelling Respiratory: no cough/SOB Gastrointestinal: no N/V/D/C Musculoskeletal: no muscle/joint aches Skin: no rashes Neurological: no tremors/numbness/tingling/dizziness  PE: BP 122/62 (BP Location: Left Arm, Patient Position: Sitting)   Pulse 68   Ht 5' 1.5" (1.562 m)   Wt 178 lb (80.7 kg)   BMI 33.09 kg/m  Body mass index is 33.09 kg/m. Wt Readings from Last 3 Encounters:  04/06/16 178 lb (80.7 kg)  04/03/16 178 lb (80.7 kg)  01/12/16 178 lb 12.8 oz (81.1 kg)   Constitutional: overweight, in NAD Eyes: PERRLA, EOMI, no exophthalmos ENT: moist mucous membranes, no thyromegaly, no cervical  lymphadenopathy Cardiovascular: RRR, No MRG Respiratory: CTA B Gastrointestinal: abdomen soft, NT, ND, BS+ Musculoskeletal: no deformities, strength intact in all 4 Skin: moist, warm, no rashes Neurological: no tremor with outstretched hands, DTR normal in all 4  ASSESSMENT: 1. DM2, non-insulin-dependent, uncontrolled, with complications - CAD  PLAN:  1. Patient with long-standing, well controlled diabetes, on oral antidiabetic regimen, with good control. She stopped Metformin b/f her cath and she felt much better w/o it >> stayed off afterwards. To compensate, she increased the Glipizide XL back to 5 mg. Sugars are a little higher, but still at goal. No lows. Reviewed last HbA1>> 6.8% (higher, but still at goal). Will continue with only Glipizide XL. - I suggested to:  Patient Instructions  Please continue - Glipizide XL 5 mg in am.  Please return in 3 months with your sugar log.   - continue checking sugars at different times of the day - check 1-2 times a day, rotating checks - advised for yearly eye exams >> she needs a new one >> scheduled for next mo - reviewed together the latest LDL level >> high. She is statin intolerant. Per cardiology. - Return to clinic in 3 mo with sugar log   Philemon Kingdom, MD PhD [facility] Endocr[facility]

## 2016-04-06 NOTE — Patient Instructions (Signed)
Patient Instructions  Please continue - Glipizide XL 5 mg in am.  Please return in 3 months with your sugar log.

## 2016-04-11 ENCOUNTER — Other Ambulatory Visit: Payer: Commercial Managed Care - HMO

## 2016-04-12 ENCOUNTER — Ambulatory Visit: Payer: Commercial Managed Care - HMO | Admitting: Cardiology

## 2016-04-18 ENCOUNTER — Other Ambulatory Visit: Payer: Self-pay | Admitting: Family Medicine

## 2016-04-19 ENCOUNTER — Ambulatory Visit (INDEPENDENT_AMBULATORY_CARE_PROVIDER_SITE_OTHER): Payer: Commercial Managed Care - HMO | Admitting: Internal Medicine

## 2016-04-19 ENCOUNTER — Encounter: Payer: Self-pay | Admitting: Internal Medicine

## 2016-04-19 VITALS — BP 118/66 | HR 56 | Temp 97.7°F | Wt 176.8 lb

## 2016-04-19 DIAGNOSIS — R350 Frequency of micturition: Secondary | ICD-10-CM | POA: Diagnosis not present

## 2016-04-19 DIAGNOSIS — R300 Dysuria: Secondary | ICD-10-CM

## 2016-04-19 LAB — POC URINALSYSI DIPSTICK (AUTOMATED)
Bilirubin, UA: NEGATIVE
Blood, UA: NEGATIVE
Glucose, UA: NEGATIVE
Ketones, UA: NEGATIVE
NITRITE UA: NEGATIVE
PROTEIN UA: NEGATIVE
Spec Grav, UA: 1.015
UROBILINOGEN UA: 0.2
pH, UA: 6.5

## 2016-04-19 MED ORDER — CIPROFLOXACIN HCL 250 MG PO TABS: 250.0000 mg | ORAL_TABLET | Freq: Two times a day (BID) | ORAL | 0 refills | Status: DC

## 2016-04-19 NOTE — Patient Instructions (Signed)

## 2016-04-19 NOTE — Progress Notes (Signed)
HPI  Pt presents to the clinic today with c/o urinary frequency and dysuria. This started 2-3 days ago but worsened overnight. She reports associated bladder pressure, chills and sweats. She denies fever, nausea, or low back pain. She has decreased her caffeine intake and started drinking more water. She reports she has had UTI's in the past and this feels the same. She denies vaginal complaints.   Review of Systems  Past Medical History:  Diagnosis Date  . Allergic rhinitis   . Anxiety   . Arthritis    OA  . Asthma   . Complication of anesthesia    gas bubble rt eye from an old retinal detachment  . Diabetes mellitus    type II  . Diverticulitis    Hx of  . Full dentures   . GERD (gastroesophageal reflux disease)   . Hematuria    HX OF MICROSCOPIC BLOOD IN URINE AND HX OF CYST ON ONE KIDNEY--BUT NO KIDNEY DISEASE  . Hyperlipidemia   . Hypertension   . IBS (irritable bowel syndrome)   . Osteopenia   . Wears glasses     Family History  Problem Relation Age of Onset  . Pneumonia Mother   . Diabetes Father   . Cancer Father     of the gallbladder  . Heart disease Maternal Grandmother   . Heart disease Paternal Grandmother   . Diabetes Brother     Social History   Social History  . Marital status: Married    Spouse name: N/A  . Number of children: N/A  . Years of education: N/A   Occupational History  . Not on file.   Social History Main Topics  . Smoking status: Former Smoker    Packs/day: 1.00    Years: 48.00    Quit date: 08/20/2009  . Smokeless tobacco: Never Used  . Alcohol use No  . Drug use: No  . Sexual activity: Not on file   Other Topics Concern  . Not on file   Social History Narrative  . No narrative on file    Allergies  Allergen Reactions  . Naproxen Rash    Trouble breathing  . Naproxen Sodium Rash    Trouble breathing  . Aspirin     REACTION: burns stomach PT STATES SHE DOES TOLERATE THE 81 MG ASPIRIN DAILY  . Atorvastatin    REACTION: muscle pain  . Buspar [Buspirone Hcl]     Multiple side eff  . Gabapentin Other (See Comments)    Palpitations/ dizziness   . Metformin And Related     Mm aches, palpitations, SOB, worse eyesight, loss of balance, sweating, low CBGs  . Norco [Hydrocodone-Acetaminophen] Other (See Comments)    Dizzy and problems remembering   . Pravastatin     Leg pain  . Simvastatin     REACTION: GI side eff and swelling  . Sulfa Antibiotics Other (See Comments)    Severe joint pain  . Sulfonamide Derivatives     REACTION: reaction not known    Constitutional: Denies fever, malaise, fatigue, headache or abrupt weight changes.   GU: Pt reports frequency and pain with urination. Denies urgency, burning sensation, blood in urine, odor or discharge. Skin: Denies redness, rashes, lesions or ulcercations.   No other specific complaints in a complete review of systems (except as listed in HPI above).    Objective:   Physical Exam  BP 118/66   Pulse (!) 56   Temp 97.7 F (36.5 C) (Oral)  Wt 176 lb 12 oz (80.2 kg)   SpO2 96%   BMI 32.86 kg/m  Wt Readings from Last 3 Encounters:  04/19/16 176 lb 12 oz (80.2 kg)  04/06/16 178 lb (80.7 kg)  04/03/16 178 lb (80.7 kg)    General: Appears her stated age, in NAD.  Pulmonary/Chest: Normal effort and positive vesicular breath sounds. No respiratory distress. No wheezes, rales or ronchi noted.  Abdomen: Soft. Normal bowel sounds. No distention or masses noted.  Tender to palpation over the bladder area and bilateral lower abdomen. Left side CVA tenderness.      Assessment & Plan:   Frequency, Dysuria secondary to   Urinalysis: 1+ leuks Will send urine culture eRx sent if for Cipro 250 mg BID x 5 days OK to take AZO OTC Drink plenty of fluids  RTC as needed or if symptoms persist. Webb Silversmith, NP

## 2016-04-19 NOTE — Addendum Note (Signed)
Addended by: Lurlean Nanny on: 04/19/2016 04:43 PM   Modules accepted: Orders

## 2016-04-21 LAB — URINE CULTURE: Organism ID, Bacteria: NO GROWTH

## 2016-04-30 ENCOUNTER — Encounter: Payer: Self-pay | Admitting: Family Medicine

## 2016-04-30 ENCOUNTER — Ambulatory Visit (INDEPENDENT_AMBULATORY_CARE_PROVIDER_SITE_OTHER): Payer: Commercial Managed Care - HMO | Admitting: Family Medicine

## 2016-04-30 VITALS — BP 128/60 | HR 69 | Temp 97.8°F | Ht 61.5 in | Wt 174.5 lb

## 2016-04-30 DIAGNOSIS — D473 Essential (hemorrhagic) thrombocythemia: Secondary | ICD-10-CM

## 2016-04-30 DIAGNOSIS — R42 Dizziness and giddiness: Secondary | ICD-10-CM | POA: Diagnosis not present

## 2016-04-30 DIAGNOSIS — R1032 Left lower quadrant pain: Secondary | ICD-10-CM | POA: Diagnosis not present

## 2016-04-30 DIAGNOSIS — D75839 Thrombocytosis, unspecified: Secondary | ICD-10-CM

## 2016-04-30 LAB — GLUCOSE, POCT (MANUAL RESULT ENTRY): POC Glucose: 123 mg/dl — AB (ref 70–99)

## 2016-04-30 MED ORDER — CIPROFLOXACIN HCL 250 MG PO TABS: 250.0000 mg | ORAL_TABLET | Freq: Two times a day (BID) | ORAL | 0 refills | Status: DC

## 2016-04-30 MED ORDER — METRONIDAZOLE 500 MG PO TABS: 500.0000 mg | ORAL_TABLET | Freq: Three times a day (TID) | ORAL | 0 refills | Status: DC

## 2016-04-30 NOTE — Progress Notes (Signed)
Pre visit review using our clinic review tool, if applicable. No additional management support is needed unless otherwise documented below in the visit note. 

## 2016-04-30 NOTE — Progress Notes (Signed)
Subjective:    Patient ID: Erin Beck, female    DOB: June 14, 1941, 75 y.o.   MRN: [id number]  HPI Here for several acute issues   Took cipro for a uti - felt draggy but urinary symptoms improved   L low abdominal pain however got worse - side and abdomen  Pain was pretty bad    Her stool had mucous in it  Then bm was lighter in color with darker brown spots  Then some fresh red blood upon wiping  ? Diverticulitis  Stool is not loose or watery  No fever (feels cold and clammy) Was a bit nauseated at times  Also feeling a little dizzy earlier today when she got up No hypoglycemia      Wt Readings from Last 3 Encounters:  04/30/16 174 lb 8 oz (79.2 kg)  04/19/16 176 lb 12 oz (80.2 kg)  04/06/16 178 lb (80.7 kg)   bmi is 32.4  Has been dizzy Glucose is 123 on arrival today  Patient Active Problem List   Diagnosis Date Noted  . LLQ abdominal pain 04/30/2016  . Dysuria 04/03/2016  . Abnormal stress test 12/29/2015  . Type 2 diabetes mellitus with circulatory disorder, without long-term current use of insulin ([place]) 06/17/2015  . Hearing loss of aging 06/14/2015  . 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 Chronic neck and back pain 06/15/2014  . Right shoulder pain 03/12/2014  . Vitreomacular adhesion 05/05/2013  . Unspecified asthma(493.90) 04/23/2013  . Personal history of colonic polyps 03/03/2013  . Post-menopausal 10/17/2012  . Obesity 10/31/2011  . IBS (irritable bowel syndrome) 08/10/2011  . GERD 07/31/2010  . Allergic rhinitis 12/05/2007  . HYPERTENSION, BENIGN ESSENTIAL 02/05/2007  . TOBACCO USE, QUIT 02/05/2007  . Hyperlipidemia 01/31/2007  . ANXIETY 01/31/2007  . OSTEOARTHRITIS 01/31/2007  . DIVERTICULITIS, HX OF 01/31/2007   Past Medical History:  Diagnosis Date  . Allergic rhinitis   . Anxiety   . Arthritis    OA  . Asthma   . Complication of anesthesia    gas bubble rt eye from an old retinal detachment  . Diabetes mellitus    type II    . Diverticulitis    Hx of  . Full dentures   . GERD (gastroesophageal reflux disease)   . Hematuria    HX OF MICROSCOPIC BLOOD IN URINE AND HX OF CYST ON ONE KIDNEY--BUT NO KIDNEY DISEASE  . Hyperlipidemia   . Hypertension   . IBS (irritable bowel syndrome)   . Osteopenia   . Wears glasses    Past Surgical History:  Procedure Laterality Date  . ABDOMINAL HYSTERECTOMY    . APPENDECTOMY     per pt  . CARDIAC CATHETERIZATION N/A 12/29/2015   Procedure: Left Heart Cath and Coronary Angiography;  Surgeon: Belva Crome, MD;  Location: [facility];  Service: Cardiovascular;  Laterality: N/A;  . CARPAL TUNNEL RELEASE     both hands  . CHOLECYSTECTOMY    . COLONOSCOPY    . EYE SURGERY     BILATERAL CATARACTS REMOVED  . EYE SURGERY     gas bubble in rt eye from a retinal detachment  . JOINT REPLACEMENT  2000   LEFT TOTAL KNEE ARTHROPLASTY  . JOINT REPLACEMENT  2013   rt total knee  . KNEE ARTHROSCOPY  1990   chondromalacia   . TOTAL KNEE ARTHROPLASTY  12/05/2011   Procedure: TOTAL KNEE ARTHROPLASTY;  Surgeon: Magnus Sinning, MD;  Location: [facility];  Service: Orthopedics;  Laterality: Right;  . ULNAR NERVE TRANSPOSITION Right 09/15/2014   Procedure: Right cubital tunnel release with transposition of ulnar nerve;  Surgeon: Marianna Payment, MD;  Location: [facility];  Service: Orthopedics;  Laterality: Right;   Social History  Substance Use Topics  . Smoking status: Former Smoker    Packs/day: 1.00    Years: 48.00    Quit date: 08/20/2009  . Smokeless tobacco: Never Used  . Alcohol use No   Family History  Problem Relation Age of Onset  . Pneumonia Mother   . Diabetes Father   . Cancer Father     of the gallbladder  . Heart disease Maternal Grandmother   . Heart disease Paternal Grandmother   . Diabetes Brother    Allergies  Allergen Reactions  . Naproxen Rash    Trouble breathing  . Naproxen Sodium Rash    Trouble breathing  . Aspirin      REACTION: burns stomach PT STATES SHE DOES TOLERATE THE 81 MG ASPIRIN DAILY  . Atorvastatin     REACTION: muscle pain  . Buspar [Buspirone Hcl]     Multiple side eff  . Gabapentin Other (See Comments)    Palpitations/ dizziness   . Metformin And Related     Mm aches, palpitations, SOB, worse eyesight, loss of balance, sweating, low CBGs  . Norco [Hydrocodone-Acetaminophen] Other (See Comments)    Dizzy and problems remembering   . Pravastatin     Leg pain  . Simvastatin     REACTION: GI side eff and swelling  . Sulfa Antibiotics Other (See Comments)    Severe joint pain  . Sulfonamide Derivatives     REACTION: reaction not known   Current Outpatient Prescriptions on File Prior to Visit  Medication Sig Dispense Refill  . ACCU-CHEK FASTCLIX LANCETS MISC TEST FOUR TIMES DAILY AS DIRECTED 400 each 3  . acetaminophen (TYLENOL) 325 MG tablet Take 2 tablets (650 mg total) by mouth every 6 (six) hours as needed.    Marland Kitchen albuterol (VENTOLIN HFA) 108 (90 Base) MCG/ACT inhaler Inhale 2 puffs into the lungs every 6 (six) hours as needed for wheezing or shortness of breath. Reported on 12/30/2015    . aspirin 81 MG tablet Take 81 mg by mouth daily.    . cetirizine (ZYRTEC) 10 MG tablet Take 10 mg by mouth daily as needed for allergies.    Marland Kitchen glipiZIDE (GLIPIZIDE XL) 5 MG 24 hr tablet Take 1 tablet (5 mg total) by mouth daily with breakfast. 90 tablet 3  . glucose blood test strip 1 each 2 (two) times daily. Check blood sugar twice daily and as needed.    . hydrochlorothiazide (HYDRODIURIL) 25 MG tablet TAKE 1 TABLET EVERY DAY 90 tablet 0  . lisinopril (PRINIVIL,ZESTRIL) 10 MG tablet Take 1 tablet (10 mg total) by mouth daily. 90 tablet 3  . nitroGLYCERIN (NITROSTAT) 0.4 MG SL tablet Place 1 tablet (0.4 mg total) under the tongue every 5 (five) minutes as needed for chest pain. 25 tablet 3  . omeprazole (PRILOSEC) 20 MG capsule TAKE 1 CAPSULE EVERY DAY 90 capsule 1   No current facility-administered  medications on file prior to visit.     Review of Systems Review of Systems  Constitutional: Negative for fever/ pos for malaise and poor appetite  Eyes: Negative for pain and visual disturbance.  Respiratory: Negative for cough and shortness of breath.   Cardiovascular: Negative for cp or palpitations  Gastrointestinal: Negative for nausea, diarrhea and constipation. pos for blood in stool, pos for LLQ abd pain  Genitourinary: Negative for urgency and frequency. neg for hematuria  Skin: Negative for pallor or rash   Neurological: Negative for weakness, numbness and headaches. pos for intermittent dizziness Hematological: Negative for adenopathy. Does not bruise/bleed easily.  Psychiatric/Behavioral: Negative for dysphoric mood. The patient is not nervous/anxious.         Objective:   Physical Exam  Constitutional: She appears well-developed and well-nourished. No distress.  obese and well appearing  Seems mildly fatigued  HENT:  Head: Normocephalic and atraumatic.  Mouth/Throat: Oropharynx is clear and moist.  MMM  Eyes: Conjunctivae and EOM are normal. Pupils are equal, round, and reactive to light. No scleral icterus.  Neck: Normal range of motion. Neck supple.  Cardiovascular: Normal rate, regular rhythm and normal heart sounds.   Pulmonary/Chest: Effort normal and breath sounds normal. No respiratory distress. She has no wheezes. She has no rales.  Abdominal: Soft. Bowel sounds are normal. She exhibits no distension and no mass. There is no hepatosplenomegaly. There is tenderness in the suprapubic area and left lower quadrant. There is no rebound, no guarding, no CVA tenderness, no tenderness at McBurney's point and negative Murphy's sign.  Musculoskeletal: She exhibits no edema.  Lymphadenopathy:    She has no cervical adenopathy.  Neurological: She is alert. No cranial nerve deficit. She exhibits normal muscle tone. Coordination normal.  Skin: Skin is warm and dry. No rash  noted. No erythema. No pallor.  No jaundice   Psychiatric: She has a normal mood and affect.          Assessment & Plan:   Problem List Items Addressed This Visit      Other   LLQ abdominal pain - Primary    With dec appetite/malaise and stool change Strongly suspect bout of diverticulitis Cbc today Low threshold for CT if no imp  Start cipro and flagyl Align for probiotic Bland diet Update if not starting to improve in a week or if worsening  (will go to ED for worse abd pain)      Relevant Orders   CBC with Differential/Platelet    Other Visit Diagnoses    Dizziness       Relevant Orders   Glucose (CBG) (Completed)

## 2016-04-30 NOTE — Assessment & Plan Note (Signed)
With dec appetite/malaise and stool change Strongly suspect bout of diverticulitis Cbc today Low threshold for CT if no imp  Start cipro and flagyl Align for probiotic Bland diet Update if not starting to improve in a week or if worsening  (will go to ED for worse abd pain)

## 2016-04-30 NOTE — Patient Instructions (Signed)
You may have a bout of diverticulitis Take the cipro and flagyl as directed with a little food  Eat a bland diet  Keep drinking fluids Get Align over the counter- a probiotic to help your gut restore normal flora during an antibiotic Labs today also to check your blood count Update if not starting to improve in a week or if worsening   Go to the ER if after hours if abdominal pain worsens

## 2016-05-01 LAB — CBC WITH DIFFERENTIAL/PLATELET
BASOS PCT: 0.1 % (ref 0.0–3.0)
Basophils Absolute: 0 10*3/uL (ref 0.0–0.1)
EOS PCT: 3.1 % (ref 0.0–5.0)
Eosinophils Absolute: 0.3 10*3/uL (ref 0.0–0.7)
HEMATOCRIT: 39.5 % (ref 36.0–46.0)
HEMOGLOBIN: 13.4 g/dL (ref 12.0–15.0)
LYMPHS PCT: 15.7 % (ref 12.0–46.0)
Lymphs Abs: 1.4 10*3/uL (ref 0.7–4.0)
MCHC: 34 g/dL (ref 30.0–36.0)
MCV: 89.1 fl (ref 78.0–100.0)
Monocytes Absolute: 0.3 10*3/uL (ref 0.1–1.0)
Monocytes Relative: 3.3 % (ref 3.0–12.0)
Neutro Abs: 7.1 10*3/uL (ref 1.4–7.7)
Neutrophils Relative %: 77.8 % — ABNORMAL HIGH (ref 43.0–77.0)
Platelets: 730 10*3/uL — ABNORMAL HIGH (ref 150.0–400.0)
RBC: 4.43 Mil/uL (ref 3.87–5.11)
RDW: 13 % (ref 11.5–15.5)
WBC: 9.2 10*3/uL (ref 4.0–10.5)

## 2016-05-02 DIAGNOSIS — D473 Essential (hemorrhagic) thrombocythemia: Secondary | ICD-10-CM | POA: Diagnosis not present

## 2016-05-02 DIAGNOSIS — R42 Dizziness and giddiness: Secondary | ICD-10-CM | POA: Diagnosis not present

## 2016-05-02 DIAGNOSIS — R1032 Left lower quadrant pain: Secondary | ICD-10-CM | POA: Diagnosis not present

## 2016-05-02 NOTE — Addendum Note (Signed)
Addended by: Ellamae Sia on: 05/02/2016 10:14 AM   Modules accepted: Orders

## 2016-05-03 LAB — PATHOLOGIST SMEAR REVIEW

## 2016-05-08 ENCOUNTER — Other Ambulatory Visit: Payer: Self-pay | Admitting: Family Medicine

## 2016-05-08 DIAGNOSIS — Z1231 Encounter for screening mammogram for malignant neoplasm of breast: Secondary | ICD-10-CM

## 2016-05-09 ENCOUNTER — Other Ambulatory Visit: Payer: Self-pay | Admitting: Internal Medicine

## 2016-05-09 ENCOUNTER — Ambulatory Visit (INDEPENDENT_AMBULATORY_CARE_PROVIDER_SITE_OTHER): Payer: Commercial Managed Care - HMO

## 2016-05-09 ENCOUNTER — Other Ambulatory Visit (INDEPENDENT_AMBULATORY_CARE_PROVIDER_SITE_OTHER): Payer: Commercial Managed Care - HMO

## 2016-05-09 DIAGNOSIS — D473 Essential (hemorrhagic) thrombocythemia: Secondary | ICD-10-CM | POA: Diagnosis not present

## 2016-05-09 DIAGNOSIS — Z23 Encounter for immunization: Secondary | ICD-10-CM | POA: Diagnosis not present

## 2016-05-09 DIAGNOSIS — I2510 Atherosclerotic heart disease of native coronary artery without angina pectoris: Secondary | ICD-10-CM

## 2016-05-09 DIAGNOSIS — D75839 Thrombocytosis, unspecified: Secondary | ICD-10-CM

## 2016-05-09 DIAGNOSIS — E785 Hyperlipidemia, unspecified: Secondary | ICD-10-CM

## 2016-05-09 LAB — CBC WITH DIFFERENTIAL/PLATELET
BASOS ABS: 0 10*3/uL (ref 0.0–0.1)
Basophils Relative: 0.4 % (ref 0.0–3.0)
EOS ABS: 0.2 10*3/uL (ref 0.0–0.7)
Eosinophils Relative: 1.7 % (ref 0.0–5.0)
HEMATOCRIT: 41.6 % (ref 36.0–46.0)
HEMOGLOBIN: 14.1 g/dL (ref 12.0–15.0)
LYMPHS PCT: 15.5 % (ref 12.0–46.0)
Lymphs Abs: 1.8 10*3/uL (ref 0.7–4.0)
MCHC: 33.8 g/dL (ref 30.0–36.0)
MCV: 89.5 fl (ref 78.0–100.0)
MONOS PCT: 5.3 % (ref 3.0–12.0)
Monocytes Absolute: 0.6 10*3/uL (ref 0.1–1.0)
Neutro Abs: 9 10*3/uL — ABNORMAL HIGH (ref 1.4–7.7)
Neutrophils Relative %: 77.1 % — ABNORMAL HIGH (ref 43.0–77.0)
PLATELETS: 538 10*3/uL — AB (ref 150.0–400.0)
RBC: 4.65 Mil/uL (ref 3.87–5.11)
RDW: 13.4 % (ref 11.5–15.5)
WBC: 11.7 10*3/uL — AB (ref 4.0–10.5)

## 2016-05-09 NOTE — Addendum Note (Signed)
Addended by: Ellamae Sia on: 05/09/2016 02:48 PM   Modules accepted: Orders

## 2016-05-11 ENCOUNTER — Ambulatory Visit
Admission: RE | Admit: 2016-05-11 | Discharge: 2016-05-11 | Disposition: A | Discharge status: Home / Self Care | Payer: Commercial Managed Care - HMO | Source: Ambulatory Visit | Attending: Family Medicine | Admitting: Family Medicine

## 2016-05-11 DIAGNOSIS — Z1231 Encounter for screening mammogram for malignant neoplasm of breast: Secondary | ICD-10-CM | POA: Diagnosis not present

## 2016-05-11 LAB — HM MAMMOGRAPHY

## 2016-05-14 DIAGNOSIS — E119 Type 2 diabetes mellitus without complications: Secondary | ICD-10-CM | POA: Diagnosis not present

## 2016-05-14 DIAGNOSIS — H353132 Nonexudative age-related macular degeneration, bilateral, intermediate dry stage: Secondary | ICD-10-CM | POA: Diagnosis not present

## 2016-05-14 DIAGNOSIS — H35341 Macular cyst, hole, or pseudohole, right eye: Secondary | ICD-10-CM | POA: Diagnosis not present

## 2016-05-14 LAB — HM DIABETES EYE EXAM

## 2016-05-16 ENCOUNTER — Encounter: Payer: Self-pay | Admitting: *Deleted

## 2016-05-16 ENCOUNTER — Other Ambulatory Visit: Payer: Commercial Managed Care - HMO | Admitting: *Deleted

## 2016-05-16 ENCOUNTER — Telehealth: Payer: Self-pay | Admitting: *Deleted

## 2016-05-16 DIAGNOSIS — I2510 Atherosclerotic heart disease of native coronary artery without angina pectoris: Secondary | ICD-10-CM

## 2016-05-16 DIAGNOSIS — E785 Hyperlipidemia, unspecified: Secondary | ICD-10-CM | POA: Diagnosis not present

## 2016-05-16 LAB — HEPATIC FUNCTION PANEL
ALBUMIN: 3.7 g/dL (ref 3.6–5.1)
ALK PHOS: 53 U/L (ref 33–130)
ALT: 19 U/L (ref 6–29)
AST: 15 U/L (ref 10–35)
BILIRUBIN TOTAL: 0.5 mg/dL (ref 0.2–1.2)
Bilirubin, Direct: 0.1 mg/dL (ref <=0.2)
Indirect Bilirubin: 0.4 mg/dL (ref 0.2–1.2)
TOTAL PROTEIN: 6.2 g/dL (ref 6.1–8.1)

## 2016-05-16 LAB — LIPID PANEL
CHOL/HDL RATIO: 5.9 ratio — AB (ref <=5.0)
Cholesterol: 225 mg/dL — ABNORMAL HIGH (ref 125–200)
HDL: 38 mg/dL — AB (ref >=46)
LDL CALC: 141 mg/dL — AB (ref <130)
TRIGLYCERIDES: 228 mg/dL — AB (ref <150)
VLDL: 46 mg/dL — ABNORMAL HIGH (ref <30)

## 2016-05-16 NOTE — Telephone Encounter (Signed)
Lvm to go over lab results .

## 2016-05-18 ENCOUNTER — Telehealth: Payer: Self-pay | Admitting: *Deleted

## 2016-05-18 ENCOUNTER — Telehealth: Payer: Self-pay | Admitting: Physician Assistant

## 2016-05-18 DIAGNOSIS — E785 Hyperlipidemia, unspecified: Secondary | ICD-10-CM

## 2016-05-18 MED ORDER — EZETIMIBE 10 MG PO TABS: 10.0000 mg | ORAL_TABLET | Freq: Every day | ORAL | 3 refills | Status: DC

## 2016-05-18 NOTE — Telephone Encounter (Signed)
Pt has been notified of lab results and recommendations to increase Pravastatin from 3 days a week up to 4 days a week. Pt states she is not going to take Pravastatin anymore due to myalgia's. I asked pt if she had taken any other cholesterol medications in the past and she could not remember which ones. I reviewed chart further and found that pt had tried lipitor (myalgia's) and simvastatin (GI side effects and myalgia's) in the past. I advised pt that I will d/w Nicki Reaper W. PA for further recommendations and that we may need to try another medication. Pt states she is not going to take something that will cause her leg pain and muscle cramps. Pt is agreeable though to try something else.

## 2016-05-18 NOTE — Telephone Encounter (Signed)
New message ° ° ° ° °Returning a call to the nurse to get lab results °

## 2016-05-18 NOTE — Telephone Encounter (Signed)
S/w pt and advised pt of recommendation from [facility] about Zetia. Pt is willing to try Zetia 10 mg daily since she has a intolerance to other statins in the past. Per Nicki Reaper W. PA to repeat LFT/FLP in 3 months; which will take [place] into 08/2016 since we are just about in October.   Lipid and Liver panel will be done when she see's PCP 08/30/16 for her annual CPX. I did advise pt if she finds out that LFT/FLP will not be done to call our office so that we may schedule lab. Rx for Zetia has been sent in to [facility] per pt request. Pt thanked me for my time in helping her.

## 2016-05-18 NOTE — Telephone Encounter (Signed)
Pt has been notified of lab results. When giving recommendations to increase Pravastatin, pt states to me that she is not taking this anymore.

## 2016-05-21 ENCOUNTER — Other Ambulatory Visit: Payer: Self-pay | Admitting: Family Medicine

## 2016-05-23 ENCOUNTER — Encounter: Payer: Self-pay | Admitting: Cardiology

## 2016-05-23 ENCOUNTER — Ambulatory Visit (INDEPENDENT_AMBULATORY_CARE_PROVIDER_SITE_OTHER): Payer: Commercial Managed Care - HMO | Admitting: Cardiology

## 2016-05-23 VITALS — BP 112/62 | HR 88 | Ht 61.5 in | Wt 172.8 lb

## 2016-05-23 DIAGNOSIS — I2510 Atherosclerotic heart disease of native coronary artery without angina pectoris: Secondary | ICD-10-CM | POA: Diagnosis not present

## 2016-05-23 DIAGNOSIS — I10 Essential (primary) hypertension: Secondary | ICD-10-CM | POA: Diagnosis not present

## 2016-05-23 DIAGNOSIS — E7800 Pure hypercholesterolemia, unspecified: Secondary | ICD-10-CM | POA: Diagnosis not present

## 2016-05-23 DIAGNOSIS — R0789 Other chest pain: Secondary | ICD-10-CM | POA: Insufficient documentation

## 2016-05-23 DIAGNOSIS — I25118 Atherosclerotic heart disease of native coronary artery with other forms of angina pectoris: Secondary | ICD-10-CM | POA: Insufficient documentation

## 2016-05-23 HISTORY — DX: Atherosclerotic heart disease of native coronary artery without angina pectoris: I25.10

## 2016-05-23 NOTE — Patient Instructions (Signed)
Medication Instructions:  Your physician recommends that you continue on your current medications as directed. Please refer to the Current Medication list given to you today.   Labwork: None  Testing/Procedures: None  Follow-Up: Your physician recommends that you schedule a follow-up appointment with the [facility].  Your physician wants you to follow-up in: 1 year with Dr. Radford Pax. You will receive a reminder letter in the mail two months in advance. If you don't receive a letter, please call our office to schedule the follow-up appointment.   Any Other Special Instructions Will Be Listed Below (If Applicable).     If you need a refill on your cardiac medications before your next appointment, please call your pharmacy.

## 2016-05-23 NOTE — Progress Notes (Signed)
Cardiology Office Note    Date:  05/23/2016   ID:  Erin Beck, DOB [birth year], MRN [id number]  PCP:  Erin Pardon, MD  Cardiologist:  [name], MD   Chief Complaint  Patient presents with  . Coronary Artery Disease  . Hypertension  . Hyperlipidemia    History of Present Illness:  Erin Beck is a 75 y.o. female with a hx of HTN, HL, diabetes, GERD who saw me initially for chest pain, dyspnea and dizziness. Echocardiogram demonstrated mild LVH, normal LV function and mild diastolic dysfunction. Myoview did demonstrate moderate inferior wall ischemia with an EF of 62%. This is felt to be intermediate risk. LHC demonstrated 40% mid LAD and 60% RI and no sig obstructive CAD in any territory.  Patient did have improvement in artery diameter with IC NTG.  Nuclear study was felt to be false +.  She is now here for followup.  She is feeling well.  She thinks all of her symptoms were related to Metformin.  She stopped this drug and feels much better.  Denies dyspnea, orthopnea, PND, edema, syncope.  She denies any further CP since her cath.  She has not had any further palpitations.  Heart monitor showed PACs and PVCs.      Past Medical History:  Diagnosis Date  . Allergic rhinitis   . Anxiety   . Arthritis    OA  . Asthma   . CAD (coronary artery disease), native coronary artery 05/23/2016   Non obstructive with 40% mid LAD and 60% ramus  . Complication of anesthesia    gas bubble rt eye from an old retinal detachment  . Diabetes mellitus    type II  . Diverticulitis    Hx of  . Full dentures   . GERD (gastroesophageal reflux disease)   . Hematuria    HX OF MICROSCOPIC BLOOD IN URINE AND HX OF CYST ON ONE KIDNEY--BUT NO KIDNEY DISEASE  . Hyperlipidemia   . Hypertension   . IBS (irritable bowel syndrome)   . Osteopenia   . Wears glasses     Past Surgical History:  Procedure Laterality Date  . ABDOMINAL HYSTERECTOMY    . APPENDECTOMY     per pt  . CARDIAC  CATHETERIZATION N/A 12/29/2015   Procedure: Left Heart Cath and Coronary Angiography;  Surgeon: Belva Crome, MD;  Location: [facility];  Service: Cardiovascular;  Laterality: N/A;  . CARPAL TUNNEL RELEASE     both hands  . CHOLECYSTECTOMY    . COLONOSCOPY    . EYE SURGERY     BILATERAL CATARACTS REMOVED  . EYE SURGERY     gas bubble in rt eye from a retinal detachment  . JOINT REPLACEMENT  2000   LEFT TOTAL KNEE ARTHROPLASTY  . JOINT REPLACEMENT  2013   rt total knee  . KNEE ARTHROSCOPY  1990   chondromalacia   . TOTAL KNEE ARTHROPLASTY  12/05/2011   Procedure: TOTAL KNEE ARTHROPLASTY;  Surgeon: Magnus Sinning, MD;  Location: [facility];  Service: Orthopedics;  Laterality: Right;  . ULNAR NERVE TRANSPOSITION Right 09/15/2014   Procedure: Right cubital tunnel release with transposition of ulnar nerve;  Surgeon: Marianna Payment, MD;  Location: [facility];  Service: Orthopedics;  Laterality: Right;    Current Medications: Outpatient Medications Prior to Visit  Medication Sig Dispense Refill  . ACCU-CHEK FASTCLIX LANCETS MISC TEST FOUR TIMES DAILY AS DIRECTED 400 each 3  . ACCU-CHEK SMARTVIEW  test strip USE TO TEST BLOOD SUGAR 2 TIMES DAILY AS INSTRUCTED. 200 each 3  . acetaminophen (TYLENOL) 325 MG tablet Take 2 tablets (650 mg total) by mouth every 6 (six) hours as needed.    Marland Kitchen albuterol (VENTOLIN HFA) 108 (90 Base) MCG/ACT inhaler Inhale 2 puffs into the lungs every 6 (six) hours as needed for wheezing or shortness of breath. Reported on 12/30/2015    . aspirin 81 MG tablet Take 81 mg by mouth daily.    . cetirizine (ZYRTEC) 10 MG tablet Take 10 mg by mouth daily as needed for allergies.    Marland Kitchen ezetimibe (ZETIA) 10 MG tablet Take 1 tablet (10 mg total) by mouth daily. 90 tablet 3  . glipiZIDE (GLIPIZIDE XL) 5 MG 24 hr tablet Take 1 tablet (5 mg total) by mouth daily with breakfast. 90 tablet 3  . hydrochlorothiazide (HYDRODIURIL) 25 MG tablet TAKE 1 TABLET EVERY  DAY 90 tablet 1  . lisinopril (PRINIVIL,ZESTRIL) 10 MG tablet Take 1 tablet (10 mg total) by mouth daily. 90 tablet 3  . metroNIDAZOLE (FLAGYL) 500 MG tablet Take 1 tablet (500 mg total) by mouth 3 (three) times daily. 21 tablet 0  . nitroGLYCERIN (NITROSTAT) 0.4 MG SL tablet Place 1 tablet (0.4 mg total) under the tongue every 5 (five) minutes as needed for chest pain. 25 tablet 3  . omeprazole (PRILOSEC) 20 MG capsule TAKE 1 CAPSULE EVERY DAY 90 capsule 1  . ciprofloxacin (CIPRO) 250 MG tablet Take 1 tablet (250 mg total) by mouth 2 (two) times daily. (Patient not taking: Reported on 05/23/2016) 14 tablet 0   No facility-administered medications prior to visit.      Allergies:   Naproxen; Naproxen sodium; Aspirin; Atorvastatin; Buspar [buspirone hcl]; Gabapentin; Metformin and related; Norco [hydrocodone-acetaminophen]; Pravastatin; Simvastatin; Sulfa antibiotics; and Sulfonamide derivatives   Social History   Social History  . Marital status: Married    Spouse name: N/A  . Number of children: N/A  . Years of education: N/A   Social History Main Topics  . Smoking status: Former Smoker    Packs/day: 1.00    Years: 48.00    Quit date: 08/20/2009  . Smokeless tobacco: Never Used  . Alcohol use No  . Drug use: No  . Sexual activity: Not Asked   Other Topics Concern  . None   Social History Narrative  . None     Family History:  The patient's family history includes Cancer in her father; Diabetes in her brother and father; Heart disease in her maternal grandmother and paternal grandmother; Pneumonia in her mother.   ROS:   Please see the history of present illness.    ROS All other systems reviewed and are negative.  No flowsheet data found.     PHYSICAL EXAM:   VS:  BP 112/62   Pulse 88   Ht 5' 1.5" (1.562 m)   Wt 172 lb 12.8 oz (78.4 kg)   SpO2 95%   BMI 32.12 kg/m    GEN: Well nourished, well developed, in no acute distress  HEENT: normal  Neck: no JVD, carotid  bruits, or masses Cardiac: RRR; no murmurs, rubs, or gallops,no edema.  Intact distal pulses bilaterally.  Respiratory:  clear to auscultation bilaterally, normal work of breathing GI: soft, nontender, nondistended, + BS MS: no deformity or atrophy  Skin: warm and dry, no rash Neuro:  Alert and Oriented x 3, Strength and sensation are intact Psych: euthymic mood, full affect  Wt Readings  from Last 3 Encounters:  05/23/16 172 lb 12.8 oz (78.4 kg)  04/30/16 174 lb 8 oz (79.2 kg)  04/19/16 176 lb 12 oz (80.2 kg)      Studies/Labs Reviewed:   EKG:  EKG is not ordered today.    Recent Labs: 06/10/2015: TSH 2.67 03/27/2016: BUN 19; Creatinine, Ser 0.96; Potassium 3.9; Sodium 136 05/09/2016: Hemoglobin 14.1; Platelets 538.0 05/16/2016: ALT 19   Lipid Panel    Component Value Date/Time   CHOL 225 (H) 05/16/2016 0811   TRIG 228 (H) 05/16/2016 0811   HDL 38 (L) 05/16/2016 0811   CHOLHDL 5.9 (H) 05/16/2016 0811   VLDL 46 (H) 05/16/2016 0811   LDLCALC 141 (H) 05/16/2016 0811   LDLDIRECT 154.0 06/10/2015 0828    Additional studies/ records that were reviewed today include:  Cath report    ASSESSMENT:    1. Coronary artery disease involving native coronary artery of native heart without angina pectoris   2. HYPERTENSION, BENIGN ESSENTIAL   3. Pure hypercholesterolemia      PLAN:  In order of problems listed above:  1. ASCAD - nonobstructive of the LAD and Ramus.  Needs aggressive secondary prevention treatment.  Continue ASA.  She is statin intolerant.   3.   HTN - BP controlled on current meds.  Continue ACE I and diuretic.  2. Hyperlipidemia with LDL goal < 70.  Continue zetia.  She is statin intolerant so I will refer her to lipid clinic to see if she qualifies for PCS[facility] 9 drug.     Medication Adjustments/Labs and Tests Ordered: Current medicines are reviewed at length with the patient today.  Concerns regarding medicines are outlined above.  Medication changes, Labs and  Tests ordered today are listed in the Patient Instructions below.  There are no Patient Instructions on file for this visit.   Signed, [name], MD  05/23/2016 3:29 PM    [address], [place], [place]  [address] Phone: [phone]; Fax: [phone]

## 2016-05-25 ENCOUNTER — Other Ambulatory Visit (INDEPENDENT_AMBULATORY_CARE_PROVIDER_SITE_OTHER): Payer: Commercial Managed Care - HMO

## 2016-05-25 DIAGNOSIS — D75839 Thrombocytosis, unspecified: Secondary | ICD-10-CM

## 2016-05-25 DIAGNOSIS — D473 Essential (hemorrhagic) thrombocythemia: Secondary | ICD-10-CM

## 2016-05-25 LAB — CBC WITH DIFFERENTIAL/PLATELET
BASOS ABS: 0 10*3/uL (ref 0.0–0.1)
BASOS PCT: 0.5 % (ref 0.0–3.0)
EOS ABS: 0.3 10*3/uL (ref 0.0–0.7)
Eosinophils Relative: 2.9 % (ref 0.0–5.0)
HCT: 42.3 % (ref 36.0–46.0)
HEMOGLOBIN: 14.3 g/dL (ref 12.0–15.0)
Lymphocytes Relative: 13.3 % (ref 12.0–46.0)
Lymphs Abs: 1.2 10*3/uL (ref 0.7–4.0)
MCHC: 33.8 g/dL (ref 30.0–36.0)
MCV: 89.5 fl (ref 78.0–100.0)
MONO ABS: 0.5 10*3/uL (ref 0.1–1.0)
Monocytes Relative: 5.8 % (ref 3.0–12.0)
Neutro Abs: 7 10*3/uL (ref 1.4–7.7)
Neutrophils Relative %: 77.5 % — ABNORMAL HIGH (ref 43.0–77.0)
Platelets: 283 10*3/uL (ref 150.0–400.0)
RBC: 4.73 Mil/uL (ref 3.87–5.11)
RDW: 14.4 % (ref 11.5–15.5)
WBC: 9.1 10*3/uL (ref 4.0–10.5)

## 2016-05-28 ENCOUNTER — Encounter: Payer: Self-pay | Admitting: *Deleted

## 2016-05-29 ENCOUNTER — Ambulatory Visit: Payer: Commercial Managed Care - HMO

## 2016-05-29 NOTE — Progress Notes (Deleted)
05/29/2016 Erin Beck January 30, 1941 [id number]   HPI:  Erin Beck is a 75 y.o. female patient of Dr Radford Pax, who presents today for a lipid clinic evaluation.  Current Medications:  Ezetimibe 10 mg qd  Risk Factors:  CAD  Cholesterol Goals:  LDL < 70   Intolerant/previously tried:  Atorvastatin, simvastatin, pravastatin 20 mg (12/2015)  Family history:   Diet:   Exercise:    Labs:  04-2016 - TC 225, TG 228, HDL 38, LDL 141 (ezetimibe 10 mg) 05-2015 -  TC 223, TG 268, HDL 36.5, LDL 154  Current Outpatient Prescriptions  Medication Sig Dispense Refill  . ACCU-CHEK FASTCLIX LANCETS MISC TEST FOUR TIMES DAILY AS DIRECTED 400 each 3  . ACCU-CHEK SMARTVIEW test strip USE TO TEST BLOOD SUGAR 2 TIMES DAILY AS INSTRUCTED. 200 each 3  . acetaminophen (TYLENOL) 325 MG tablet Take 2 tablets (650 mg total) by mouth every 6 (six) hours as needed.    Marland Kitchen albuterol (VENTOLIN HFA) 108 (90 Base) MCG/ACT inhaler Inhale 2 puffs into the lungs every 6 (six) hours as needed for wheezing or shortness of breath. Reported on 12/30/2015    . aspirin 81 MG tablet Take 81 mg by mouth daily.    . cetirizine (ZYRTEC) 10 MG tablet Take 10 mg by mouth daily as needed for allergies.    Marland Kitchen ezetimibe (ZETIA) 10 MG tablet Take 1 tablet (10 mg total) by mouth daily. 90 tablet 3  . glipiZIDE (GLIPIZIDE XL) 5 MG 24 hr tablet Take 1 tablet (5 mg total) by mouth daily with breakfast. 90 tablet 3  . hydrochlorothiazide (HYDRODIURIL) 25 MG tablet TAKE 1 TABLET EVERY DAY 90 tablet 1  . lisinopril (PRINIVIL,ZESTRIL) 10 MG tablet Take 1 tablet (10 mg total) by mouth daily. 90 tablet 3  . metroNIDAZOLE (FLAGYL) 500 MG tablet Take 1 tablet (500 mg total) by mouth 3 (three) times daily. 21 tablet 0  . nitroGLYCERIN (NITROSTAT) 0.4 MG SL tablet Place 1 tablet (0.4 mg total) under the tongue every 5 (five) minutes as needed for chest pain. 25 tablet 3  . omeprazole (PRILOSEC) 20 MG capsule TAKE 1 CAPSULE EVERY DAY 90  capsule 1   No current facility-administered medications for this visit.     Allergies  Allergen Reactions  . Naproxen Rash    Trouble breathing  . Naproxen Sodium Rash    Trouble breathing  . Aspirin     REACTION: burns stomach PT STATES SHE DOES TOLERATE THE 81 MG ASPIRIN DAILY  . Atorvastatin     REACTION: muscle pain  . Buspar [Buspirone Hcl]     Multiple side eff  . Gabapentin Other (See Comments)    Palpitations/ dizziness   . Metformin And Related     Mm aches, palpitations, SOB, worse eyesight, loss of balance, sweating, low CBGs  . Norco [Hydrocodone-Acetaminophen] Other (See Comments)    Dizzy and problems remembering   . Pravastatin     Leg pain  . Simvastatin     REACTION: GI side eff and swelling  . Sulfa Antibiotics Other (See Comments)    Severe joint pain  . Sulfonamide Derivatives     REACTION: reaction not known    Past Medical History:  Diagnosis Date  . Allergic rhinitis   . Anxiety   . Arthritis    OA  . Asthma   . CAD (coronary artery disease), native coronary artery 05/23/2016   Non obstructive with 40% mid LAD and 60% ramus  .  Complication of anesthesia    gas bubble rt eye from an old retinal detachment  . Diabetes mellitus    type II  . Diverticulitis    Hx of  . Full dentures   . GERD (gastroesophageal reflux disease)   . Hematuria    HX OF MICROSCOPIC BLOOD IN URINE AND HX OF CYST ON ONE KIDNEY--BUT NO KIDNEY DISEASE  . Hyperlipidemia   . Hypertension   . IBS (irritable bowel syndrome)   . Osteopenia   . Wears glasses     There were no vitals taken for this visit.   No problem-specific Assessment & Plan notes found for this encounter.   Tommy Medal PharmD CPP [facility]

## 2016-07-06 ENCOUNTER — Ambulatory Visit (INDEPENDENT_AMBULATORY_CARE_PROVIDER_SITE_OTHER): Payer: Commercial Managed Care - HMO | Admitting: Internal Medicine

## 2016-07-06 ENCOUNTER — Encounter: Payer: Self-pay | Admitting: Internal Medicine

## 2016-07-06 ENCOUNTER — Other Ambulatory Visit: Payer: Self-pay

## 2016-07-06 VITALS — BP 160/80 | HR 91 | Wt 175.0 lb

## 2016-07-06 DIAGNOSIS — E1159 Type 2 diabetes mellitus with other circulatory complications: Secondary | ICD-10-CM | POA: Diagnosis not present

## 2016-07-06 DIAGNOSIS — Z794 Long term (current) use of insulin: Principal | ICD-10-CM

## 2016-07-06 LAB — POCT GLYCOSYLATED HEMOGLOBIN (HGB A1C): HEMOGLOBIN A1C: 6.2

## 2016-07-06 MED ORDER — ACCU-CHEK NANO SMARTVIEW W/DEVICE KIT: PACK | 0 refills | Status: DC

## 2016-07-06 NOTE — Progress Notes (Signed)
Patient ID: [name], female   DOB: June 01, 1941, 75 y.o.   MRN: [id number]  HPI: Erin Beck is a 76 y.o.-year-old female, returning for f/u for DM2, non-insulin-dependent, dx 2005, uncontrolled, with complications (CAD). Last visit 3 mo ago.  She will have a new TKR soon as her R knee replacement was not successful.  Last hemoglobin A1c was: Lab Results  Component Value Date   HGBA1C 6.8 (H) 03/27/2016   HGBA1C 6.4 01/06/2016   HGBA1C 6.0 10/18/2015  Labs from Dr. Erlinda Hong [facility], 08/30/2014): HbA1c 6.9%, decreased from 8.0%  Pt is on a regimen of: - Glipizide XL 5 >> 2.5 >> 5 mg in am Stopped Metformin ER 1500 mg in pm before cath but she was not feeling well on it (diarrhea, weakness, thirst, leg swelling, blurry vision) She has not been on insulin before, would not like to stick herself.  Pt checks her sugars 2-3 a day: - am: 91-140 >> 101-130 >> 94-120 >> 80-119 >> 90-153 >> 92-143 >> 89, 92-134 - 2h after b'fast: 89, 184, 231 >> 127-152 >> 99-144 >> 112-140 >> 111-158 >> 110-139 - before lunch: 97-152 >> 79, 87-136 >> 87-127 >> 107-138, 151 >>73,  90-138 >> 68x1, 83-125 - 2h after lunch: 114-150 >> 91-149, 163 >> 117-147 >> 74, 80-152, 172 >> 76, 111-140 - before dinner:81-102 >> 91-137, 151, 171 >> 108-134, 182 >> 86-135 >> 82-128 - 2h after dinner: 103-190 >> 121-177 >> 95-162 >> 130-182, 236 >> 96-161 >> 95-151 - bedtime: 81, 84-122 >> 80-133 >> 80-126 >> 123-157 >> 106-148, 157 >> 92-134 - nighttime: 88-115 >> 83-111 >> 75s (not in last mo) >> 115-164 >> 134 >> 111-134 No lows. Lowest sugar was 74 >> 68x1;  she has hypoglycemia awareness at 80.  Highest sugar was 170s >> 151  Pt's meals are: - Breakfast: cereals - Lunch: salads, 1 sandwich, leftovers - Dinner: salmon/chicken, vegetables - Snacks: 1 fruit, crackers - not every day Cut out soft drinks.  - no CKD, last BUN/creatinine:  Lab Results  Component Value Date   BUN 19 03/27/2016   CREATININE 0.96  03/27/2016  On Lisinopril. - last set of lipids: Lab Results  Component Value Date   CHOL 225 (H) 05/16/2016   HDL 38 (L) 05/16/2016   LDLCALC 141 (H) 05/16/2016   LDLDIRECT 154.0 06/10/2015   TRIG 228 (H) 05/16/2016   CHOLHDL 5.9 (H) 05/16/2016  She was on a statin >> AP.  - last eye exam was in 04/2016 No DR. Had cataracts sx. - no numbness and tingling in her feet.  I reviewed pt's medications, allergies, PMH, social hx, family hx, and changes were documented in the history of present illness. Otherwise, unchanged from my initial visit note.  ROS: Constitutional: no weight gain, no fatigue, no subjective hyperthermia/hypothermia Eyes: no blurry vision, no xerophthalmia ENT: no sore throat, no nodules palpated in throat, no dysphagia/odynophagia, no hoarseness, + decreased hearing Cardiovascular: no CP/SOB/palpitations/leg swelling Respiratory: no cough/SOB Gastrointestinal: no N/V/D/C Musculoskeletal: no muscle/joint aches Skin: no rashes Neurological: no tremors/numbness/tingling/dizziness  PE: BP (!) 160/80   Pulse 91   Wt 175 lb (79.4 kg)   SpO2 97%   BMI 32.53 kg/m  Body mass index is 32.53 kg/m. Wt Readings from Last 3 Encounters:  07/06/16 175 lb (79.4 kg)  05/23/16 172 lb 12.8 oz (78.4 kg)  04/30/16 174 lb 8 oz (79.2 kg)   Constitutional: overweight, in NAD Eyes: PERRLA, EOMI, no exophthalmos ENT: moist mucous  membranes, no thyromegaly, no cervical lymphadenopathy Cardiovascular: RRR, No MRG Respiratory: CTA B Gastrointestinal: abdomen soft, NT, ND, BS+ Musculoskeletal: no deformities, strength intact in all 4 Skin: moist, warm, no rashes Neurological: no tremor with outstretched hands, DTR normal in all 4  ASSESSMENT: 1. DM2, non-insulin-dependent, uncontrolled, with complications - CAD  PLAN:  1. Patient with long-standing, well controlled diabetes, on oral antidiabetic regimen, with good control. She stopped Metformin and she felt much better w/o  it >> stayed off afterwards. Now on Glipizide XL back to 5 mg. Sugars are great! No lows except 1x in the 60s , 2/2 delayed meal. Reviewed last HbA1>> 6.2% (Better!). Will continue with only Glipizide XL. - I suggested to:  Patient Instructions  Please continue - Glipizide XL 5 mg in am.  Please return in 4 months with your sugar log.   - continue checking sugars at different times of the day - check 1-2 times a day, rotating checks - advised for yearly eye exams >> she is UTD - She is statin intolerant. Lipids managed by cardiology. - Return to clinic in 4 mo with sugar log   Philemon Kingdom, MD PhD [facility]

## 2016-07-06 NOTE — Patient Instructions (Signed)
Please continue - Glipizide XL 5 mg in am.  Please return in 4 months with your sugar log.  

## 2016-07-06 NOTE — Addendum Note (Signed)
Addended by: Inocencio Homes on: 07/06/2016 04:26 PM   Modules accepted: Orders

## 2016-08-30 ENCOUNTER — Other Ambulatory Visit: Payer: Commercial Managed Care - HMO

## 2016-08-30 ENCOUNTER — Ambulatory Visit: Payer: Commercial Managed Care - HMO

## 2016-09-03 ENCOUNTER — Other Ambulatory Visit: Payer: Self-pay | Admitting: Family Medicine

## 2016-09-05 ENCOUNTER — Encounter: Payer: Commercial Managed Care - HMO | Admitting: Family Medicine

## 2016-09-17 ENCOUNTER — Encounter: Payer: Self-pay | Admitting: Internal Medicine

## 2016-09-17 ENCOUNTER — Ambulatory Visit (INDEPENDENT_AMBULATORY_CARE_PROVIDER_SITE_OTHER): Payer: Commercial Managed Care - HMO | Admitting: Internal Medicine

## 2016-09-17 VITALS — BP 116/68 | HR 80 | Temp 97.6°F | Wt 176.0 lb

## 2016-09-17 DIAGNOSIS — L298 Other pruritus: Secondary | ICD-10-CM

## 2016-09-17 DIAGNOSIS — R300 Dysuria: Secondary | ICD-10-CM

## 2016-09-17 DIAGNOSIS — N898 Other specified noninflammatory disorders of vagina: Secondary | ICD-10-CM | POA: Diagnosis not present

## 2016-09-17 LAB — POC URINALSYSI DIPSTICK (AUTOMATED)
BILIRUBIN UA: NEGATIVE
Glucose, UA: NEGATIVE
KETONES UA: NEGATIVE
Nitrite, UA: NEGATIVE
PH UA: 6.5
PROTEIN UA: NEGATIVE
SPEC GRAV UA: 1.01
Urobilinogen, UA: NEGATIVE

## 2016-09-17 MED ORDER — FLUCONAZOLE 150 MG PO TABS: ORAL_TABLET | ORAL | 0 refills | Status: DC

## 2016-09-17 NOTE — Addendum Note (Signed)
Addended by: Lurlean Nanny on: 09/17/2016 02:33 PM   Modules accepted: Orders

## 2016-09-17 NOTE — Patient Instructions (Signed)

## 2016-09-17 NOTE — Progress Notes (Signed)
HPI  Pt presents to the clinic today with c/o dysuria, vaginal discharge, irritation and itching. This started 1 week ago. The vaginal discharge has no color, but she reports it is there. She denies recent antibiotic use. She does not use bubble baths. She does not douche. She has not tried anything OTC for this. She does have a history of DM.   Review of Systems        Past Medical History:  Diagnosis Date  . Allergic rhinitis   . Anxiety   . Arthritis    OA  . Asthma   . CAD (coronary artery disease), native coronary artery 05/23/2016   Non obstructive with 40% mid LAD and 60% ramus  . Complication of anesthesia    gas bubble rt eye from an old retinal detachment  . Diabetes mellitus    type II  . Diverticulitis    Hx of  . Full dentures   . GERD (gastroesophageal reflux disease)   . Hematuria    HX OF MICROSCOPIC BLOOD IN URINE AND HX OF CYST ON ONE KIDNEY--BUT NO KIDNEY DISEASE  . Hyperlipidemia   . Hypertension   . IBS (irritable bowel syndrome)   . Osteopenia   . Wears glasses     Family History  Problem Relation Age of Onset  . Pneumonia Mother   . Diabetes Father   . Cancer Father     of the gallbladder  . Heart disease Maternal Grandmother   . Heart disease Paternal Grandmother   . Diabetes Brother     Social History   Social History  . Marital status: Married    Spouse name: N/A  . Number of children: N/A  . Years of education: N/A   Occupational History  . Not on file.   Social History Main Topics  . Smoking status: Former Smoker    Packs/day: 1.00    Years: 48.00    Quit date: 08/20/2009  . Smokeless tobacco: Never Used  . Alcohol use No  . Drug use: No  . Sexual activity: Not on file   Other Topics Concern  . Not on file   Social History Narrative  . No narrative on file    Allergies  Allergen Reactions  . Naproxen Rash    Trouble breathing  . Naproxen Sodium Rash    Trouble breathing  . Aspirin     REACTION: burns stomach PT  STATES SHE DOES TOLERATE THE 81 MG ASPIRIN DAILY  . Atorvastatin     REACTION: muscle pain  . Buspar [Buspirone Hcl]     Multiple side eff  . Gabapentin Other (See Comments)    Palpitations/ dizziness   . Metformin And Related     Mm aches, palpitations, SOB, worse eyesight, loss of balance, sweating, low CBGs  . Norco [Hydrocodone-Acetaminophen] Other (See Comments)    Dizzy and problems remembering   . Pravastatin     Leg pain  . Simvastatin     REACTION: GI side eff and swelling  . Sulfa Antibiotics Other (See Comments)    Severe joint pain  . Sulfonamide Derivatives     REACTION: reaction not known     GU: Pt reports vaginal discharge, irritation, itching and dysuria. She denies urgency, frequency or blood in her urine.   No other specific complaints in a complete review of systems (except as listed in HPI above).  Objective:   BP 116/68   Pulse 80   Temp 97.6 F (36.4 C) (Oral)  Wt 176 lb (79.8 kg)   SpO2 98%   BMI 32.72 kg/m   Wt Readings from Last 3 Encounters:  09/17/16 176 lb (79.8 kg)  07/06/16 175 lb (79.4 kg)  05/23/16 172 lb 12.8 oz (78.4 kg)     Abdomen: Soft, nontender. Active bowel sounds. Pelvic: Deferred per pt request.      Assessment & Plan:   Vaginal discharge, itching and dysuria:  Pt thinks she has a yeast infection and does not want a female exam today Urinalysis: 1+ leuks Will send urine culture eRx for Diflucan 150 mg tabs, 1 today, then 1 Thursday  RTC as needed or if symptoms persist.   Webb Silversmith, NP

## 2016-09-18 LAB — URINE CULTURE

## 2016-09-24 ENCOUNTER — Telehealth: Payer: Self-pay

## 2016-09-24 MED ORDER — FLUCONAZOLE 150 MG PO TABS: ORAL_TABLET | ORAL | 0 refills | Status: DC

## 2016-09-24 MED ORDER — TERCONAZOLE 0.8 % VA CREA: TOPICAL_CREAM | VAGINAL | 0 refills | Status: DC

## 2016-09-24 NOTE — Telephone Encounter (Signed)
Pt said after taking the diflucan her sxs got better but never resolved and slowly her vaginal itching got worse and now from her scratching so much she is raw and it hurts to urinate and washing up. Pt request another round of diflucan but she also asked if there was a vaginal cream she can use for the outside to help with the itching and help with the pain from scratching so much

## 2016-09-24 NOTE — Telephone Encounter (Signed)
I sent both cream and [facility]lucan [facility] If no imp or if it returns she should come in for an appt and examination  Hope this helps

## 2016-09-24 NOTE — Telephone Encounter (Signed)
Pt notified both Rxs sent to pharmacy and advise to f/u if no improvement, pt verbalized understanding

## 2016-09-24 NOTE — Telephone Encounter (Signed)
V/M left; pt last seen 09/17/16 and pt was to cb with update after taking med. Med has helped but has not cleared all symptoms; pt thinks needs another med or refill on diflucan to get all symptoms cleared.Please advise.

## 2016-09-24 NOTE — Telephone Encounter (Signed)
What symptoms are not improved?

## 2016-09-28 ENCOUNTER — Telehealth: Payer: Self-pay | Admitting: Family Medicine

## 2016-09-28 DIAGNOSIS — Z0000 Encounter for general adult medical examination without abnormal findings: Secondary | ICD-10-CM

## 2016-09-28 NOTE — Telephone Encounter (Signed)
-----   Message from Eustace Pen, LPN sent at [id number]  8:12 AM EST ----- Regarding: Lab Orders 9/12 Please place lab orders, if any. Thank you.

## 2016-10-01 ENCOUNTER — Ambulatory Visit (INDEPENDENT_AMBULATORY_CARE_PROVIDER_SITE_OTHER): Payer: Medicare HMO

## 2016-10-01 ENCOUNTER — Other Ambulatory Visit: Payer: Self-pay | Admitting: Family Medicine

## 2016-10-01 VITALS — BP 122/70 | HR 79 | Temp 98.0°F | Ht 60.5 in | Wt 172.5 lb

## 2016-10-01 DIAGNOSIS — Z0000 Encounter for general adult medical examination without abnormal findings: Secondary | ICD-10-CM

## 2016-10-01 LAB — COMPREHENSIVE METABOLIC PANEL
ALBUMIN: 4.2 g/dL (ref 3.5–5.2)
ALT: 34 U/L (ref 0–35)
AST: 24 U/L (ref 0–37)
Alkaline Phosphatase: 62 U/L (ref 39–117)
BUN: 21 mg/dL (ref 6–23)
CALCIUM: 9.4 mg/dL (ref 8.4–10.5)
CO2: 30 meq/L (ref 19–32)
CREATININE: 0.92 mg/dL (ref 0.40–1.20)
Chloride: 102 mEq/L (ref 96–112)
GFR: 63.18 mL/min (ref >60.00)
Glucose, Bld: 162 mg/dL — ABNORMAL HIGH (ref 70–99)
Potassium: 3.8 mEq/L (ref 3.5–5.1)
Sodium: 136 mEq/L (ref 135–145)
Total Bilirubin: 0.5 mg/dL (ref 0.2–1.2)
Total Protein: 7.2 g/dL (ref 6.0–8.3)

## 2016-10-01 LAB — CBC WITH DIFFERENTIAL/PLATELET
BASOS ABS: 0 10*3/uL (ref 0.0–0.1)
Basophils Relative: 0.5 % (ref 0.0–3.0)
EOS ABS: 0.3 10*3/uL (ref 0.0–0.7)
Eosinophils Relative: 3.4 % (ref 0.0–5.0)
HEMATOCRIT: 44.5 % (ref 36.0–46.0)
HEMOGLOBIN: 15.1 g/dL — AB (ref 12.0–15.0)
LYMPHS PCT: 16 % (ref 12.0–46.0)
Lymphs Abs: 1.4 10*3/uL (ref 0.7–4.0)
MCHC: 34 g/dL (ref 30.0–36.0)
MCV: 90.1 fl (ref 78.0–100.0)
Monocytes Absolute: 0.5 10*3/uL (ref 0.1–1.0)
Monocytes Relative: 6.3 % (ref 3.0–12.0)
Neutro Abs: 6.4 10*3/uL (ref 1.4–7.7)
Neutrophils Relative %: 73.8 % (ref 43.0–77.0)
PLATELETS: 264 10*3/uL (ref 150.0–400.0)
RBC: 4.94 Mil/uL (ref 3.87–5.11)
RDW: 13 % (ref 11.5–15.5)
WBC: 8.6 10*3/uL (ref 4.0–10.5)

## 2016-10-01 LAB — LIPID PANEL
CHOL/HDL RATIO: 5
Cholesterol: 232 mg/dL — ABNORMAL HIGH (ref 0–200)
HDL: 44.6 mg/dL (ref >39.00)
LDL Cholesterol: 154 mg/dL — ABNORMAL HIGH (ref 0–99)
NonHDL: 187.68
TRIGLYCERIDES: 170 mg/dL — AB (ref 0.0–149.0)
VLDL: 34 mg/dL (ref 0.0–40.0)

## 2016-10-01 LAB — TSH: TSH: 1.55 u[IU]/mL (ref 0.35–4.50)

## 2016-10-01 NOTE — Progress Notes (Signed)
PCP notes:   Health maintenance:  Foot exam - PCP will complete at next appt Tetanus - postponed/insurance  Abnormal screenings:   Hearing - failed Fall risk - hx of fall with injury   Patient concerns:   Pt reports intermittent pain with right torn rotator cuff.   Pt reports feeling nervous and anxious about spouse's health status. Pt began to visibly shed tears when she stated "I fear I may lose him".   Nurse concerns:  Addressed with pt that colonoscopy is due in April 2018. Pt expressed reluctance stating her right leg would be a barrier to her completing this screening. PCP is asked to discuss this concern with patient at next appt.   Next PCP appt:   10/23/16 @ 0830  I reviewed health advisor's note, was available for consultation, and agree with documentation and plan. [name] MD

## 2016-10-01 NOTE — Patient Instructions (Signed)
Erin Beck , Thank you for taking time to come for your Medicare Wellness Visit. I appreciate your ongoing commitment to your health goals. Please review the following plan we discussed and let me know if I can assist you in the future.   These are the goals we discussed: Goals    . safety          Starting 10/01/2016, I will continue to use cane or walker daily as needed to reduce risk of falls.        This is a list of the screening recommended for you and due dates:  Health Maintenance  Topic Date Due  . Complete foot exam   06/12/2017*  . Tetanus Vaccine  07/05/2023*  . Colon Cancer Screening  11/18/2016  . Hemoglobin A1C  01/03/2017  . Mammogram  05/11/2017  . Eye exam for diabetics  05/14/2017  . Flu Shot  Addressed  . DEXA scan (bone density measurement)  Completed  . Shingles Vaccine  Completed  . Pneumonia vaccines  Completed  *Topic was postponed. The date shown is not the original due date.   Preventive Care for Adults  A healthy lifestyle and preventive care can promote health and wellness. Preventive health guidelines for adults include the following key practices.  . A routine yearly physical is a good way to check with your health care provider about your health and preventive screening. It is a chance to share any concerns and updates on your health and to receive a thorough exam.  . Visit your dentist for a routine exam and preventive care every 6 months. Brush your teeth twice a day and floss once a day. Good oral hygiene prevents tooth decay and gum disease.  . The frequency of eye exams is based on your age, health, family medical history, use  of contact lenses, and other factors. Follow your health care provider's ecommendations for frequency of eye exams.  . Eat a healthy diet. Foods like vegetables, fruits, whole grains, low-fat dairy products, and lean protein foods contain the nutrients you need without too many calories. Decrease your intake of foods  high in solid fats, added sugars, and salt. Eat the right amount of calories for you. Get information about a proper diet from your health care provider, if necessary.  . Regular physical exercise is one of the most important things you can do for your health. Most adults should get at least 150 minutes of moderate-intensity exercise (any activity that increases your heart rate and causes you to sweat) each week. In addition, most adults need muscle-strengthening exercises on 2 or more days a week.  Silver Sneakers may be a benefit available to you. To determine eligibility, you may visit the website: www.silversneakers.com or contact program at 450-216-3440 Mon-Fri between 8AM-8PM.   . Maintain a healthy weight. The body mass index (BMI) is a screening tool to identify possible weight problems. It provides an estimate of body fat based on height and weight. Your health care provider can find your BMI and can help you achieve or maintain a healthy weight.   For adults 20 years and older: ? A BMI below 18.5 is considered underweight. ? A BMI of 18.5 to 24.9 is normal. ? A BMI of 25 to 29.9 is considered overweight. ? A BMI of 30 and above is considered obese.   . Maintain normal blood lipids and cholesterol levels by exercising and minimizing your intake of saturated fat. Eat a balanced diet with plenty of  fruit and vegetables. Blood tests for lipids and cholesterol should begin at age 58 and be repeated every 5 years. If your lipid or cholesterol levels are high, you are over 50, or you are at high risk for heart disease, you may need your cholesterol levels checked more frequently. Ongoing high lipid and cholesterol levels should be treated with medicines if diet and exercise are not working.  . If you smoke, find out from your health care provider how to quit. If you do not use tobacco, please do not start.  . If you choose to drink alcohol, please do not consume more than 2 drinks per day.  One drink is considered to be 12 ounces (355 mL) of beer, 5 ounces (148 mL) of wine, or 1.5 ounces (44 mL) of liquor.  . If you are 76-68 years old, ask your health care provider if you should take aspirin to prevent strokes.  . Use sunscreen. Apply sunscreen liberally and repeatedly throughout the day. You should seek shade when your shadow is shorter than you. Protect yourself by wearing long sleeves, pants, a wide-brimmed hat, and sunglasses year round, whenever you are outdoors.  . Once a month, do a whole body skin exam, using a mirror to look at the skin on your back. Tell your health care provider of new moles, moles that have irregular borders, moles that are larger than a pencil eraser, or moles that have changed in shape or color.

## 2016-10-01 NOTE — Progress Notes (Signed)
Subjective:   Erin Beck is a 76 y.o. female who presents for Medicare Annual (Subsequent) preventive examination.  Review of Systems:  N/A Cardiac Risk Factors include: advanced age (>85mn, >>32women);diabetes mellitus;obesity (BMI >30kg/m2);dyslipidemia;hypertension     Objective:     Vitals: BP 122/70 (BP Location: Left Arm, Patient Position: Sitting, Cuff Size: Normal)   Pulse 79   Temp 98 F (36.7 C) (Oral)   Ht 5' 0.5" (1.537 m) Comment: no shoes  Wt 172 lb 8 oz (78.2 kg)   SpO2 97%   BMI 33.13 kg/m   Body mass index is 33.13 kg/m.   Tobacco History  Smoking Status  . Former Smoker  . Packs/day: 1.00  . Years: 48.00  . Quit date: 08/20/2009  Smokeless Tobacco  . Never Used     Counseling given: No   Past Medical History:  Diagnosis Date  . Allergic rhinitis   . Anxiety   . Arthritis    OA  . Asthma   . CAD (coronary artery disease), native coronary artery 05/23/2016   Non obstructive with 40% mid LAD and 60% ramus  . Complication of anesthesia    gas bubble rt eye from an old retinal detachment  . Diabetes mellitus    type II  . Diverticulitis    Hx of  . Full dentures   . GERD (gastroesophageal reflux disease)   . Hematuria    HX OF MICROSCOPIC BLOOD IN URINE AND HX OF CYST ON ONE KIDNEY--BUT NO KIDNEY DISEASE  . Hyperlipidemia   . Hypertension   . IBS (irritable bowel syndrome)   . Osteopenia   . Wears glasses    Past Surgical History:  Procedure Laterality Date  . ABDOMINAL HYSTERECTOMY    . APPENDECTOMY     per pt  . CARDIAC CATHETERIZATION N/A 12/29/2015   Procedure: Left Heart Cath and Coronary Angiography;  Surgeon: HBelva Crome MD;  Location: [facility];  Service: Cardiovascular;  Laterality: N/A;  . CARPAL TUNNEL RELEASE     both hands  . CHOLECYSTECTOMY    . COLONOSCOPY    . EYE SURGERY     BILATERAL CATARACTS REMOVED  . EYE SURGERY     gas bubble in rt eye from a retinal detachment  . JOINT REPLACEMENT   2000   LEFT TOTAL KNEE ARTHROPLASTY  . JOINT REPLACEMENT  2013   rt total knee  . KNEE ARTHROSCOPY  1990   chondromalacia   . TOTAL KNEE ARTHROPLASTY  12/05/2011   Procedure: TOTAL KNEE ARTHROPLASTY;  Surgeon: JMagnus Sinning MD;  Location: [facility];  Service: Orthopedics;  Laterality: Right;  . ULNAR NERVE TRANSPOSITION Right 09/15/2014   Procedure: Right cubital tunnel release with transposition of ulnar nerve;  Surgeon: NMarianna Payment MD;  [place] [facility]  Service: Orthopedics;  Laterality: Right;   Family History  Problem Relation Age of Onset  . Pneumonia Mother   . Diabetes Father   . Cancer Father     of the gallbladder  . Heart disease Maternal Grandmother   . Heart disease Paternal Grandmother   . Diabetes Brother    History  Sexual Activity  . Sexual activity: No    Outpatient Encounter Prescriptions as of 10/01/2016  Medication Sig  . ACCU-CHEK FASTCLIX LANCETS MISC TEST FOUR TIMES DAILY AS DIRECTED  . ACCU-CHEK SMARTVIEW test strip USE TO TEST BLOOD SUGAR 2 TIMES DAILY AS INSTRUCTED.  .Marland Kitchenacetaminophen (TYLENOL) 325 MG tablet  Take 2 tablets (650 mg total) by mouth every 6 (six) hours as needed.  Marland Kitchen albuterol (VENTOLIN HFA) 108 (90 Base) MCG/ACT inhaler Inhale 2 puffs into the lungs every 6 (six) hours as needed for wheezing or shortness of breath. Reported on 12/30/2015  . aspirin 81 MG tablet Take 81 mg by mouth daily.  . Blood Glucose Monitoring Suppl (ACCU-CHEK NANO SMARTVIEW) w/Device KIT Check blood sugars 1-2 times a day.  . cetirizine (ZYRTEC) 10 MG tablet Take 10 mg by mouth daily as needed for allergies.  . fluconazole (DIFLUCAN) 150 MG tablet Take 1 tab today then 1 tab on wednesday  . glipiZIDE (GLIPIZIDE XL) 5 MG 24 hr tablet Take 1 tablet (5 mg total) by mouth daily with breakfast.  . hydrochlorothiazide (HYDRODIURIL) 25 MG tablet TAKE 1 TABLET EVERY DAY  . lisinopril (PRINIVIL,ZESTRIL) 10 MG tablet Take 1 tablet (10 mg total) by  mouth daily.  . nitroGLYCERIN (NITROSTAT) 0.4 MG SL tablet Place 1 tablet (0.4 mg total) under the tongue every 5 (five) minutes as needed for chest pain.  Marland Kitchen omeprazole (PRILOSEC) 20 MG capsule TAKE 1 CAPSULE EVERY DAY  . terconazole (TERAZOL 3) 0.8 % vaginal cream Apply to affected vaginal areas once daily  . ezetimibe (ZETIA) 10 MG tablet Take 1 tablet (10 mg total) by mouth daily.   No facility-administered encounter medications on file as of 10/01/2016.     Activities of Daily Living In your present state of health, do you have any difficulty performing the following activities: 10/01/2016 12/29/2015  Hearing? Y N  Vision? Y N  Difficulty concentrating or making decisions? N N  Walking or climbing stairs? Y Y  Dressing or bathing? N N  Doing errands, shopping? Y -  Conservation officer, nature and eating ? N -  Using the Toilet? N -  In the past six months, have you accidently leaked urine? N -  Do you have problems with loss of bowel control? N -  Managing your Medications? N -  Managing your Finances? N -  Housekeeping or managing your Housekeeping? N -  Some recent data might be hidden    Patient Care Team: Abner Greenspan, MD as PCP - General    Assessment:     Hearing Screening   '125Hz'$  '250Hz'$  '500Hz'$  '1000Hz'$  '2000Hz'$  '3000Hz'$  '4000Hz'$  '6000Hz'$  '8000Hz'$   Right ear:   0 0 0  0    Left ear:   0 0 0  0    Vision Screening Comments: Last vision exam in Sept 2017   Exercise Activities and Dietary recommendations Current Exercise Habits: The patient does not participate in regular exercise at present, Exercise limited by: None identified  Goals    . safety          Starting 10/01/2016, I will continue to use cane or walker daily as needed to reduce risk of falls.       Fall Risk Fall Risk  10/01/2016 12/30/2015 06/14/2015 03/03/2013  Falls in the past year? Yes Yes Yes No  Number falls in past yr: '1 1 1 '$ -  Injury with Fall? Yes Yes - -  Risk for fall due to : Impaired balance/gait;Impaired  mobility;Impaired vision - - -   Depression Screen PHQ 2/9 Scores 10/01/2016 12/30/2015 06/14/2015 03/03/2013  PHQ - 2 Score 0 0 0 0     Cognitive Function MMSE - Mini Mental State Exam 10/01/2016  Orientation to time 5  Orientation to Place 5  Registration 3  Attention/ Calculation  0  Recall 3  Language- name 2 objects 0  Language- repeat 1  Language- follow 3 step command 3  Language- read & follow direction 0  Write a sentence 0  Copy design 0  Total score 20     PLEASE NOTE: A Mini-Cog screen was completed. Maximum score is 20. A value of 0 denotes this part of Folstein MMSE was not completed or the patient failed this part of the Mini-Cog screening.   Mini-Cog Screening Orientation to Time - Max 5 pts Orientation to Place - Max 5 pts Registration - Max 3 pts Recall - Max 3 pts Language Repeat - Max 1 pts Language Follow 3 Step Command - Max 3 pts     Immunization History  Administered Date(s) Administered  . Influenza Split 05/02/2011  . Influenza Whole 08/20/2004, 09/18/2007, 06/21/2008, 06/22/2009, 06/19/2010  . Influenza,inj,Quad PF,36+ Mos 07/10/2013, 06/03/2015, 05/09/2016  . Pneumococcal Conjugate-13 06/14/2015  . Pneumococcal Polysaccharide-23 06/15/2003, 04/20/2008  . Td 07/06/2003  . Zoster 06/12/2014   Screening Tests Health Maintenance  Topic Date Due  . FOOT EXAM  06/12/2017 (Originally 06/13/2016)  . TETANUS/TDAP  07/05/2023 (Originally 07/05/2013)  . COLONOSCOPY  11/18/2016  . HEMOGLOBIN A1C  01/03/2017  . MAMMOGRAM  05/11/2017  . OPHTHALMOLOGY EXAM  05/14/2017  . INFLUENZA VACCINE  Addressed  . DEXA SCAN  Completed  . ZOSTAVAX  Completed  . PNA vac Low Risk Adult  Completed      Plan:     I have personally reviewed and addressed the Medicare Annual Wellness questionnaire and have noted the following in the patient's chart:  A. Medical and social history B. Use of alcohol, tobacco or illicit drugs  C. Current medications and  supplements D. Functional ability and status E.  Nutritional status F.  Physical activity G. Advance directives H. List of other physicians I.  Hospitalizations, surgeries, and ER visits in previous 12 months J.  [place] to include hearing, vision, cognitive, depression L. Referrals and appointments - none  In addition, I have reviewed and discussed with patient certain preventive protocols, quality metrics, and best practice recommendations. A written personalized care plan for preventive services as well as general preventive health recommendations were provided to patient.  See attached scanned questionnaire for additional information.   Signed,   Lindell Noe, MHA, BS, LPN Health Coach

## 2016-10-01 NOTE — Progress Notes (Signed)
Pre visit review using our clinic review tool, if applicable. No additional management support is needed unless otherwise documented below in the visit note. 

## 2016-10-23 ENCOUNTER — Encounter: Payer: Self-pay | Admitting: Family Medicine

## 2016-10-23 ENCOUNTER — Encounter (INDEPENDENT_AMBULATORY_CARE_PROVIDER_SITE_OTHER): Payer: Self-pay

## 2016-10-23 ENCOUNTER — Ambulatory Visit (INDEPENDENT_AMBULATORY_CARE_PROVIDER_SITE_OTHER): Payer: Medicare HMO | Admitting: Family Medicine

## 2016-10-23 VITALS — BP 124/68 | HR 71 | Temp 97.6°F | Ht 60.5 in | Wt 174.0 lb

## 2016-10-23 DIAGNOSIS — E6609 Other obesity due to excess calories: Secondary | ICD-10-CM | POA: Diagnosis not present

## 2016-10-23 DIAGNOSIS — Z6833 Body mass index (BMI) 33.0-33.9, adult: Secondary | ICD-10-CM

## 2016-10-23 DIAGNOSIS — E1159 Type 2 diabetes mellitus with other circulatory complications: Secondary | ICD-10-CM | POA: Diagnosis not present

## 2016-10-23 DIAGNOSIS — I10 Essential (primary) hypertension: Secondary | ICD-10-CM | POA: Diagnosis not present

## 2016-10-23 DIAGNOSIS — Z0000 Encounter for general adult medical examination without abnormal findings: Secondary | ICD-10-CM

## 2016-10-23 DIAGNOSIS — E7800 Pure hypercholesterolemia, unspecified: Secondary | ICD-10-CM

## 2016-10-23 MED ORDER — OMEPRAZOLE 20 MG PO CPDR
20.0000 mg | DELAYED_RELEASE_CAPSULE | Freq: Every day | ORAL | 3 refills | Status: DC
Start: 2016-10-23 — End: 2018-01-25

## 2016-10-23 NOTE — Patient Instructions (Addendum)
I want you to look into a chair exercise program - computer or DVD We will sign you up for the cologuard program for colon cancer screening  Get as much help caring for your husband as you can from family or friends (you need help)  For cholesterol    Avoid red meat/ fried foods/ egg yolks/ fatty breakfast meats/ butter, cheese and high fat dairy/ and shellfish   Try to get time to take care of yourself

## 2016-10-23 NOTE — Progress Notes (Signed)
Subjective:    Patient ID: Erin Beck, female    DOB: 1940-11-16, 75 y.o.   MRN: [id number]  HPI Here for health maintenance exam and to review chronic medical problems    Had AMW visit last month- reviewed  Hearing failed - both ears - she disagrees and says her hearing is fine/ declines further eval or treatment  Hx of a fall with injury Disc fall precautions in detail- using her cane /has a walker - is doing better -thinks the metformin caused her fall   Wt Readings from Last 3 Encounters:  10/23/16 174 lb (78.9 kg)  10/01/16 172 lb 8 oz (78.2 kg)  09/17/16 176 lb (79.8 kg)  diet - is about the same / sticks to a diabetic diet  Cannot exercise much due to her knee problem (knee replacement went bad)  Also chronic shoulder pain s/p surgery  bmi is 33.4  Mammogram 9/17 neg Self breast exam -no lumps or changes   Pap/gyn care : nl pap 04, she has been using monistat for a yeast infection (almost resolved)   Zoster vaccine 10/15 Tetanus vaccine 10/16  Colonoscopy/ screening : colonoscopy 4/08  Voices caregiver stress - taking care of chronically ill husband and she worries about him  Tearful today  He is difficult to take care of  No abuse but he does get angry/hateful   dexa 11/16- BMD in the normal range   DM2 Lab Results  Component Value Date   HGBA1C 6.2 07/06/2016  glucose at home 80s-120s - very good control  She occ has low glucose at night  Here was 162    bp is stable today  No cp or palpitations or headaches or edema  No side effects to medicines  BP Readings from Last 3 Encounters:  10/23/16 124/68  10/01/16 122/70  09/17/16 116/68     Hx of hyperlipidemia Lab Results  Component Value Date   CHOL 232 (H) 10/01/2016   CHOL 225 (H) 05/16/2016   CHOL 223 (H) 06/10/2015   Lab Results  Component Value Date   HDL 44.60 10/01/2016   HDL 38 (L) 05/16/2016   HDL 36.50 (L) 06/10/2015   Lab Results  Component Value Date   LDLCALC 154 (H)  10/01/2016   LDLCALC 141 (H) 05/16/2016   LDLCALC 127 (H) 03/09/2014   Lab Results  Component Value Date   TRIG 170.0 (H) 10/01/2016   TRIG 228 (H) 05/16/2016   TRIG 268.0 (H) 06/10/2015   Lab Results  Component Value Date   CHOLHDL 5 10/01/2016   CHOLHDL 5.9 (H) 05/16/2016   CHOLHDL 6 06/10/2015   Lab Results  Component Value Date   LDLDIRECT 154.0 06/10/2015   LDLDIRECT 153.7 06/08/2014   LDLDIRECT 161.8 10/05/2013   Cannot take any statins at all  [employer]  Trig and HDL improved   Results for orders placed or performed in visit on 10/01/16  CBC with Differential/Platelet  Result Value Ref Range   WBC 8.6 4.0 - 10.5 K/uL   RBC 4.94 3.87 - 5.11 Mil/uL   Hemoglobin 15.1 (H) 12.0 - 15.0 g/dL   HCT 44.5 36.0 - 46.0 %   MCV 90.1 78.0 - 100.0 fl   MCHC 34.0 30.0 - 36.0 g/dL   RDW 13.0 11.5 - 15.5 %   Platelets 264.0 150.0 - 400.0 K/uL   Neutrophils Relative % 73.8 43.0 - 77.0 %   Lymphocytes Relative 16.0 12.0 - 46.0 %   Monocytes Relative 6.3  3.0 - 12.0 %   Eosinophils Relative 3.4 0.0 - 5.0 %   Basophils Relative 0.5 0.0 - 3.0 %   Neutro Abs 6.4 1.4 - 7.7 K/uL   Lymphs Abs 1.4 0.7 - 4.0 K/uL   Monocytes Absolute 0.5 0.1 - 1.0 K/uL   Eosinophils Absolute 0.3 0.0 - 0.7 K/uL   Basophils Absolute 0.0 0.0 - 0.1 K/uL  Comprehensive metabolic panel  Result Value Ref Range   Sodium 136 135 - 145 mEq/L   Potassium 3.8 3.5 - 5.1 mEq/L   Chloride 102 96 - 112 mEq/L   CO2 30 19 - 32 mEq/L   Glucose, Bld 162 (H) 70 - 99 mg/dL   BUN 21 6 - 23 mg/dL   Creatinine, Ser 0.92 0.40 - 1.20 mg/dL   Total Bilirubin 0.5 0.2 - 1.2 mg/dL   Alkaline Phosphatase 62 39 - 117 U/L   AST 24 0 - 37 U/L   ALT 34 0 - 35 U/L   Total Protein 7.2 6.0 - 8.3 g/dL   Albumin 4.2 3.5 - 5.2 g/dL   Calcium 9.4 8.4 - 10.5 mg/dL   GFR 63.18 >60.00 mL/min  Lipid panel  Result Value Ref Range   Cholesterol 232 (H) 0 - 200 mg/dL   Triglycerides 170.0 (H) 0.0 - 149.0 mg/dL   HDL 44.60 >39.00 mg/dL    VLDL 34.0 0.0 - 40.0 mg/dL   LDL Cholesterol 154 (H) 0 - 99 mg/dL   Total CHOL/HDL Ratio 5    NonHDL 187.68   TSH  Result Value Ref Range   TSH 1.55 0.35 - 4.50 uIU/mL   rest of labs are fairly stable   Patient Active Problem List   Diagnosis Date Noted  . Non-cardiac chest pain 05/23/2016  . CAD (coronary artery disease), native coronary artery 05/23/2016  . LLQ abdominal pain 04/30/2016  . Abnormal stress test 12/29/2015  . Type 2 diabetes mellitus with circulatory disorder, without long-term current use of insulin ([place]) 06/17/2015  . Routine general medical examination at a health care facility 06/14/2015  . Hearing loss of aging 06/14/2015  . 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 Chronic neck and back pain 06/15/2014  . Right shoulder pain 03/12/2014  . Vitreomacular adhesion 05/05/2013  . Unspecified asthma(493.90) 04/23/2013  . Personal history of colonic polyps 03/03/2013  . Post-menopausal 10/17/2012  . Obesity 10/31/2011  . IBS (irritable bowel syndrome) 08/10/2011  . GERD 07/31/2010  . Allergic rhinitis 12/05/2007  . HYPERTENSION, BENIGN ESSENTIAL 02/05/2007  . TOBACCO USE, QUIT 02/05/2007  . Hyperlipidemia 01/31/2007  . ANXIETY 01/31/2007  . OSTEOARTHRITIS 01/31/2007  . DIVERTICULITIS, HX OF 01/31/2007   Past Medical History:  Diagnosis Date  . Allergic rhinitis   . Anxiety   . Arthritis    OA  . Asthma   . CAD (coronary artery disease), native coronary artery 05/23/2016   Non obstructive with 40% mid LAD and 60% ramus  . Complication of anesthesia    gas bubble rt eye from an old retinal detachment  . Diabetes mellitus    type II  . Diverticulitis    Hx of  . Full dentures   . GERD (gastroesophageal reflux disease)   . Hematuria    HX OF MICROSCOPIC BLOOD IN URINE AND HX OF CYST ON ONE KIDNEY--BUT NO KIDNEY DISEASE  . Hyperlipidemia   . Hypertension   . IBS (irritable bowel syndrome)   . Osteopenia   . Wears glasses    Past Surgical History:  Procedure Laterality Date  . ABDOMINAL HYSTERECTOMY    . APPENDECTOMY     per pt  . CARDIAC CATHETERIZATION N/A 12/29/2015   Procedure: Left Heart Cath and Coronary Angiography;  Surgeon: Belva Crome, MD;  Location: [facility];  Service: Cardiovascular;  Laterality: N/A;  . CARPAL TUNNEL RELEASE     both hands  . CHOLECYSTECTOMY    . COLONOSCOPY    . EYE SURGERY     BILATERAL CATARACTS REMOVED  . EYE SURGERY     gas bubble in rt eye from a retinal detachment  . JOINT REPLACEMENT  2000   LEFT TOTAL KNEE ARTHROPLASTY  . JOINT REPLACEMENT  2013   rt total knee  . KNEE ARTHROSCOPY  1990   chondromalacia   . TOTAL KNEE ARTHROPLASTY  12/05/2011   Procedure: TOTAL KNEE ARTHROPLASTY;  Surgeon: Magnus Sinning, MD;  Location: [facility];  Service: Orthopedics;  Laterality: Right;  . ULNAR NERVE TRANSPOSITION Right 09/15/2014   Procedure: Right cubital tunnel release with transposition of ulnar nerve;  Surgeon: Marianna Payment, MD;  Location: [facility];  Service: Orthopedics;  Laterality: Right;   Social History  Substance Use Topics  . Smoking status: Former Smoker    Packs/day: 1.00    Years: 48.00    Quit date: 08/20/2009  . Smokeless tobacco: Never Used  . Alcohol use No   Family History  Problem Relation Age of Onset  . Pneumonia Mother   . Diabetes Father   . Cancer Father     of the gallbladder  . Heart disease Maternal Grandmother   . Heart disease Paternal Grandmother   . Diabetes Brother    Allergies  Allergen Reactions  . Naproxen Rash    Trouble breathing  . Naproxen Sodium Rash    Trouble breathing  . Aspirin     REACTION: burns stomach PT STATES SHE DOES TOLERATE THE 81 MG ASPIRIN DAILY  . Atorvastatin     REACTION: muscle pain  . Buspar [Buspirone Hcl]     Multiple side eff  . Gabapentin Other (See Comments)    Palpitations/ dizziness   . Metformin And Related     Mm aches, palpitations, SOB, worse eyesight, loss of balance,  sweating, low CBGs  . Norco [Hydrocodone-Acetaminophen] Other (See Comments)    Dizzy and problems remembering   . Pravastatin     Leg pain  . Simvastatin     REACTION: GI side eff and swelling  . Sulfa Antibiotics Other (See Comments)    Severe joint pain  . Sulfonamide Derivatives     REACTION: reaction not known   Current Outpatient Prescriptions on File Prior to Visit  Medication Sig Dispense Refill  . ACCU-CHEK FASTCLIX LANCETS MISC TEST FOUR TIMES DAILY AS DIRECTED 400 each 3  . ACCU-CHEK SMARTVIEW test strip USE TO TEST BLOOD SUGAR 2 TIMES DAILY AS INSTRUCTED. 200 each 3  . acetaminophen (TYLENOL) 325 MG tablet Take 2 tablets (650 mg total) by mouth every 6 (six) hours as needed.    Marland Kitchen albuterol (VENTOLIN HFA) 108 (90 Base) MCG/ACT inhaler Inhale 2 puffs into the lungs every 6 (six) hours as needed for wheezing or shortness of breath. Reported on 12/30/2015    . aspirin 81 MG tablet Take 81 mg by mouth daily.    . Blood Glucose Monitoring Suppl (ACCU-CHEK NANO SMARTVIEW) w/Device KIT Check blood sugars 1-2 times a day. 1 kit 0  . cetirizine (ZYRTEC) 10 MG tablet  Take 10 mg by mouth daily as needed for allergies.    Marland Kitchen glipiZIDE (GLIPIZIDE XL) 5 MG 24 hr tablet Take 1 tablet (5 mg total) by mouth daily with breakfast. 90 tablet 3  . hydrochlorothiazide (HYDRODIURIL) 25 MG tablet TAKE 1 TABLET EVERY DAY 90 tablet 1  . lisinopril (PRINIVIL,ZESTRIL) 10 MG tablet Take 1 tablet (10 mg total) by mouth daily. 90 tablet 3  . nitroGLYCERIN (NITROSTAT) 0.4 MG SL tablet Place 1 tablet (0.4 mg total) under the tongue every 5 (five) minutes as needed for chest pain. 25 tablet 3  . terconazole (TERAZOL 3) 0.8 % vaginal cream Apply to affected vaginal areas once daily 20 g 0  . ezetimibe (ZETIA) 10 MG tablet Take 1 tablet (10 mg total) by mouth daily. 90 tablet 3   No current facility-administered medications on file prior to visit.     Review of Systems Review of Systems  Constitutional:  Negative for fever, appetite change, fatigue and unexpected weight change.  Eyes: Negative for pain and visual disturbance.  Respiratory: Negative for cough and shortness of breath.   Cardiovascular: Negative for cp or palpitations    Gastrointestinal: Negative for nausea, diarrhea and constipation.  Genitourinary: Negative for urgency and frequency.  Skin: Negative for pallor or rash   MSK pos for joint stiffness from arthritis  Neurological: Negative for weakness, light-headedness, numbness and headaches. (neg for falls currently) Hematological: Negative for adenopathy. Does not bruise/bleed easily.  Psychiatric/Behavioral: Negative for dysphoric mood. The patient is not nervous/anxious.  (improved mood)       Objective:   Physical Exam  Constitutional: She appears well-developed and well-nourished. No distress.  obese and well appearing   HENT:  Head: Normocephalic and atraumatic.  Right Ear: External ear normal.  Left Ear: External ear normal.  Mouth/Throat: Oropharynx is clear and moist.  Eyes: Conjunctivae and EOM are normal. Pupils are equal, round, and reactive to light. No scleral icterus.  Neck: Normal range of motion. Neck supple. No JVD present. Carotid bruit is not present. No thyromegaly present.  Cardiovascular: Normal rate, regular rhythm, normal heart sounds and intact distal pulses.  Exam reveals no gallop.   Pulmonary/Chest: Effort normal and breath sounds normal. No respiratory distress. She has no wheezes. She exhibits no tenderness.  bs are distant  Abdominal: Soft. Bowel sounds are normal. She exhibits no distension, no abdominal bruit and no mass. There is no tenderness.  Genitourinary: No breast swelling, tenderness, discharge or bleeding.  Genitourinary Comments: Breast exam: No mass, nodules, thickening, tenderness, bulging, retraction, inflamation, nipple discharge or skin changes noted.  No axillary or clavicular LA.      Musculoskeletal: Normal range of  motion. She exhibits no edema or tenderness.  No kyphosis   Lymphadenopathy:    She has no cervical adenopathy.  Neurological: She is alert. She has normal reflexes. No cranial nerve deficit. She exhibits normal muscle tone. Coordination normal.  Skin: Skin is warm and dry. No rash noted. No erythema. No pallor.  Lentigines and SKs   Psychiatric: She has a normal mood and affect.          Assessment & Plan:   Problem List Items Addressed This Visit      Cardiovascular and Mediastinum   HYPERTENSION, BENIGN ESSENTIAL    bp in fair control at this time  BP Readings from Last 1 Encounters:  10/23/16 124/68   No changes needed Disc lifstyle change with low sodium diet and exercise  Labs reviewed  Type 2 diabetes mellitus with circulatory disorder, without long-term current use of insulin (HCC) - Primary (Chronic)    Sees Dr Cruzita Lederer for management Doing well Lab Results  Component Value Date   HGBA1C 6.2 07/06/2016   Disc foot and eye care        Other   Hyperlipidemia    Disc goals for lipids and reasons to control them Rev labs with pt Rev low sat fat diet in detail She cannot tolerate statins unfortunately  Declines any treatment  Info given lon low sat/trans fat diet       Obesity    Discussed how this problem influences overall health and the risks it imposes  Reviewed plan for weight loss with lower calorie diet (via better food choices and also portion control or program like weight watchers) and exercise building up to or more than 30 minutes 5 days per week including some aerobic activity         Routine general medical examination at a health care facility    Reviewed health habits including diet and exercise and skin cancer prevention Reviewed appropriate screening tests for age  Also reviewed health mt list, fam hx and immunization status , as well as social and family history    She declines further eval or tx of hearing loss  Ordered  cologuard test for screening  Cannot take statins for cholesterol- diet rev

## 2016-10-23 NOTE — Progress Notes (Signed)
Pre visit review using our clinic review tool, if applicable. No additional management support is needed unless otherwise documented below in the visit note. 

## 2016-10-24 NOTE — Assessment & Plan Note (Signed)
Sees Dr Cruzita Lederer for management Doing well Lab Results  Component Value Date   HGBA1C 6.2 07/06/2016   Disc foot and eye care

## 2016-10-24 NOTE — Assessment & Plan Note (Signed)
Reviewed health habits including diet and exercise and skin cancer prevention Reviewed appropriate screening tests for age  Also reviewed health mt list, fam hx and immunization status , as well as social and family history    She declines further eval or tx of hearing loss  Ordered cologuard test for screening  Cannot take statins for cholesterol- diet rev

## 2016-10-24 NOTE — Assessment & Plan Note (Signed)
Discussed how this problem influences overall health and the risks it imposes  Reviewed plan for weight loss with lower calorie diet (via better food choices and also portion control or program like weight watchers) and exercise building up to or more than 30 minutes 5 days per week including some aerobic activity    

## 2016-10-24 NOTE — Assessment & Plan Note (Signed)
bp in fair control at this time  BP Readings from Last 1 Encounters:  10/23/16 124/68   No changes needed Disc lifstyle change with low sodium diet and exercise  Labs reviewed

## 2016-10-24 NOTE — Assessment & Plan Note (Signed)
Disc goals for lipids and reasons to control them Rev labs with pt Rev low sat fat diet in detail She cannot tolerate statins unfortunately  Declines any treatment  Info given lon low sat/trans fat diet

## 2016-11-02 ENCOUNTER — Encounter: Payer: Self-pay | Admitting: Internal Medicine

## 2016-11-02 ENCOUNTER — Ambulatory Visit (INDEPENDENT_AMBULATORY_CARE_PROVIDER_SITE_OTHER): Payer: Medicare HMO | Admitting: Internal Medicine

## 2016-11-02 VITALS — BP 120/70 | HR 73 | Ht 61.0 in | Wt 175.0 lb

## 2016-11-02 DIAGNOSIS — E1159 Type 2 diabetes mellitus with other circulatory complications: Secondary | ICD-10-CM | POA: Diagnosis not present

## 2016-11-02 LAB — POCT GLYCOSYLATED HEMOGLOBIN (HGB A1C): HEMOGLOBIN A1C: 6.3

## 2016-11-02 NOTE — Progress Notes (Signed)
Patient ID: [name], female   DOB: 03/04/1941, 76 y.o.   MRN: [id number]  HPI: Erin Beck is a 76 y.o.-year-old female, returning for f/u for DM2, non-insulin-dependent, dx 2005, uncontrolled, with complications (CAD). Last visit 4 mo ago.  Last hemoglobin A1c was: Lab Results  Component Value Date   HGBA1C 6.2 07/06/2016   HGBA1C 6.8 (H) 03/27/2016   HGBA1C 6.4 01/06/2016  Labs from Dr. Erlinda Hong [address], 08/30/2014): HbA1c 6.9%, decreased from 8.0%  Pt is on a regimen of: - Glipizide XL 5 >> 2.5 >> 5 mg in am Stopped Metformin ER 1500 mg in pm before cath but she was not feeling well on it (diarrhea, weakness, thirst, leg swelling, blurry vision) She has not been on insulin before, would not like to stick herself.  Pt checks her sugars 2-3 a day: - am: 91-140 >> 101-130 >> 94-120 >> 80-119 >> 90-153 >> 92-143 >> 89, 92-134 >> 107-127 - 2h after b'fast: 89, 184, 231 >> 127-152 >> 99-144 >> 112-140 >> 111-158 >> 110-139 >>  118-144 - before lunch: 97-152 >> 79, 87-136 >> 87-127 >> 107-138, 151 >>73,  90-138 >> 68x1, 83-125 >> 80-134 - 2h after lunch: 114-150 >> 91-149, 163 >> 117-147 >> 74, 80-152, 172 >> 76, 111-140 >> 93-154 - before dinner:81-102 >> 91-137, 151, 171 >> 108-134, 182 >> 86-135 >> 82-128 >> 84, 105-136 - 2h after dinner: 103-190 >> 121-177 >> 95-162 >> 130-182, 236 >> 96-161 >> 95-151 > 121-150 - bedtime: 81, 84-122 >> 80-133 >> 80-126 >> 123-157 >> 106-148, 157 >> 92-134 >> 99-125 - nighttime: 88-115 >> 83-111 >> 75s (not in last mo) >> 115-164 >> 134 >> 111-134 >> 99-138 No lows. Lowest sugar was 74 >> 68x1 >> 80;  she has hypoglycemia awareness at 80.  Highest sugar was 170s >> 151 >> 155 now  Pt's meals are: - Breakfast: cereals - Lunch: salads, 1 sandwich, leftovers - Dinner: salmon/chicken, vegetables - Snacks: 1 fruit, crackers - not every day No soft drinks.  - no CKD, last BUN/creatinine:  Lab Results  Component Value Date   BUN 21 10/01/2016    CREATININE 0.92 10/01/2016  On Lisinopril. - last set of lipids: Lab Results  Component Value Date   CHOL 232 (H) 10/01/2016   HDL 44.60 10/01/2016   LDLCALC 154 (H) 10/01/2016   LDLDIRECT 154.0 06/10/2015   TRIG 170.0 (H) 10/01/2016   CHOLHDL 5 10/01/2016  She was on a statin >> AP.  - last eye exam was in 04/2016 No DR. Had cataracts sx. - no numbness and tingling in her feet.  I reviewed pt's medications, allergies, PMH, social hx, family hx, and changes were documented in the history of present illness. Otherwise, unchanged from my initial visit note.  ROS: Constitutional: no weight gain, no fatigue, no subjective hyperthermia/hypothermia Eyes: no blurry vision, no xerophthalmia ENT: no sore throat, no nodules palpated in throat, no dysphagia/odynophagia, no hoarseness, + decreased hearing Cardiovascular: no CP/SOB/palpitations/leg swelling Respiratory: no cough/SOB Gastrointestinal: no N/V/D/C Musculoskeletal: no muscle/+ joint aches: knees, shoulder Skin: no rashes Neurological: no tremors/numbness/tingling/dizziness  PE: BP 120/70 (BP Location: Left Arm, Patient Position: Sitting)   Pulse 73   Ht 5\' 1"  (1.549 m)   Wt 175 lb (79.4 kg)   SpO2 97%   BMI 33.07 kg/m  Body mass index is 33.07 kg/m. Wt Readings from Last 3 Encounters:  11/02/16 175 lb (79.4 kg)  10/23/16 174 lb (78.9 kg)  10/01/16 172 lb  8 oz (78.2 kg)   Constitutional: overweight, in NAD Eyes: PERRLA, EOMI, no exophthalmos ENT: moist mucous membranes, no thyromegaly, no cervical lymphadenopathy Cardiovascular: RRR, No MRG Respiratory: CTA B Gastrointestinal: abdomen soft, NT, ND, BS+ Musculoskeletal: no deformities, strength intact in all 4 Skin: moist, warm, no rashes Neurological: no tremor with outstretched hands, DTR normal in all 4  ASSESSMENT: 1. DM2, non-insulin-dependent, uncontrolled, with complications - CAD  PLAN:  1. Patient with long-standing, well controlled diabetes, on oral  antidiabetic regimen, with good control. She felt much better after stopping Metformin. Now only on Glipizide XL  5 mg. She keeps a great sugar log >> sugars are at or close to goal. No lows. - Reviewed last HbA1>> 6.2% (Better!) >> today: 6.3% (stable, great) - Will continue with only Glipizide XL. - I suggested to:  Patient Instructions  Please continue - Glipizide XL 5 mg in am.  Please return in 4 months with your sugar log.   - continue checking sugars at different times of the day - check 1-2 times a day, rotating checks - advised for yearly eye exams >> she is UTD - She is statin intolerant. Lipids managed by cardiology. - Return to clinic in 4 mo with sugar log   Philemon Kingdom, MD PhD [facility]

## 2016-11-02 NOTE — Patient Instructions (Signed)
Patient Instructions  Please continue - Glipizide XL 5 mg in am.  Please return in 4 months with your sugar log.

## 2016-11-02 NOTE — Addendum Note (Signed)
Addended by: Caprice Beaver T on: 11/02/2016 11:24 AM   Modules accepted: Orders

## 2016-11-06 ENCOUNTER — Telehealth: Payer: Self-pay

## 2016-11-06 NOTE — Telephone Encounter (Signed)
Pt left v/m; pt did colonoscopy test and pt was notified that the test did not come out right; pt was told that if have any infection colonoscopy test results will not be correct. Pt had yeast infection when having colonoscopy but pt wanted FYI to Dr Glori Bickers that pt is not going to take colonoscopy again.

## 2016-11-06 NOTE — Telephone Encounter (Signed)
That is fine-thanks for letting me know  We can discuss it again at her next visit if she changes her mind

## 2016-11-06 NOTE — Telephone Encounter (Signed)
Please call the cologuard number (I assume she means cologuard and not colonoscopy) to see if a yeast infection could cause a false positive-thanks

## 2016-11-06 NOTE — Telephone Encounter (Signed)
Called the cologuard company and they advised me that yeast inf is not a contraindication that would cause a false positive. They said some of the situations that could cause a false positive are false positives during another type of screening test, IBD, or Crohn's ds, the pt already has another type of cancer, and other inflammatory ds of the colon but yeast inf isn't one of them on her list

## 2016-11-06 NOTE — Telephone Encounter (Signed)
It wasn't a false positive they were not able to process her cologuard at all they were contacting pt to let her know she needs to do another one and she was letting us know she declined to do a 2nd one

## 2016-11-06 NOTE — Telephone Encounter (Signed)
I do not think she has any of those conditions   (she has IBS but not IBD) Please let her know this  Thanks

## 2016-11-15 ENCOUNTER — Other Ambulatory Visit: Payer: Self-pay | Admitting: Family Medicine

## 2016-11-21 ENCOUNTER — Telehealth: Payer: Self-pay

## 2016-11-21 NOTE — Telephone Encounter (Signed)
Pt request omeprazole to [facility]. Advised pt to contact [facility] because refills already done on 10/23/16. Pt will ck with pharmacy.

## 2017-01-15 ENCOUNTER — Telehealth: Payer: Self-pay | Admitting: Internal Medicine

## 2017-01-15 ENCOUNTER — Other Ambulatory Visit: Payer: Self-pay

## 2017-01-15 DIAGNOSIS — E1159 Type 2 diabetes mellitus with other circulatory complications: Secondary | ICD-10-CM

## 2017-01-15 DIAGNOSIS — Z794 Long term (current) use of insulin: Principal | ICD-10-CM

## 2017-01-15 MED ORDER — ACCU-CHEK FASTCLIX LANCETS MISC: 3 refills | Status: DC

## 2017-01-15 MED ORDER — GLUCOSE BLOOD VI STRP: ORAL_STRIP | 3 refills | Status: DC

## 2017-01-15 MED ORDER — ACCU-CHEK NANO SMARTVIEW W/DEVICE KIT: PACK | 0 refills | Status: DC

## 2017-01-15 NOTE — Telephone Encounter (Signed)
Submitted meter, test strips, and lancets just so patient would have enough.

## 2017-01-15 NOTE — Telephone Encounter (Signed)
Patient called to request a new blood glucose monitor- accucheck to be sent to [facility] mail order. She states that she needs it asap

## 2017-01-25 ENCOUNTER — Other Ambulatory Visit: Payer: Self-pay | Admitting: Internal Medicine

## 2017-02-01 ENCOUNTER — Ambulatory Visit: Payer: Medicare HMO | Admitting: Internal Medicine

## 2017-02-25 ENCOUNTER — Encounter: Payer: Self-pay | Admitting: Internal Medicine

## 2017-02-25 ENCOUNTER — Ambulatory Visit (INDEPENDENT_AMBULATORY_CARE_PROVIDER_SITE_OTHER): Payer: Medicare HMO | Admitting: Internal Medicine

## 2017-02-25 VITALS — BP 120/74 | HR 71 | Ht 61.0 in | Wt 175.0 lb

## 2017-02-25 DIAGNOSIS — E1159 Type 2 diabetes mellitus with other circulatory complications: Secondary | ICD-10-CM

## 2017-02-25 LAB — POCT GLYCOSYLATED HEMOGLOBIN (HGB A1C): HEMOGLOBIN A1C: 6.7

## 2017-02-25 MED ORDER — GLIPIZIDE ER 5 MG PO TB24: 5.0000 mg | ORAL_TABLET | Freq: Every day | ORAL | 3 refills | Status: DC

## 2017-02-25 NOTE — Patient Instructions (Signed)
Please continue - Glipizide XL 5 mg in am.  Please return in 4 months with your sugar log.

## 2017-02-25 NOTE — Progress Notes (Signed)
Patient ID: Erin Beck, female   DOB: May 20, 1941, 76 y.o.   MRN: [id number]  HPI: Erin Beck is a 76 y.o.-year-old female, returning for f/u for DM2, non-insulin-dependent, dx 2005, uncontrolled, with complications (CAD). Last visit 4 mo ago.  Last hemoglobin A1c was: Lab Results  Component Value Date   HGBA1C 6.3 11/02/2016   HGBA1C 6.2 07/06/2016   HGBA1C 6.8 (H) 03/27/2016  Labs from Dr. Erlinda Hong [address], 08/30/2014): HbA1c 6.9%, decreased from 8.0%  Pt is on a regimen of: - Glipizide XL 5 mg in am Stopped Metformin ER 1500 mg in pm before cath but she was not feeling well on it (diarrhea, weakness, thirst, leg swelling, blurry vision) She has not been on insulin before, would not like to stick herself.  Pt checks her sugars 2-3x a day: - am: 80-119 >> 90-153 >> 92-143 >> 89, 92-134 >> 107-127 >> 108-138 - 2h after b'fast:  112-140 >> 111-158 >> 110-139 >>  118-144 >> 97, 154 - before lunch: 107-138, 151 >>73,  90-138 >> 68x1, 83-125 >> 80-134 >> 82-138, 155 - 2h after lunch: 117-147 >> 74, 80-152, 172 >> 76, 111-140 >> 93-154 >> n/c - before dinner: 108-134, 182 >> 86-135 >> 82-128 >> 84, 105-136 >> 80-131 - 2h after dinner: 130-182, 236 >> 96-161 >> 95-151 > 121-150 >> 110-140, 168 - bedtime: 80-126 >> 123-157 >> 106-148, 157 >> 92-134 >> 99-125 >> 132 - nighttime: 75s (not in last mo) >> 115-164 >> 134 >> 111-134 >> 99-138 >> 114-122 No lows. Lowest sugar was 74 >> 68x1 >> 80 >> 80;  she has hypoglycemia awareness at 80.  Highest sugar was 170s >> 151 >> 155 >> 155.  Pt's meals are: - Breakfast: cereals - Lunch: salads, 1 sandwich, leftovers - Dinner: salmon/chicken, vegetables - Snacks: 1 fruit, crackers - not every day No soft drinks.  - No CKD, last BUN/creatinine:  Lab Results  Component Value Date   BUN 21 10/01/2016   CREATININE 0.92 10/01/2016  On Lisinopril. - last set of lipids: Lab Results  Component Value Date   CHOL 232 (H) 10/01/2016   HDL 44.60  10/01/2016   LDLCALC 154 (H) 10/01/2016   LDLDIRECT 154.0 06/10/2015   TRIG 170.0 (H) 10/01/2016   CHOLHDL 5 10/01/2016  She was on a statin >> AP.  - last eye exam was in 04/2016. No DR. H/o cataract. - denies numbness and tingling in her feet.  ROS: Constitutional: no weight gain/no weight loss, no fatigue, no subjective hyperthermia, no subjective hypothermia Eyes: no blurry vision, no xerophthalmia ENT: no sore throat, no nodules palpated in throat, no dysphagia, no odynophagia, no hoarseness Cardiovascular: no CP/no SOB/no palpitations/no leg swelling Respiratory: no cough/no SOB/no wheezing Gastrointestinal: no N/no V/no D/no C/no acid reflux Musculoskeletal: no muscle aches/+ joint aches = R knee despite TKR Skin: no rashes, no hair loss Neurological: no tremors/no numbness/no tingling/no dizziness  I reviewed pt's medications, allergies, PMH, social hx, family hx, and changes were documented in the history of present illness. Otherwise, unchanged from my initial visit note.  PE: BP 120/74 (BP Location: Left Arm, Patient Position: Sitting)   Pulse 71   Ht 5\' 1"  (1.549 m)   Wt 175 lb (79.4 kg)   SpO2 94%   BMI 33.07 kg/m  Body mass index is 33.07 kg/m. Wt Readings from Last 3 Encounters:  02/25/17 175 lb (79.4 kg)  11/02/16 175 lb (79.4 kg)  10/23/16 174 lb (78.9 kg)  Constitutional: overweight, in NAD, walks with cane. Eyes: PERRLA, EOMI, no exophthalmos ENT: moist mucous membranes, no thyromegaly, no cervical lymphadenopathy Cardiovascular: RRR, No MRG Respiratory: CTA B Gastrointestinal: abdomen soft, NT, ND, BS+ Musculoskeletal: no deformities, strength intact in all 4 Skin: moist, warm, no rashes Neurological: no tremor with outstretched hands, DTR normal in all 4  ASSESSMENT: 1. DM2, non-insulin-dependent, uncontrolled, with complications - CAD  PLAN:  1. Patient with long-standing, well controlled diabetes, on oral meds. She came off Metformin and she  is now feeling much better. She is only on Glipizide XL >> sugars at or close to goal. She keeps a great sugar log >> reviewed today. Sugars are similar to before with few more spikes during her vacation [name], [place]). No lows. - Reviewed last HbA1>> 6.3% - will continue only Glipizide XL at same dose - I suggested to:  Patient Instructions  Please continue - Glipizide XL 5 mg in am  Please return in 4 months with your sugar log.   - today, HbA1c is 6.7%  - continue checking sugars at different times of the day - check 1x a day, rotating checks - advised for yearly eye exams >> she is UTD - She is statin intolerant. Lipids managed by cardiology. - Return to clinic in 4 mo with sugar log   Philemon Kingdom, MD PhD [facility]

## 2017-02-25 NOTE — Addendum Note (Signed)
Addended by: Caprice Beaver T on: 02/25/2017 11:17 AM   Modules accepted: Orders

## 2017-03-08 ENCOUNTER — Other Ambulatory Visit: Payer: Self-pay | Admitting: Cardiology

## 2017-03-08 ENCOUNTER — Telehealth: Payer: Self-pay | Admitting: Family Medicine

## 2017-03-08 ENCOUNTER — Encounter (HOSPITAL_COMMUNITY): Payer: Self-pay | Admitting: Emergency Medicine

## 2017-03-08 ENCOUNTER — Emergency Department (HOSPITAL_COMMUNITY)
Admission: EM | Admit: 2017-03-08 | Discharge: 2017-03-08 | Disposition: A | Discharge status: Home / Self Care | Payer: Medicare HMO | Attending: Emergency Medicine | Admitting: Emergency Medicine

## 2017-03-08 ENCOUNTER — Emergency Department (HOSPITAL_COMMUNITY): Payer: Medicare HMO

## 2017-03-08 DIAGNOSIS — R079 Chest pain, unspecified: Secondary | ICD-10-CM | POA: Diagnosis not present

## 2017-03-08 DIAGNOSIS — Z96653 Presence of artificial knee joint, bilateral: Secondary | ICD-10-CM | POA: Diagnosis not present

## 2017-03-08 DIAGNOSIS — I25118 Atherosclerotic heart disease of native coronary artery with other forms of angina pectoris: Secondary | ICD-10-CM | POA: Diagnosis not present

## 2017-03-08 DIAGNOSIS — Z7982 Long term (current) use of aspirin: Secondary | ICD-10-CM | POA: Insufficient documentation

## 2017-03-08 DIAGNOSIS — K219 Gastro-esophageal reflux disease without esophagitis: Secondary | ICD-10-CM | POA: Diagnosis present

## 2017-03-08 DIAGNOSIS — J45909 Unspecified asthma, uncomplicated: Secondary | ICD-10-CM | POA: Insufficient documentation

## 2017-03-08 DIAGNOSIS — Z79899 Other long term (current) drug therapy: Secondary | ICD-10-CM | POA: Insufficient documentation

## 2017-03-08 DIAGNOSIS — E1159 Type 2 diabetes mellitus with other circulatory complications: Secondary | ICD-10-CM

## 2017-03-08 DIAGNOSIS — E119 Type 2 diabetes mellitus without complications: Secondary | ICD-10-CM | POA: Diagnosis not present

## 2017-03-08 DIAGNOSIS — E785 Hyperlipidemia, unspecified: Secondary | ICD-10-CM

## 2017-03-08 DIAGNOSIS — R0789 Other chest pain: Secondary | ICD-10-CM | POA: Diagnosis not present

## 2017-03-08 DIAGNOSIS — E7800 Pure hypercholesterolemia, unspecified: Secondary | ICD-10-CM | POA: Diagnosis not present

## 2017-03-08 DIAGNOSIS — I2510 Atherosclerotic heart disease of native coronary artery without angina pectoris: Secondary | ICD-10-CM | POA: Diagnosis not present

## 2017-03-08 DIAGNOSIS — Z87891 Personal history of nicotine dependence: Secondary | ICD-10-CM | POA: Diagnosis not present

## 2017-03-08 DIAGNOSIS — I10 Essential (primary) hypertension: Secondary | ICD-10-CM | POA: Diagnosis not present

## 2017-03-08 DIAGNOSIS — E1169 Type 2 diabetes mellitus with other specified complication: Secondary | ICD-10-CM | POA: Diagnosis present

## 2017-03-08 DIAGNOSIS — E1165 Type 2 diabetes mellitus with hyperglycemia: Secondary | ICD-10-CM | POA: Diagnosis present

## 2017-03-08 LAB — BASIC METABOLIC PANEL
ANION GAP: 9 (ref 5–15)
BUN: 17 mg/dL (ref 6–20)
CALCIUM: 9.1 mg/dL (ref 8.9–10.3)
CO2: 23 mmol/L (ref 22–32)
Chloride: 101 mmol/L (ref 101–111)
Creatinine, Ser: 0.73 mg/dL (ref 0.44–1.00)
Glucose, Bld: 193 mg/dL — ABNORMAL HIGH (ref 65–99)
POTASSIUM: 4.5 mmol/L (ref 3.5–5.1)
Sodium: 133 mmol/L — ABNORMAL LOW (ref 135–145)

## 2017-03-08 LAB — CBC
HEMATOCRIT: 40.9 % (ref 36.0–46.0)
HEMOGLOBIN: 13.6 g/dL (ref 12.0–15.0)
MCH: 29.9 pg (ref 26.0–34.0)
MCHC: 33.3 g/dL (ref 30.0–36.0)
MCV: 89.9 fL (ref 78.0–100.0)
Platelets: 226 10*3/uL (ref 150–400)
RBC: 4.55 MIL/uL (ref 3.87–5.11)
RDW: 13.2 % (ref 11.5–15.5)
WBC: 5.6 10*3/uL (ref 4.0–10.5)

## 2017-03-08 LAB — TROPONIN I: Troponin I: <0.03 ng/mL (ref <0.03)

## 2017-03-08 LAB — I-STAT TROPONIN, ED: TROPONIN I, POC: 0 ng/mL (ref 0.00–0.08)

## 2017-03-08 MED ORDER — NITROGLYCERIN 2 % TD OINT
1.0000 [in_us] | TOPICAL_OINTMENT | Freq: Once | TRANSDERMAL | Status: AC
  Administered 2017-03-08: 1 [in_us] via TOPICAL
  Filled 2017-03-08: qty 1

## 2017-03-08 NOTE — Discharge Instructions (Signed)
° °  You have a Stress Test scheduled at [facility]. Your doctor has ordered this test to check the blood flow in your heart arteries.  Please arrive 15 minutes early for paperwork. The whole test will take several hours. You may want to bring reading material to remain occupied while undergoing different parts of the test.  Instructions:  No food/drink after midnight the night before.  It is OK to take your morning meds with a sip of water EXCEPT for those types of medicines listed below or otherwise instructed.  No caffeine/decaf products 24 hours before, including medicines such as Excedrin or Goody Powders. Call if there are any questions.   Wear comfortable clothes and shoes.   Special Medication Instructions:  Beta blockers such as metoprolol (Lopressor/Toprol XL), atenolol (Tenormin), carvedilol (Coreg), nebivolol (Bystolic), bisoprolol (Zebeta), propranolol (Inderal) should not be taken for 24 hours before the test.  Calcium channel blockers such as diltiazem (Cardizem) or verapmil (Calan) should not be taken for 24 hours before the test.  Remove nitroglycerin patches and do not take nitrate preparations such as Imdur/isosorbide the day of your test.  No Persantine/Theophylline or Aggrenox medicines should be used within 24 hours of the test.   If you are diabetic, please do not take your diabetes medication on the  morning of the test. Bring a snack to the test.  What To Expect: When you arrive in the lab, the technician will inject a small amount of radioactive tracer into your arm through an IV while you are resting quietly. This helps [place] to form pictures of your heart. You will likely only feel a sting from the IV. After a waiting period, resting pictures will be obtained under a big camera. These are the "before" pictures.  Next, you will be prepped for the stress portion of the test. This may include either walking on a treadmill or receiving a  medicine that helps to dilate blood vessels in your heart to simulate the effect of exercise on your heart. If you are walking on a treadmill, you will walk at different paces to try to get your heart rate to a goal number that is based on your age. If your doctor has chosen the pharmacologic test, then you will receive a medicine through your IV that may cause temporary nausea, flushing, shortness of breath and sometimes chest discomfort or vomiting. This is typically short-lived and usually resolves quickly. If you experience symptoms, that does not automatically mean the test is abnormal. Some patients do not experience any symptoms at all. Your blood pressure and heart rate will be monitored, and we will be watching your EKG on a computer screen for any changes. During this portion of the test, the radiologist will inject another small amount of radioactive tracer into your IV. After a waiting period, you will undergo a second set of pictures. These are the "after" pictures.  The doctor reading the test will compare the before-and-after images to look for evidence of heart blockages or heart weakness. The test usually takes 1 day to complete, but in certain instances (for example, if a patient is over a certain weight limit), the test may be done over the span of 2 days.

## 2017-03-08 NOTE — ED Notes (Signed)
5/10 cp

## 2017-03-08 NOTE — Telephone Encounter (Signed)
She went to ED

## 2017-03-08 NOTE — ED Notes (Signed)
Patient Alert and oriented X4. Stable and ambulatory. Patient verbalized understanding of the discharge instructions.  Patient belongings were taken by the patient. Patient and family aware of stress test for 03/12/17

## 2017-03-08 NOTE — ED Provider Notes (Signed)
[facility] DEPT Provider Note   CSN: [id number] Arrival date & time:        History   Chief Complaint Chief Complaint  Patient presents with  . Chest Pain    HPI Erin Beck is a 76 y.o. female.  The history is provided by the patient.  Chest Pain   This is a recurrent problem. Episode onset: 1 yr. Episode frequency: intermittent that has been coming more frequently over last several days. The problem has been rapidly worsening. The pain is associated with rest and exertion. The pain is present in the substernal region. The pain is moderate. The quality of the pain is described as pressure-like. The pain radiates to the right arm. Episode Length: seconds to hours. The symptoms are aggravated by exertion. Associated symptoms include cough, nausea (improved after NTG) and shortness of breath. Pertinent negatives include no fever, no leg pain, no lower extremity edema, no malaise/fatigue, no sputum production and no vomiting. Risk factors include being elderly.  Her past medical history is significant for CAD, diabetes, hyperlipidemia and hypertension.  Pertinent negatives for past medical history include no DVT, no MI and no PE.  Procedure history is positive for cardiac catheterization (12/2015 - nonobstructed 40% LAD and 60% ramus).    Past Medical History:  Diagnosis Date  . Allergic rhinitis   . Anxiety   . Arthritis    OA  . Asthma   . CAD (coronary artery disease), native coronary artery 05/23/2016   Non obstructive with 40% mid LAD and 60% ramus  . Complication of anesthesia    gas bubble rt eye from an old retinal detachment  . Diabetes mellitus    type II  . Diverticulitis    Hx of  . Full dentures   . GERD (gastroesophageal reflux disease)   . Hematuria    HX OF MICROSCOPIC BLOOD IN URINE AND HX OF CYST ON ONE KIDNEY--BUT NO KIDNEY DISEASE  . Hyperlipidemia   . Hypertension   . IBS (irritable bowel syndrome)   . Osteopenia   . Wears glasses      Patient Active Problem List   Diagnosis Date Noted  . Non-cardiac chest pain 05/23/2016  . CAD (coronary artery disease), native coronary artery 05/23/2016  . LLQ abdominal pain 04/30/2016  . Chest pain 12/29/2015  . Abnormal stress test 12/29/2015  . Type 2 diabetes mellitus with circulatory disorder, without long-term current use of insulin ([place]) 06/17/2015  . Routine general medical examination at a health care facility 06/14/2015  . Hearing loss of aging 06/14/2015  . 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 Chronic neck and back pain 06/15/2014  . Right shoulder pain 03/12/2014  . Vitreomacular adhesion 05/05/2013  . Unspecified asthma(493.90) 04/23/2013  . Personal history of colonic polyps 03/03/2013  . Post-menopausal 10/17/2012  . Obesity 10/31/2011  . IBS (irritable bowel syndrome) 08/10/2011  . GERD 07/31/2010  . Allergic rhinitis 12/05/2007  . HYPERTENSION, BENIGN ESSENTIAL 02/05/2007  . TOBACCO USE, QUIT 02/05/2007  . Hyperlipidemia 01/31/2007  . ANXIETY 01/31/2007  . OSTEOARTHRITIS 01/31/2007  . DIVERTICULITIS, HX OF 01/31/2007    Past Surgical History:  Procedure Laterality Date  . ABDOMINAL HYSTERECTOMY    . APPENDECTOMY     per pt  . CARDIAC CATHETERIZATION N/A 12/29/2015   Procedure: Left Heart Cath and Coronary Angiography;  Surgeon: Belva Crome, MD;  Location: [facility];  Service: Cardiovascular;  Laterality: N/A;  . CARPAL TUNNEL RELEASE     both  hands  . CHOLECYSTECTOMY    . COLONOSCOPY    . EYE SURGERY     BILATERAL CATARACTS REMOVED  . EYE SURGERY     gas bubble in rt eye from a retinal detachment  . JOINT REPLACEMENT  2000   LEFT TOTAL KNEE ARTHROPLASTY  . JOINT REPLACEMENT  2013   rt total knee  . KNEE ARTHROSCOPY  1990   chondromalacia   . TOTAL KNEE ARTHROPLASTY  12/05/2011   Procedure: TOTAL KNEE ARTHROPLASTY;  Surgeon: Magnus Sinning, MD;  Location: [facility];  Service: Orthopedics;  Laterality: Right;  . ULNAR NERVE  TRANSPOSITION Right 09/15/2014   Procedure: Right cubital tunnel release with transposition of ulnar nerve;  Surgeon: Marianna Payment, MD;  Location: [facility];  Service: Orthopedics;  Laterality: Right;    OB History    No data available       Home Medications    Prior to Admission medications   Medication Sig Start Date End Date Taking? Authorizing Provider  ACCU-CHEK FASTCLIX LANCETS MISC TEST FOUR TIMES DAILY AS DIRECTED 01/15/17   Philemon Kingdom, MD  ACCU-CHEK SMARTVIEW test strip USE TO TEST BLOOD SUGAR 2 TIMES DAILY AS INSTRUCTED. 01/28/17   Philemon Kingdom, MD  acetaminophen (TYLENOL) 325 MG tablet Take 2 tablets (650 mg total) by mouth every 6 (six) hours as needed. 04/23/13   Delfina Redwood, MD  albuterol (VENTOLIN HFA) 108 (90 Base) MCG/ACT inhaler Inhale 2 puffs into the lungs every 6 (six) hours as needed for wheezing or shortness of breath. Reported on 12/30/2015    [provider]  aspirin 81 MG tablet Take 81 mg by mouth daily.    [provider]  Blood Glucose Monitoring Suppl (ACCU-CHEK NANO SMARTVIEW) w/Device KIT Check blood sugars 1-2 times a day. 01/15/17   Philemon Kingdom, MD  cetirizine (ZYRTEC) 10 MG tablet Take 10 mg by mouth daily as needed for allergies.    [provider]  ezetimibe (ZETIA) 10 MG tablet Take 1 tablet (10 mg total) by mouth daily. 05/18/16 08/16/16  Richardson Dopp T, PA-C  glipiZIDE (GLIPIZIDE XL) 5 MG 24 hr tablet Take 1 tablet (5 mg total) by mouth daily with breakfast. 02/25/17   Philemon Kingdom, MD  hydrochlorothiazide (HYDRODIURIL) 25 MG tablet TAKE 1 TABLET EVERY DAY 10/02/16   Tower, [name], MD  lisinopril (PRINIVIL,ZESTRIL) 10 MG tablet Take 1 tablet (10 mg total) by mouth daily. 04/03/16   Tower, [name], MD  nitroGLYCERIN (NITROSTAT) 0.4 MG SL tablet Place 1 tablet (0.4 mg total) under the tongue every 5 (five) minutes as needed for chest pain. 12/27/15   Richardson Dopp T, PA-C  omeprazole  (PRILOSEC) 20 MG capsule Take 1 capsule (20 mg total) by mouth daily. 10/23/16   Tower, [name], MD  terconazole (TERAZOL 3) 0.8 % vaginal cream Apply to affected vaginal areas once daily 09/24/16   Tower, [name], MD    Family History Family History  Problem Relation Age of Onset  . Pneumonia Mother   . Diabetes Father   . Cancer Father        of the gallbladder  . Heart disease Maternal Grandmother   . Heart disease Paternal Grandmother   . Diabetes Brother     Social History Social History  Substance Use Topics  . Smoking status: Former Smoker    Packs/day: 1.00    Years: 48.00    Quit date: 08/20/2009  . Smokeless tobacco: Never Used  .  Alcohol use No     Allergies   Naproxen; Naproxen sodium; Aspirin; Atorvastatin; Buspar [buspirone hcl]; Gabapentin; Metformin and related; Norco [hydrocodone-acetaminophen]; Pravastatin; Simvastatin; Sulfa antibiotics; and Sulfonamide derivatives   Review of Systems Review of Systems  Constitutional: Negative for fever and malaise/fatigue.  Respiratory: Positive for cough and shortness of breath. Negative for sputum production.   Cardiovascular: Positive for chest pain.  Gastrointestinal: Positive for nausea (improved after NTG). Negative for vomiting.  All other systems are reviewed and are negative for acute change except as noted in the HPI    Physical Exam Updated Vital Signs Ht _0  (1.549 m)   Wt 79.4 kg (175 lb)   SpO2 98%   BMI 33.07 kg/m   Physical Exam  Constitutional: She is oriented to person, place, and time. She appears well-developed and well-nourished. No distress.  HENT:  Head: Normocephalic and atraumatic.  Nose: Nose normal.  Eyes: Pupils are equal, round, and reactive to light. Conjunctivae and EOM are normal. Right eye exhibits no discharge. Left eye exhibits no discharge. No scleral icterus.  Neck: Normal range of motion. Neck supple.  Cardiovascular: Normal rate and regular rhythm.  Exam reveals no gallop  and no friction rub.   No murmur heard. Pulmonary/Chest: Effort normal and breath sounds normal. No stridor. No respiratory distress. She has no rales.  Abdominal: Soft. She exhibits no distension. There is no tenderness.  Musculoskeletal: She exhibits no edema or tenderness.  Neurological: She is alert and oriented to person, place, and time.  Skin: Skin is warm and dry. No rash noted. She is not diaphoretic. No erythema.  Psychiatric: She has a normal mood and affect.  Vitals reviewed.    ED Treatments / Results  Labs (all labs ordered are listed, but only abnormal results are displayed) Labs Reviewed  BASIC METABOLIC PANEL - Abnormal; Notable for the following:       Result Value   Sodium 133 (*)    Glucose, Bld 193 (*)    All other components within normal limits  CBC  TROPONIN I  I-STAT TROPONIN, ED    EKG  EKG Interpretation  Date/Time:  Friday March 08 2017 09:25:30 EDT Ventricular Rate:  69 PR Interval:    QRS Duration: 75 QT Interval:  382 QTC Calculation: 410 R Axis:   63 Text Interpretation:  Sinus rhythm POSSIBLE LEAD PLACEMENT REVERSAL NO STEMI Otherwise no significant change Confirmed by Addison Lank [phone]) on 03/08/2017 9:41:52 AM       Radiology Dg Chest 2 View  Result Date: 03/08/2017 CLINICAL DATA:  Chest pain . EXAM: CHEST  2 VIEW COMPARISON:  04/23/2013. FINDINGS: Mediastinum and hilar structures normal. Lungs are clear. No focal infiltrate. No pleural effusion or pneumothorax. Degenerative changes thoracic spine. Surgical clips right upper abdomen. IMPRESSION: Low lung volumes with mild bibasilar subsegmental atelectasis and/or scarring. No acute cardiopulmonary disease otherwise noted. Electronically Signed   By: Marcello Moores  Register   On: 03/08/2017 10:03    Procedures Procedures (including critical care time)  Medications Ordered in ED Medications  nitroGLYCERIN (NITROGLYN) 2 % ointment 1 inch (1 inch Topical Given 03/08/17 1037)     Initial  Impression / Assessment and Plan / ED Course  I have reviewed the triage vital signs and the nursing notes.  Pertinent labs & imaging results that were available during my care of the patient were reviewed by me and considered in my medical decision making (see chart for details).     Persistently worsening intermittent  substernal chest pain that was previously exertional and now occurs at rest. EKG without acute ischemic changes or evidence of pericarditis. Initial troponin negative. Patient had a recent catheterization last year with nonobstructive disease. Unlikely angina but concern for unstable angina given the increased frequency and pain at rest.  Pain improved with aspirin and nitroglycerin.   Presentation is not classic for aortic dissection or esophageal perforation. Low pretest probability for pulmonary embolism. Chest x-ray without evidence suggestive of pneumonia, pneumothorax, pneumomediastinum.  No abnormal contour of the mediastinum to suggest dissection. No evidence of acute injuries.   Case discussed with cardiology for admission.  Final Clinical Impressions(s) / ED Diagnoses   Final diagnoses:  Atypical chest pain      Chinmayi Rumer, Grayce Sessions, MD 03/08/17 1649

## 2017-03-08 NOTE — ED Notes (Signed)
Patient transported to X-ray 

## 2017-03-08 NOTE — ED Notes (Signed)
2/10 cp

## 2017-03-08 NOTE — ED Provider Notes (Signed)
Patient's care signed out to follow-up troponin and cardiology recommendations. Cardiology recommends close outpatient follow-up in the office. Troponin negative.  Erin Beck, Erin Compton, MD 03/08/17 [phone]

## 2017-03-08 NOTE — ED Notes (Signed)
Cards at bedside

## 2017-03-08 NOTE — Telephone Encounter (Signed)
Patient Name: Erin Beck  DOB: 1940/10/24    Initial Comment caller states she is having chest pain and abdominal pain , sneezing and congestion . She was calling to get an appointment but Dr. Glori Bickers is out of the office today    Nurse Assessment  Nurse: Verlin Fester, RN, Stanton Kidney Date/Time Eilene Ghazi Time): 03/08/2017 8:17:29 AM  Confirm and document reason for call. If symptomatic, describe symptoms. ---Patient states she is having right chest pain and right arm. and she feels nauseated and sick to her stomach  Does the patient have any new or worsening symptoms? ---Yes  Will a triage be completed? ---Yes  Related visit to physician within the last 2 weeks? ---No  Does the PT have any chronic conditions? (i.e. diabetes, asthma, etc.) ---Yes  List chronic conditions. ---"diabetes,  Is this a behavioral health or substance abuse call? ---No     Guidelines    Guideline Title Affirmed Question Affirmed Notes  Chest Pain [1] Chest pain lasts > 5 minutes AND [2] described as crushing, pressure-like, or heavy    Final Disposition User   Call EMS 911 Now Noe, RN, [facility] - ED   Disagree/Comply: Comply

## 2017-03-08 NOTE — Consult Note (Signed)
Cardiology Consult    Patient ID: Erin Beck MRN: [id number], DOB/AGE: 09/21/1940   Admit date: 03/08/2017   Primary Physician: Tower, [name], MD Primary Cardiologist: Dr Radford Pax   History of Present Illness    Erin Beck is a 76 y.o. female with past medical history of Hypertension, hyperlipidemia, diabetes type 2, GERD. She has presented to the ED today for complaints of chest pain.   She has had problems with chest pain over the last 2 years. She said that it began when she was placed on metformin which she found made her feel bad, have loose stools, and low blood sugars. She was evaluated for complaints of chest pain in 12/2015 by Dr Radford Pax. She had an intermediate risk nuclear stress test and underwent left heart cath on 12/29/2015 that showed nonobstructive CAD with mid LAD to distal LAD lesion of 40% stenosis and 60% ramus lesion,  normal LV function and mildly elevated filling pressures. It was thought that inferior ischemia noted on the stress test was not explained by her coronary anatomy and therefore likely represented a false positive study. An echocardiogram on 12/21/2015 showed normal LV systolic function with EF 60-65%, normal wall motion, diastolic dysfunction, normal LV filling pressure, low normal RV systolic function.  After the evaluation in 12/2015 the patient stopped taking metformin and she was feeling much better. However, she has been experiencing chest pain again since early spring and it has become progressively more frequent. The pain is in the central chest and shoots up to the back of her head at times and frequently wakes her at night. She finds that she has to sit up for a couple of hours until it eases off and she can go back to bed. The pain usually lasts from 30 minutes to an hour. Today her pain is more severe, 8-9/10, originating in the central chest and shooting out to the right breast and to the right side. It has eased off to more of a soreness.  She does have some shortness of breath with this and breaks out in a sweat but no palpitations, lightheadedness, or nausea. She has no orthopnea, PND, or edema. She does note that she has dyspnea on exertion like climbing stairs, having to stop at the top of the stairs due to lightheadedness and fatigue. She also notes that she is lightheaded upon standing especially when she gets out of bed first thing in the morning. She does have a history of GERD and takes omeprazole. Her pain is not related to meals or eating certain types of food.  She does not drink alcohol and she quit smoking in 2011  First troponin was negative EKG is without ischemic changes Chest x-ray shows low lung volumes with mild bibasilar segmental atelectasis and/or scarring. No acute cardiopulmonary disease otherwise noted  Past Medical History    Past Medical History:  Diagnosis Date  . Allergic rhinitis   . Anxiety   . Arthritis    OA  . Asthma   . CAD (coronary artery disease), native coronary artery 05/23/2016   Non obstructive with 40% mid LAD and 60% ramus  . Complication of anesthesia    gas bubble rt eye from an old retinal detachment  . Diabetes mellitus    type II  . Diverticulitis    Hx of  . Full dentures   . GERD (gastroesophageal reflux disease)   . Hematuria    HX OF MICROSCOPIC BLOOD IN URINE AND HX OF  CYST ON ONE KIDNEY--BUT NO KIDNEY DISEASE  . Hyperlipidemia   . Hypertension   . IBS (irritable bowel syndrome)   . Osteopenia   . Wears glasses     Past Surgical History:  Procedure Laterality Date  . ABDOMINAL HYSTERECTOMY    . APPENDECTOMY     per pt  . CARDIAC CATHETERIZATION N/A 12/29/2015   Procedure: Left Heart Cath and Coronary Angiography;  Surgeon: Belva Crome, MD;  Location: [facility];  Service: Cardiovascular;  Laterality: N/A;  . CARPAL TUNNEL RELEASE     both hands  . CHOLECYSTECTOMY    . COLONOSCOPY    . EYE SURGERY     BILATERAL CATARACTS REMOVED  . EYE  SURGERY     gas bubble in rt eye from a retinal detachment  . JOINT REPLACEMENT  2000   LEFT TOTAL KNEE ARTHROPLASTY  . JOINT REPLACEMENT  2013   rt total knee  . KNEE ARTHROSCOPY  1990   chondromalacia   . TOTAL KNEE ARTHROPLASTY  12/05/2011   Procedure: TOTAL KNEE ARTHROPLASTY;  Surgeon: Magnus Sinning, MD;  Location: [facility];  Service: Orthopedics;  Laterality: Right;  . ULNAR NERVE TRANSPOSITION Right 09/15/2014   Procedure: Right cubital tunnel release with transposition of ulnar nerve;  Surgeon: Marianna Payment, MD;  Location: [facility];  Service: Orthopedics;  Laterality: Right;     Allergies  Allergies  Allergen Reactions  . Naproxen Rash    Trouble breathing  . Naproxen Sodium Rash    Trouble breathing  . Aspirin     REACTION: burns stomach PT STATES SHE DOES TOLERATE THE 81 MG ASPIRIN DAILY  . Atorvastatin     REACTION: muscle pain  . Buspar [Buspirone Hcl]     Multiple side eff  . Gabapentin Other (See Comments)    Palpitations/ dizziness   . Metformin And Related     Mm aches, palpitations, SOB, worse eyesight, loss of balance, sweating, low CBGs  . Norco [Hydrocodone-Acetaminophen] Other (See Comments)    Dizzy and problems remembering   . Pravastatin     Leg pain  . Simvastatin     REACTION: GI side eff and swelling  . Sulfa Antibiotics Other (See Comments)    Severe joint pain  . Sulfonamide Derivatives     REACTION: reaction not known     Home Medications    Prior to Admission medications   Medication Sig Start Date End Date Taking? Authorizing Provider  ACCU-CHEK FASTCLIX LANCETS MISC TEST FOUR TIMES DAILY AS DIRECTED 01/15/17   Philemon Kingdom, MD  ACCU-CHEK SMARTVIEW test strip USE TO TEST BLOOD SUGAR 2 TIMES DAILY AS INSTRUCTED. 01/28/17   Philemon Kingdom, MD  acetaminophen (TYLENOL) 325 MG tablet Take 2 tablets (650 mg total) by mouth every 6 (six) hours as needed. 04/23/13   Delfina Redwood, MD  albuterol (VENTOLIN HFA)  108 (90 Base) MCG/ACT inhaler Inhale 2 puffs into the lungs every 6 (six) hours as needed for wheezing or shortness of breath. Reported on 12/30/2015    [provider]  aspirin 81 MG tablet Take 81 mg by mouth daily.    [provider]  Blood Glucose Monitoring Suppl (ACCU-CHEK NANO SMARTVIEW) w/Device KIT Check blood sugars 1-2 times a day. 01/15/17   Philemon Kingdom, MD  cetirizine (ZYRTEC) 10 MG tablet Take 10 mg by mouth daily as needed for allergies.    [provider]  ezetimibe (ZETIA) 10 MG tablet Take 1  tablet (10 mg total) by mouth daily. 05/18/16 08/16/16  Richardson Dopp T, PA-C  glipiZIDE (GLIPIZIDE XL) 5 MG 24 hr tablet Take 1 tablet (5 mg total) by mouth daily with breakfast. 02/25/17   Philemon Kingdom, MD  hydrochlorothiazide (HYDRODIURIL) 25 MG tablet TAKE 1 TABLET EVERY DAY 10/02/16   Tower, [name], MD  lisinopril (PRINIVIL,ZESTRIL) 10 MG tablet Take 1 tablet (10 mg total) by mouth daily. 04/03/16   Tower, [name], MD  nitroGLYCERIN (NITROSTAT) 0.4 MG SL tablet Place 1 tablet (0.4 mg total) under the tongue every 5 (five) minutes as needed for chest pain. 12/27/15   Richardson Dopp T, PA-C  omeprazole (PRILOSEC) 20 MG capsule Take 1 capsule (20 mg total) by mouth daily. 10/23/16   Tower, [name], MD  terconazole (TERAZOL 3) 0.8 % vaginal cream Apply to affected vaginal areas once daily 09/24/16   Tower, [name], MD    Family History    Family History  Problem Relation Age of Onset  . Pneumonia Mother   . Diabetes Father   . Cancer Father        of the gallbladder  . Heart disease Maternal Grandmother   . Heart disease Paternal Grandmother   . Diabetes Brother      Social History    Social History   Social History  . Marital status: Married    Spouse name: N/A  . Number of children: N/A  . Years of education: N/A   Occupational History  . Not on file.   Social History Main Topics  . Smoking status: Former Smoker    Packs/day: 1.00    Years:  48.00    Quit date: 08/20/2009  . Smokeless tobacco: Never Used  . Alcohol use No  . Drug use: No  . Sexual activity: No   Other Topics Concern  . Not on file   Social History Narrative  . No narrative on file     Review of Systems    General:  No chills, fever, night sweats or weight changes.  Cardiovascular:  Positive for intermittent chest pain as above, positive for dyspnea on exertion, no edema, orthopnea, palpitations, paroxysmal nocturnal dyspnea. Dermatological: No rash, lesions/masses Respiratory: No cough, dyspnea Urologic: No hematuria, dysuria Abdominal:   No nausea, vomiting, diarrhea, bright red blood per rectum, melena, or hematemesis Neurologic:  No visual changes, wkns, changes in mental status. All other systems reviewed and are otherwise negative except as noted above.  Physical Exam    Blood pressure 117/60, pulse 61, resp. rate 11, height _0  (1.549 m), weight 175 lb (79.4 kg), SpO2 94 %.  General: Well developed, well nourished,female in no acute distress. Head: Normocephalic, atraumatic, sclera non-icteric, no xanthomas, nares are without discharge. Dentition:  Neck: No carotid bruits. JVD not elevated.  Lungs: Respirations regular and unlabored, without wheezes or rales.  Heart: Regular rate and rhythm. No S3 or S4.  No murmur, no rubs, or gallops appreciated. Abdomen: Soft, non-tender, non-distended with normoactive bowel sounds. No hepatomegaly. No rebound/guarding. No obvious abdominal masses. Patient has epigastric tenderness to palpation. Msk:  Strength and tone appear normal for age. No joint deformities or effusions. Extremities: No clubbing or cyanosis. No edema.  Distal pedal pulses are 2+ bilaterally. Neuro: Alert and oriented X 3. Moves all extremities spontaneously. No focal deficits noted. Psych:  Responds to questions appropriately with a normal affect. Skin: No rashes or lesions noted  Labs    Troponin (Point of Care Test)  Recent  Labs  03/08/17 0946  TROPIPOC 0.00   No results for input(s): CKTOTAL, CKMB, TROPONINI in the last 72 hours. Lab Results  Component Value Date   WBC 5.6 03/08/2017   HGB 13.6 03/08/2017   HCT 40.9 03/08/2017   MCV 89.9 03/08/2017   PLT 226 03/08/2017    Recent Labs Lab 03/08/17 0930  NA 133*  K 4.5  CL 101  CO2 23  BUN 17  CREATININE 0.73  CALCIUM 9.1  GLUCOSE 193*   Lab Results  Component Value Date   CHOL 232 (H) 10/01/2016   HDL 44.60 10/01/2016   LDLCALC 154 (H) 10/01/2016   TRIG 170.0 (H) 10/01/2016   Lab Results  Component Value Date   DDIMER 0.57 (H) 04/23/2013    No results found for: BNP No results found for: PROBNP No results for input(s): INR in the last 72 hours.    Radiology Studies    Dg Chest 2 View  Result Date: 03/08/2017 CLINICAL DATA:  Chest pain . EXAM: CHEST  2 VIEW COMPARISON:  04/23/2013. FINDINGS: Mediastinum and hilar structures normal. Lungs are clear. No focal infiltrate. No pleural effusion or pneumothorax. Degenerative changes thoracic spine. Surgical clips right upper abdomen. IMPRESSION: Low lung volumes with mild bibasilar subsegmental atelectasis and/or scarring. No acute cardiopulmonary disease otherwise noted. Electronically Signed   By: Marcello Moores  Register   On: 03/08/2017 10:03    EKG & Cardiac Imaging    EKG: Sinus rhythm, 69 bpm, no acute ischemic changes   ECHOCARDIOGRAM: 12/21/15 Study Conclusions  - Left ventricle: The cavity size was normal. Wall thickness was   increased in a pattern of mild LVH. Systolic function was normal.   The estimated ejection fraction was in the range of 60% to 65%.   Wall motion was normal; there were no regional wall motion   abnormalities. Doppler parameters are consistent with abnormal   left ventricular relaxation (grade 1 diastolic dysfunction). The   E/e&' ratio is <8, suggesting normal LV filling pressure. - Left atrium: The atrium was normal in size. - Right ventricle: The  cavity size was normal. Wall thickness was   normal. Systolic function was low normal. - Right atrium: The atrium was normal in size. - Inferior vena cava: The vessel was normal in size. The   respirophasic diameter changes were in the normal range (>= 50%),   consistent with normal central venous pressure.  Impressions:  - LVEF 60-65%, mild LVH, normal wall motion, diastolic dysfunction,   normal LV filling pressure, normal LA size, low normal RV   systolic function, normal RA size, normal IVC.   Left Heart Cath and Coronary Angiography 12/29/2015  Conclusion   1. Mid LAD to Dist LAD lesion, 40% stenosed. 2. Ramus lesion, 60% stenosed.    Luminal irregularities in the LAD and ramus intermedius. No significant obstructive disease noted in any coronary territory. Significant improvement in left coronary arterial diameter after intracoronary nitroglycerin.  Normal left ventricular function with mild elevation in filling pressures.  Inferior ischemia noted on nuclear scintigraphy is not explained by coronary anatomy and therefore likely represents a false positive study      Assessment & Plan    Chest pain -Patient has had complaints of chest pain for the past 2 years, having improved after stopping metformin a little over a year ago, but has returned since early spring and is becoming progressively more frequent. -Cardiac catheterization in 12/2015 showed nonobstructive CAD with 40% mid to distal LAD lesion  and 60% ramus lesion. -Echocardiogram in 12/2015 showed normal LV systolic function -First troponin is negative -EKG without ischemic changes -CVD risk factors include diabetes type 2, hypertension, hyperlipidemia (untreated due to medication intolerance), obesity with Body mass index is 33.07 kg/m., remote smoking -Differentials include myocardial ischemia, gastroesophageal reflux -Options discussed with the patient and her husband by Dr. Sallyanne Kuster including admission with  further testing. Pt does not wish to stay. Will check a stat troponin and if negative she can be discharged from the ED. I will arrange for outpatient stress test- 03/12/17 at 2:30 and follow-up with Dr. Radford Pax on 04/02/17 at 8:45.   Diabetes type 2 -Most recent hemoglobin A1c was 6.7 on 02/25/17. Managed by primary care, on oral agents.  Hypertension -Managed with hydrochlorothiazide 25 mg daily and lisinopril 10 mg daily, blood pressure appears well controlled today and upon review of recent office notes.  -The patient reports frequent lightheadedness upon standing and lightheadedness and fatigue after climbing stairs. Home blood pressures are occasionally in the 90s per her husband. We'll check orthostatic vital signs. She may need a reduction in her dose of hydrochlorothiazide.  Hyperlipidemia -Most recent lipid panel on 10/01/16 shows total cholesterol 232, LDL 154, non-HDL 187. Patient has simvastatin on her allergy list with reaction of GI side effects and swelling and pravastatin on her allergy list due to leg pain. She also stopped taking his Zetia due to GI upset and feeling bad. In the setting of diabetes and nonobstructive CAD the patient should be on a statin if at all possible. Recommend retrial of low dose statin.  GERD -Continue PPI  Signed, Daune Perch, NP-C 03/08/2017, 2:39 PM Pager: [phone]  I have seen and examined the patient along with Daune Perch, NP-C.  I have reviewed the chart, notes and new data.  I agree with PA/NP's note.  Key new complaints: Chest pain syndrome has mostly atypical features, except for the fact that it occurs consistently at 3-4 AM. Some relief with antacids, incomplete. No clear association with meals and no association with physical activity. She has known coronary atherosclerosis with subcritical lesions by cardiac catheterization about a year ago. She also has unaddressed risk factors (intolerant to numerous statins). Key examination  changes: No arrhythmia, no overt heart failure, normal distal pulses, looks comfortable lying fully flat in bed Key new findings / data: Initial cardiac troponin normal. ECG low risk.  PLAN: Repeat a second troponin. It has now been roughly 12 hours since initial symptom onset and if this value is also normal, myocardial ischemia appears unlikely. History already had intermediate severity coronary disease, it is not unreasonable to reevaluate this. Prefer outpatient nuclear study, with images performed in the same location as her first test so that we can compare them side by side. The presence of a previous "false positive" inferior perfusion defect is noted and the repeat study should be compared to the initial one. Symptoms are no longer present, ECG is low risk, a second troponin remains normal further workup can be safely performed as an outpatient. If a coronary etiology for her symptoms is not confirmed, recommend referral to gastroenterologist.  I can their issue, notes probably excessive blood pressure control. Recommend reducing hydrochlorothiazide to 12.5 mg daily.   Sanda Klein, MD, [place] [phone] 03/08/2017, 4:47 PM

## 2017-03-08 NOTE — ED Triage Notes (Signed)
Pt in from home via [facility] with c/o substernal cp, 7/10 since waking this am at 0530. Similar intermittent pain x 1 mo. Pt c/o nausea, dizziness also, R arm radiation pain. Hx of heart cath 1 yr ago. Given 324 ASA, 1 NTG en route. CBG 102, alert, VSS

## 2017-03-21 ENCOUNTER — Telehealth (HOSPITAL_COMMUNITY): Payer: Self-pay | Admitting: *Deleted

## 2017-03-21 NOTE — Telephone Encounter (Signed)
Patient given detailed instructions per Myocardial Perfusion Study Information Sheet for the test on 03/27/17. Patient notified to arrive 15 minutes early and that it is imperative to arrive on time for appointment to keep from having the test rescheduled.  If you need to cancel or reschedule your appointment, please call the office within 24 hours of your appointment. . Patient verbalized understanding. Kirstie Peri

## 2017-03-27 ENCOUNTER — Ambulatory Visit (HOSPITAL_COMMUNITY): Payer: Medicare HMO | Attending: Cardiology

## 2017-03-27 DIAGNOSIS — I2510 Atherosclerotic heart disease of native coronary artery without angina pectoris: Secondary | ICD-10-CM | POA: Diagnosis not present

## 2017-03-27 DIAGNOSIS — R079 Chest pain, unspecified: Secondary | ICD-10-CM | POA: Diagnosis not present

## 2017-03-27 DIAGNOSIS — R0609 Other forms of dyspnea: Secondary | ICD-10-CM | POA: Diagnosis not present

## 2017-03-27 DIAGNOSIS — I10 Essential (primary) hypertension: Secondary | ICD-10-CM | POA: Diagnosis not present

## 2017-03-27 DIAGNOSIS — J45909 Unspecified asthma, uncomplicated: Secondary | ICD-10-CM | POA: Diagnosis not present

## 2017-03-27 DIAGNOSIS — E119 Type 2 diabetes mellitus without complications: Secondary | ICD-10-CM | POA: Insufficient documentation

## 2017-03-27 LAB — MYOCARDIAL PERFUSION IMAGING
CHL CUP NUCLEAR SDS: 2
CHL CUP NUCLEAR SSS: 15
LHR: 0.31
LV dias vol: 70 mL (ref 46–106)
LV sys vol: 20 mL
Peak HR: 88 {beats}/min
Rest HR: 64 {beats}/min
SRS: 13
TID: 1.03

## 2017-03-27 MED ORDER — REGADENOSON 0.4 MG/5ML IV SOLN
0.4000 mg | Freq: Once | INTRAVENOUS | Status: AC
  Administered 2017-03-27: 0.4 mg via INTRAVENOUS

## 2017-03-27 MED ORDER — TECHNETIUM TC 99M TETROFOSMIN IV KIT
32.4000 | PACK | Freq: Once | INTRAVENOUS | Status: AC | PRN
  Administered 2017-03-27: 32.4 via INTRAVENOUS
  Filled 2017-03-27: qty 33

## 2017-03-27 MED ORDER — TECHNETIUM TC 99M TETROFOSMIN IV KIT
10.2000 | PACK | Freq: Once | INTRAVENOUS | Status: AC | PRN
  Administered 2017-03-27: 10.2 via INTRAVENOUS
  Filled 2017-03-27: qty 11

## 2017-04-02 ENCOUNTER — Ambulatory Visit (INDEPENDENT_AMBULATORY_CARE_PROVIDER_SITE_OTHER): Payer: Medicare HMO | Admitting: Physician Assistant

## 2017-04-02 ENCOUNTER — Encounter: Payer: Self-pay | Admitting: Physician Assistant

## 2017-04-02 VITALS — BP 124/70 | HR 76 | Ht 61.0 in | Wt 176.2 lb

## 2017-04-02 DIAGNOSIS — K219 Gastro-esophageal reflux disease without esophagitis: Secondary | ICD-10-CM | POA: Diagnosis not present

## 2017-04-02 DIAGNOSIS — I2510 Atherosclerotic heart disease of native coronary artery without angina pectoris: Secondary | ICD-10-CM

## 2017-04-02 DIAGNOSIS — I10 Essential (primary) hypertension: Secondary | ICD-10-CM

## 2017-04-02 DIAGNOSIS — E7800 Pure hypercholesterolemia, unspecified: Secondary | ICD-10-CM

## 2017-04-02 DIAGNOSIS — R002 Palpitations: Secondary | ICD-10-CM | POA: Diagnosis not present

## 2017-04-02 MED ORDER — METOPROLOL SUCCINATE ER 25 MG PO TB24
25.0000 mg | ORAL_TABLET | Freq: Every day | ORAL | 11 refills | Status: DC
Start: 2017-04-02 — End: 2017-05-10

## 2017-04-02 NOTE — Progress Notes (Signed)
Cardiology Office Note    Date:  04/02/2017   ID:  ALVIN RUBANO, DOB 1941-02-03, MRN [id number]  PCP:  Abner Greenspan, MD  Cardiologist: Dr. Radford Pax  Chief Complaint  Patient presents with  . Follow-up    seen for Dr.Turner    History of Present Illness:  Erin Beck is a 76 y.o. female with a hx of HTN, HL, diabetes, GERD with history of chest pain, dyspnea and dizziness 12/2015. Echo 12/21/15 Showed mild LVH, normal LV function and mild DDD. Myoview and moderate inferior wall ischemia LVEF 62% felt to be intermediate risk. LHC 12/29/15 demonstrated 40% mid LAD and 60% RA with no significant obstructive CAD. Patient did have improvement in artery diameter with IC NTG. Nuclear stress test was felt to be false positive. Holter monitor showed PACs and PVCs. She is statin intolerant. Last saw Dr. Radford Pax 05/2016 was doing well. She thought it was secondary to metformin and felt better after stopping it.  She was seen by Dr. Recardo Evangelist in ER 03/08/17 for the same symptoms of chest pain and dyspnea on exertion since spring. Troponins negative and EKG without acute change. Felt to be myocardial ischemia or GERD. Nuclear stress test 03/27/17 was normal LVEF 71%.  Patient here for f/u. She has awakened a few times with heart skipping and racing Associated with some chest pain. She thinks some of her chest pain may be GERD related.   Past Medical History:  Diagnosis Date  . Allergic rhinitis   . Anxiety   . Arthritis    OA  . Asthma   . CAD (coronary artery disease), native coronary artery 05/23/2016   Non obstructive with 40% mid LAD and 60% ramus  . Complication of anesthesia    gas bubble rt eye from an old retinal detachment  . Diabetes mellitus    type II  . Diverticulitis    Hx of  . Full dentures   . GERD (gastroesophageal reflux disease)   . Hematuria    HX OF MICROSCOPIC BLOOD IN URINE AND HX OF CYST ON ONE KIDNEY--BUT NO KIDNEY DISEASE  . Hyperlipidemia   . Hypertension     . IBS (irritable bowel syndrome)   . Osteopenia   . Wears glasses     Past Surgical History:  Procedure Laterality Date  . ABDOMINAL HYSTERECTOMY    . APPENDECTOMY     per pt  . CARDIAC CATHETERIZATION N/A 12/29/2015   Procedure: Left Heart Cath and Coronary Angiography;  Surgeon: Belva Crome, MD;  Location: [facility];  Service: Cardiovascular;  Laterality: N/A;  . CARPAL TUNNEL RELEASE     both hands  . CHOLECYSTECTOMY    . COLONOSCOPY    . EYE SURGERY     BILATERAL CATARACTS REMOVED  . EYE SURGERY     gas bubble in rt eye from a retinal detachment  . JOINT REPLACEMENT  2000   LEFT TOTAL KNEE ARTHROPLASTY  . JOINT REPLACEMENT  2013   rt total knee  . KNEE ARTHROSCOPY  1990   chondromalacia   . TOTAL KNEE ARTHROPLASTY  12/05/2011   Procedure: TOTAL KNEE ARTHROPLASTY;  Surgeon: Magnus Sinning, MD;  Location: [facility];  Service: Orthopedics;  Laterality: Right;  . ULNAR NERVE TRANSPOSITION Right 09/15/2014   Procedure: Right cubital tunnel release with transposition of ulnar nerve;  Surgeon: Marianna Payment, MD;  Location: [facility];  Service: Orthopedics;  Laterality: Right;    Current Medications:  Current Meds  Medication Sig  . ACCU-CHEK FASTCLIX LANCETS MISC TEST FOUR TIMES DAILY AS DIRECTED  . ACCU-CHEK SMARTVIEW test strip USE TO TEST BLOOD SUGAR 2 TIMES DAILY AS INSTRUCTED.  Marland Kitchen acetaminophen (TYLENOL) 325 MG tablet Take 650 mg by mouth every 6 (six) hours as needed (pain).  Marland Kitchen albuterol (VENTOLIN HFA) 108 (90 Base) MCG/ACT inhaler Inhale 2 puffs into the lungs every 6 (six) hours as needed for wheezing or shortness of breath. Reported on 12/30/2015  . aspirin 81 MG tablet Take 81 mg by mouth daily.  . Blood Glucose Monitoring Suppl (ACCU-CHEK NANO SMARTVIEW) w/Device KIT Check blood sugars 1-2 times a day.  . cetirizine (ZYRTEC) 10 MG tablet Take 10 mg by mouth daily as needed for allergies.  Marland Kitchen glipiZIDE (GLIPIZIDE XL) 5 MG 24 hr tablet Take 1  tablet (5 mg total) by mouth daily with breakfast.  . hydrochlorothiazide (HYDRODIURIL) 25 MG tablet Take 25 mg by mouth daily.  Marland Kitchen lisinopril (PRINIVIL,ZESTRIL) 10 MG tablet Take 1 tablet (10 mg total) by mouth daily.  . nitroGLYCERIN (NITROSTAT) 0.4 MG SL tablet Place 1 tablet (0.4 mg total) under the tongue every 5 (five) minutes as needed for chest pain.  Marland Kitchen omeprazole (PRILOSEC) 20 MG capsule Take 1 capsule (20 mg total) by mouth daily.  Marland Kitchen terconazole (TERAZOL 3) 0.8 % vaginal cream Apply to affected vaginal areas once daily     Allergies:   Naproxen; Naproxen sodium; Aspirin; Atorvastatin; Buspar [buspirone hcl]; Gabapentin; Metformin and related; Norco [hydrocodone-acetaminophen]; Pravastatin; Simvastatin; Sulfa antibiotics; and Sulfonamide derivatives   Social History   Social History  . Marital status: Married    Spouse name: N/A  . Number of children: N/A  . Years of education: N/A   Social History Main Topics  . Smoking status: Former Smoker    Packs/day: 1.00    Years: 48.00    Quit date: 08/20/2009  . Smokeless tobacco: Never Used  . Alcohol use No  . Drug use: No  . Sexual activity: No   Other Topics Concern  . None   Social History Narrative  . None     Family History:  The patient's family history includes Cancer in her father; Diabetes in her brother and father; Heart disease in her maternal grandmother and paternal grandmother; Pneumonia in her mother.   ROS:   Please see the history of present illness.    Review of Systems  Cardiovascular: Positive for chest pain and palpitations.  Gastrointestinal: Positive for heartburn.   All other systems reviewed and are negative.   PHYSICAL EXAM:   VS:  BP 124/70   Pulse 76   Ht _0  (1.549 m)   Wt 176 lb 3.2 oz (79.9 kg)   BMI 33.29 kg/m   Physical Exam  GEN: Obese, in no acute distress  Neck: no JVD, carotid bruits, or masses Cardiac:RRR; no murmurs, rubs, or gallops  Respiratory:  clear to auscultation  bilaterally, normal work of breathing GI: soft, nontender, nondistended, + BS Ext: without cyanosis, clubbing, or edema, Good distal pulses bilaterally Neuro:  Alert and Oriented x 3 Psych: euthymic mood, full affect  Wt Readings from Last 3 Encounters:  04/02/17 176 lb 3.2 oz (79.9 kg)  03/08/17 175 lb (79.4 kg)  02/25/17 175 lb (79.4 kg)      Studies/Labs Reviewed:   EKG:  EKG is not ordered today.    Recent Labs: 10/01/2016: ALT 34; TSH 1.55 03/08/2017: BUN 17; Creatinine, Ser 0.73; Hemoglobin 13.6; Platelets  226; Potassium 4.5; Sodium 133   Lipid Panel    Component Value Date/Time   CHOL 232 (H) 10/01/2016 0824   TRIG 170.0 (H) 10/01/2016 0824   HDL 44.60 10/01/2016 0824   CHOLHDL 5 10/01/2016 0824   VLDL 34.0 10/01/2016 0824   LDLCALC 154 (H) 10/01/2016 0824   LDLDIRECT 154.0 06/10/2015 0828    Additional studies/ records that were reviewed today include:  Nuclear stress test 8/8/18Study Highlights   Nuclear stress EF: 71%.  There was no ST segment deviation noted during stress.  The study is normal.  This is a low risk study.  The left ventricular ejection fraction is hyperdynamic (>65%).     ECHOCARDIOGRAM: 12/21/15 Study Conclusions   - Left ventricle: The cavity size was normal. Wall thickness was   increased in a pattern of mild LVH. Systolic function was normal.   The estimated ejection fraction was in the range of 60% to 65%.   Wall motion was normal; there were no regional wall motion   abnormalities. Doppler parameters are consistent with abnormal   left ventricular relaxation (grade 1 diastolic dysfunction). The   E/e&' ratio is <8, suggesting normal LV filling pressure. - Left atrium: The atrium was normal in size. - Right ventricle: The cavity size was normal. Wall thickness was   normal. Systolic function was low normal. - Right atrium: The atrium was normal in size. - Inferior vena cava: The vessel was normal in size. The   respirophasic  diameter changes were in the normal range (>= 50%),   consistent with normal central venous pressure.   Impressions:   - LVEF 60-65%, mild LVH, normal wall motion, diastolic dysfunction,   normal LV filling pressure, normal LA size, low normal RV   systolic function, normal RA size, normal IVC.     Left Heart Cath and Coronary Angiography 12/29/2015  Conclusion    1. Mid LAD to Dist LAD lesion, 40% stenosed. 2. Ramus lesion, 60% stenosed.    Luminal irregularities in the LAD and ramus intermedius. No significant obstructive disease noted in any coronary territory. Significant improvement in left coronary arterial diameter after intracoronary nitroglycerin.  Normal left ventricular function with mild elevation in filling pressures.  Inferior ischemia noted on nuclear scintigraphy is not explained by coronary anatomy and therefore likely represents a false positive study          ASSESSMENT:    1. Coronary artery disease involving native coronary artery of native heart without angina pectoris   2. Palpitations   3. HYPERTENSION, BENIGN ESSENTIAL   4. Gastroesophageal reflux disease, esophagitis presence not specified   5. Pure hypercholesterolemia      PLAN:  In order of problems listed above:  CAD nonobstructive with recent emergency room visit for chest pain with negative troponins and follow-up nuclear stress test that was low risk without ischemia. Continue medical therapy.  Palpitations seem to precipitate some of her symptoms. She did have PACs and PVCs on prior Holter monitor. We'll try low-dose beta blocker to see if this doesn't relieve some of her symptoms.  Hypertension controlled  GERD of asked her to increase her Prilosec to 20 mg twice a day and follow-up with primary care  Hypercholesterolemia on Crestor last lipid panel in February LDL 154. Will need repeat at next office visit.     Medication Adjustments/Labs and Tests Ordered: Current medicines are  reviewed at length with the patient today.  Concerns regarding medicines are outlined above.  Medication changes, Labs  and Tests ordered today are listed in the Patient Instructions below. There are no Patient Instructions on file for this visit.   Sumner Boast, PA-C  04/02/2017 9:02 AM    [facility] [address], [place], [place]  [address] Phone: [phone]; Fax: [phone]

## 2017-04-02 NOTE — Patient Instructions (Signed)
Medication Instructions:  Your physician has recommended you make the following change in your medication: 1.) START Metoprolol 25 mg once daily.   Labwork: None Ordered   Testing/Procedures: None Ordered   Follow-Up: Your physician recommends that you schedule a follow-up appointment in: Wednesday November 14 at 9:00   Any Other Special Instructions Will Be Listed Below (If Applicable).  Please start taking Prilosec 20 mg twice a day.    If you need a refill on your cardiac medications before your next appointment, please call your pharmacy.

## 2017-04-24 ENCOUNTER — Encounter: Payer: Self-pay | Admitting: Family Medicine

## 2017-04-24 ENCOUNTER — Telehealth: Payer: Self-pay

## 2017-04-24 ENCOUNTER — Ambulatory Visit (INDEPENDENT_AMBULATORY_CARE_PROVIDER_SITE_OTHER): Payer: Medicare HMO | Admitting: Family Medicine

## 2017-04-24 VITALS — BP 106/64 | HR 72 | Temp 97.7°F | Wt 175.0 lb

## 2017-04-24 DIAGNOSIS — F419 Anxiety disorder, unspecified: Secondary | ICD-10-CM

## 2017-04-24 DIAGNOSIS — R002 Palpitations: Secondary | ICD-10-CM

## 2017-04-24 DIAGNOSIS — I10 Essential (primary) hypertension: Secondary | ICD-10-CM

## 2017-04-24 MED ORDER — LISINOPRIL 10 MG PO TABS: 5.0000 mg | ORAL_TABLET | Freq: Every day | ORAL | 3 refills | Status: DC

## 2017-04-24 NOTE — Telephone Encounter (Signed)
Pt saw cardiology and they changed some of her BP meds and pts BP has been running low; varying BP from day to day.BP 04/21/17 109/49 and today 140/76. Pt does not want to contact cardiology; pt wants Dr Glori Bickers to eval and decide what meds she should take. Pt scheduled appt today at 6 pm.

## 2017-04-24 NOTE — Progress Notes (Signed)
Subjective:    Patient ID: Erin Beck, female    DOB: December 05, 1940, 76 y.o.   MRN: [id number]  HPI  Here for HTN   Went to cardiology - was px metoprolol and pt is nervous about side effects   bp is very labile ranging from 109/49 to 140/76 She tends toward dizziness often     Wt Readings from Last 3 Encounters:  04/24/17 175 lb (79.4 kg)  04/02/17 176 lb 3.2 oz (79.9 kg)  03/08/17 175 lb (79.4 kg)   33.07 kg/m  BP Readings from Last 3 Encounters:  04/24/17 106/64  04/02/17 124/70  03/08/17 117/60    Takes lisinopril 10 mg (also for renal protection)  hctz 25 mg   Lab Results  Component Value Date   TSH 1.55 10/01/2016      Pulse Readings from Last 3 Encounters:  04/24/17 72  04/02/17 76  03/08/17 61    Recently saw cardiology c/o of palpitatins  Hx of CAD (no significant obstruction) Last holter monitor showed PACs and PVCs Nuclear stress 8/18 with LVEF 71% - re assuring   For GERD -rec inc prilosec to 20 mg bid   Intol of statins Lab Results  Component Value Date   CHOL 232 (H) 10/01/2016   HDL 44.60 10/01/2016   LDLCALC 154 (H) 10/01/2016   LDLDIRECT 154.0 06/10/2015   TRIG 170.0 (H) 10/01/2016   CHOLHDL 5 10/01/2016   She does have anxiety issues  She worries about her husband's health  Patient Active Problem List   Diagnosis Date Noted  . Palpitations 04/02/2017  . Non-cardiac chest pain 05/23/2016  . CAD (coronary artery disease), native coronary artery 05/23/2016  . LLQ abdominal pain 04/30/2016  . Chest pain 12/29/2015  . Abnormal stress test 12/29/2015  . Type 2 diabetes mellitus with circulatory disorder, without long-term current use of insulin ([place]) 06/17/2015  . Routine general medical examination at a health care facility 06/14/2015  . Hearing loss of aging 06/14/2015  . 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 Chronic neck and back pain 06/15/2014  . Right shoulder pain 03/12/2014  . Vitreomacular adhesion 05/05/2013  .  Unspecified asthma(493.90) 04/23/2013  . Personal history of colonic polyps 03/03/2013  . Post-menopausal 10/17/2012  . Obesity 10/31/2011  . IBS (irritable bowel syndrome) 08/10/2011  . GERD 07/31/2010  . Allergic rhinitis 12/05/2007  . HYPERTENSION, BENIGN ESSENTIAL 02/05/2007  . TOBACCO USE, QUIT 02/05/2007  . Hyperlipidemia 01/31/2007  . Anxiety 01/31/2007  . OSTEOARTHRITIS 01/31/2007  . DIVERTICULITIS, HX OF 01/31/2007   Past Medical History:  Diagnosis Date  . Allergic rhinitis   . Anxiety   . Arthritis    OA  . Asthma   . CAD (coronary artery disease), native coronary artery 05/23/2016   Non obstructive with 40% mid LAD and 60% ramus  . Complication of anesthesia    gas bubble rt eye from an old retinal detachment  . Diabetes mellitus    type II  . Diverticulitis    Hx of  . Full dentures   . GERD (gastroesophageal reflux disease)   . Hematuria    HX OF MICROSCOPIC BLOOD IN URINE AND HX OF CYST ON ONE KIDNEY--BUT NO KIDNEY DISEASE  . Hyperlipidemia   . Hypertension   . IBS (irritable bowel syndrome)   . Osteopenia   . Wears glasses    Past Surgical History:  Procedure Laterality Date  . ABDOMINAL HYSTERECTOMY    . APPENDECTOMY     per  pt  . CARDIAC CATHETERIZATION N/A 12/29/2015   Procedure: Left Heart Cath and Coronary Angiography;  Surgeon: Belva Crome, MD;  Location: [facility];  Service: Cardiovascular;  Laterality: N/A;  . CARPAL TUNNEL RELEASE     both hands  . CHOLECYSTECTOMY    . COLONOSCOPY    . EYE SURGERY     BILATERAL CATARACTS REMOVED  . EYE SURGERY     gas bubble in rt eye from a retinal detachment  . JOINT REPLACEMENT  2000   LEFT TOTAL KNEE ARTHROPLASTY  . JOINT REPLACEMENT  2013   rt total knee  . KNEE ARTHROSCOPY  1990   chondromalacia   . TOTAL KNEE ARTHROPLASTY  12/05/2011   Procedure: TOTAL KNEE ARTHROPLASTY;  Surgeon: Magnus Sinning, MD;  Location: [facility];  Service: Orthopedics;  Laterality: Right;  . ULNAR NERVE  TRANSPOSITION Right 09/15/2014   Procedure: Right cubital tunnel release with transposition of ulnar nerve;  Surgeon: Marianna Payment, MD;  Location: [facility];  Service: Orthopedics;  Laterality: Right;   Social History  Substance Use Topics  . Smoking status: Former Smoker    Packs/day: 1.00    Years: 48.00    Quit date: 08/20/2009  . Smokeless tobacco: Never Used  . Alcohol use No   Family History  Problem Relation Age of Onset  . Pneumonia Mother   . Diabetes Father   . Cancer Father        of the gallbladder  . Heart disease Maternal Grandmother   . Heart disease Paternal Grandmother   . Diabetes Brother    Allergies  Allergen Reactions  . Naproxen Rash    Trouble breathing  . Naproxen Sodium Rash    Trouble breathing  . Aspirin     REACTION: burns stomach PT STATES SHE DOES TOLERATE THE 81 MG ASPIRIN DAILY  . Atorvastatin     REACTION: muscle pain  . Buspar [Buspirone Hcl]     Multiple side eff  . Gabapentin Other (See Comments)    Palpitations/ dizziness   . Metformin And Related     Mm aches, palpitations, SOB, worse eyesight, loss of balance, sweating, low CBGs  . Norco [Hydrocodone-Acetaminophen] Other (See Comments)    Dizzy and problems remembering   . Pravastatin     Leg pain  . Simvastatin     REACTION: GI side eff and swelling  . Sulfa Antibiotics Other (See Comments)    Severe joint pain  . Sulfonamide Derivatives     REACTION: reaction not known   Current Outpatient Prescriptions on File Prior to Visit  Medication Sig Dispense Refill  . ACCU-CHEK FASTCLIX LANCETS MISC TEST FOUR TIMES DAILY AS DIRECTED 400 each 3  . ACCU-CHEK SMARTVIEW test strip USE TO TEST BLOOD SUGAR 2 TIMES DAILY AS INSTRUCTED. 200 each 3  . acetaminophen (TYLENOL) 325 MG tablet Take 650 mg by mouth every 6 (six) hours as needed (pain).    Marland Kitchen albuterol (VENTOLIN HFA) 108 (90 Base) MCG/ACT inhaler Inhale 2 puffs into the lungs every 6 (six) hours as needed for  wheezing or shortness of breath. Reported on 12/30/2015    . aspirin 81 MG tablet Take 81 mg by mouth daily.    . Blood Glucose Monitoring Suppl (ACCU-CHEK NANO SMARTVIEW) w/Device KIT Check blood sugars 1-2 times a day. 1 kit 0  . cetirizine (ZYRTEC) 10 MG tablet Take 10 mg by mouth daily as needed for allergies.    Marland Kitchen glipiZIDE (  GLIPIZIDE XL) 5 MG 24 hr tablet Take 1 tablet (5 mg total) by mouth daily with breakfast. 90 tablet 3  . hydrochlorothiazide (HYDRODIURIL) 25 MG tablet Take 25 mg by mouth daily.    . nitroGLYCERIN (NITROSTAT) 0.4 MG SL tablet Place 1 tablet (0.4 mg total) under the tongue every 5 (five) minutes as needed for chest pain. 25 tablet 3  . omeprazole (PRILOSEC) 20 MG capsule Take 1 capsule (20 mg total) by mouth daily. 90 capsule 3  . terconazole (TERAZOL 3) 0.8 % vaginal cream Apply to affected vaginal areas once daily 20 g 0  . ezetimibe (ZETIA) 10 MG tablet Take 1 tablet (10 mg total) by mouth daily. 90 tablet 3  . metoprolol succinate (TOPROL XL) 25 MG 24 hr tablet Take 1 tablet (25 mg total) by mouth daily. (Patient not taking: Reported on 04/24/2017) 30 tablet 11   No current facility-administered medications on file prior to visit.     Review of Systems    Review of Systems  Constitutional: Negative for fever, appetite change, fatigue and unexpected weight change.  Eyes: Negative for pain and visual disturbance.  Respiratory: Negative for cough and shortness of breath.   Cardiovascular: pos for cp and palpitations -intermittent with reassuring cardiology w/u Gastrointestinal: Negative for nausea, diarrhea and constipation.  Genitourinary: Negative for urgency and frequency.  Skin: Negative for pallor or rash   Neurological: Negative for weakness, light-headedness, numbness and headaches.  Hematological: Negative for adenopathy. Does not bruise/bleed easily.  Psychiatric/Behavioral: Negative for dysphoric mood. The patient is quite nervous/anxious.        Objective:   Physical Exam  Constitutional: She appears well-developed and well-nourished. No distress.  obese and well appearing   HENT:  Head: Normocephalic and atraumatic.  Mouth/Throat: Oropharynx is clear and moist.  Eyes: Pupils are equal, round, and reactive to light. Conjunctivae and EOM are normal.  Neck: Normal range of motion. Neck supple. No JVD present. Carotid bruit is not present. No thyromegaly present.  Cardiovascular: Normal rate, regular rhythm, normal heart sounds and intact distal pulses.  Exam reveals no gallop.   Pulmonary/Chest: Effort normal and breath sounds normal. No respiratory distress. She has no wheezes. She has no rales.  No crackles  Abdominal: Soft. Bowel sounds are normal. She exhibits no distension, no abdominal bruit and no mass. There is no tenderness.  Musculoskeletal: She exhibits no edema.  Lymphadenopathy:    She has no cervical adenopathy.  Neurological: She is alert. She has normal reflexes.  Skin: Skin is warm and dry. No rash noted. No pallor.  Psychiatric: Her speech is normal and behavior is normal. Her mood appears anxious. Her affect is not blunt and not inappropriate. Thought content is not paranoid. Cognition and memory are normal. She does not exhibit a depressed mood. She expresses no homicidal and no suicidal ideation.  Tearful at times when disc stressors pleasant          Assessment & Plan:   Problem List Items Addressed This Visit      Cardiovascular and Mediastinum   HYPERTENSION, BENIGN ESSENTIAL - Primary    Labile bp  Pt was px low dose metoprolol er for palpitations from cardiology and is afraid it will lower bp too much  Rev cardiology notes and pt was quite confused about this  Will plan to cut lisinopril to 5 mg daily- watch bp for a week and then add the metoprolol Pt will update if low bp or other side eff  Relevant Medications   lisinopril (PRINIVIL,ZESTRIL) 10 MG tablet     Other   Anxiety     Reviewed stressors/ coping techniques/symptoms/ support sources/ tx options and side effects in detail today Worse lately  ? Adding to palpitations  Trial of metoprolol  Disc consideration of counseling and trial of meditation  Exercise as tolerated       Palpitations    Rev cardiology notes/studies PAC/PVCs on her holter Reassuring stress test  Was px metopolol er 25 mg - pt was afraid of hypotension (and confused as to why it was given to her)  Plan to cut lisinopril dose in 1/2  Then if doing well after a week add the metoprolol - watching bp and pulse  Will hold if side eff or hypotension and update Also update if it does not help her palpitaions

## 2017-04-24 NOTE — Patient Instructions (Signed)
Cut your lisinopril in 1/2 (with a pill cutter) and take 1/2 pill daily  Keep track of your blood pressure and see how you feel  After a week go ahead and start the metoprolol ER for your palpitations and take it daily  If your blood pressure gets too low (routinely below 90/50) or if you feel dizzy or have other side effects please let me know

## 2017-04-24 NOTE — Telephone Encounter (Signed)
I will see her then  

## 2017-04-25 NOTE — Assessment & Plan Note (Signed)
Rev cardiology notes/studies PAC/PVCs on her holter Reassuring stress test  Was px metopolol er 25 mg - pt was afraid of hypotension (and confused as to why it was given to her)  Plan to cut lisinopril dose in 1/2  Then if doing well after a week add the metoprolol - watching bp and pulse  Will hold if side eff or hypotension and update Also update if it does not help her palpitaions

## 2017-04-25 NOTE — Assessment & Plan Note (Addendum)
Labile bp  Pt was px low dose metoprolol er for palpitations from cardiology and is afraid it will lower bp too much  Rev cardiology notes and pt was quite confused about this  Will plan to cut lisinopril to 5 mg daily- watch bp for a week and then add the metoprolol Pt will update if low bp or other side eff

## 2017-04-25 NOTE — Assessment & Plan Note (Signed)
Reviewed stressors/ coping techniques/symptoms/ support sources/ tx options and side effects in detail today Worse lately  ? Adding to palpitations  Trial of metoprolol  Disc consideration of counseling and trial of meditation  Exercise as tolerated

## 2017-05-09 ENCOUNTER — Telehealth: Payer: Self-pay

## 2017-05-09 NOTE — Telephone Encounter (Signed)
Stay off if it and let your cardiologist know  Sorry it did not work out

## 2017-05-09 NOTE — Telephone Encounter (Signed)
Pt left /vm; pt seen 04/24/17; pt cannot take metoprolol due to make pt extremely dizzy. On 05/07/17 BP 132/79 P 80. On 05/06/17 BP 101/did not leave diastolic P 56. Pt said most times taking BP the BP has been low while taking metoprolol. Pt has stopped taking the Metoprolol. [facility] pharmacy. Pt request cb.

## 2017-05-10 ENCOUNTER — Ambulatory Visit (INDEPENDENT_AMBULATORY_CARE_PROVIDER_SITE_OTHER): Payer: Medicare HMO | Admitting: Family Medicine

## 2017-05-10 ENCOUNTER — Encounter: Payer: Self-pay | Admitting: Family Medicine

## 2017-05-10 VITALS — BP 136/66 | HR 79 | Temp 98.0°F | Ht 61.0 in | Wt 174.5 lb

## 2017-05-10 DIAGNOSIS — F419 Anxiety disorder, unspecified: Secondary | ICD-10-CM | POA: Diagnosis not present

## 2017-05-10 DIAGNOSIS — I10 Essential (primary) hypertension: Secondary | ICD-10-CM

## 2017-05-10 DIAGNOSIS — T63441A Toxic effect of venom of bees, accidental (unintentional), initial encounter: Secondary | ICD-10-CM | POA: Insufficient documentation

## 2017-05-10 DIAGNOSIS — T63451A Toxic effect of venom of hornets, accidental (unintentional), initial encounter: Secondary | ICD-10-CM

## 2017-05-10 DIAGNOSIS — T63461A Toxic effect of venom of wasps, accidental (unintentional), initial encounter: Secondary | ICD-10-CM | POA: Diagnosis not present

## 2017-05-10 MED ORDER — MUPIROCIN 2 % EX OINT: 1.0000 "application " | TOPICAL_OINTMENT | Freq: Two times a day (BID) | CUTANEOUS | 0 refills | Status: DC

## 2017-05-10 MED ORDER — PAROXETINE HCL 10 MG PO TABS: 5.0000 mg | ORAL_TABLET | Freq: Every day | ORAL | 11 refills | Status: DC

## 2017-05-10 NOTE — Assessment & Plan Note (Signed)
On R arm- occurred late aug  Small scab remains-no fb seen and no drainage or s/s of infection  Px bactroban oint inst to use soap and water bid (not peroxide) and oint bid Update if not starting to improve in a week or if worsening

## 2017-05-10 NOTE — Assessment & Plan Note (Signed)
bp in fair control at this time  BP Readings from Last 1 Encounters:  05/10/17 136/66   No changes needed Disc lifstyle change with low sodium diet and exercise  On 1/2 dose of lisinopril feels better  Did not tolerate metoprolol -multiple side eff
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
## 2017-05-10 NOTE — Telephone Encounter (Signed)
Pt notified of Dr. Marliss Coots comments med removed from med list
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
## 2017-05-13 ENCOUNTER — Telehealth: Payer: Self-pay

## 2017-05-13 NOTE — Telephone Encounter (Signed)
[facility] left v/m ins is not covering paroxetine and [facility] wants to know if going to do prior auth for paroxetine or have therapy change.Please advise.

## 2017-05-13 NOTE — Telephone Encounter (Signed)
That is fine-please send me the prior auth

## 2017-05-14 NOTE — Telephone Encounter (Signed)
Left voicemail letting pharmacy know Dr. Glori Bickers wants to do PA and I requested them send [place] the fax saying Rx needs PA

## 2017-05-16 NOTE — Telephone Encounter (Signed)
PA submitted at [email or web address], I will await a response

## 2017-05-17 NOTE — Telephone Encounter (Signed)
PA was approved, letter faxed to pharmacy and then placed in Dr. Marliss Coots inbox to sign and send for scanning

## 2017-06-26 ENCOUNTER — Other Ambulatory Visit: Payer: Self-pay | Admitting: Family Medicine

## 2017-06-28 ENCOUNTER — Ambulatory Visit: Payer: Medicare HMO | Admitting: Internal Medicine

## 2017-07-03 ENCOUNTER — Ambulatory Visit: Payer: Medicare HMO | Admitting: Cardiology

## 2017-09-06 ENCOUNTER — Ambulatory Visit: Payer: Medicare HMO | Admitting: Internal Medicine

## 2017-09-06 ENCOUNTER — Encounter: Payer: Self-pay | Admitting: Internal Medicine

## 2017-09-06 VITALS — BP 140/80 | HR 79 | Ht 61.0 in | Wt 178.6 lb

## 2017-09-06 DIAGNOSIS — E785 Hyperlipidemia, unspecified: Secondary | ICD-10-CM

## 2017-09-06 DIAGNOSIS — E6609 Other obesity due to excess calories: Secondary | ICD-10-CM

## 2017-09-06 DIAGNOSIS — Z6833 Body mass index (BMI) 33.0-33.9, adult: Secondary | ICD-10-CM

## 2017-09-06 DIAGNOSIS — E1159 Type 2 diabetes mellitus with other circulatory complications: Secondary | ICD-10-CM | POA: Diagnosis not present

## 2017-09-06 LAB — POCT GLYCOSYLATED HEMOGLOBIN (HGB A1C): Hemoglobin A1C: 6.4

## 2017-09-06 MED ORDER — GLUCOSE BLOOD VI STRP: ORAL_STRIP | 3 refills | Status: DC

## 2017-09-06 MED ORDER — ACCU-CHEK FASTCLIX LANCETS MISC: 3 refills | Status: DC

## 2017-09-06 NOTE — Progress Notes (Signed)
Patient ID: Erin Beck, female   DOB: October 27, 1940, 77 y.o.   MRN: [id number]  HPI: Erin Beck is a 77 y.o.-year-old female, returning for f/u for DM2, non-insulin-dependent, dx 2005, uncontrolled, with complications (CAD). Last visit 4 mo ago.  Her R knee is giving out >> walks with a walker now.  She has a history of bilateral hip replacement, the right knee was replaced in 2013.  Her husband has an aggressive form of bladder CA.  Patient is understandably very affected by this and cries in the office today.  Last hemoglobin A1c was: Lab Results  Component Value Date   HGBA1C 6.7 02/25/2017   HGBA1C 6.3 11/02/2016   HGBA1C 6.2 07/06/2016  Labs from Dr. Erlinda Hong [address], 08/30/2014): HbA1c 6.9%, decreased from 8.0%  Pt is on a regimen of: - Glipizide XL 5 mg in am Stopped Metformin ER 1500 mg in pm before cath but she was not feeling well on it (diarrhea, weakness, thirst, leg swelling, blurry vision) She has not been on insulin before, would not like to stick herself.  Pt checks her sugars 2-3x a day: - am: 89, 92-134 >> 107-127 >> 108-138 >> 102-135 - 2h after b'fast: 118-144 >> 97, 154 >> n/c - before lunch: 80-134 >> 82-138, 155 >> 65, 88-128 - 2h after lunch: 76, 111-140 >> 93-154 >> n/c - before dinner: 84, 105-136 >> 80-131 >> 89-124 - 2h after dinner: 121-150 >> 110-140, 168 >> n/c - bedtime: 92-134 >> 99-125 >> 132 >> 121, 137 - nighttime:  99-138 >> 114-122 >> 121-127 Lowest sugar was 80 >> 65 x1;  she has hypoglycemia awareness at 80.   Highest sugar was 155 >> 137.  Pt's meals are: - Breakfast: cereals - Lunch: salads, 1 sandwich, leftovers - Dinner: salmon/chicken, vegetables - Snacks: 1 fruit, crackers - not every day No soft drinks.  - No CKD, last BUN/creatinine:  Lab Results  Component Value Date   BUN 17 03/08/2017   CREATININE 0.73 03/08/2017  On Lisinopril. - + HL; last set of lipids: Lab Results  Component Value Date   CHOL 232 (H)  10/01/2016   HDL 44.60 10/01/2016   LDLCALC 154 (H) 10/01/2016   LDLDIRECT 154.0 06/10/2015   TRIG 170.0 (H) 10/01/2016   CHOLHDL 5 10/01/2016  Tried statins >> AP. - last eye exam was in 04/2016 >> No DR. H/o cataract. - No numbness and tingling in her feet.  ROS: Constitutional: no weight gain/no weight loss, no fatigue, no subjective hyperthermia, no subjective hypothermia Eyes: no blurry vision, no xerophthalmia ENT: no sore throat, no nodules palpated in throat, no dysphagia, no odynophagia, no hoarseness Cardiovascular: no CP/no SOB/no palpitations/no leg swelling Respiratory: no cough/no SOB/no wheezing Gastrointestinal: no N/no V/no D/no C/no acid reflux Musculoskeletal: no muscle aches/+ joint aches Skin: no rashes, no hair loss Neurological: no tremors/no numbness/no tingling/no dizziness  I reviewed pt's medications, allergies, PMH, social hx, family hx, and changes were documented in the history of present illness. Otherwise, unchanged from my initial visit note.  PE: BP 140/80   Pulse 79   Ht 5\' 1"  (1.549 m)   Wt 178 lb 9.6 oz (81 kg)   SpO2 98%   BMI 33.75 kg/m  Body mass index is 33.75 kg/m. Wt Readings from Last 3 Encounters:  09/06/17 178 lb 9.6 oz (81 kg)  05/10/17 174 lb 8 oz (79.2 kg)  04/24/17 175 lb (79.4 kg)   Constitutional: overweight, in NAD, walks with a walker  Eyes: PERRLA, EOMI, no exophthalmos ENT: moist mucous membranes, no thyromegaly, no cervical lymphadenopathy Cardiovascular: RRR, No MRG Respiratory: CTA B Gastrointestinal: abdomen soft, NT, ND, BS+ Musculoskeletal: no deformities, strength intact in all 4 Skin: moist, warm, no rashes Neurological: no tremor with outstretched hands, DTR normal in all 4  ASSESSMENT: 1. DM2, non-insulin-dependent, uncontrolled, with complications - CAD  2. HL  3.  Obesity  PLAN:  1. Patient with long-standing, well controlled, diabetes, on glipizide XL only.  She was previously on metformin but  stopped this and started feeling much better after stopping the medication.  Sugars are at or close to goal.  Sugar log which we reviewed today.  No significant hyperglycemic spikes, and also no lows. -We will continue the current regimen for now - I suggested to:  Patient Instructions  Please continue - Glipizide XL 5 mg in am  Please return in 6 months with your sugar log.   - today, HbA1c is 6.4% (improved) - continue checking sugars at different times of the day - check 1x a day, rotating checks - advised for yearly eye exams >> she is not UTD - Return to clinic in 3 mo with sugar log   2. HL - Reviewed latest lipid panel from 09/2016: LDL quite high, at 154 - She is intolerant to statins - She is trying to adjust her diet to improve both her diabetes and cholesterol level  3. Obesity - She gained few pounds since last visit - Working on adjusting her diet  Philemon Kingdom, MD PhD [facility]

## 2017-09-06 NOTE — Patient Instructions (Signed)
Please continue - Glipizide XL 5 mg in am  Please return in 6 months with your sugar log.

## 2017-10-01 ENCOUNTER — Telehealth: Payer: Self-pay | Admitting: Internal Medicine

## 2017-10-01 DIAGNOSIS — E1159 Type 2 diabetes mellitus with other circulatory complications: Secondary | ICD-10-CM

## 2017-10-01 DIAGNOSIS — Z794 Long term (current) use of insulin: Principal | ICD-10-CM

## 2017-10-01 NOTE — Telephone Encounter (Signed)
Pt needs a new meter sent in.  °She stares that the one she has stopped working.  ° ° °Blood Glucose Monitoring Suppl (ACCU-CHEK NANO SMARTVIEW) w/Device KIT ° ° ° °[facility] - [place], [place] - [address] °

## 2017-10-02 MED ORDER — ACCU-CHEK NANO SMARTVIEW W/DEVICE KIT: PACK | 0 refills | Status: DC

## 2017-10-11 ENCOUNTER — Other Ambulatory Visit: Payer: Self-pay

## 2017-10-11 DIAGNOSIS — Z794 Long term (current) use of insulin: Principal | ICD-10-CM

## 2017-10-11 DIAGNOSIS — E1159 Type 2 diabetes mellitus with other circulatory complications: Secondary | ICD-10-CM

## 2017-10-11 MED ORDER — ACCU-CHEK FASTCLIX LANCETS MISC: 3 refills | Status: DC

## 2017-10-11 MED ORDER — ACCU-CHEK NANO SMARTVIEW W/DEVICE KIT: PACK | 0 refills | Status: DC

## 2017-10-11 MED ORDER — GLUCOSE BLOOD VI STRP: ORAL_STRIP | 3 refills | Status: DC

## 2017-10-16 ENCOUNTER — Ambulatory Visit (INDEPENDENT_AMBULATORY_CARE_PROVIDER_SITE_OTHER): Payer: Medicare HMO | Admitting: Family Medicine

## 2017-10-16 ENCOUNTER — Ambulatory Visit: Payer: Self-pay

## 2017-10-16 ENCOUNTER — Encounter: Payer: Self-pay | Admitting: Family Medicine

## 2017-10-16 VITALS — BP 140/80 | HR 78 | Temp 97.9°F | Wt 179.2 lb

## 2017-10-16 DIAGNOSIS — R1032 Left lower quadrant pain: Secondary | ICD-10-CM | POA: Diagnosis not present

## 2017-10-16 LAB — CBC WITH DIFFERENTIAL/PLATELET
BASOS ABS: 0 10*3/uL (ref 0.0–0.1)
Basophils Relative: 0.4 % (ref 0.0–3.0)
EOS ABS: 0.2 10*3/uL (ref 0.0–0.7)
Eosinophils Relative: 2.2 % (ref 0.0–5.0)
HEMATOCRIT: 43.8 % (ref 36.0–46.0)
HEMOGLOBIN: 14.8 g/dL (ref 12.0–15.0)
LYMPHS PCT: 13 % (ref 12.0–46.0)
Lymphs Abs: 1.3 10*3/uL (ref 0.7–4.0)
MCHC: 33.7 g/dL (ref 30.0–36.0)
MCV: 91.9 fl (ref 78.0–100.0)
MONOS PCT: 8.4 % (ref 3.0–12.0)
Monocytes Absolute: 0.8 10*3/uL (ref 0.1–1.0)
Neutro Abs: 7.3 10*3/uL (ref 1.4–7.7)
Neutrophils Relative %: 76 % (ref 43.0–77.0)
PLATELETS: 350 10*3/uL (ref 150.0–400.0)
RBC: 4.76 Mil/uL (ref 3.87–5.11)
RDW: 13.1 % (ref 11.5–15.5)
WBC: 9.6 10*3/uL (ref 4.0–10.5)

## 2017-10-16 LAB — POCT URINALYSIS DIPSTICK
BILIRUBIN UA: NEGATIVE
Glucose, UA: NEGATIVE
Ketones, UA: NEGATIVE
LEUKOCYTES UA: NEGATIVE
Nitrite, UA: NEGATIVE
PH UA: 6.5 (ref 5.0–8.0)
PROTEIN UA: NEGATIVE
Spec Grav, UA: 1.01 (ref 1.010–1.025)
UROBILINOGEN UA: 0.2 U/dL

## 2017-10-16 MED ORDER — CIPROFLOXACIN HCL 500 MG PO TABS: 500.0000 mg | ORAL_TABLET | Freq: Two times a day (BID) | ORAL | 0 refills | Status: DC

## 2017-10-16 MED ORDER — METRONIDAZOLE 500 MG PO TABS
500.0000 mg | ORAL_TABLET | Freq: Three times a day (TID) | ORAL | 0 refills | Status: DC
Start: 2017-10-16 — End: 2017-12-02

## 2017-10-16 NOTE — Progress Notes (Signed)
Subjective:     Patient ID: Erin Beck, female   DOB: 06/06/1941, 77 y.o.   MRN: [id number]  HPI Patient seen as a work in with left mid to lower quadrant abdominal pain. Onset of symptoms about 3 days ago. Somewhat progressive. She describes a sharp pain which is worse with palpation and certain movements. She's had some slight nausea but no vomiting. No dysuria. She's had normal bowel movements and no associated blood in bowel movements. Last bowel movement was yesterday. Denies any fever or chills. No skin rash. No history of kidney stones.  Patient does have history of diverticular disease with scattered diverticula on CT abdomen and pelvis 2010. She was seen by her primary September 2017 with very similar pain. Presumed acute diverticulitis. She was treated with Cipro and Flagyl and seemed to improve  Past Medical History:  Diagnosis Date  . Allergic rhinitis   . Anxiety   . Arthritis    OA  . Asthma   . CAD (coronary artery disease), native coronary artery 05/23/2016   Non obstructive with 40% mid LAD and 60% ramus  . Complication of anesthesia    gas bubble rt eye from an old retinal detachment  . Diabetes mellitus    type II  . Diverticulitis    Hx of  . Full dentures   . GERD (gastroesophageal reflux disease)   . Hematuria    HX OF MICROSCOPIC BLOOD IN URINE AND HX OF CYST ON ONE KIDNEY--BUT NO KIDNEY DISEASE  . Hyperlipidemia   . Hypertension   . IBS (irritable bowel syndrome)   . Osteopenia   . Wears glasses    Past Surgical History:  Procedure Laterality Date  . ABDOMINAL HYSTERECTOMY    . APPENDECTOMY     per pt  . CARDIAC CATHETERIZATION N/A 12/29/2015   Procedure: Left Heart Cath and Coronary Angiography;  Surgeon: Belva Crome, MD;  Location: [facility];  Service: Cardiovascular;  Laterality: N/A;  . CARPAL TUNNEL RELEASE     both hands  . CHOLECYSTECTOMY    . COLONOSCOPY    . EYE SURGERY     BILATERAL CATARACTS REMOVED  . EYE SURGERY     gas  bubble in rt eye from a retinal detachment  . JOINT REPLACEMENT  2000   LEFT TOTAL KNEE ARTHROPLASTY  . JOINT REPLACEMENT  2013   rt total knee  . KNEE ARTHROSCOPY  1990   chondromalacia   . TOTAL KNEE ARTHROPLASTY  12/05/2011   Procedure: TOTAL KNEE ARTHROPLASTY;  Surgeon: Magnus Sinning, MD;  Location: [facility];  Service: Orthopedics;  Laterality: Right;  . ULNAR NERVE TRANSPOSITION Right 09/15/2014   Procedure: Right cubital tunnel release with transposition of ulnar nerve;  Surgeon: Marianna Payment, MD;  Location: [facility];  Service: Orthopedics;  Laterality: Right;    reports that she quit smoking about 8 years ago. She has a 48.00 pack-year smoking history. she has never used smokeless tobacco. She reports that she does not drink alcohol or use drugs. family history includes Cancer in her father; Diabetes in her brother and father; Heart disease in her maternal grandmother and paternal grandmother; Pneumonia in her mother. Allergies  Allergen Reactions  . Naproxen Rash    Trouble breathing  . Naproxen Sodium Rash    Trouble breathing  . Aspirin     REACTION: burns stomach PT STATES SHE DOES TOLERATE THE 81 MG ASPIRIN DAILY  . Atorvastatin  REACTION: muscle pain  . Buspar [Buspirone Hcl]     Multiple side eff  . Gabapentin Other (See Comments)    Palpitations/ dizziness   . Metformin And Related     Mm aches, palpitations, SOB, worse eyesight, loss of balance, sweating, low CBGs  . Metoprolol Other (See Comments)    dizziness  . Norco [Hydrocodone-Acetaminophen] Other (See Comments)    Dizzy and problems remembering   . Pravastatin     Leg pain  . Simvastatin     REACTION: GI side eff and swelling  . Sulfa Antibiotics Other (See Comments)    Severe joint pain  . Sulfonamide Derivatives     REACTION: reaction not known     Review of Systems  Constitutional: Positive for appetite change. Negative for chills and fever.  Cardiovascular: Negative  for chest pain.  Gastrointestinal: Positive for abdominal pain. Negative for abdominal distention, blood in stool, constipation, diarrhea and vomiting.  Genitourinary: Negative for dysuria and hematuria.  Skin: Negative for rash.  Neurological: Negative for weakness.       Objective:   Physical Exam  Constitutional: She appears well-developed and well-nourished.  HENT:  Mouth/Throat: Oropharynx is clear and moist.  Cardiovascular: Normal rate and regular rhythm.  Pulmonary/Chest: Effort normal and breath sounds normal.  Abdominal: Soft. Bowel sounds are normal. She exhibits no mass. There is tenderness. There is no rebound.  Neurological: She is alert.  Skin: No rash noted.       Assessment:     Patient seen with 3 day history of left lower quadrant abdominal pain. Symptoms are very similar to previous episode of presumed acute diverticulitis. Doubt kidney stones. Abdomen is nonacute at this time    Plan:     -Check CBC with differential. Urine dipstick revealed trace blood otherwise negative. -Start Cipro 500 mg twice daily and Flagyl 500 mg 3 times a day for the next 10 days -Consider over-the-counter probiotic such as Align -Follow-up immediately for any increasing fever or worsening abdominal pain -Would have low threshold to consider CT abdomen/ pelvis for any worsening pain  Eulas Post MD [facility]

## 2017-10-16 NOTE — Patient Instructions (Signed)
Diverticulitis °Diverticulitis is infection or inflammation of small pouches (diverticula) in the colon that form due to a condition called diverticulosis. Diverticula can trap stool (feces) and bacteria, causing infection and inflammation. °Diverticulitis may cause severe stomach pain and diarrhea. It may lead to tissue damage in the colon that causes bleeding. The diverticula may also burst (rupture) and cause infected stool to enter other areas of the abdomen. °Complications of diverticulitis can include: °· Bleeding. °· Severe infection. °· Severe pain. °· Rupture (perforation) of the colon. °· Blockage (obstruction) of the colon. ° °What are the causes? °This condition is caused by stool becoming trapped in the diverticula, which allows bacteria to grow in the diverticula. This leads to inflammation and infection. °What increases the risk? °You are more likely to develop this condition if: °· You have diverticulosis. The risk for diverticulosis increases if: °? You are overweight or obese. °? You use tobacco products. °? You do not get enough exercise. °· You eat a diet that does not include enough fiber. High-fiber foods include fruits, vegetables, beans, nuts, and whole grains. ° °What are the signs or symptoms? °Symptoms of this condition may include: °· Pain and tenderness in the abdomen. The pain is normally located on the left side of the abdomen, but it may occur in other areas. °· Fever and chills. °· Bloating. °· Cramping. °· Nausea. °· Vomiting. °· Changes in bowel routines. °· Blood in your stool. ° °How is this diagnosed? °This condition is diagnosed based on: °· Your medical history. °· A physical exam. °· Tests to make sure there is nothing else causing your condition. These tests may include: °? Blood tests. °? Urine tests. °? Imaging tests of the abdomen, including X-rays, ultrasounds, MRIs, or CT scans. ° °How is this treated? °Most cases of this condition are mild and can be treated at home.  Treatment may include: °· Taking over-the-counter pain medicines. °· Following a clear liquid diet. °· Taking antibiotic medicines by mouth. °· Rest. ° °More severe cases may need to be treated at a hospital. Treatment may include: °· Not eating or drinking. °· Taking prescription pain medicine. °· Receiving antibiotic medicines through an IV tube. °· Receiving fluids and nutrition through an IV tube. °· Surgery. ° °When your condition is under control, your health care provider may recommend that you have a colonoscopy. This is an exam to look at the entire large intestine. During the exam, a lubricated, bendable tube is inserted into the anus and then passed into the rectum, colon, and other parts of the large intestine. A colonoscopy can show how severe your diverticula are and whether something else may be causing your symptoms. °Follow these instructions at home: °Medicines °· Take over-the-counter and prescription medicines only as told by your health care provider. These include fiber supplements, probiotics, and stool softeners. °· If you were prescribed an antibiotic medicine, take it as told by your health care provider. Do not stop taking the antibiotic even if you start to feel better. °· Do not drive or use heavy machinery while taking prescription pain medicine. °General instructions °· Follow a full liquid diet or another diet as directed by your health care provider. After your symptoms improve, your health care provider may tell you to change your diet. He or she may recommend that you eat a diet that contains at least 25 g (25 grams) of fiber daily. Fiber makes it easier to pass stool. Healthy sources of fiber include: °? Berries. One cup   contains 4-8 grams of fiber. °? Beans or lentils. One half cup contains 5-8 grams of fiber. °? Green vegetables. One cup contains 4 grams of fiber. °· Exercise for at least 30 minutes, 3 times each week. You should exercise hard enough to raise your heart rate and  break a sweat. °· Keep all follow-up visits as told by your health care provider. This is important. You may need a colonoscopy. °Contact a health care provider if: °· Your pain does not improve. °· You have a hard time drinking or eating food. °· Your bowel movements do not return to normal. °Get help right away if: °· Your pain gets worse. °· Your symptoms do not get better with treatment. °· Your symptoms suddenly get worse. °· You have a fever. °· You vomit more than one time. °· You have stools that are bloody, black, or tarry. °Summary °· Diverticulitis is infection or inflammation of small pouches (diverticula) in the colon that form due to a condition called diverticulosis. Diverticula can trap stool (feces) and bacteria, causing infection and inflammation. °· You are at higher risk for this condition if you have diverticulosis and you eat a diet that does not include enough fiber. °· Most cases of this condition are mild and can be treated at home. More severe cases may need to be treated at a hospital. °· When your condition is under control, your health care provider may recommend that you have an exam called a colonoscopy. This exam can show how severe your diverticula are and whether something else may be causing your symptoms. °This information is not intended to replace advice given to you by your health care provider. Make sure you discuss any questions you have with your health care provider. °Document Released: 05/16/2005 Document Revised: 09/08/2016 Document Reviewed: 09/08/2016 °Elsevier Interactive Patient Education © 2018 Elsevier Inc. ° °

## 2017-10-16 NOTE — Telephone Encounter (Signed)
Patient called in with c/o "abdominal pain." She says "It hurts under my left breast, feeling like gas, and it goes to my lower abdomen right where my fingers would be when I have my hand on my waist. It started yesterday then eased. It was there all night, I got up then rested. When I woke up, I was in cold sweats and it hurts to take a deep breath. My abdomen feels full. I noticed my urine is darker than it normally is, a darker yellow, yesterday and today. I checked my temp and it was 98.5. I've been crying because of the pain. It's about a 9-10 at the worst pain. I have diverticulitis and I'm wondering if that's what it is, I don't know. I just need to be seen today." I asked is she having chest pain, shortness of breath, vomiting, constipation, diarrhea, nausea, she says "I feel a little nauseated, but not like I will throw up. I had 2 bowel movements yesterday and they were not constipated, normal in color. I don't have constipation, diarrhea, vomiting, chest pain or SOB." According to protocol, see PCP within 24 hours, no availability with PCP or practice, I offered to send her to another location or go to the UC/ED, she said "I can go to [place], if they have availability. I don't want my husband to drive to [facility]." Appointment made today at 1400 with Dr. Elease Hashimoto, care advice given, patient verbalized understanding.  Reason for Disposition . Age > 60 years  Answer Assessment - Initial Assessment Questions 1. LOCATION: "Where does it hurt?"      Left lower abdomen  2. RADIATION: "Does the pain shoot anywhere else?" (e.g., chest, back)     Under left breast, feels like gas 3. ONSET: "When did the pain begin?" (e.g., minutes, hours or days ago)      Started yesterday 4. SUDDEN: "Gradual or sudden onset?"     Gradually got worse since yesterday 5. PATTERN "Does the pain come and go, or is it constant?"    - If constant: "Is it getting better, staying the same, or worsening?"      (Note:  Constant means the pain never goes away completely; most serious pain is constant and it progresses)     - If intermittent: "How long does it last?" "Do you have pain now?"     (Note: Intermittent means the pain goes away completely between bouts)     Pain eased, but got worse as time went on 6. SEVERITY: "How bad is the pain?"  (e.g., Scale 1-10; mild, moderate, or severe)   - MILD (1-3): doesn't interfere with normal activities, abdomen soft and not tender to touch    - MODERATE (4-7): interferes with normal activities or awakens from sleep, tender to touch    - SEVERE (8-10): excruciating pain, doubled over, unable to do any normal activities      9-10 7. RECURRENT SYMPTOM: "Have you ever had this type of abdominal pain before?" If so, ask: "When was the last time?" and "What happened that time?"      Yes when diverticulitis flares up 8. CAUSE: "What do you think is causing the abdominal pain?"     It might be the diverticulitis 9. RELIEVING/AGGRAVATING FACTORS: "What makes it better or worse?" (e.g., movement, antacids, bowel movement)     I don't know 10. OTHER SYMPTOMS: "Has there been any vomiting, diarrhea, constipation, or urine problems?"       Urine is more yellow  yesterday and today; drink plenty of water, a little nauseated 11. PREGNANCY: "Is there any chance you are pregnant?" "When was your last menstrual period?"       No  Protocols used: ABDOMINAL PAIN - [facility]

## 2017-10-23 ENCOUNTER — Telehealth: Payer: Self-pay

## 2017-10-23 NOTE — Telephone Encounter (Signed)
Pt not available. Left messge to rtn call with man that answered.

## 2017-10-24 ENCOUNTER — Telehealth: Payer: Self-pay | Admitting: Internal Medicine

## 2017-10-24 MED ORDER — ONETOUCH VERIO W/DEVICE KIT: 1.0000 | PACK | 0 refills | Status: DC

## 2017-10-24 MED ORDER — GLUCOSE BLOOD VI STRP: ORAL_STRIP | 12 refills | Status: DC

## 2017-10-24 MED ORDER — ONETOUCH ULTRASOFT LANCETS MISC: 12 refills | Status: DC

## 2017-10-24 NOTE — Telephone Encounter (Signed)
Spoke to patient. Requested new meter due to recall by [employer] manufacturer. Sent Rx to [facility] as requested.

## 2017-10-24 NOTE — Telephone Encounter (Signed)
Blood Glucose Monitoring Suppl (ACCU-CHEK NANO SMARTVIEW) w/Device KIT Patient states she needs a different meter sent in because she states that the meter above is being discontinued.  She is also needing the lancets and test strips that will go with a new meter.   Please advise    [place], [place]

## 2017-10-28 NOTE — Telephone Encounter (Signed)
No answer. Called pt to verify if checking b/s two times daily or four times daily. Pharmacy is questioning for Accu Check [employer] Rx.

## 2017-10-29 NOTE — Telephone Encounter (Signed)
Called patient.  No answer.

## 2017-11-25 ENCOUNTER — Ambulatory Visit: Payer: Medicare HMO

## 2017-11-25 ENCOUNTER — Other Ambulatory Visit: Payer: Self-pay

## 2017-11-25 ENCOUNTER — Telehealth: Payer: Self-pay | Admitting: Family Medicine

## 2017-11-25 ENCOUNTER — Ambulatory Visit (INDEPENDENT_AMBULATORY_CARE_PROVIDER_SITE_OTHER): Payer: Medicare HMO

## 2017-11-25 VITALS — BP 122/80 | HR 80 | Temp 97.8°F | Ht 60.5 in | Wt 183.2 lb

## 2017-11-25 DIAGNOSIS — E785 Hyperlipidemia, unspecified: Secondary | ICD-10-CM

## 2017-11-25 DIAGNOSIS — Z0000 Encounter for general adult medical examination without abnormal findings: Secondary | ICD-10-CM | POA: Diagnosis not present

## 2017-11-25 DIAGNOSIS — I10 Essential (primary) hypertension: Secondary | ICD-10-CM

## 2017-11-25 LAB — COMPREHENSIVE METABOLIC PANEL
ALT: 26 U/L (ref 0–35)
AST: 26 U/L (ref 0–37)
Albumin: 4.1 g/dL (ref 3.5–5.2)
Alkaline Phosphatase: 55 U/L (ref 39–117)
BUN: 13 mg/dL (ref 6–23)
CO2: 28 meq/L (ref 19–32)
CREATININE: 0.82 mg/dL (ref 0.40–1.20)
Calcium: 9.2 mg/dL (ref 8.4–10.5)
Chloride: 101 mEq/L (ref 96–112)
GFR: 71.93 mL/min (ref >60.00)
Glucose, Bld: 160 mg/dL — ABNORMAL HIGH (ref 70–99)
Potassium: 4.2 mEq/L (ref 3.5–5.1)
SODIUM: 137 meq/L (ref 135–145)
Total Bilirubin: 0.5 mg/dL (ref 0.2–1.2)
Total Protein: 7.4 g/dL (ref 6.0–8.3)

## 2017-11-25 LAB — CBC WITH DIFFERENTIAL/PLATELET
BASOS PCT: 0.8 % (ref 0.0–3.0)
Basophils Absolute: 0.1 10*3/uL (ref 0.0–0.1)
EOS ABS: 0.2 10*3/uL (ref 0.0–0.7)
Eosinophils Relative: 3.2 % (ref 0.0–5.0)
HCT: 42.4 % (ref 36.0–46.0)
Hemoglobin: 14.6 g/dL (ref 12.0–15.0)
Lymphocytes Relative: 16.9 % (ref 12.0–46.0)
Lymphs Abs: 1.3 10*3/uL (ref 0.7–4.0)
MCHC: 34.4 g/dL (ref 30.0–36.0)
MCV: 91.6 fl (ref 78.0–100.0)
MONO ABS: 0.6 10*3/uL (ref 0.1–1.0)
Monocytes Relative: 7.7 % (ref 3.0–12.0)
NEUTROS ABS: 5.3 10*3/uL (ref 1.4–7.7)
Neutrophils Relative %: 71.4 % (ref 43.0–77.0)
PLATELETS: 484 10*3/uL — AB (ref 150.0–400.0)
RBC: 4.63 Mil/uL (ref 3.87–5.11)
RDW: 13.6 % (ref 11.5–15.5)
WBC: 7.5 10*3/uL (ref 4.0–10.5)

## 2017-11-25 LAB — TSH: TSH: 1.3 u[IU]/mL (ref 0.35–4.50)

## 2017-11-25 LAB — LIPID PANEL
CHOL/HDL RATIO: 5
Cholesterol: 243 mg/dL — ABNORMAL HIGH (ref 0–200)
HDL: 46 mg/dL (ref >39.00)
LDL Cholesterol: 171 mg/dL — ABNORMAL HIGH (ref 0–99)
NONHDL: 196.71
Triglycerides: 128 mg/dL (ref 0.0–149.0)
VLDL: 25.6 mg/dL (ref 0.0–40.0)

## 2017-11-25 MED ORDER — ALBUTEROL SULFATE HFA 108 (90 BASE) MCG/ACT IN AERS: 2.0000 | INHALATION_SPRAY | Freq: Four times a day (QID) | RESPIRATORY_TRACT | 3 refills | Status: DC | PRN

## 2017-11-25 NOTE — Progress Notes (Signed)
PCP notes:   Health maintenance:  Foot exam - PCP please address at next appt Mammogram - PCP please address at next appt Eye exam - addressed  Abnormal screenings:   Hearing - failed  Hearing Screening   125Hz  250Hz  500Hz  1000Hz  2000Hz  3000Hz  4000Hz  6000Hz  8000Hz   Right ear:   40 0 40  40    Left ear:   0 0 0  0     Mini-Cog score: 19/20 MMSE - Mini Mental State Exam 11/25/2017 10/01/2016  Orientation to time 5 5  Orientation to Place 5 5  Registration 3 3  Attention/ Calculation 0 0  Recall 2 unable to recall 1 of 3 words 3  Language- name 2 objects 0 0  Language- repeat 1 1  Language- follow 3 step command 3 3  Language- read & follow direction 0 0  Write a sentence 0 0  Copy design 0 0  Total score 19 20    Fall risk - hx of multiple falls Fall Risk  11/25/2017 10/01/2016 12/30/2015 06/14/2015 03/03/2013  Falls in the past year? Yes Yes Yes Yes No  Comment 2 falls caused by knee pain  May 2017; pt fell out of bed; states it was due to Metformin - - -  Number falls in past yr: 2 or more 1 1 1  -  Injury with Fall? No Yes Yes - -  Risk for fall due to : Impaired balance/gait;Impaired mobility Impaired balance/gait;Impaired mobility;Impaired vision - - -   Patient concerns:   Pt reports increased frequency of night sweats. Pt has requested a refill for Albuterol inhaler. Note sent to PCP.  Nurse concerns:  None  Next PCP appt:   12/02/2017 @ 1130   I reviewed health advisor's note, was available for consultation, and agree with documentation and plan. [name] MD

## 2017-11-25 NOTE — Progress Notes (Signed)
Subjective:   Erin Beck is a 77 y.o. female who presents for Medicare Annual (Subsequent) preventive examination.  Review of Systems:  N/A Cardiac Risk Factors include: advanced age (>78mn, >>23women);diabetes mellitus;obesity (BMI >30kg/m2);dyslipidemia;hypertension     Objective:     Vitals: BP 122/80 (BP Location: Left Arm, Patient Position: Sitting, Cuff Size: Normal)   Pulse 80   Temp 97.8 F (36.6 C) (Oral)   Ht 5' 0.5" (1.537 m) Comment: no shoes  Wt 183 lb 4 oz (83.1 kg)   SpO2 93%   BMI 35.20 kg/m   Body mass index is 35.2 kg/m.  Advanced Directives 11/25/2017 03/08/2017 10/01/2016 12/29/2015 09/15/2014 09/13/2014 04/23/2013  Does Patient Have a Medical Advance Directive? Yes No Yes No Yes Yes Patient has advance directive, copy not in chart  Type of Advance Directive [place] - - Living will;Healthcare Power of Attorney -  Does patient want to make changes to medical advance directive? - - - - - No - Patient declined -  Copy of [place]? No - copy requested - No - copy requested - No - copy requested - Copy requested from other (Comment)  Would patient like information on creating a medical advance directive? - - - No - patient declined information - - -    Tobacco Social History   Tobacco Use  Smoking Status Former Smoker  . Packs/day: 1.00  . Years: 48.00  . Pack years: 48.00  . Last attempt to quit: 08/20/2009  . Years since quitting: 8.2  Smokeless Tobacco Never Used     Counseling given: No   Clinical Intake:  Pre-visit preparation completed: Yes  Pain : No/denies pain Pain Score: 0-No pain     Nutritional Status: BMI > 30  Obese Nutritional Risks: None Diabetes: Yes CBG done?: No Did pt. bring in CBG monitor from home?: No  How often do you need to have someone help you when you read instructions, pamphlets, or other written materials  from your doctor or pharmacy?: 1 - Never What is the last grade level you completed in school?: 12th grade  Interpreter Needed?: No  Comments: pt lives with spouse Information entered by :: LPinson, LPN  Past Medical History:  Diagnosis Date  . Allergic rhinitis   . Anxiety   . Arthritis    OA  . Asthma   . CAD (coronary artery disease), native coronary artery 05/23/2016   Non obstructive with 40% mid LAD and 60% ramus  . Complication of anesthesia    gas bubble rt eye from an old retinal detachment  . Diabetes mellitus    type II  . Diverticulitis    Hx of  . Full dentures   . GERD (gastroesophageal reflux disease)   . Hematuria    HX OF MICROSCOPIC BLOOD IN URINE AND HX OF CYST ON ONE KIDNEY--BUT NO KIDNEY DISEASE  . Hyperlipidemia   . Hypertension   . IBS (irritable bowel syndrome)   . Osteopenia   . Wears glasses    Past Surgical History:  Procedure Laterality Date  . ABDOMINAL HYSTERECTOMY    . APPENDECTOMY     per pt  . CARDIAC CATHETERIZATION N/A 12/29/2015   Procedure: Left Heart Cath and Coronary Angiography;  Surgeon: HBelva Crome MD;  [facility];  Service: Cardiovascular;  Laterality: N/A;  . CARPAL TUNNEL RELEASE     both hands  .  CHOLECYSTECTOMY    . COLONOSCOPY    . EYE SURGERY     BILATERAL CATARACTS REMOVED  . EYE SURGERY     gas bubble in rt eye from a retinal detachment  . JOINT REPLACEMENT  2000   LEFT TOTAL KNEE ARTHROPLASTY  . JOINT REPLACEMENT  2013   rt total knee  . KNEE ARTHROSCOPY  1990   chondromalacia   . TOTAL KNEE ARTHROPLASTY  12/05/2011   Procedure: TOTAL KNEE ARTHROPLASTY;  Surgeon: Magnus Sinning, MD;  Location: [facility];  Service: Orthopedics;  Laterality: Right;  . ULNAR NERVE TRANSPOSITION Right 09/15/2014   Procedure: Right cubital tunnel release with transposition of ulnar nerve;  Surgeon: Marianna Payment, MD;  Location: [facility];  Service: Orthopedics;  Laterality: Right;   Family  History  Problem Relation Age of Onset  . Pneumonia Mother   . Diabetes Father   . Cancer Father        of the gallbladder  . Heart disease Maternal Grandmother   . Heart disease Paternal Grandmother   . Diabetes Brother    Social History   Socioeconomic History  . Marital status: Married    Spouse name: Not on file  . Number of children: Not on file  . Years of education: Not on file  . Highest education level: Not on file  Occupational History  . Not on file  Social Needs  . Financial resource strain: Not on file  . Food insecurity:    Worry: Not on file    Inability: Not on file  . Transportation needs:    Medical: Not on file    Non-medical: Not on file  Tobacco Use  . Smoking status: Former Smoker    Packs/day: 1.00    Years: 48.00    Pack years: 48.00    Last attempt to quit: 08/20/2009    Years since quitting: 8.2  . Smokeless tobacco: Never Used  Substance and Sexual Activity  . Alcohol use: No    Alcohol/week: 0.0 oz  . Drug use: No  . Sexual activity: Never  Lifestyle  . Physical activity:    Days per week: Not on file    Minutes per session: Not on file  . Stress: Not on file  Relationships  . Social connections:    Talks on phone: Not on file    Gets together: Not on file    Attends religious service: Not on file    Active member of club or organization: Not on file    Attends meetings of clubs or organizations: Not on file    Relationship status: Not on file  Other Topics Concern  . Not on file  Social History Narrative  . Not on file    Outpatient Encounter Medications as of 11/25/2017  Medication Sig  . ACCU-CHEK FASTCLIX LANCETS MISC TEST FOUR TIMES DAILY AS DIRECTED  . acetaminophen (TYLENOL) 325 MG tablet Take 650 mg by mouth every 6 (six) hours as needed (pain).  Marland Kitchen albuterol (VENTOLIN HFA) 108 (90 Base) MCG/ACT inhaler Inhale 2 puffs into the lungs every 6 (six) hours as needed for wheezing or shortness of breath. Reported on 12/30/2015    . aspirin 81 MG tablet Take 81 mg by mouth daily.  . Blood Glucose Monitoring Suppl (ONETOUCH VERIO) w/Device KIT 1 kit by Does not apply route as directed.  . cetirizine (ZYRTEC) 10 MG tablet Take 10 mg by mouth daily as needed for allergies.  . ciprofloxacin (  CIPRO) 500 MG tablet Take 1 tablet (500 mg total) by mouth 2 (two) times daily.  Marland Kitchen glipiZIDE (GLIPIZIDE XL) 5 MG 24 hr tablet Take 1 tablet (5 mg total) by mouth daily with breakfast.  . glucose blood test strip Use as instructed  . hydrochlorothiazide (HYDRODIURIL) 25 MG tablet TAKE 1 TABLET EVERY DAY  . Lancets (ONETOUCH ULTRASOFT) lancets Use as instructed  . lisinopril (PRINIVIL,ZESTRIL) 10 MG tablet Take 0.5 tablets (5 mg total) by mouth daily.  . metroNIDAZOLE (FLAGYL) 500 MG tablet Take 1 tablet (500 mg total) by mouth 3 (three) times daily.  . mupirocin ointment (BACTROBAN) 2 % Apply 1 application topically 2 (two) times daily. To affected area  . nitroGLYCERIN (NITROSTAT) 0.4 MG SL tablet Place 1 tablet (0.4 mg total) under the tongue every 5 (five) minutes as needed for chest pain.  Marland Kitchen omeprazole (PRILOSEC) 20 MG capsule Take 1 capsule (20 mg total) by mouth daily.  Marland Kitchen PARoxetine (PAXIL) 10 MG tablet Take 0.5 tablets (5 mg total) by mouth daily.  Marland Kitchen terconazole (TERAZOL 3) 0.8 % vaginal cream Apply to affected vaginal areas once daily  . ezetimibe (ZETIA) 10 MG tablet Take 1 tablet (10 mg total) by mouth daily.   No facility-administered encounter medications on file as of 11/25/2017.     Activities of Daily Living In your present state of health, do you have any difficulty performing the following activities: 11/25/2017  Hearing? Y  Vision? Y  Difficulty concentrating or making decisions? N  Walking or climbing stairs? Y  Dressing or bathing? N  Doing errands, shopping? Y  Preparing Food and eating ? N  Using the Toilet? N  In the past six months, have you accidently leaked urine? N  Do you have problems with loss of bowel  control? N  Managing your Medications? N  Managing your Finances? N  Housekeeping or managing your Housekeeping? N  Some recent data might be hidden    Patient Care Team: Tower, [name], MD as PCP - General    Assessment:   This is a routine wellness examination for [place].   Hearing Screening   '125Hz'$  '250Hz'$  '500Hz'$  '1000Hz'$  '2000Hz'$  '3000Hz'$  '4000Hz'$  '6000Hz'$  '8000Hz'$   Right ear:   40 0 40  40    Left ear:   0 0 0  0    Vision Screening Comments: Vision exams every 2 yrs   Exercise Activities and Dietary recommendations Current Exercise Habits: The patient does not participate in regular exercise at present, Exercise limited by: orthopedic condition(s)  Goals    . Follow up with Primary Care Provider     Starting 11/25/2017, I will continue to take medications as prescribed and to keep appointments with PCP as scheduled.        Fall Risk Fall Risk  11/25/2017 10/01/2016 12/30/2015 06/14/2015 03/03/2013  Falls in the past year? Yes Yes Yes Yes No  Comment 2 falls caused by knee pain  May 2017; pt fell out of bed; states it was due to Metformin - - -  Number falls in past yr: 2 or more '1 1 1 '$ -  Injury with Fall? No Yes Yes - -  Risk for fall due to : Impaired balance/gait;Impaired mobility Impaired balance/gait;Impaired mobility;Impaired vision - - -   Depression Screen PHQ 2/9 Scores 11/25/2017 10/01/2016 12/30/2015 06/14/2015  PHQ - 2 Score 0 0 0 0  PHQ- 9 Score 0 - - -     Cognitive Function MMSE - Mini Mental State  Exam 11/25/2017 10/01/2016  Orientation to time 5 5  Orientation to Place 5 5  Registration 3 3  Attention/ Calculation 0 0  Recall 3 3  Language- name 2 objects 0 0  Language- repeat 1 1  Language- follow 3 step command 3 3  Language- read & follow direction 0 0  Write a sentence 0 0  Copy design 0 0  Total score 20 20     PLEASE NOTE: A Mini-Cog screen was completed. Maximum score is 20. A value of 0 denotes this part of Folstein MMSE was not completed or the patient  failed this part of the Mini-Cog screening.   Mini-Cog Screening Orientation to Time - Max 5 pts Orientation to Place - Max 5 pts Registration - Max 3 pts Recall - Max 3 pts Language Repeat - Max 1 pts Language Follow 3 Step Command - Max 3 pts     Immunization History  Administered Date(s) Administered  . Influenza Split 05/02/2011  . Influenza Whole 08/20/2004, 09/18/2007, 06/21/2008, 06/22/2009, 06/19/2010  . Influenza,inj,Quad PF,6+ Mos 07/10/2013, 06/03/2015, 05/09/2016  . Pneumococcal Conjugate-13 06/14/2015  . Pneumococcal Polysaccharide-23 06/15/2003, 04/20/2008  . Td 07/06/2003  . Zoster 06/12/2014   Screening Tests Health Maintenance  Topic Date Due  . FOOT EXAM  12/02/2017 (Originally 06/13/2016)  . MAMMOGRAM  08/19/2018 (Originally 05/11/2017)  . OPHTHALMOLOGY EXAM  08/19/2018 (Originally 05/14/2017)  . TETANUS/TDAP  07/05/2023 (Originally 07/05/2013)  . HEMOGLOBIN A1C  03/06/2018  . INFLUENZA VACCINE  03/20/2018  . DEXA SCAN  Completed  . PNA vac Low Risk Adult  Completed       Plan:     I have personally reviewed, addressed, and noted the following in the patient's chart:  A. Medical and social history B. Use of alcohol, tobacco or illicit drugs  C. Current medications and supplements D. Functional ability and status E.  Nutritional status F.  Physical activity G. Advance directives H. List of other physicians I.  Hospitalizations, surgeries, and ER visits in previous 12 months J.  [place] to include hearing, vision, cognitive, depression L. Referrals and appointments - none  In addition, I have reviewed and discussed with patient certain preventive protocols, quality metrics, and best practice recommendations. A written personalized care plan for preventive services as well as general preventive health recommendations were provided to patient.  See attached scanned questionnaire for additional information.   Signed,   Lindell Noe,  MHA, BS, LPN Health Coach

## 2017-11-25 NOTE — Telephone Encounter (Signed)
Patient was in office today for AWV. Pt has requested a refill for Albuterol inhaler. Please send refill [facility] Order.

## 2017-11-25 NOTE — Telephone Encounter (Signed)
-----   Message from Eustace Pen, LPN sent at 10/24/675  9:23 PM EDT ----- Regarding: Labs 4/7 Lab orders needed. Thank you.  Insurance:  [employer]

## 2017-11-25 NOTE — Patient Instructions (Signed)
Erin Beck , Thank you for taking time to come for your Medicare Wellness Visit. I appreciate your ongoing commitment to your health goals. Please review the following plan we discussed and let me know if I can assist you in the future.   These are the goals we discussed: Goals    . Follow up with Primary Care Provider     Starting 11/25/2017, I will continue to take medications as prescribed and to keep appointments with PCP as scheduled.        This is a list of the screening recommended for you and due dates:  Health Maintenance  Topic Date Due  . Complete foot exam   12/02/2017*  . Mammogram  08/19/2018*  . Eye exam for diabetics  08/19/2018*  . Tetanus Vaccine  07/05/2023*  . Hemoglobin A1C  03/06/2018  . Flu Shot  03/20/2018  . DEXA scan (bone density measurement)  Completed  . Pneumonia vaccines  Completed  *Topic was postponed. The date shown is not the original due date.   Preventive Care for Adults  A healthy lifestyle and preventive care can promote health and wellness. Preventive health guidelines for adults include the following key practices.  . A routine yearly physical is a good way to check with your health care provider about your health and preventive screening. It is a chance to share any concerns and updates on your health and to receive a thorough exam.  . Visit your dentist for a routine exam and preventive care every 6 months. Brush your teeth twice a day and floss once a day. Good oral hygiene prevents tooth decay and gum disease.  . The frequency of eye exams is based on your age, health, family medical history, use  of contact lenses, and other factors. Follow your health care provider's recommendations for frequency of eye exams.  . Eat a healthy diet. Foods like vegetables, fruits, whole grains, low-fat dairy products, and lean protein foods contain the nutrients you need without too many calories. Decrease your intake of foods high in solid fats, added  sugars, and salt. Eat the right amount of calories for you. Get information about a proper diet from your health care provider, if necessary.  . Regular physical exercise is one of the most important things you can do for your health. Most adults should get at least 150 minutes of moderate-intensity exercise (any activity that increases your heart rate and causes you to sweat) each week. In addition, most adults need muscle-strengthening exercises on 2 or more days a week.  Silver Sneakers may be a benefit available to you. To determine eligibility, you may visit the website: www.silversneakers.com or contact program at [phone] Mon-Fri between 8AM-8PM.   . Maintain a healthy weight. The body mass index (BMI) is a screening tool to identify possible weight problems. It provides an estimate of body fat based on height and weight. Your health care provider can find your BMI and can help you achieve or maintain a healthy weight.   For adults 20 years and older: ? A BMI below 18.5 is considered underweight. ? A BMI of 18.5 to 24.9 is normal. ? A BMI of 25 to 29.9 is considered overweight. ? A BMI of 30 and above is considered obese.   . Maintain normal blood lipids and cholesterol levels by exercising and minimizing your intake of saturated fat. Eat a balanced diet with plenty of fruit and vegetables. Blood tests for lipids and cholesterol should begin at age  20 and be repeated every 5 years. If your lipid or cholesterol levels are high, you are over 50, or you are at high risk for heart disease, you may need your cholesterol levels checked more frequently. Ongoing high lipid and cholesterol levels should be treated with medicines if diet and exercise are not working.  . If you smoke, find out from your health care provider how to quit. If you do not use tobacco, please do not start.  . If you choose to drink alcohol, please do not consume more than 2 drinks per day. One drink is considered to  be 12 ounces (355 mL) of beer, 5 ounces (148 mL) of wine, or 1.5 ounces (44 mL) of liquor.  . If you are 53-65 years old, ask your health care provider if you should take aspirin to prevent strokes.  . Use sunscreen. Apply sunscreen liberally and repeatedly throughout the day. You should seek shade when your shadow is shorter than you. Protect yourself by wearing long sleeves, pants, a wide-brimmed hat, and sunglasses year round, whenever you are outdoors.  . Once a month, do a whole body skin exam, using a mirror to look at the skin on your back. Tell your health care provider of new moles, moles that have irregular borders, moles that are larger than a pencil eraser, or moles that have changed in shape or color.

## 2017-11-28 DIAGNOSIS — H43812 Vitreous degeneration, left eye: Secondary | ICD-10-CM | POA: Diagnosis not present

## 2017-11-28 DIAGNOSIS — Z9889 Other specified postprocedural states: Secondary | ICD-10-CM | POA: Diagnosis not present

## 2017-11-28 DIAGNOSIS — H35341 Macular cyst, hole, or pseudohole, right eye: Secondary | ICD-10-CM | POA: Diagnosis not present

## 2017-11-28 DIAGNOSIS — Z961 Presence of intraocular lens: Secondary | ICD-10-CM | POA: Diagnosis not present

## 2017-11-28 DIAGNOSIS — H02401 Unspecified ptosis of right eyelid: Secondary | ICD-10-CM | POA: Diagnosis not present

## 2017-11-28 DIAGNOSIS — G245 Blepharospasm: Secondary | ICD-10-CM | POA: Diagnosis not present

## 2017-12-02 ENCOUNTER — Ambulatory Visit (INDEPENDENT_AMBULATORY_CARE_PROVIDER_SITE_OTHER): Payer: Medicare HMO | Admitting: Family Medicine

## 2017-12-02 ENCOUNTER — Encounter: Payer: Self-pay | Admitting: Family Medicine

## 2017-12-02 VITALS — BP 124/66 | HR 79 | Temp 98.2°F | Ht 60.5 in | Wt 184.5 lb

## 2017-12-02 DIAGNOSIS — E1159 Type 2 diabetes mellitus with other circulatory complications: Secondary | ICD-10-CM | POA: Diagnosis not present

## 2017-12-02 DIAGNOSIS — H9113 Presbycusis, bilateral: Secondary | ICD-10-CM

## 2017-12-02 DIAGNOSIS — Z6835 Body mass index (BMI) 35.0-35.9, adult: Secondary | ICD-10-CM | POA: Diagnosis not present

## 2017-12-02 DIAGNOSIS — E785 Hyperlipidemia, unspecified: Secondary | ICD-10-CM

## 2017-12-02 DIAGNOSIS — D75839 Thrombocytosis, unspecified: Secondary | ICD-10-CM | POA: Insufficient documentation

## 2017-12-02 DIAGNOSIS — D473 Essential (hemorrhagic) thrombocythemia: Secondary | ICD-10-CM | POA: Diagnosis not present

## 2017-12-02 DIAGNOSIS — Z0000 Encounter for general adult medical examination without abnormal findings: Secondary | ICD-10-CM | POA: Diagnosis not present

## 2017-12-02 DIAGNOSIS — R202 Paresthesia of skin: Secondary | ICD-10-CM | POA: Insufficient documentation

## 2017-12-02 DIAGNOSIS — R209 Unspecified disturbances of skin sensation: Secondary | ICD-10-CM

## 2017-12-02 DIAGNOSIS — F419 Anxiety disorder, unspecified: Secondary | ICD-10-CM

## 2017-12-02 DIAGNOSIS — I10 Essential (primary) hypertension: Secondary | ICD-10-CM | POA: Diagnosis not present

## 2017-12-02 MED ORDER — LISINOPRIL 10 MG PO TABS: 5.0000 mg | ORAL_TABLET | Freq: Every day | ORAL | 3 refills | Status: DC

## 2017-12-02 MED ORDER — HYDROCHLOROTHIAZIDE 25 MG PO TABS: 25.0000 mg | ORAL_TABLET | Freq: Every day | ORAL | 3 refills | Status: DC

## 2017-12-02 NOTE — Assessment & Plan Note (Signed)
Discussed how this problem influences overall health and the risks it imposes  Reviewed plan for weight loss with lower calorie diet (via better food choices and also portion control or program like weight watchers) and exercise building up to or more than 30 minutes 5 days per week including some aerobic activity    

## 2017-12-02 NOTE — Assessment & Plan Note (Signed)
Reviewed health habits including diet and exercise and skin cancer prevention Reviewed appropriate screening tests for age  Also reviewed health mt list, fam hx and immunization status , as well as social and family history   See HPI Rev amw  Rev labs Disc need to use walker full time to prevent falls Also enc ca and D for bone health

## 2017-12-02 NOTE — Assessment & Plan Note (Signed)
Enc pt to take paxil daily instead of prn Reviewed stressors/ coping techniques/symptoms/ support sources/ tx options and side effects in detail today

## 2017-12-02 NOTE — Patient Instructions (Addendum)
Call back regarding facial symptoms tomorrow   Take your paroxetine every day (not as needed)   Use your walker full time (it prevents falls much more than a cane)   For bone health Try to get 1200-1500 mg of calcium per day with at least 1000 iu of vitamin D - for bone health  If calcium constipates you- just take the D

## 2017-12-02 NOTE — Assessment & Plan Note (Signed)
Pt declines treatment

## 2017-12-02 NOTE — Progress Notes (Signed)
Subjective:    Patient ID: Elon Jester, female    DOB: 21-Jun-1941, 77 y.o.   MRN: [id number]  HPI Here for health maintenance exam and to review chronic medical problems    Her R eye twitches all the time  Her r side of mouth draws - last few weeks (but first started in January) Has always had trouble keeping R eye open (no problems closing it)  Does not think it is from a stroke  Comes and goes - worse and better (thinks it is worse at night and getting up in the am)  R side of face feels decreased sensation at times  No problems swallowing    Caring for her husband with bladder cancer  Very stressful  Good and bad days    Wt Readings from Last 3 Encounters:  12/02/17 184 lb 8 oz (83.7 kg)  11/25/17 183 lb 4 oz (83.1 kg)  10/16/17 179 lb 3.2 oz (81.3 kg)  chronic pain and arthritis limit exercise   35.44 kg/m   Had amw 4/19 Failed hearing screen (not much of a problem for her - she does not feel like she needs hearing aides at this point)  Mini cog missed one word recall of the 3 Had 2 falls caused by knee pain with no injury  Now she uses a walker full time (occ cane) -- her knee will never be any better  Mammogram 9/17- declines further mammograms  Self breast exam -no lumps   Zoster vaccine 10/15  Tetanus shot 11/04-will look into pharmacy or health dept   Colonoscopy 4/08 Declines any further colon cancer screening of any kind   dexa 11/16 BMD in nl range  No fractures  Not taking ca or D   DM2 Lab Results  Component Value Date   HGBA1C 6.4 09/06/2017  less low blood sugars (works to eat regularly) - carries something with her  Was 6.2 a year ago  Eye exam - will see Dr Kathlen Mody for laser surgery soon (also examining her eyelid which wants to close on the R) and eye twitch  Did not mention DM  Ace for renal protection   bp is stable today  No cp or palpitations or headaches or edema  No side effects to medicines  BP Readings from Last 3  Encounters:  12/02/17 124/66  11/25/17 122/80  10/16/17 140/80     Hyperlipidemia Lab Results  Component Value Date   CHOL 243 (H) 11/25/2017   CHOL 232 (H) 10/01/2016   CHOL 225 (H) 05/16/2016   Lab Results  Component Value Date   HDL 46.00 11/25/2017   HDL 44.60 10/01/2016   HDL 38 (L) 05/16/2016   Lab Results  Component Value Date   LDLCALC 171 (H) 11/25/2017   LDLCALC 154 (H) 10/01/2016   LDLCALC 141 (H) 05/16/2016   Lab Results  Component Value Date   TRIG 128.0 11/25/2017   TRIG 170.0 (H) 10/01/2016   TRIG 228 (H) 05/16/2016   Lab Results  Component Value Date   CHOLHDL 5 11/25/2017   CHOLHDL 5 10/01/2016   CHOLHDL 5.9 (H) 05/16/2016   Lab Results  Component Value Date   LDLDIRECT 154.0 06/10/2015   LDLDIRECT 153.7 06/08/2014   LDLDIRECT 161.8 10/05/2013   [place]ol of all statins Watches diet - she is eating well/avoiding red meat and fried foods and fats  Cannot exercise  Takes zetia -no longer taking   paxil for mood - 5 mg /was only taking  prn  Will start taking it daily   Platelets are high  Lab Results  Component Value Date   WBC 7.5 11/25/2017   HGB 14.6 11/25/2017   HCT 42.4 11/25/2017   MCV 91.6 11/25/2017   PLT 484.0 (H) 11/25/2017   Continue to watch   Patient Active Problem List   Diagnosis Date Noted  . Thrombocytosis ([place]) 12/02/2017  . Facial paresthesia 12/02/2017  . Palpitations 04/02/2017  . Non-cardiac chest pain 05/23/2016  . CAD (coronary artery disease), native coronary artery 05/23/2016  . Chest pain 12/29/2015  . Abnormal stress test 12/29/2015  . Type 2 diabetes mellitus with circulatory disorder, without long-term current use of insulin ([place]) 06/17/2015  . Routine general medical examination at a health care facility 06/14/2015  . Hearing loss of aging 06/14/2015  . 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 Chronic neck and back pain 06/15/2014  . Right shoulder pain 03/12/2014  . Vitreomacular adhesion 05/05/2013  .  Unspecified asthma(493.90) 04/23/2013  . Personal history of colonic polyps 03/03/2013  . Post-menopausal 10/17/2012  . Obesity 10/31/2011  . IBS (irritable bowel syndrome) 08/10/2011  . GERD 07/31/2010  . Allergic rhinitis 12/05/2007  . HYPERTENSION, BENIGN ESSENTIAL 02/05/2007  . TOBACCO USE, QUIT 02/05/2007  . Hyperlipidemia 01/31/2007  . Anxiety 01/31/2007  . Osteoarthritis 01/31/2007  . DIVERTICULITIS, HX OF 01/31/2007   Past Medical History:  Diagnosis Date  . Allergic rhinitis   . Anxiety   . Arthritis    OA  . Asthma   . CAD (coronary artery disease), native coronary artery 05/23/2016   Non obstructive with 40% mid LAD and 60% ramus  . Complication of anesthesia    gas bubble rt eye from an old retinal detachment  . Diabetes mellitus    type II  . Diverticulitis    Hx of  . Full dentures   . GERD (gastroesophageal reflux disease)   . Hematuria    HX OF MICROSCOPIC BLOOD IN URINE AND HX OF CYST ON ONE KIDNEY--BUT NO KIDNEY DISEASE  . Hyperlipidemia   . Hypertension   . IBS (irritable bowel syndrome)   . Osteopenia   . Wears glasses    Past Surgical History:  Procedure Laterality Date  . ABDOMINAL HYSTERECTOMY    . APPENDECTOMY     per pt  . CARDIAC CATHETERIZATION N/A 12/29/2015   Procedure: Left Heart Cath and Coronary Angiography;  Surgeon: Belva Crome, MD;  Location: [facility];  Service: Cardiovascular;  Laterality: N/A;  . CARPAL TUNNEL RELEASE     both hands  . CHOLECYSTECTOMY    . COLONOSCOPY    . EYE SURGERY     BILATERAL CATARACTS REMOVED  . EYE SURGERY     gas bubble in rt eye from a retinal detachment  . JOINT REPLACEMENT  2000   LEFT TOTAL KNEE ARTHROPLASTY  . JOINT REPLACEMENT  2013   rt total knee  . KNEE ARTHROSCOPY  1990   chondromalacia   . TOTAL KNEE ARTHROPLASTY  12/05/2011   Procedure: TOTAL KNEE ARTHROPLASTY;  Surgeon: Magnus Sinning, MD;  Location: [facility];  Service: Orthopedics;  Laterality: Right;  . ULNAR NERVE  TRANSPOSITION Right 09/15/2014   Procedure: Right cubital tunnel release with transposition of ulnar nerve;  Surgeon: Marianna Payment, MD;  Location: [facility];  Service: Orthopedics;  Laterality: Right;   Social History   Tobacco Use  . Smoking status: Former Smoker    Packs/day: 1.00  Years: 48.00    Pack years: 48.00    Last attempt to quit: 08/20/2009    Years since quitting: 8.2  . Smokeless tobacco: Never Used  Substance Use Topics  . Alcohol use: No    Alcohol/week: 0.0 oz  . Drug use: No   Family History  Problem Relation Age of Onset  . Pneumonia Mother   . Diabetes Father   . Cancer Father        of the gallbladder  . Heart disease Maternal Grandmother   . Heart disease Paternal Grandmother   . Diabetes Brother    Allergies  Allergen Reactions  . Naproxen Rash    Trouble breathing  . Naproxen Sodium Rash    Trouble breathing  . Aspirin     REACTION: burns stomach PT STATES SHE DOES TOLERATE THE 81 MG ASPIRIN DAILY  . Atorvastatin     REACTION: muscle pain  . Buspar [Buspirone Hcl]     Multiple side eff  . Gabapentin Other (See Comments)    Palpitations/ dizziness   . Metformin And Related     Mm aches, palpitations, SOB, worse eyesight, loss of balance, sweating, low CBGs  . Metoprolol Other (See Comments)    dizziness  . Norco [Hydrocodone-Acetaminophen] Other (See Comments)    Dizzy and problems remembering   . Pravastatin     Leg pain  . Simvastatin     REACTION: GI side eff and swelling  . Sulfa Antibiotics Other (See Comments)    Severe joint pain  . Sulfonamide Derivatives     REACTION: reaction not known  . Zetia [Ezetimibe]     Caused dizziness   Current Outpatient Medications on File Prior to Visit  Medication Sig Dispense Refill  . ACCU-CHEK FASTCLIX LANCETS MISC TEST FOUR TIMES DAILY AS DIRECTED 200 each 3  . acetaminophen (TYLENOL) 325 MG tablet Take 650 mg by mouth every 6 (six) hours as needed (pain).    Marland Kitchen  albuterol (VENTOLIN HFA) 108 (90 Base) MCG/ACT inhaler Inhale 2 puffs into the lungs every 6 (six) hours as needed for wheezing or shortness of breath. Reported on 12/30/2015 3 Inhaler 3  . aspirin 81 MG tablet Take 81 mg by mouth daily.    . Blood Glucose Monitoring Suppl (ONETOUCH VERIO) w/Device KIT 1 kit by Does not apply route as directed. 1 kit 0  . cetirizine (ZYRTEC) 10 MG tablet Take 10 mg by mouth daily as needed for allergies.    Marland Kitchen glipiZIDE (GLIPIZIDE XL) 5 MG 24 hr tablet Take 1 tablet (5 mg total) by mouth daily with breakfast. 90 tablet 3  . glucose blood test strip Use as instructed 100 each 12  . Lancets (ONETOUCH ULTRASOFT) lancets Use as instructed 100 each 12  . mupirocin ointment (BACTROBAN) 2 % Apply 1 application topically 2 (two) times daily. To affected area 15 g 0  . nitroGLYCERIN (NITROSTAT) 0.4 MG SL tablet Place 1 tablet (0.4 mg total) under the tongue every 5 (five) minutes as needed for chest pain. 25 tablet 3  . omeprazole (PRILOSEC) 20 MG capsule Take 1 capsule (20 mg total) by mouth daily. 90 capsule 3  . PARoxetine (PAXIL) 10 MG tablet Take 0.5 tablets (5 mg total) by mouth daily. 15 tablet 11   No current facility-administered medications on file prior to visit.     Review of Systems  Constitutional: Positive for fatigue. Negative for activity change, appetite change, fever and unexpected weight change.  HENT: Negative for congestion,  ear pain, rhinorrhea, sinus pressure and sore throat.   Eyes: Negative for pain, redness and visual disturbance.  Respiratory: Negative for cough, shortness of breath and wheezing.   Cardiovascular: Negative for chest pain and palpitations.  Gastrointestinal: Negative for abdominal pain, blood in stool, constipation and diarrhea.  Endocrine: Negative for polydipsia and polyuria.  Genitourinary: Negative for dysuria, frequency and urgency.  Musculoskeletal: Negative for arthralgias, back pain and myalgias.  Skin: Negative for  pallor and rash.  Allergic/Immunologic: Negative for environmental allergies.  Neurological: Positive for facial asymmetry and numbness. Negative for dizziness, tremors, syncope, weakness and headaches.       Pt feels occ "drawing" on R side of face along with eye twitch    Hematological: Negative for adenopathy. Does not bruise/bleed easily.  Psychiatric/Behavioral: Negative for decreased concentration and dysphoric mood. The patient is not nervous/anxious.        Objective:   Physical Exam  Constitutional: She appears well-developed and well-nourished. No distress.  obese and well appearing   Nl speech  HENT:  Head: Normocephalic and atraumatic.  Right Ear: External ear normal.  Left Ear: External ear normal.  Mouth/Throat: Oropharynx is clear and moist.  slt dec nasolabial fold on R side  Nl CN motor function however R eye twitch is noticeable   Eyes: Pupils are equal, round, and reactive to light. Conjunctivae and EOM are normal. No scleral icterus.  R eyelid sits lower than the L (however this is baseline)  Neck: Normal range of motion. Neck supple. No JVD present. Carotid bruit is not present. No thyromegaly present.  Cardiovascular: Normal rate, regular rhythm, normal heart sounds and intact distal pulses. Exam reveals no gallop.  Pulmonary/Chest: Effort normal and breath sounds normal. No respiratory distress. She has no wheezes. She exhibits no tenderness. No breast swelling, tenderness, discharge or bleeding.  Abdominal: Soft. Bowel sounds are normal. She exhibits no distension, no abdominal bruit and no mass. There is no tenderness.  Genitourinary: No breast swelling, tenderness, discharge or bleeding.  Genitourinary Comments: Breast exam: No mass, nodules, thickening, tenderness, bulging, retraction, inflamation, nipple discharge or skin changes noted.  No axillary or clavicular LA.      Musculoskeletal: Normal range of motion. She exhibits no edema or tenderness.    Lymphadenopathy:    She has no cervical adenopathy.  Neurological: She is alert. She has normal reflexes. No cranial nerve deficit. She exhibits normal muscle tone. Coordination normal.  Pt c/o of dec sens to soft touch on R side of face  Dec nasolabial fold on R but nl movement of mouth   Baseline R eyelid droop   Skin: Skin is warm and dry. No rash noted. No erythema. No pallor.  Solar lentigines diffusely sks diffusely  Psychiatric: Her speech is normal and behavior is normal. Thought content normal. Her mood appears anxious.  Tearful when discussing husband's cancer          Assessment & Plan:   Problem List Items Addressed This Visit      Cardiovascular and Mediastinum   HYPERTENSION, BENIGN ESSENTIAL    bp in fair control at this time  BP Readings from Last 1 Encounters:  12/02/17 124/66   No changes needed Disc lifstyle change with low sodium diet and exercise  Labs reviewed       Relevant Medications   hydrochlorothiazide (HYDRODIURIL) 25 MG tablet   lisinopril (PRINIVIL,ZESTRIL) 10 MG tablet   Type 2 diabetes mellitus with circulatory disorder, without long-term current use of insulin (  [place]) (Chronic)    Lab Results  Component Value Date   HGBA1C 6.4 09/06/2017   Sent for last eye exam Overall stable control  Disc low glycemic diet and need for wt loss On ace for renal protection       Relevant Medications   hydrochlorothiazide (HYDRODIURIL) 25 MG tablet   lisinopril (PRINIVIL,ZESTRIL) 10 MG tablet     Nervous and Auditory   Hearing loss of aging    Pt declines treatment         Hematopoietic and Hemostatic   Thrombocytosis ([facility])    Platelets vary- up to 484 today  No bruising or bleeding Continue to follow         Other   Anxiety    Enc pt to take paxil daily instead of prn Reviewed stressors/ coping techniques/symptoms/ support sources/ tx options and side effects in detail today       Hyperlipidemia    Disc goals for lipids and  reasons to control them Rev labs with pt Rev low sat fat diet in detail Does not tolerate statin or zetia unfortunately      Relevant Medications   hydrochlorothiazide (HYDRODIURIL) 25 MG tablet   lisinopril (PRINIVIL,ZESTRIL) 10 MG tablet   Obesity    Discussed how this problem influences overall health and the risks it imposes  Reviewed plan for weight loss with lower calorie diet (via better food choices and also portion control or program like weight watchers) and exercise building up to or more than 30 minutes 5 days per week including some aerobic activity         Routine general medical examination at a health care facility - Primary    Reviewed health habits including diet and exercise and skin cancer prevention Reviewed appropriate screening tests for age  Also reviewed health mt list, fam hx and immunization status , as well as social and family history   See HPI Rev amw  Rev labs Disc need to use walker full time to prevent falls Also enc ca and D for bone health

## 2017-12-02 NOTE — Assessment & Plan Note (Signed)
bp in fair control at this time  BP Readings from Last 1 Encounters:  12/02/17 124/66   No changes needed Disc lifstyle change with low sodium diet and exercise  Labs reviewed

## 2017-12-02 NOTE — Assessment & Plan Note (Signed)
Platelets vary- up to 484 today  No bruising or bleeding Continue to follow

## 2017-12-02 NOTE — Assessment & Plan Note (Signed)
Disc goals for lipids and reasons to control them Rev labs with pt Rev low sat fat diet in detail Does not tolerate statin or zetia unfortunately

## 2017-12-02 NOTE — Assessment & Plan Note (Signed)
Lab Results  Component Value Date   HGBA1C 6.4 09/06/2017   Sent for last eye exam Overall stable control  Disc low glycemic diet and need for wt loss On ace for renal protection

## 2017-12-09 DIAGNOSIS — H04221 Epiphora due to insufficient drainage, right lacrimal gland: Secondary | ICD-10-CM | POA: Diagnosis not present

## 2017-12-09 DIAGNOSIS — H02403 Unspecified ptosis of bilateral eyelids: Secondary | ICD-10-CM | POA: Diagnosis not present

## 2017-12-09 DIAGNOSIS — G245 Blepharospasm: Secondary | ICD-10-CM | POA: Diagnosis not present

## 2017-12-09 LAB — HM DIABETES EYE EXAM

## 2017-12-11 ENCOUNTER — Encounter: Payer: Self-pay | Admitting: Surgery

## 2017-12-13 ENCOUNTER — Ambulatory Visit (INDEPENDENT_AMBULATORY_CARE_PROVIDER_SITE_OTHER): Payer: Medicare HMO | Admitting: Family Medicine

## 2017-12-13 ENCOUNTER — Encounter: Payer: Self-pay | Admitting: Family Medicine

## 2017-12-13 VITALS — BP 135/70 | HR 80 | Temp 98.4°F | Ht 60.5 in | Wt 182.8 lb

## 2017-12-13 DIAGNOSIS — I10 Essential (primary) hypertension: Secondary | ICD-10-CM

## 2017-12-13 DIAGNOSIS — J0100 Acute maxillary sinusitis, unspecified: Secondary | ICD-10-CM

## 2017-12-13 DIAGNOSIS — J019 Acute sinusitis, unspecified: Secondary | ICD-10-CM | POA: Insufficient documentation

## 2017-12-13 DIAGNOSIS — J301 Allergic rhinitis due to pollen: Secondary | ICD-10-CM | POA: Diagnosis not present

## 2017-12-13 DIAGNOSIS — H6123 Impacted cerumen, bilateral: Secondary | ICD-10-CM

## 2017-12-13 DIAGNOSIS — F419 Anxiety disorder, unspecified: Secondary | ICD-10-CM

## 2017-12-13 DIAGNOSIS — H6120 Impacted cerumen, unspecified ear: Secondary | ICD-10-CM | POA: Insufficient documentation

## 2017-12-13 MED ORDER — AMOXICILLIN-POT CLAVULANATE 875-125 MG PO TABS: 1.0000 | ORAL_TABLET | Freq: Two times a day (BID) | ORAL | 0 refills | Status: DC

## 2017-12-13 MED ORDER — FLUTICASONE PROPIONATE 50 MCG/ACT NA SUSP: 2.0000 | Freq: Every day | NASAL | 6 refills | Status: DC

## 2017-12-13 NOTE — Assessment & Plan Note (Signed)
Pt is now taking paxil daily instead of prn  Stressors continue  Dealing with them well

## 2017-12-13 NOTE — Progress Notes (Signed)
Subjective:    Patient ID: Erin Beck, female    DOB: 11/14/1940, 77 y.o.   MRN: [id number]  HPI Here for symptoms of sinusitis and ear pain   Sinuses bothering her this week  Now R ear feels clogged and cannot hear well out of it  Worse above eyes-sinus pain  No fever but feels cold and sweaty  Not a lot of nasal d/c    She tends to wake up with cold sweats   Does not sweat a lot during the day   Pollen is really bothering her  More hoarse  She has not been taking her zyrtec  Once in a while uses saline nasal spray    Also facial paresthesia on the R Ptosis baseline  R eye twitch Sees opthy  Was ref to Dr Kathlen Mody about droop in R eye (but she does not want eyelid surgery yet)  Needs laser surgery in L eye right now   She is taking her paxil daily  Not a lot of change  Had 2 cousins die recently  Stress is still high    Also excessive sweating  Lab Results  Component Value Date   TSH 1.30 11/25/2017    Pulse Readings from Last 3 Encounters:  12/13/17 80  12/02/17 79  11/25/17 80     BP Readings from Last 3 Encounters:  12/13/17 (!) 144/82  12/02/17 124/66  11/25/17 122/80  better on 2nd check  BP: 135/70     Patient Active Problem List   Diagnosis Date Noted  . Acute sinusitis 12/13/2017  . Cerumen impaction 12/13/2017  . Thrombocytosis ([place]) 12/02/2017  . Facial paresthesia 12/02/2017  . Palpitations 04/02/2017  . Non-cardiac chest pain 05/23/2016  . CAD (coronary artery disease), native coronary artery 05/23/2016  . Chest pain 12/29/2015  . Abnormal stress test 12/29/2015  . Type 2 diabetes mellitus with circulatory disorder, without long-term current use of insulin ([place]) 06/17/2015  . Routine general medical examination at a health care facility 06/14/2015  . Hearing loss of aging 06/14/2015  . 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 Chronic neck and back pain 06/15/2014  . Right shoulder pain 03/12/2014  . Vitreomacular adhesion  05/05/2013  . Unspecified asthma(493.90) 04/23/2013  . Personal history of colonic polyps 03/03/2013  . Post-menopausal 10/17/2012  . Obesity 10/31/2011  . IBS (irritable bowel syndrome) 08/10/2011  . GERD 07/31/2010  . Allergic rhinitis 12/05/2007  . HYPERTENSION, BENIGN ESSENTIAL 02/05/2007  . TOBACCO USE, QUIT 02/05/2007  . Hyperlipidemia 01/31/2007  . Anxiety 01/31/2007  . Osteoarthritis 01/31/2007  . DIVERTICULITIS, HX OF 01/31/2007   Past Medical History:  Diagnosis Date  . Allergic rhinitis   . Anxiety   . Arthritis    OA  . Asthma   . CAD (coronary artery disease), native coronary artery 05/23/2016   Non obstructive with 40% mid LAD and 60% ramus  . Complication of anesthesia    gas bubble rt eye from an old retinal detachment  . Diabetes mellitus    type II  . Diverticulitis    Hx of  . Full dentures   . GERD (gastroesophageal reflux disease)   . Hematuria    HX OF MICROSCOPIC BLOOD IN URINE AND HX OF CYST ON ONE KIDNEY--BUT NO KIDNEY DISEASE  . Hyperlipidemia   . Hypertension   . IBS (irritable bowel syndrome)   . Osteopenia   . Wears glasses    Past Surgical History:  Procedure Laterality Date  .  ABDOMINAL HYSTERECTOMY    . APPENDECTOMY     per pt  . CARDIAC CATHETERIZATION N/A 12/29/2015   Procedure: Left Heart Cath and Coronary Angiography;  Surgeon: Belva Crome, MD;  Location: [facility];  Service: Cardiovascular;  Laterality: N/A;  . CARPAL TUNNEL RELEASE     both hands  . CHOLECYSTECTOMY    . COLONOSCOPY    . EYE SURGERY     BILATERAL CATARACTS REMOVED  . EYE SURGERY     gas bubble in rt eye from a retinal detachment  . JOINT REPLACEMENT  2000   LEFT TOTAL KNEE ARTHROPLASTY  . JOINT REPLACEMENT  2013   rt total knee  . KNEE ARTHROSCOPY  1990   chondromalacia   . TOTAL KNEE ARTHROPLASTY  12/05/2011   Procedure: TOTAL KNEE ARTHROPLASTY;  Surgeon: Magnus Sinning, MD;  Location: [facility];  Service: Orthopedics;  Laterality: Right;  .  ULNAR NERVE TRANSPOSITION Right 09/15/2014   Procedure: Right cubital tunnel release with transposition of ulnar nerve;  Surgeon: Marianna Payment, MD;  Location: [facility];  Service: Orthopedics;  Laterality: Right;   Social History   Tobacco Use  . Smoking status: Former Smoker    Packs/day: 1.00    Years: 48.00    Pack years: 48.00    Last attempt to quit: 08/20/2009    Years since quitting: 8.3  . Smokeless tobacco: Never Used  Substance Use Topics  . Alcohol use: No    Alcohol/week: 0.0 oz  . Drug use: No   Family History  Problem Relation Age of Onset  . Pneumonia Mother   . Diabetes Father   . Cancer Father        of the gallbladder  . Heart disease Maternal Grandmother   . Heart disease Paternal Grandmother   . Diabetes Brother    Allergies  Allergen Reactions  . Naproxen Rash    Trouble breathing  . Naproxen Sodium Rash    Trouble breathing  . Aspirin     REACTION: burns stomach PT STATES SHE DOES TOLERATE THE 81 MG ASPIRIN DAILY  . Atorvastatin     REACTION: muscle pain  . Buspar [Buspirone Hcl]     Multiple side eff  . Gabapentin Other (See Comments)    Palpitations/ dizziness   . Metformin And Related     Mm aches, palpitations, SOB, worse eyesight, loss of balance, sweating, low CBGs  . Metoprolol Other (See Comments)    dizziness  . Norco [Hydrocodone-Acetaminophen] Other (See Comments)    Dizzy and problems remembering   . Pravastatin     Leg pain  . Simvastatin     REACTION: GI side eff and swelling  . Sulfa Antibiotics Other (See Comments)    Severe joint pain  . Sulfonamide Derivatives     REACTION: reaction not known  . Zetia [Ezetimibe]     Caused dizziness   Current Outpatient Medications on File Prior to Visit  Medication Sig Dispense Refill  . ACCU-CHEK FASTCLIX LANCETS MISC TEST FOUR TIMES DAILY AS DIRECTED 200 each 3  . acetaminophen (TYLENOL) 325 MG tablet Take 650 mg by mouth every 6 (six) hours as needed (pain).     Marland Kitchen albuterol (VENTOLIN HFA) 108 (90 Base) MCG/ACT inhaler Inhale 2 puffs into the lungs every 6 (six) hours as needed for wheezing or shortness of breath. Reported on 12/30/2015 3 Inhaler 3  . aspirin 81 MG tablet Take 81 mg by mouth daily.    Marland Kitchen  Blood Glucose Monitoring Suppl (ONETOUCH VERIO) w/Device KIT 1 kit by Does not apply route as directed. 1 kit 0  . cetirizine (ZYRTEC) 10 MG tablet Take 10 mg by mouth daily as needed for allergies.    Marland Kitchen glipiZIDE (GLIPIZIDE XL) 5 MG 24 hr tablet Take 1 tablet (5 mg total) by mouth daily with breakfast. 90 tablet 3  . glucose blood test strip Use as instructed 100 each 12  . hydrochlorothiazide (HYDRODIURIL) 25 MG tablet Take 1 tablet (25 mg total) by mouth daily. 90 tablet 3  . Lancets (ONETOUCH ULTRASOFT) lancets Use as instructed 100 each 12  . lisinopril (PRINIVIL,ZESTRIL) 10 MG tablet Take 0.5 tablets (5 mg total) by mouth daily. 45 tablet 3  . mupirocin ointment (BACTROBAN) 2 % Apply 1 application topically 2 (two) times daily. To affected area 15 g 0  . nitroGLYCERIN (NITROSTAT) 0.4 MG SL tablet Place 1 tablet (0.4 mg total) under the tongue every 5 (five) minutes as needed for chest pain. 25 tablet 3  . omeprazole (PRILOSEC) 20 MG capsule Take 1 capsule (20 mg total) by mouth daily. 90 capsule 3  . PARoxetine (PAXIL) 10 MG tablet Take 0.5 tablets (5 mg total) by mouth daily. 15 tablet 11   No current facility-administered medications on file prior to visit.       Review of Systems  Constitutional: Positive for fatigue. Negative for activity change, appetite change, fever and unexpected weight change.  HENT: Positive for ear pain, hearing loss, postnasal drip, rhinorrhea, sinus pressure, sinus pain and voice change. Negative for congestion, ear discharge, sore throat and trouble swallowing.   Eyes: Negative for pain, redness and visual disturbance.  Respiratory: Negative for cough, shortness of breath and wheezing.   Cardiovascular: Negative  for chest pain and palpitations.  Gastrointestinal: Negative for abdominal pain, blood in stool, constipation and diarrhea.  Endocrine: Negative for polydipsia and polyuria.  Genitourinary: Negative for dysuria, frequency and urgency.  Musculoskeletal: Negative for arthralgias, back pain and myalgias.  Skin: Negative for pallor and rash.  Allergic/Immunologic: Negative for environmental allergies.  Neurological: Negative for dizziness, syncope and headaches.  Hematological: Negative for adenopathy. Does not bruise/bleed easily.  Psychiatric/Behavioral: Negative for decreased concentration and dysphoric mood. The patient is nervous/anxious.        Stressors continue        Objective:   Physical Exam  Constitutional: She appears well-developed and well-nourished. No distress.  obese and well appearing   HENT:  Head: Normocephalic and atraumatic.  Right Ear: External ear normal.  Left Ear: External ear normal.  Mouth/Throat: Oropharynx is clear and moist. No oropharyngeal exudate.  Nares are injected and congested  Bilateral maxillary sinus tenderness (some frontal as well) Post nasal drip   R ear canal - cerumen impaction-dry appearing L ear canal- scant cerumen  Eyes: Pupils are equal, round, and reactive to light. Conjunctivae and EOM are normal. Right eye exhibits no discharge. Left eye exhibits no discharge.  Neck: Normal range of motion. Neck supple. No JVD present. Carotid bruit is not present. No thyromegaly present.  Cardiovascular: Normal rate, regular rhythm, normal heart sounds and intact distal pulses. Exam reveals no gallop.  Pulmonary/Chest: Effort normal and breath sounds normal. No respiratory distress. She has no wheezes. She has no rales. She exhibits no tenderness.  No crackles  Good air exch  Abdominal: Soft. Bowel sounds are normal. She exhibits no abdominal bruit.  Musculoskeletal: She exhibits no edema.  Lymphadenopathy:    She has no  cervical adenopathy.    Neurological: She is alert. She has normal reflexes. No cranial nerve deficit. She exhibits normal muscle tone. Coordination normal.  Walks with cane  Skin: Skin is warm and dry. No rash noted. No pallor.  Psychiatric: She has a normal mood and affect.          Assessment & Plan:   Problem List Items Addressed This Visit      Cardiovascular and Mediastinum   HYPERTENSION, BENIGN ESSENTIAL    bp in fair control at this time  BP Readings from Last 1 Encounters:  12/13/17 135/70   No changes needed Disc lifstyle change with low sodium diet and exercise          Respiratory   Acute sinusitis - Primary    Cover with augmentin  flonase for cong/all rhinitis Disc symptomatic care - see instructions on AVS  Update if not starting to improve in a week or if worsening        Relevant Medications   amoxicillin-clavulanate (AUGMENTIN) 875-125 MG tablet   fluticasone (FLONASE) 50 MCG/ACT nasal spray   Allergic rhinitis    Strongly recommend flonase daily through allergy season         Nervous and Auditory   Cerumen impaction    Improved after simple irrigation (worse on the r)         Other   Anxiety    Pt is now taking paxil daily instead of prn  Stressors continue  Dealing with them well       Facial paresthesia

## 2017-12-13 NOTE — Patient Instructions (Addendum)
For sinus infection  Drink lots of fluids Take augmentin as directed  Try flonase nasal spray through allergy season  Get back on zyrtec in allergy season   Update if not starting to improve in a week or if worsening    Ears look improved after irrigation

## 2017-12-15 NOTE — Assessment & Plan Note (Signed)
Cover with augmentin  flonase for cong/all rhinitis Disc symptomatic care - see instructions on AVS  Update if not starting to improve in a week or if worsening

## 2017-12-15 NOTE — Assessment & Plan Note (Signed)
bp in fair control at this time  BP Readings from Last 1 Encounters:  12/13/17 135/70   No changes needed Disc lifstyle change with low sodium diet and exercise

## 2017-12-15 NOTE — Assessment & Plan Note (Signed)
Improved after simple irrigation (worse on the r)

## 2017-12-15 NOTE — Assessment & Plan Note (Signed)
Strongly recommend flonase daily through allergy season

## 2018-01-25 ENCOUNTER — Other Ambulatory Visit: Payer: Self-pay | Admitting: Family Medicine

## 2018-02-27 DIAGNOSIS — H35341 Macular cyst, hole, or pseudohole, right eye: Secondary | ICD-10-CM | POA: Diagnosis not present

## 2018-02-27 DIAGNOSIS — H353131 Nonexudative age-related macular degeneration, bilateral, early dry stage: Secondary | ICD-10-CM | POA: Diagnosis not present

## 2018-02-27 DIAGNOSIS — H43812 Vitreous degeneration, left eye: Secondary | ICD-10-CM | POA: Diagnosis not present

## 2018-02-27 DIAGNOSIS — H02401 Unspecified ptosis of right eyelid: Secondary | ICD-10-CM | POA: Diagnosis not present

## 2018-02-27 DIAGNOSIS — Z9889 Other specified postprocedural states: Secondary | ICD-10-CM | POA: Diagnosis not present

## 2018-03-04 ENCOUNTER — Telehealth: Payer: Self-pay | Admitting: Internal Medicine

## 2018-03-04 MED ORDER — GLUCOSE BLOOD VI STRP: ORAL_STRIP | 12 refills | Status: DC

## 2018-03-04 MED ORDER — GLIPIZIDE ER 5 MG PO TB24: 5.0000 mg | ORAL_TABLET | Freq: Every day | ORAL | 3 refills | Status: DC

## 2018-03-04 NOTE — Telephone Encounter (Signed)
Sent!

## 2018-03-04 NOTE — Telephone Encounter (Signed)
Pt needs refills [facility] she has called the pharmacy but they just keep telling her to call us and ask to send in a rx. # [phone]  She needs: Accucheck guide test strips  Glipizide

## 2018-03-04 NOTE — Addendum Note (Signed)
Addended by: Drucilla Schmidt on: 03/04/2018 11:28 AM   Modules accepted: Orders

## 2018-03-13 ENCOUNTER — Ambulatory Visit: Payer: Medicare HMO | Admitting: Internal Medicine

## 2018-03-13 ENCOUNTER — Encounter: Payer: Self-pay | Admitting: Internal Medicine

## 2018-03-13 VITALS — BP 150/88 | HR 83 | Ht 60.5 in | Wt 187.2 lb

## 2018-03-13 DIAGNOSIS — Z6835 Body mass index (BMI) 35.0-35.9, adult: Secondary | ICD-10-CM | POA: Diagnosis not present

## 2018-03-13 DIAGNOSIS — E1159 Type 2 diabetes mellitus with other circulatory complications: Secondary | ICD-10-CM | POA: Diagnosis not present

## 2018-03-13 DIAGNOSIS — E785 Hyperlipidemia, unspecified: Secondary | ICD-10-CM | POA: Diagnosis not present

## 2018-03-13 LAB — POCT GLYCOSYLATED HEMOGLOBIN (HGB A1C): Hemoglobin A1C: 7.6 % — AB (ref 4.0–5.6)

## 2018-03-13 MED ORDER — SITAGLIPTIN PHOSPHATE 100 MG PO TABS: 100.0000 mg | ORAL_TABLET | Freq: Every day | ORAL | 11 refills | Status: DC

## 2018-03-13 NOTE — Addendum Note (Signed)
Addended by: Drucilla Schmidt on: 03/13/2018 04:41 PM   Modules accepted: Orders

## 2018-03-13 NOTE — Patient Instructions (Addendum)
Please continue: - Glipizide XL 5 mg in am  Please add: - Januvia 100 mg in am  Please return in 4 months with your sugar log.

## 2018-03-13 NOTE — Progress Notes (Signed)
Patient ID: Erin Beck, female   DOB: 02-13-1941, 77 y.o.   MRN: [id number]  HPI: Erin Beck is a 77 y.o.-year-old female, returning for f/u for DM2, non-insulin-dependent, dx 2005, uncontrolled, with complications (CAD). Last visit 6 mo ago.  Last hemoglobin A1c was: Lab Results  Component Value Date   HGBA1C 6.4 09/06/2017   HGBA1C 6.7 02/25/2017   HGBA1C 6.3 11/02/2016  Labs from Dr. Erlinda Hong [address], 08/30/2014): HbA1c 6.9%, decreased from 8.0%  Pt is on a regimen of: - Glipizide XL 5 mg in am Stopped Metformin ER 1500 mg in pm before cath but she was not feeling well on it (diarrhea, weakness, thirst, leg swelling, blurry vision) She has not been on insulin before, would not like to stick herself.  Pt checks her sugars 2-3x a day: - am: 8108-138 >> 102-135 >> 154-175 - 2h after b'fast: 97, 154 >> n/c - before lunch: 82-138, 155 >> 65, 88-128 >> n/c - 2h after lunch: 76, 111-140 >> 93-154 >> n/c - before dinner:80-131 >> 89-124 >> 111, 132, 154 - 2h after dinner: 121-150 >> 110-140, 168 >> n/c >> 119 - bedtime:  132 >> 121, 137 - nighttime: 114-122 >> 121-127 Lowest sugar was 80 >> 65 x1 >> 111;  she has hypoglycemia awareness in the 80s. Highest sugar was 155 >> 137 >> 175.  Pt's meals are: - Breakfast: cereals - Lunch: salads, 1 sandwich, leftovers - Dinner: salmon/chicken, vegetables - Snacks: 1 fruit, crackers - not every day No soft drinks.  - No CKD, last BUN/creatinine:  Lab Results  Component Value Date   BUN 13 11/25/2017   CREATININE 0.82 11/25/2017  On Lisinopril. - + HL; last set of lipids: Lab Results  Component Value Date   CHOL 243 (H) 11/25/2017   HDL 46.00 11/25/2017   LDLCALC 171 (H) 11/25/2017   LDLDIRECT 154.0 06/10/2015   TRIG 128.0 11/25/2017   CHOLHDL 5 11/25/2017  Tried statins >> AP. Also, intolerant to Ezetimibe. - last eye exam was in 02/2018 >> No DR. H/o cataract. Dr. Zadie Rhine. - No numbness and tingling in her  feet.  Husband has aggressive bladder CA.  ROS: Constitutional: no weight gain/no weight loss, no fatigue, no subjective hyperthermia, no subjective hypothermia Eyes: no blurry vision, no xerophthalmia ENT: no sore throat, no nodules palpated in throat, no dysphagia, no odynophagia, no hoarseness Cardiovascular: no CP/no SOB/no palpitations/no leg swelling Respiratory: no cough/no SOB/no wheezing Gastrointestinal: no N/no V/no D/no C/no acid reflux Musculoskeletal: no muscle aches/no joint aches Skin: no rashes, no hair loss Neurological: no tremors/no numbness/no tingling/no dizziness  I reviewed pt's medications, allergies, PMH, social hx, family hx, and changes were documented in the history of present illness. Otherwise, unchanged from my initial visit note.  Past Medical History:  Diagnosis Date  . Allergic rhinitis   . Anxiety   . Arthritis    OA  . Asthma   . CAD (coronary artery disease), native coronary artery 05/23/2016   Non obstructive with 40% mid LAD and 60% ramus  . Complication of anesthesia    gas bubble rt eye from an old retinal detachment  . Diabetes mellitus    type II  . Diverticulitis    Hx of  . Full dentures   . GERD (gastroesophageal reflux disease)   . Hematuria    HX OF MICROSCOPIC BLOOD IN URINE AND HX OF CYST ON ONE KIDNEY--BUT NO KIDNEY DISEASE  . Hyperlipidemia   . Hypertension   .  IBS (irritable bowel syndrome)   . Osteopenia   . Wears glasses    Past Surgical History:  Procedure Laterality Date  . ABDOMINAL HYSTERECTOMY    . APPENDECTOMY     per pt  . CARDIAC CATHETERIZATION N/A 12/29/2015   Procedure: Left Heart Cath and Coronary Angiography;  Surgeon: Belva Crome, MD;  Location: [facility];  Service: Cardiovascular;  Laterality: N/A;  . CARPAL TUNNEL RELEASE     both hands  . CHOLECYSTECTOMY    . COLONOSCOPY    . EYE SURGERY     BILATERAL CATARACTS REMOVED  . EYE SURGERY     gas bubble in rt eye from a retinal  detachment  . JOINT REPLACEMENT  2000   LEFT TOTAL KNEE ARTHROPLASTY  . JOINT REPLACEMENT  2013   rt total knee  . KNEE ARTHROSCOPY  1990   chondromalacia   . TOTAL KNEE ARTHROPLASTY  12/05/2011   Procedure: TOTAL KNEE ARTHROPLASTY;  Surgeon: Magnus Sinning, MD;  Location: [facility];  Service: Orthopedics;  Laterality: Right;  . ULNAR NERVE TRANSPOSITION Right 09/15/2014   Procedure: Right cubital tunnel release with transposition of ulnar nerve;  Surgeon: Marianna Payment, MD;  Location: [facility];  Service: Orthopedics;  Laterality: Right;   Social History   Socioeconomic History  . Marital status: Married    Spouse name: Not on file  . Number of children: Not on file  . Years of education: Not on file  . Highest education level: Not on file  Occupational History  . Not on file  Social Needs  . Financial resource strain: Not on file  . Food insecurity:    Worry: Not on file    Inability: Not on file  . Transportation needs:    Medical: Not on file    Non-medical: Not on file  Tobacco Use  . Smoking status: Former Smoker    Packs/day: 1.00    Years: 48.00    Pack years: 48.00    Last attempt to quit: 08/20/2009    Years since quitting: 8.5  . Smokeless tobacco: Never Used  Substance and Sexual Activity  . Alcohol use: No    Alcohol/week: 0.0 oz  . Drug use: No  . Sexual activity: Never  Lifestyle  . Physical activity:    Days per week: Not on file    Minutes per session: Not on file  . Stress: Not on file  Relationships  . Social connections:    Talks on phone: Not on file    Gets together: Not on file    Attends religious service: Not on file    Active member of club or organization: Not on file    Attends meetings of clubs or organizations: Not on file    Relationship status: Not on file  . Intimate partner violence:    Fear of current or ex partner: Not on file    Emotionally abused: Not on file    Physically abused: Not on file    Forced  sexual activity: Not on file  Other Topics Concern  . Not on file  Social History Narrative  . Not on file   Current Outpatient Medications on File Prior to Visit  Medication Sig Dispense Refill  . ACCU-CHEK FASTCLIX LANCETS MISC TEST FOUR TIMES DAILY AS DIRECTED 200 each 3  . acetaminophen (TYLENOL) 325 MG tablet Take 650 mg by mouth every 6 (six) hours as needed (pain).    Marland Kitchen  albuterol (VENTOLIN HFA) 108 (90 Base) MCG/ACT inhaler Inhale 2 puffs into the lungs every 6 (six) hours as needed for wheezing or shortness of breath. Reported on 12/30/2015 3 Inhaler 3  . amoxicillin-clavulanate (AUGMENTIN) 875-125 MG tablet Take 1 tablet by mouth 2 (two) times daily. 14 tablet 0  . aspirin 81 MG tablet Take 81 mg by mouth daily.    . Blood Glucose Monitoring Suppl (ONETOUCH VERIO) w/Device KIT 1 kit by Does not apply route as directed. 1 kit 0  . cetirizine (ZYRTEC) 10 MG tablet Take 10 mg by mouth daily as needed for allergies.    . fluticasone (FLONASE) 50 MCG/ACT nasal spray Place 2 sprays into both nostrils daily. 16 g 6  . glipiZIDE (GLIPIZIDE XL) 5 MG 24 hr tablet Take 1 tablet (5 mg total) by mouth daily with breakfast. 90 tablet 3  . glucose blood (ACCU-CHEK GUIDE) test strip Use as instructed to test once daily, DX E11.59 100 each 12  . hydrochlorothiazide (HYDRODIURIL) 25 MG tablet Take 1 tablet (25 mg total) by mouth daily. 90 tablet 3  . Lancets (ONETOUCH ULTRASOFT) lancets Use as instructed 100 each 12  . lisinopril (PRINIVIL,ZESTRIL) 10 MG tablet Take 0.5 tablets (5 mg total) by mouth daily. 45 tablet 3  . mupirocin ointment (BACTROBAN) 2 % Apply 1 application topically 2 (two) times daily. To affected area 15 g 0  . nitroGLYCERIN (NITROSTAT) 0.4 MG SL tablet Place 1 tablet (0.4 mg total) under the tongue every 5 (five) minutes as needed for chest pain. 25 tablet 3  . omeprazole (PRILOSEC) 20 MG capsule TAKE 1 CAPSULE (20 MG TOTAL) BY MOUTH DAILY. 90 capsule 2  . PARoxetine (PAXIL) 10 MG  tablet Take 0.5 tablets (5 mg total) by mouth daily. 15 tablet 11   No current facility-administered medications on file prior to visit.    Allergies  Allergen Reactions  . Naproxen Rash    Trouble breathing  . Naproxen Sodium Rash    Trouble breathing  . Aspirin     REACTION: burns stomach PT STATES SHE DOES TOLERATE THE 81 MG ASPIRIN DAILY  . Atorvastatin     REACTION: muscle pain  . Buspar [Buspirone Hcl]     Multiple side eff  . Gabapentin Other (See Comments)    Palpitations/ dizziness   . Metformin And Related     Mm aches, palpitations, SOB, worse eyesight, loss of balance, sweating, low CBGs  . Metoprolol Other (See Comments)    dizziness  . Norco [Hydrocodone-Acetaminophen] Other (See Comments)    Dizzy and problems remembering   . Pravastatin     Leg pain  . Simvastatin     REACTION: GI side eff and swelling  . Sulfa Antibiotics Other (See Comments)    Severe joint pain  . Sulfonamide Derivatives     REACTION: reaction not known  . Zetia [Ezetimibe]     Caused dizziness   Family History  Problem Relation Age of Onset  . Pneumonia Mother   . Diabetes Father   . Cancer Father        of the gallbladder  . Heart disease Maternal Grandmother   . Heart disease Paternal Grandmother   . Diabetes Brother     PE: BP (!) 150/88   Pulse 83   Ht 5' 0.5" (1.537 m)   Wt 187 lb 3.2 oz (84.9 kg)   SpO2 95%   BMI 35.96 kg/m  Body mass index is 35.96 kg/m. Wt Readings from  Last 3 Encounters:  03/13/18 187 lb 3.2 oz (84.9 kg)  12/13/17 182 lb 12 oz (82.9 kg)  12/02/17 184 lb 8 oz (83.7 kg)   Constitutional: overweight, in NAD, walks With a walker Eyes: PERRLA, EOMI, no exophthalmos ENT: moist mucous membranes, no thyromegaly, no cervical lymphadenopathy Cardiovascular: RRR, No MRG Respiratory: CTA B Gastrointestinal: abdomen soft, NT, ND, BS+ Musculoskeletal: no deformities, strength intact in all 4 Skin: moist, warm, no rashes Neurological: no tremor with  outstretched hands, DTR normal in all 4  ASSESSMENT: 1. DM2, non-insulin-dependent, uncontrolled, with complications - CAD  2. HL  3.  Obesity  PLAN:  1. Patient with long-standing, well-controlled diabetes, on only glipizide XL.  At last visit, sugars were well controlled, without significant hyperglycemic spikes and also without lows.  However, at this visit, her sugars are higher.  She mostly checks in the morning and I advised her to start checking some sugars later in the day.  We discussed about improving her diet.  She mentions that her dinners are now late and she and her husband our eating out more.  She would like to avoid metformin and also injectables.  In that case, I suggested to add Januvia.  I hope this or another DPP 4 inhibitor is covered by her insurance. - I suggested to:  Patient Instructions  Please continue: - Glipizide XL 5 mg in am  Please add: - Januvia 100 mg in am  Please return in 4 months with your sugar log.   - today, HbA1c is 7.6% (higher) - continue checking sugars at different times of the day - check 1x a day, rotating checks - advised for yearly eye exams >> she is UTD - Return to clinic in 4 mo with sugar log   2. HL - Reviewed latest lipid panel from 11/2017: LDL even higher than before: Lab Results  Component Value Date   CHOL 243 (H) 11/25/2017   HDL 46.00 11/25/2017   LDLCALC 171 (H) 11/25/2017   LDLDIRECT 154.0 06/10/2015   TRIG 128.0 11/25/2017   CHOLHDL 5 11/25/2017  - she is intolerant to statins and ezetimibe - may be a candidate for PCSK9 inh, but she refuses injections - Discussed about improving her meals  3. Obesity - continues to work on her diet - gained 3-5 lbs since last visit  Philemon Kingdom, MD PhD [facility]

## 2018-03-18 ENCOUNTER — Encounter: Payer: Self-pay | Admitting: Family Medicine

## 2018-03-18 ENCOUNTER — Ambulatory Visit (INDEPENDENT_AMBULATORY_CARE_PROVIDER_SITE_OTHER): Payer: Medicare HMO | Admitting: Family Medicine

## 2018-03-18 VITALS — BP 156/84 | HR 87 | Temp 98.9°F | Ht 60.5 in | Wt 185.8 lb

## 2018-03-18 DIAGNOSIS — I10 Essential (primary) hypertension: Secondary | ICD-10-CM | POA: Diagnosis not present

## 2018-03-18 DIAGNOSIS — E1159 Type 2 diabetes mellitus with other circulatory complications: Secondary | ICD-10-CM

## 2018-03-18 MED ORDER — LISINOPRIL 10 MG PO TABS: 10.0000 mg | ORAL_TABLET | Freq: Every day | ORAL | 3 refills | Status: DC

## 2018-03-18 NOTE — Assessment & Plan Note (Signed)
Sees Dr Cruzita Lederer Lab Results  Component Value Date   HGBA1C 7.6 (A) 03/13/2018   Pt was px Januvia but cannot afford it  She will call and let her know she did not start it

## 2018-03-18 NOTE — Assessment & Plan Note (Signed)
Both blood pressure and blood sugar are on the rise  Wt is stable  BP Readings from Last 3 Encounters:  03/18/18 (!) 156/84  03/13/18 (!) 150/88  12/13/17 135/70   No change in diet- improved lately  Will inc lisinopril back to 10 mg daily /well tolerated in the past Enc healthy lifestyle habits Will also cut coffee to one cup per day  She will check bp at home-alert [place] if no imp in 1-2 weeks Goal less than 140/90 with no side eff Also will have checked with Dr Allayne Gitelman loss encouraged

## 2018-03-18 NOTE — Patient Instructions (Addendum)
Make sure to call Dr Cruzita Lederer and tell her you cannot afford the Januvia so you can make an alternate plan  Cut regular coffee to 1 cup per day  The rest- decaf please   Increase your lisinopril to 10 mg one daily  If blood pressure is not improved in 1-2 weeks - (under 140/90) please let me know   Try to get most of your carbohydrates from produce (with the exception of white potatoes)  Eat less bread/pasta/rice/snack foods/cereals/sweets and other items from the middle of the grocery store (processed carbs)  Most sodium is in the processed food

## 2018-03-18 NOTE — Progress Notes (Signed)
Subjective:    Patient ID: Erin Beck, female    DOB: 04-May-1941, 77 y.o.   MRN: [id number]  HPI  Here for BP visit  Wt Readings from Last 3 Encounters:  03/18/18 185 lb 12 oz (84.3 kg)  03/13/18 187 lb 3.2 oz (84.9 kg)  12/13/17 182 lb 12 oz (82.9 kg)   35.68 kg/m   bp has been up  BP Readings from Last 3 Encounters:  03/18/18 (!) 156/84  03/13/18 (!) 150/88  12/13/17 135/70   Last fall we dec her bp medicine  Takes hctz 25 mg Lisinopril 10 mg pill- 1/2 daily   Pulse Readings from Last 3 Encounters:  03/18/18 87  03/13/18 83  12/13/17 80   Lab Results  Component Value Date   CREATININE 0.82 11/25/2017   BUN 13 11/25/2017   NA 137 11/25/2017   K 4.2 11/25/2017   CL 101 11/25/2017   CO2 28 11/25/2017     Also glucose is going up  Lab Results  Component Value Date   HGBA1C 7.6 (A) 03/13/2018   Dr Erin Beck px Erin Beck -has not tried it  Cannot afford it  45$ for 1 month supply  Also read side effects and is afraid of it  Just got a new meter and getting back to her routine   Sticking to diabetic diet   Not eating more processed or salty food  Exercising more (not less)- walking with walker  No nsaids Coffee- 3 cups per day   Patient Active Problem List   Diagnosis Date Noted  . Cerumen impaction 12/13/2017  . Thrombocytosis ([place]) 12/02/2017  . Facial paresthesia 12/02/2017  . Palpitations 04/02/2017  . Non-cardiac chest pain 05/23/2016  . CAD (coronary artery disease), native coronary artery 05/23/2016  . Chest pain 12/29/2015  . Abnormal stress test 12/29/2015  . Type 2 diabetes mellitus with circulatory disorder, without long-term current use of insulin ([place]) 06/17/2015  . Routine general medical examination at a health care facility 06/14/2015  . Hearing loss of aging 06/14/2015  . 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 Chronic neck and back pain 06/15/2014  . Vitreomacular adhesion 05/05/2013  . Unspecified asthma(493.90) 04/23/2013  .  Personal history of colonic polyps 03/03/2013  . Post-menopausal 10/17/2012  . Obesity 10/31/2011  . IBS (irritable bowel syndrome) 08/10/2011  . GERD 07/31/2010  . Allergic rhinitis 12/05/2007  . HYPERTENSION, BENIGN ESSENTIAL 02/05/2007  . TOBACCO USE, QUIT 02/05/2007  . Hyperlipidemia 01/31/2007  . Anxiety 01/31/2007  . Osteoarthritis 01/31/2007  . DIVERTICULITIS, HX OF 01/31/2007   Past Medical History:  Diagnosis Date  . Allergic rhinitis   . Anxiety   . Arthritis    OA  . Asthma   . CAD (coronary artery disease), native coronary artery 05/23/2016   Non obstructive with 40% mid LAD and 60% ramus  . Complication of anesthesia    gas bubble rt eye from an old retinal detachment  . Diabetes mellitus    type II  . Diverticulitis    Hx of  . Full dentures   . GERD (gastroesophageal reflux disease)   . Hematuria    HX OF MICROSCOPIC BLOOD IN URINE AND HX OF CYST ON ONE KIDNEY--BUT NO KIDNEY DISEASE  . Hyperlipidemia   . Hypertension   . IBS (irritable bowel syndrome)   . Osteopenia   . Wears glasses    Past Surgical History:  Procedure Laterality Date  . ABDOMINAL HYSTERECTOMY    . APPENDECTOMY  per pt  . CARDIAC CATHETERIZATION N/A 12/29/2015   Procedure: Left Heart Cath and Coronary Angiography;  Surgeon: Belva Crome, MD;  Location: [facility];  Service: Cardiovascular;  Laterality: N/A;  . CARPAL TUNNEL RELEASE     both hands  . CHOLECYSTECTOMY    . COLONOSCOPY    . EYE SURGERY     BILATERAL CATARACTS REMOVED  . EYE SURGERY     gas bubble in rt eye from a retinal detachment  . JOINT REPLACEMENT  2000   LEFT TOTAL KNEE ARTHROPLASTY  . JOINT REPLACEMENT  2013   rt total knee  . KNEE ARTHROSCOPY  1990   chondromalacia   . TOTAL KNEE ARTHROPLASTY  12/05/2011   Procedure: TOTAL KNEE ARTHROPLASTY;  Surgeon: Magnus Sinning, MD;  Location: [facility];  Service: Orthopedics;  Laterality: Right;  . ULNAR NERVE TRANSPOSITION Right 09/15/2014   Procedure:  Right cubital tunnel release with transposition of ulnar nerve;  Surgeon: Marianna Payment, MD;  Location: [facility];  Service: Orthopedics;  Laterality: Right;   Social History   Tobacco Use  . Smoking status: Former Smoker    Packs/day: 1.00    Years: 48.00    Pack years: 48.00    Last attempt to quit: 08/20/2009    Years since quitting: 8.5  . Smokeless tobacco: Never Used  Substance Use Topics  . Alcohol use: No    Alcohol/week: 0.0 oz  . Drug use: No   Family History  Problem Relation Age of Onset  . Pneumonia Mother   . Diabetes Father   . Cancer Father        of the gallbladder  . Heart disease Maternal Grandmother   . Heart disease Paternal Grandmother   . Diabetes Brother    Allergies  Allergen Reactions  . Naproxen Rash    Trouble breathing  . Naproxen Sodium Rash    Trouble breathing  . Aspirin     REACTION: burns stomach PT STATES SHE DOES TOLERATE THE 81 MG ASPIRIN DAILY  . Atorvastatin     REACTION: muscle pain  . Buspar [Buspirone Hcl]     Multiple side eff  . Gabapentin Other (See Comments)    Palpitations/ dizziness   . Metformin And Related     Mm aches, palpitations, SOB, worse eyesight, loss of balance, sweating, low CBGs  . Metoprolol Other (See Comments)    dizziness  . Norco [Hydrocodone-Acetaminophen] Other (See Comments)    Dizzy and problems remembering   . Pravastatin     Leg pain  . Simvastatin     REACTION: GI side eff and swelling  . Sulfa Antibiotics Other (See Comments)    Severe joint pain  . Sulfonamide Derivatives     REACTION: reaction not known  . Zetia [Ezetimibe]     Caused dizziness   Current Outpatient Medications on File Prior to Visit  Medication Sig Dispense Refill  . ACCU-CHEK FASTCLIX LANCETS MISC TEST FOUR TIMES DAILY AS DIRECTED 200 each 3  . acetaminophen (TYLENOL) 325 MG tablet Take 650 mg by mouth every 6 (six) hours as needed (pain).    Marland Kitchen albuterol (VENTOLIN HFA) 108 (90 Base) MCG/ACT  inhaler Inhale 2 puffs into the lungs every 6 (six) hours as needed for wheezing or shortness of breath. Reported on 12/30/2015 3 Inhaler 3  . aspirin 81 MG tablet Take 81 mg by mouth daily.    . Blood Glucose Monitoring Suppl (ONETOUCH VERIO) w/Device KIT  1 kit by Does not apply route as directed. 1 kit 0  . cetirizine (ZYRTEC) 10 MG tablet Take 10 mg by mouth daily as needed for allergies.    . fluticasone (FLONASE) 50 MCG/ACT nasal spray Place 2 sprays into both nostrils daily. 16 g 6  . glipiZIDE (GLIPIZIDE XL) 5 MG 24 hr tablet Take 1 tablet (5 mg total) by mouth daily with breakfast. 90 tablet 3  . glucose blood (ACCU-CHEK GUIDE) test strip Use as instructed to test once daily, DX E11.59 100 each 12  . hydrochlorothiazide (HYDRODIURIL) 25 MG tablet Take 1 tablet (25 mg total) by mouth daily. 90 tablet 3  . Lancets (ONETOUCH ULTRASOFT) lancets Use as instructed 100 each 12  . nitroGLYCERIN (NITROSTAT) 0.4 MG SL tablet Place 1 tablet (0.4 mg total) under the tongue every 5 (five) minutes as needed for chest pain. 25 tablet 3  . omeprazole (PRILOSEC) 20 MG capsule TAKE 1 CAPSULE (20 MG TOTAL) BY MOUTH DAILY. 90 capsule 2  . PARoxetine (PAXIL) 10 MG tablet Take 0.5 tablets (5 mg total) by mouth daily. 15 tablet 11  . sitaGLIPtin (JANUVIA) 100 MG tablet Take 1 tablet (100 mg total) by mouth daily. (Patient not taking: Reported on 03/18/2018) 30 tablet 11   No current facility-administered medications on file prior to visit.     Review of Systems  Constitutional: Positive for fatigue. Negative for activity change, appetite change, fever and unexpected weight change.  HENT: Negative for congestion, ear pain, rhinorrhea, sinus pressure and sore throat.   Eyes: Negative for pain, redness and visual disturbance.  Respiratory: Negative for cough, shortness of breath and wheezing.   Cardiovascular: Negative for chest pain and palpitations.  Gastrointestinal: Negative for abdominal pain, blood in stool,  constipation and diarrhea.  Endocrine: Negative for polydipsia and polyuria.  Genitourinary: Negative for dysuria, frequency and urgency.  Musculoskeletal: Negative for arthralgias, back pain and myalgias.       Former arm pain is better  Skin: Negative for pallor and rash.  Allergic/Immunologic: Negative for environmental allergies.  Neurological: Negative for dizziness, syncope and headaches.       Former eye twitch is better   Hematological: Negative for adenopathy. Does not bruise/bleed easily.  Psychiatric/Behavioral: Negative for decreased concentration and dysphoric mood. The patient is not nervous/anxious.        Objective:   Physical Exam  Constitutional: She appears well-developed and well-nourished. No distress.  obese and well appearing   HENT:  Head: Normocephalic and atraumatic.  Mouth/Throat: Oropharynx is clear and moist.  Eyes: Pupils are equal, round, and reactive to light. Conjunctivae and EOM are normal.  Neck: Normal range of motion. Neck supple. No JVD present. Carotid bruit is not present. No thyromegaly present.  Cardiovascular: Normal rate, regular rhythm, normal heart sounds and intact distal pulses. Exam reveals no gallop.  Pulmonary/Chest: Effort normal and breath sounds normal. No respiratory distress. She has no wheezes. She has no rales.  No crackles  Abdominal: She exhibits no abdominal bruit.  Musculoskeletal: She exhibits no edema.  Lymphadenopathy:    She has no cervical adenopathy.  Neurological: She is alert. She has normal reflexes. No cranial nerve deficit. Coordination normal.  Walks well with walker  No tremor  No eye twitch today  Skin: Skin is warm and dry. No rash noted. No pallor.  Psychiatric: She has a normal mood and affect.  Cheerful  Fatigued from caregiving           Assessment &  Plan:   Problem List Items Addressed This Visit      Cardiovascular and Mediastinum   HYPERTENSION, BENIGN ESSENTIAL - Primary    Both  blood pressure and blood sugar are on the rise  Wt is stable  BP Readings from Last 3 Encounters:  03/18/18 (!) 156/84  03/13/18 (!) 150/88  12/13/17 135/70   No change in diet- improved lately  Will inc lisinopril back to 10 mg daily /well tolerated in the past Enc healthy lifestyle habits Will also cut coffee to one cup per day  She will check bp at home-alert [place] if no imp in 1-2 weeks Goal less than 140/90 with no side eff Also will have checked with Dr Allayne Gitelman loss encouraged       Relevant Medications   lisinopril (PRINIVIL,ZESTRIL) 10 MG tablet   Type 2 diabetes mellitus with circulatory disorder, without long-term current use of insulin ([facility]) (Chronic)    Sees Dr Cruzita Lederer Lab Results  Component Value Date   HGBA1C 7.6 (A) 03/13/2018   Pt was px Januvia but cannot afford it  She will call and let her know she did not start it       Relevant Medications   lisinopril (PRINIVIL,ZESTRIL) 10 MG tablet

## 2018-03-19 ENCOUNTER — Telehealth: Payer: Self-pay | Admitting: Internal Medicine

## 2018-03-19 ENCOUNTER — Telehealth: Payer: Self-pay | Admitting: Emergency Medicine

## 2018-03-19 MED ORDER — GLIPIZIDE ER 5 MG PO TB24: 10.0000 mg | ORAL_TABLET | Freq: Every day | ORAL | 3 refills | Status: DC

## 2018-03-19 NOTE — Telephone Encounter (Signed)
Pt called and asked that you return her call. She wants to talk to you about her sitaGLIPtin (JANUVIA) 100 MG tablet that she has been taking. Thanks.

## 2018-03-19 NOTE — Telephone Encounter (Signed)
OK to try to double Glipizide in am. Please watch for lows.

## 2018-03-19 NOTE — Addendum Note (Signed)
Addended by: Drucilla Schmidt on: 03/19/2018 10:43 AM   Modules accepted: Orders

## 2018-03-19 NOTE — Telephone Encounter (Signed)
Please send all prescriptions to [facility] (she only uses [facility] when an emergency)

## 2018-03-19 NOTE — Telephone Encounter (Signed)
Pt stated that the Januvia is $45 dollars and she can not afford that. She would like to know if she can just double her Glipizide prescription. Please advise

## 2018-03-19 NOTE — Telephone Encounter (Signed)
Noted  

## 2018-04-04 ENCOUNTER — Telehealth: Payer: Self-pay | Admitting: Family Medicine

## 2018-04-04 NOTE — Telephone Encounter (Signed)
MT-She said that "Tell her I said thank you and that I do feel good except that old knee and she'll know what I'm talking about"/thx dmf

## 2018-04-04 NOTE — Telephone Encounter (Signed)
I reviewed the BP readings she dropped off and they look better (mostly 003L systolic)  Stay on current medication  Hope she is feeling good

## 2018-04-23 ENCOUNTER — Telehealth: Payer: Self-pay | Admitting: Internal Medicine

## 2018-04-23 NOTE — Telephone Encounter (Signed)
Patient needs RX for Accucheck Guide Meter Test Strips (needs 90 day supply-she checks herself 4 x per day) sent to [facility]

## 2018-04-25 MED ORDER — GLUCOSE BLOOD VI STRP: ORAL_STRIP | 12 refills | Status: DC

## 2018-04-25 NOTE — Telephone Encounter (Signed)
Sent!

## 2018-05-28 ENCOUNTER — Telehealth: Payer: Self-pay | Admitting: Family Medicine

## 2018-05-28 MED ORDER — ALPRAZOLAM 0.5 MG PO TABS: 0.2500 mg | ORAL_TABLET | Freq: Every day | ORAL | 0 refills | Status: DC | PRN

## 2018-05-28 NOTE — Telephone Encounter (Signed)
Pt notified of all of Dr. Marliss Coots instructions and pt verbalized understanding. Pt will increase the paxil to one tab daily and update [place] how she's doing on increase dose

## 2018-05-28 NOTE — Telephone Encounter (Signed)
Copied from [place] [id number]. Topic: Quick Communication - Rx Refill/Question >> May 28, 2018 10:59 AM Reyne Dumas L wrote: Medication:  Request for medication for "nerves".  Pt states that husband fell down 13 stairs as he had a major stroke.  Spouse was just brought home from the hospital.  Pt states she is just tired (is having to deal with care giving and insurance) pt is very emotional on the phone.  Pt states that Dr. Glori Bickers has given her something for this before - she just can't remember what.  Pt is unable to drive at this time so she can't come in for a visit.  Pt states that [id number] [address], [place] [phone] (Phone) [phone] (Fax)  Pt can be reached at [phone].

## 2018-05-28 NOTE — Telephone Encounter (Signed)
I would like her to increase her paroxetine from 1/2 to 1 whole pill daily  If she tolerates it well let me know and I will re write px for her   I will send in xanax to use only for severe anxiety  - this will sedate/is habit forming and can cause falls so use sparingly with great caution (she has had it before)   Encourage her to get help from friends/family whenever possible Also talk with pastor for counseling if that is an option

## 2018-07-01 ENCOUNTER — Other Ambulatory Visit: Payer: Self-pay

## 2018-07-01 MED ORDER — GLUCOSE BLOOD VI STRP: ORAL_STRIP | 12 refills | Status: DC

## 2018-07-11 ENCOUNTER — Encounter: Payer: Self-pay | Admitting: Internal Medicine

## 2018-07-11 ENCOUNTER — Ambulatory Visit: Payer: Medicare HMO | Admitting: Internal Medicine

## 2018-07-11 VITALS — BP 140/80 | HR 79 | Ht 60.5 in | Wt 185.0 lb

## 2018-07-11 DIAGNOSIS — Z6835 Body mass index (BMI) 35.0-35.9, adult: Secondary | ICD-10-CM | POA: Diagnosis not present

## 2018-07-11 DIAGNOSIS — E785 Hyperlipidemia, unspecified: Secondary | ICD-10-CM

## 2018-07-11 DIAGNOSIS — E1159 Type 2 diabetes mellitus with other circulatory complications: Secondary | ICD-10-CM | POA: Diagnosis not present

## 2018-07-11 DIAGNOSIS — Z23 Encounter for immunization: Secondary | ICD-10-CM | POA: Diagnosis not present

## 2018-07-11 LAB — POCT GLYCOSYLATED HEMOGLOBIN (HGB A1C): HEMOGLOBIN A1C: 6.6 % — AB (ref 4.0–5.6)

## 2018-07-11 NOTE — Progress Notes (Signed)
Patient ID: [name], female   DOB: 1940-11-01, 77 y.o.   MRN: [id number]  HPI: Erin Beck is a 77 y.o.-year-old female, returning for f/u for DM2, non-insulin-dependent, dx 2005, uncontrolled, with complications (CAD). Last visit 4 months ago.  Husband has aggressive bladder CA. Since last OV, he had a brain hemorrhage after a fall.  She is tearful today in the office describing this.  However, he is doing better.  Last hemoglobin A1c was: Lab Results  Component Value Date   HGBA1C 7.6 (A) 03/13/2018   HGBA1C 6.4 09/06/2017   HGBA1C 6.7 02/25/2017  Labs from Dr. Erlinda Hong [address], 08/30/2014): HbA1c 6.9%, decreased from 8.0%  Pt is on a regimen of: - Glipizide XL 5 mg in am We tried Januvia 100  in a.m.>> $$$. Stopped Metformin ER 1500 mg in pm before cath but she was not feeling well on it (diarrhea, weakness, thirst, leg swelling, blurry vision) She has not been on insulin before, would not like to stick herself.  Pt checks her sugars 2-3 times a day >> improved: - am: 8108-138 >> 102-135 >> 154-175 >> 119-145, 151, 158 - 2h after b'fast: 97, 154 >> n/c - before lunch: 82-138, 155 >> 65, 88-128 >> n/c >> 91, 104-130, 145 - 2h after lunch: 76, 111-140 >> 93-154 >> n/c - before dinner:89-124 >> 111, 132, 154 >> 90, 102-134 - 2h after dinner: 110-140, 168 >> n/c >> 119 >> 93-136 - bedtime:  132 >> 121, 137 >> n/c - nighttime: 114-122 >> 121-127 >> n/c Lowest sugar was 80 >> 65 x1 >> 111 >> 102;  she has hypoglycemia awareness in the 80s Highest sugar was 155 >> 137 >> 175 >> 173.  Pt's meals are: - Breakfast: cereals - Lunch: salads, 1 sandwich, leftovers - Dinner: salmon/chicken, vegetables - Snacks: 1 fruit, crackers - not every day No soft drinks  -No CKD, last BUN/creatinine:  Lab Results  Component Value Date   BUN 13 11/25/2017   CREATININE 0.82 11/25/2017  On lisinopril. -+ HL; last set of lipids: Lab Results  Component Value Date   CHOL 243 (H)  11/25/2017   HDL 46.00 11/25/2017   LDLCALC 171 (H) 11/25/2017   LDLDIRECT 154.0 06/10/2015   TRIG 128.0 11/25/2017   CHOLHDL 5 11/25/2017  She had abdominal pain with statins.  She is also intolerant to ezetimibe. - last eye exam was in 02/2018: No DR. H/o cataract. Dr. Zadie Rhine. - no numbness and tingling in her feet.  ROS: Constitutional: no weight gain/no weight loss, no fatigue, no subjective hyperthermia, no subjective hypothermia Eyes: no blurry vision, no xerophthalmia ENT: no sore throat, no nodules palpated in neck, no dysphagia, no odynophagia, no hoarseness Cardiovascular: no CP/no SOB/no palpitations/no leg swelling Respiratory: no cough/no SOB/no wheezing Gastrointestinal: no N/no V/no D/no C/no acid reflux Musculoskeletal: no muscle aches/no joint aches Skin: no rashes, no hair loss Neurological: no tremors/no numbness/no tingling/no dizziness  I reviewed pt's medications, allergies, PMH, social hx, family hx, and changes were documented in the history of present illness. Otherwise, unchanged from my initial visit note.  Past Medical History:  Diagnosis Date  . Allergic rhinitis   . Anxiety   . Arthritis    OA  . Asthma   . CAD (coronary artery disease), native coronary artery 05/23/2016   Non obstructive with 40% mid LAD and 60% ramus  . Complication of anesthesia    gas bubble rt eye from an old retinal detachment  . Diabetes  mellitus    type II  . Diverticulitis    Hx of  . Full dentures   . GERD (gastroesophageal reflux disease)   . Hematuria    HX OF MICROSCOPIC BLOOD IN URINE AND HX OF CYST ON ONE KIDNEY--BUT NO KIDNEY DISEASE  . Hyperlipidemia   . Hypertension   . IBS (irritable bowel syndrome)   . Osteopenia   . Wears glasses    Past Surgical History:  Procedure Laterality Date  . ABDOMINAL HYSTERECTOMY    . APPENDECTOMY     per pt  . CARDIAC CATHETERIZATION N/A 12/29/2015   Procedure: Left Heart Cath and Coronary Angiography;  Surgeon: Belva Crome, MD;  Location: [facility];  Service: Cardiovascular;  Laterality: N/A;  . CARPAL TUNNEL RELEASE     both hands  . CHOLECYSTECTOMY    . COLONOSCOPY    . EYE SURGERY     BILATERAL CATARACTS REMOVED  . EYE SURGERY     gas bubble in rt eye from a retinal detachment  . JOINT REPLACEMENT  2000   LEFT TOTAL KNEE ARTHROPLASTY  . JOINT REPLACEMENT  2013   rt total knee  . KNEE ARTHROSCOPY  1990   chondromalacia   . TOTAL KNEE ARTHROPLASTY  12/05/2011   Procedure: TOTAL KNEE ARTHROPLASTY;  Surgeon: Magnus Sinning, MD;  Location: [facility];  Service: Orthopedics;  Laterality: Right;  . ULNAR NERVE TRANSPOSITION Right 09/15/2014   Procedure: Right cubital tunnel release with transposition of ulnar nerve;  Surgeon: Marianna Payment, MD;  Location: [facility];  Service: Orthopedics;  Laterality: Right;   Social History   Socioeconomic History  . Marital status: Married    Spouse name: Not on file  . Number of children: Not on file  . Years of education: Not on file  . Highest education level: Not on file  Occupational History  . Not on file  Social Needs  . Financial resource strain: Not on file  . Food insecurity:    Worry: Not on file    Inability: Not on file  . Transportation needs:    Medical: Not on file    Non-medical: Not on file  Tobacco Use  . Smoking status: Former Smoker    Packs/day: 1.00    Years: 48.00    Pack years: 48.00    Last attempt to quit: 08/20/2009    Years since quitting: 8.8  . Smokeless tobacco: Never Used  Substance and Sexual Activity  . Alcohol use: No    Alcohol/week: 0.0 standard drinks  . Drug use: No  . Sexual activity: Never  Lifestyle  . Physical activity:    Days per week: Not on file    Minutes per session: Not on file  . Stress: Not on file  Relationships  . Social connections:    Talks on phone: Not on file    Gets together: Not on file    Attends religious service: Not on file    Active member of club  or organization: Not on file    Attends meetings of clubs or organizations: Not on file    Relationship status: Not on file  . Intimate partner violence:    Fear of current or ex partner: Not on file    Emotionally abused: Not on file    Physically abused: Not on file    Forced sexual activity: Not on file  Other Topics Concern  . Not on file  Social History  Narrative  . Not on file   Current Outpatient Medications on File Prior to Visit  Medication Sig Dispense Refill  . ACCU-CHEK FASTCLIX LANCETS MISC TEST FOUR TIMES DAILY AS DIRECTED 200 each 3  . acetaminophen (TYLENOL) 325 MG tablet Take 650 mg by mouth every 6 (six) hours as needed (pain).    Marland Kitchen albuterol (VENTOLIN HFA) 108 (90 Base) MCG/ACT inhaler Inhale 2 puffs into the lungs every 6 (six) hours as needed for wheezing or shortness of breath. Reported on 12/30/2015 3 Inhaler 3  . ALPRAZolam (XANAX) 0.5 MG tablet Take 0.5-1 tablets (0.25-0.5 mg total) by mouth daily as needed for anxiety or sleep. 30 tablet 0  . aspirin 81 MG tablet Take 81 mg by mouth daily.    . Blood Glucose Monitoring Suppl (ONETOUCH VERIO) w/Device KIT 1 kit by Does not apply route as directed. 1 kit 0  . cetirizine (ZYRTEC) 10 MG tablet Take 10 mg by mouth daily as needed for allergies.    . fluticasone (FLONASE) 50 MCG/ACT nasal spray Place 2 sprays into both nostrils daily. 16 g 6  . glipiZIDE (GLIPIZIDE XL) 5 MG 24 hr tablet Take 2 tablets (10 mg total) by mouth daily with breakfast. 180 tablet 3  . glucose blood (ACCU-CHEK GUIDE) test strip Use as instructed to test once daily 100 each 12  . hydrochlorothiazide (HYDRODIURIL) 25 MG tablet Take 1 tablet (25 mg total) by mouth daily. 90 tablet 3  . Lancets (ONETOUCH ULTRASOFT) lancets Use as instructed 100 each 12  . lisinopril (PRINIVIL,ZESTRIL) 10 MG tablet Take 1 tablet (10 mg total) by mouth daily. 90 tablet 3  . nitroGLYCERIN (NITROSTAT) 0.4 MG SL tablet Place 1 tablet (0.4 mg total) under the tongue  every 5 (five) minutes as needed for chest pain. 25 tablet 3  . omeprazole (PRILOSEC) 20 MG capsule TAKE 1 CAPSULE (20 MG TOTAL) BY MOUTH DAILY. 90 capsule 2  . PARoxetine (PAXIL) 10 MG tablet Take 0.5 tablets (5 mg total) by mouth daily. 15 tablet 11   No current facility-administered medications on file prior to visit.    Allergies  Allergen Reactions  . Naproxen Rash    Trouble breathing  . Naproxen Sodium Rash    Trouble breathing  . Aspirin     REACTION: burns stomach PT STATES SHE DOES TOLERATE THE 81 MG ASPIRIN DAILY  . Atorvastatin     REACTION: muscle pain  . Buspar [Buspirone Hcl]     Multiple side eff  . Gabapentin Other (See Comments)    Palpitations/ dizziness   . Metformin And Related     Mm aches, palpitations, SOB, worse eyesight, loss of balance, sweating, low CBGs  . Metoprolol Other (See Comments)    dizziness  . Norco [Hydrocodone-Acetaminophen] Other (See Comments)    Dizzy and problems remembering   . Pravastatin     Leg pain  . Simvastatin     REACTION: GI side eff and swelling  . Sulfa Antibiotics Other (See Comments)    Severe joint pain  . Sulfonamide Derivatives     REACTION: reaction not known  . Zetia [Ezetimibe]     Caused dizziness   Family History  Problem Relation Age of Onset  . Pneumonia Mother   . Diabetes Father   . Cancer Father        of the gallbladder  . Heart disease Maternal Grandmother   . Heart disease Paternal Grandmother   . Diabetes Brother     PE:  BP 140/80   Pulse 79   Ht 5' 0.5" (1.537 m) Comment: measured  Wt 185 lb (83.9 kg)   SpO2 98%   BMI 35.54 kg/m  Body mass index is 35.54 kg/m. Wt Readings from Last 3 Encounters:  07/11/18 185 lb (83.9 kg)  03/18/18 185 lb 12 oz (84.3 kg)  03/13/18 187 lb 3.2 oz (84.9 kg)   Constitutional: overweight, in NAD, walks with a walker Eyes: PERRLA, EOMI, no exophthalmos ENT: moist mucous membranes, no thyromegaly, no cervical lymphadenopathy Cardiovascular: RRR, No  MRG Respiratory: CTA B Gastrointestinal: abdomen soft, NT, ND, BS+ Musculoskeletal: no deformities, strength intact in all 4 Skin: moist, warm, no rashes Neurological: no tremor with outstretched hands, DTR normal in all 4  ASSESSMENT: 1. DM2, non-insulin-dependent, uncontrolled, with complications - CAD  2. HL  3.  Obesity  PLAN:  1. Patient with longstanding, well-controlled diabetes, only on glipizide XL.  At last visit, sugars are higher as her sugars were late and she and her husband were eating out more.  We tried to add Januvia but this was not covered by insurance.  She continued glipizide XL, but started to improve her and her husband's diet (he is also diabetic) and the sugars are now almost all at goal or very close to goal. -No need to intensify the regimen for now - I suggested to:  Patient Instructions  Please continue: - Glipizide XL 5 mg in am  Try Benecol spread.  Please return in 6 months with your sugar log.   - today, HbA1c is 6.6% (improved) - continue checking sugars at different times of the day - check 1x a day, rotating checks - advised for yearly eye exams >> she is UTD - refuses flu shot - Return to clinic in 6 mo with sugar log    2. HL - Reviewed latest lipid panel from 11/2017: LDL very high and increased from 2016, total cholesterol also high Lab Results  Component Value Date   CHOL 243 (H) 11/25/2017   HDL 46.00 11/25/2017   LDLCALC 171 (H) 11/25/2017   LDLDIRECT 154.0 06/10/2015   TRIG 128.0 11/25/2017   CHOLHDL 5 11/25/2017  -Intolerant to statins and ezetimibe -Again refuses PCSK9 inhibitors today -I recommended Benecolspread  3. Obesity -Continues to work on her diet, cutting down sweets -Did not lose weight since last visit  Philemon Kingdom, MD PhD [facility] Endocrinology

## 2018-07-11 NOTE — Patient Instructions (Addendum)
Please continue: - Glipizide XL 5 mg in am  Try Benecol spread.  Please return in 6 months with your sugar log.

## 2018-08-27 ENCOUNTER — Telehealth: Payer: Self-pay | Admitting: Family Medicine

## 2018-08-27 MED ORDER — PAROXETINE HCL 10 MG PO TABS: 5.0000 mg | ORAL_TABLET | Freq: Every day | ORAL | 4 refills | Status: DC

## 2018-08-27 NOTE — Telephone Encounter (Signed)
Pt need refill for paroxetine 10mg   Sent to [facility]

## 2018-10-01 ENCOUNTER — Other Ambulatory Visit: Payer: Self-pay | Admitting: Family Medicine

## 2018-12-08 ENCOUNTER — Ambulatory Visit: Payer: Medicare HMO

## 2018-12-12 ENCOUNTER — Encounter: Payer: Medicare HMO | Admitting: Family Medicine

## 2019-01-09 ENCOUNTER — Encounter: Payer: Self-pay | Admitting: Internal Medicine

## 2019-01-09 ENCOUNTER — Other Ambulatory Visit: Payer: Self-pay

## 2019-01-09 ENCOUNTER — Ambulatory Visit (INDEPENDENT_AMBULATORY_CARE_PROVIDER_SITE_OTHER): Payer: Medicare HMO | Admitting: Internal Medicine

## 2019-01-09 DIAGNOSIS — E1159 Type 2 diabetes mellitus with other circulatory complications: Secondary | ICD-10-CM

## 2019-01-09 DIAGNOSIS — E785 Hyperlipidemia, unspecified: Secondary | ICD-10-CM

## 2019-01-09 DIAGNOSIS — E6609 Other obesity due to excess calories: Secondary | ICD-10-CM

## 2019-01-09 DIAGNOSIS — Z6833 Body mass index (BMI) 33.0-33.9, adult: Secondary | ICD-10-CM

## 2019-01-09 MED ORDER — GLIPIZIDE ER 5 MG PO TB24: 10.0000 mg | ORAL_TABLET | Freq: Every day | ORAL | 3 refills | Status: DC

## 2019-01-09 NOTE — Progress Notes (Signed)
Patient ID: Erin Beck, female   DOB: [birth year], 78 y.o.   MRN: [id number]  Patient location: Home My location: Office  Referring Provider: Abner Greenspan, MD  I connected with the patient on 01/09/19 at  2.04 PM by telephone and verified that I am speaking with the correct person.   I discussed the limitations of evaluation and management by telephone and the availability of in person appointments. The patient expressed understanding and agreed to proceed.   Details of the encounter are shown below.  HPI: Erin Beck is a 78 y.o.-year-old female, presenting for f/u for DM2, non-insulin-dependent, dx 2005, uncontrolled, with complications (CAD). Last visit 6 months ago.  She continues to be stressed with her husband having aggressive bladder cancer.  Last hemoglobin A1c was: Lab Results  Component Value Date   HGBA1C 6.6 (A) 07/11/2018   HGBA1C 7.6 (A) 03/13/2018   HGBA1C 6.4 09/06/2017  Labs from Dr. Erlinda Hong [address], 08/30/2014): HbA1c 6.9%, decreased from 8.0%  Pt is on a regimen of: - Glipizide XL 10 mg in am We tried Januvia 100  in a.m.>> $$$. Stopped Metformin ER 1500 mg in pm before cath but she was not feeling well on it (diarrhea, weakness, thirst, leg swelling, blurry vision) She has not been on insulin before, would not like to stick herself.  Pt checks her sugars 2-3 times a day: - am: 154-175 >> 119-145, 151, 158 >> 133-145 - 2h after b'fast: 97, 154 >> n/c  - before lunch: 65, 88-128 >> n/c >> 91, 104-130, 145 >> n/c - 2h after lunch: 76, 111-140 >> 93-154 >> n/c - before dinner: 111, 132, 154 >> 90, 102-134 >> n/c - 2h after dinner: 110-140, 168 >> n/c >> 119 >> 93-136 >> n/c - bedtime:  132 >> 121, 137 >> n/c - nighttime: 114-122 >> 121-127 >> n/c Lowest sugar was 102 >> 100;  she has hypoglycemia awareness in the 80. Highest sugar was 173 >> 150.  Pt's meals are: - Breakfast: cereals - Lunch: salads, 1 sandwich, leftovers - Dinner:  salmon/chicken, vegetables - Snacks: 1 fruit, crackers   -No CKD, last BUN/creatinine:  Lab Results  Component Value Date   BUN 13 11/25/2017   CREATININE 0.82 11/25/2017  On lisinopril. -+ HL; last set of lipids: Lab Results  Component Value Date   CHOL 243 (H) 11/25/2017   HDL 46.00 11/25/2017   LDLCALC 171 (H) 11/25/2017   LDLDIRECT 154.0 06/10/2015   TRIG 128.0 11/25/2017   CHOLHDL 5 11/25/2017  She had abdominal pain with statins.  She is also intolerant to ezetimibe. - last eye exam was in 02/2018: No DR h/o cataract. Dr. Zadie Rhine. - no numbness and tingling in her feet.  ROS: Constitutional: + weight gain/no weight loss, no fatigue, no subjective hyperthermia, no subjective hypothermia Eyes: no blurry vision, no xerophthalmia ENT: no sore throat, no nodules palpated in neck, no dysphagia, no odynophagia, + hoarseness, + congestion Cardiovascular: no CP/no SOB/no palpitations/no leg swelling Respiratory: no cough/no SOB/no wheezing Gastrointestinal: no N/no V/no D/no C/no acid reflux Musculoskeletal: no muscle aches/+ joint aches (knees) Skin: no rashes, no hair loss Neurological: no tremors/no numbness/no tingling/no dizziness  I reviewed pt's medications, allergies, PMH, social hx, family hx, and changes were documented in the history of present illness. Otherwise, unchanged from my initial visit note.  Past Medical History:  Diagnosis Date  . Allergic rhinitis   . Anxiety   . Arthritis    OA  . Asthma   .  CAD (coronary artery disease), native coronary artery 05/23/2016   Non obstructive with 40% mid LAD and 60% ramus  . Complication of anesthesia    gas bubble rt eye from an old retinal detachment  . Diabetes mellitus    type II  . Diverticulitis    Hx of  . Full dentures   . GERD (gastroesophageal reflux disease)   . Hematuria    HX OF MICROSCOPIC BLOOD IN URINE AND HX OF CYST ON ONE KIDNEY--BUT NO KIDNEY DISEASE  . Hyperlipidemia   . Hypertension   .  IBS (irritable bowel syndrome)   . Osteopenia   . Wears glasses    Past Surgical History:  Procedure Laterality Date  . ABDOMINAL HYSTERECTOMY    . APPENDECTOMY     per pt  . CARDIAC CATHETERIZATION N/A 12/29/2015   Procedure: Left Heart Cath and Coronary Angiography;  Surgeon: Belva Crome, MD;  Location: [facility];  Service: Cardiovascular;  Laterality: N/A;  . CARPAL TUNNEL RELEASE     both hands  . CHOLECYSTECTOMY    . COLONOSCOPY    . EYE SURGERY     BILATERAL CATARACTS REMOVED  . EYE SURGERY     gas bubble in rt eye from a retinal detachment  . JOINT REPLACEMENT  2000   LEFT TOTAL KNEE ARTHROPLASTY  . JOINT REPLACEMENT  2013   rt total knee  . KNEE ARTHROSCOPY  1990   chondromalacia   . TOTAL KNEE ARTHROPLASTY  12/05/2011   Procedure: TOTAL KNEE ARTHROPLASTY;  Surgeon: Magnus Sinning, MD;  Location: [facility];  Service: Orthopedics;  Laterality: Right;  . ULNAR NERVE TRANSPOSITION Right 09/15/2014   Procedure: Right cubital tunnel release with transposition of ulnar nerve;  Surgeon: Marianna Payment, MD;  Location: [facility];  Service: Orthopedics;  Laterality: Right;   Social History   Socioeconomic History  . Marital status: Married    Spouse name: Not on file  . Number of children: Not on file  . Years of education: Not on file  . Highest education level: Not on file  Occupational History  . Not on file  Social Needs  . Financial resource strain: Not on file  . Food insecurity:    Worry: Not on file    Inability: Not on file  . Transportation needs:    Medical: Not on file    Non-medical: Not on file  Tobacco Use  . Smoking status: Former Smoker    Packs/day: 1.00    Years: 48.00    Pack years: 48.00    Last attempt to quit: 08/20/2009    Years since quitting: 9.3  . Smokeless tobacco: Never Used  Substance and Sexual Activity  . Alcohol use: No    Alcohol/week: 0.0 standard drinks  . Drug use: No  . Sexual activity: Never   Lifestyle  . Physical activity:    Days per week: Not on file    Minutes per session: Not on file  . Stress: Not on file  Relationships  . Social connections:    Talks on phone: Not on file    Gets together: Not on file    Attends religious service: Not on file    Active member of club or organization: Not on file    Attends meetings of clubs or organizations: Not on file    Relationship status: Not on file  . Intimate partner violence:    Fear of current or ex partner: Not  on file    Emotionally abused: Not on file    Physically abused: Not on file    Forced sexual activity: Not on file  Other Topics Concern  . Not on file  Social History Narrative  . Not on file   Current Outpatient Medications on File Prior to Visit  Medication Sig Dispense Refill  . ACCU-CHEK FASTCLIX LANCETS MISC TEST FOUR TIMES DAILY AS DIRECTED 200 each 3  . acetaminophen (TYLENOL) 325 MG tablet Take 650 mg by mouth every 6 (six) hours as needed (pain).    Marland Kitchen albuterol (VENTOLIN HFA) 108 (90 Base) MCG/ACT inhaler Inhale 2 puffs into the lungs every 6 (six) hours as needed for wheezing or shortness of breath. Reported on 12/30/2015 3 Inhaler 3  . ALPRAZolam (XANAX) 0.5 MG tablet Take 0.5-1 tablets (0.25-0.5 mg total) by mouth daily as needed for anxiety or sleep. 30 tablet 0  . aspirin 81 MG tablet Take 81 mg by mouth daily.    . Blood Glucose Monitoring Suppl (ONETOUCH VERIO) w/Device KIT 1 kit by Does not apply route as directed. 1 kit 0  . cetirizine (ZYRTEC) 10 MG tablet Take 10 mg by mouth daily as needed for allergies.    . fluticasone (FLONASE) 50 MCG/ACT nasal spray Place 2 sprays into both nostrils daily. 16 g 6  . glipiZIDE (GLIPIZIDE XL) 5 MG 24 hr tablet Take 2 tablets (10 mg total) by mouth daily with breakfast. 180 tablet 3  . glucose blood (ACCU-CHEK GUIDE) test strip Use as instructed to test once daily 100 each 12  . hydrochlorothiazide (HYDRODIURIL) 25 MG tablet Take 1 tablet (25 mg total)  by mouth daily. 90 tablet 3  . Lancets (ONETOUCH ULTRASOFT) lancets Use as instructed 100 each 12  . lisinopril (PRINIVIL,ZESTRIL) 10 MG tablet Take 1 tablet (10 mg total) by mouth daily. 90 tablet 3  . nitroGLYCERIN (NITROSTAT) 0.4 MG SL tablet Place 1 tablet (0.4 mg total) under the tongue every 5 (five) minutes as needed for chest pain. 25 tablet 3  . omeprazole (PRILOSEC) 20 MG capsule TAKE 1 CAPSULE (20 MG TOTAL) BY MOUTH DAILY. 90 capsule 0  . PARoxetine (PAXIL) 10 MG tablet Take 0.5 tablets (5 mg total) by mouth daily. 45 tablet 4   No current facility-administered medications on file prior to visit.    Allergies  Allergen Reactions  . Naproxen Rash    Trouble breathing  . Naproxen Sodium Rash    Trouble breathing  . Aspirin     REACTION: burns stomach PT STATES SHE DOES TOLERATE THE 81 MG ASPIRIN DAILY  . Atorvastatin     REACTION: muscle pain  . Buspar [Buspirone Hcl]     Multiple side eff  . Gabapentin Other (See Comments)    Palpitations/ dizziness   . Metformin And Related     Mm aches, palpitations, SOB, worse eyesight, loss of balance, sweating, low CBGs  . Metoprolol Other (See Comments)    dizziness  . Norco [Hydrocodone-Acetaminophen] Other (See Comments)    Dizzy and problems remembering   . Pravastatin     Leg pain  . Simvastatin     REACTION: GI side eff and swelling  . Sulfa Antibiotics Other (See Comments)    Severe joint pain  . Sulfonamide Derivatives     REACTION: reaction not known  . Zetia [Ezetimibe]     Caused dizziness   Family History  Problem Relation Age of Onset  . Pneumonia Mother   . Diabetes  Father   . Cancer Father        of the gallbladder  . Heart disease Maternal Grandmother   . Heart disease Paternal Grandmother   . Diabetes Brother     PE: There were no vitals taken for this visit. There is no height or weight on file to calculate BMI. Wt Readings from Last 3 Encounters:  07/11/18 185 lb (83.9 kg)  03/18/18 185 lb 12  oz (84.3 kg)  03/13/18 187 lb 3.2 oz (84.9 kg)   Constitutional:  in NAD  The physical exam was not performed (telephone visit).  ASSESSMENT: 1. DM2, non-insulin-dependent, uncontrolled, with complications - CAD  2. HL  3.  Obesity  PLAN:  1. Patient with longstanding, well-controlled, diabetes, only on glipizide XL.  We tried to add Januvia in the past but this was not covered by the insurance.  Sugars improved after she and her husband (who also has diabetes) stopped eating out. - she is not checking sugars later in the day, only in am >> these are exactly like before, at or slightly above target. -I strongly advised her to start checking sugars later in the day but I will not change her regimen for now - I suggested to:  Patient Instructions  Please continue: - Glipizide XL 10 mg in am  Please return in 6 months with your sugar log.   -We will check her HbA1c when she returns to the clinic - continue checking sugars at different times of the day - check 1x a day, rotating checks - advised for yearly eye exams >> she is UTD - Return to clinic in 6 mo with sugar log    2. HL -Reviewed lipid panel from 11/2017: LDL high and increased from 2016: Lab Results  Component Value Date   CHOL 243 (H) 11/25/2017   HDL 46.00 11/25/2017   LDLCALC 171 (H) 11/25/2017   LDLDIRECT 154.0 06/10/2015   TRIG 128.0 11/25/2017   CHOLHDL 5 11/25/2017  -Intolerant to statins and ezetimibe -Refused PCSK9 inhibitors -I recommended Bennecol at last visit -We will recheck her lipids at next visit  3. Obesity -Continue to work on her diet, cutting down sweets. -She feels she gained few pounds since last visit but she is not exactly sure how much  - time spent with the patient: 13 minutes, of which >50% was spent in obtaining information about her symptoms, reviewing her previous labs, evaluations, and treatments, counseling her about her conditions (please see the discussed topics above), and  developing a plan to further investigate and treat them.  Philemon Kingdom, MD PhD [facility] Endocrinology

## 2019-01-09 NOTE — Patient Instructions (Addendum)
Please continue: - Glipizide XL 10 mg in am  Please return in 6 months with your sugar log.

## 2019-01-13 ENCOUNTER — Other Ambulatory Visit: Payer: Self-pay | Admitting: Family Medicine

## 2019-04-10 ENCOUNTER — Other Ambulatory Visit: Payer: Self-pay | Admitting: Family Medicine

## 2019-04-27 ENCOUNTER — Telehealth: Payer: Self-pay | Admitting: Family Medicine

## 2019-04-27 DIAGNOSIS — E1159 Type 2 diabetes mellitus with other circulatory complications: Secondary | ICD-10-CM

## 2019-04-27 DIAGNOSIS — E785 Hyperlipidemia, unspecified: Secondary | ICD-10-CM

## 2019-04-27 DIAGNOSIS — D473 Essential (hemorrhagic) thrombocythemia: Secondary | ICD-10-CM

## 2019-04-27 DIAGNOSIS — E1169 Type 2 diabetes mellitus with other specified complication: Secondary | ICD-10-CM

## 2019-04-27 DIAGNOSIS — I10 Essential (primary) hypertension: Secondary | ICD-10-CM

## 2019-04-27 DIAGNOSIS — D75839 Thrombocytosis, unspecified: Secondary | ICD-10-CM

## 2019-04-27 NOTE — Telephone Encounter (Signed)
-----   Message from Ellamae Sia sent at 04/21/2019 10:41 AM EDT ----- Regarding: Lab orders for Tuesday, 9.8.20 Patient is scheduled for CPX labs, please order future labs, Thanks , Karna Christmas

## 2019-04-28 ENCOUNTER — Other Ambulatory Visit (INDEPENDENT_AMBULATORY_CARE_PROVIDER_SITE_OTHER): Payer: Medicare HMO

## 2019-04-28 ENCOUNTER — Ambulatory Visit: Payer: Medicare HMO

## 2019-04-28 DIAGNOSIS — I10 Essential (primary) hypertension: Secondary | ICD-10-CM | POA: Diagnosis not present

## 2019-04-28 DIAGNOSIS — E1169 Type 2 diabetes mellitus with other specified complication: Secondary | ICD-10-CM

## 2019-04-28 DIAGNOSIS — E785 Hyperlipidemia, unspecified: Secondary | ICD-10-CM | POA: Diagnosis not present

## 2019-04-28 DIAGNOSIS — D75839 Thrombocytosis, unspecified: Secondary | ICD-10-CM

## 2019-04-28 DIAGNOSIS — D473 Essential (hemorrhagic) thrombocythemia: Secondary | ICD-10-CM | POA: Diagnosis not present

## 2019-04-28 LAB — COMPREHENSIVE METABOLIC PANEL
ALT: 41 U/L — ABNORMAL HIGH (ref 0–35)
AST: 27 U/L (ref 0–37)
Albumin: 4.2 g/dL (ref 3.5–5.2)
Alkaline Phosphatase: 86 U/L (ref 39–117)
BUN: 18 mg/dL (ref 6–23)
CO2: 28 mEq/L (ref 19–32)
Calcium: 9.2 mg/dL (ref 8.4–10.5)
Chloride: 100 mEq/L (ref 96–112)
Creatinine, Ser: 0.76 mg/dL (ref 0.40–1.20)
GFR: 73.6 mL/min (ref >60.00)
Glucose, Bld: 164 mg/dL — ABNORMAL HIGH (ref 70–99)
Potassium: 4.5 mEq/L (ref 3.5–5.1)
Sodium: 138 mEq/L (ref 135–145)
Total Bilirubin: 0.6 mg/dL (ref 0.2–1.2)
Total Protein: 7.1 g/dL (ref 6.0–8.3)

## 2019-04-28 LAB — LIPID PANEL
Cholesterol: 237 mg/dL — ABNORMAL HIGH (ref 0–200)
HDL: 40.8 mg/dL (ref >39.00)
NonHDL: 195.74
Total CHOL/HDL Ratio: 6
Triglycerides: 217 mg/dL — ABNORMAL HIGH (ref 0.0–149.0)
VLDL: 43.4 mg/dL — ABNORMAL HIGH (ref 0.0–40.0)

## 2019-04-28 LAB — CBC WITH DIFFERENTIAL/PLATELET
Basophils Absolute: 0.1 10*3/uL (ref 0.0–0.1)
Basophils Relative: 0.8 % (ref 0.0–3.0)
Eosinophils Absolute: 0.3 10*3/uL (ref 0.0–0.7)
Eosinophils Relative: 3 % (ref 0.0–5.0)
HCT: 43.1 % (ref 36.0–46.0)
Hemoglobin: 14.4 g/dL (ref 12.0–15.0)
Lymphocytes Relative: 13.7 % (ref 12.0–46.0)
Lymphs Abs: 1.3 10*3/uL (ref 0.7–4.0)
MCHC: 33.4 g/dL (ref 30.0–36.0)
MCV: 93.8 fl (ref 78.0–100.0)
Monocytes Absolute: 0.6 10*3/uL (ref 0.1–1.0)
Monocytes Relative: 6.6 % (ref 3.0–12.0)
Neutro Abs: 7 10*3/uL (ref 1.4–7.7)
Neutrophils Relative %: 75.9 % (ref 43.0–77.0)
Platelets: 376 10*3/uL (ref 150.0–400.0)
RBC: 4.6 Mil/uL (ref 3.87–5.11)
RDW: 13 % (ref 11.5–15.5)
WBC: 9.3 10*3/uL (ref 4.0–10.5)

## 2019-04-28 LAB — LDL CHOLESTEROL, DIRECT: Direct LDL: 178 mg/dL

## 2019-04-28 LAB — TSH: TSH: 1.66 u[IU]/mL (ref 0.35–4.50)

## 2019-05-06 ENCOUNTER — Encounter: Payer: Self-pay | Admitting: Family Medicine

## 2019-05-06 ENCOUNTER — Ambulatory Visit (INDEPENDENT_AMBULATORY_CARE_PROVIDER_SITE_OTHER): Payer: Medicare HMO | Admitting: Family Medicine

## 2019-05-06 ENCOUNTER — Other Ambulatory Visit: Payer: Self-pay

## 2019-05-06 VITALS — BP 125/70 | HR 70 | Temp 97.5°F | Ht 60.5 in | Wt 185.0 lb

## 2019-05-06 DIAGNOSIS — E1169 Type 2 diabetes mellitus with other specified complication: Secondary | ICD-10-CM

## 2019-05-06 DIAGNOSIS — Z0000 Encounter for general adult medical examination without abnormal findings: Secondary | ICD-10-CM

## 2019-05-06 DIAGNOSIS — H9113 Presbycusis, bilateral: Secondary | ICD-10-CM | POA: Diagnosis not present

## 2019-05-06 DIAGNOSIS — I10 Essential (primary) hypertension: Secondary | ICD-10-CM

## 2019-05-06 DIAGNOSIS — I2510 Atherosclerotic heart disease of native coronary artery without angina pectoris: Secondary | ICD-10-CM

## 2019-05-06 DIAGNOSIS — E66812 Obesity, class 2: Secondary | ICD-10-CM

## 2019-05-06 DIAGNOSIS — E1159 Type 2 diabetes mellitus with other circulatory complications: Secondary | ICD-10-CM | POA: Diagnosis not present

## 2019-05-06 DIAGNOSIS — Z6835 Body mass index (BMI) 35.0-35.9, adult: Secondary | ICD-10-CM

## 2019-05-06 DIAGNOSIS — E785 Hyperlipidemia, unspecified: Secondary | ICD-10-CM

## 2019-05-06 MED ORDER — ROSUVASTATIN CALCIUM 10 MG PO TABS: ORAL_TABLET | ORAL | 3 refills | Status: DC

## 2019-05-06 MED ORDER — OMEPRAZOLE 20 MG PO CPDR: 20.0000 mg | DELAYED_RELEASE_CAPSULE | Freq: Every day | ORAL | 3 refills | Status: DC

## 2019-05-06 MED ORDER — ALBUTEROL SULFATE HFA 108 (90 BASE) MCG/ACT IN AERS: 2.0000 | INHALATION_SPRAY | Freq: Four times a day (QID) | RESPIRATORY_TRACT | 3 refills | Status: DC | PRN

## 2019-05-06 MED ORDER — FLUTICASONE PROPIONATE 50 MCG/ACT NA SUSP: 2.0000 | Freq: Every day | NASAL | 3 refills | Status: DC

## 2019-05-06 NOTE — Assessment & Plan Note (Signed)
Reviewed health habits including diet and exercise and skin cancer prevention Reviewed appropriate screening tests for age  Also reviewed health mt list, fam hx and immunization status , as well as social and family history   See HPI Labs reviewed  Pt declines breast and colon cancer screening at her age  Planning her flu shot next mo  Rev last dexa -nl  Has advance directive No cognitive concerns Failed hearing test-pt is not ready for audiology ref but thinking about it  Scheduling her vision exam  Safety and fall prev discussed

## 2019-05-06 NOTE — Patient Instructions (Addendum)
Try to get 1200-1500 mg of calcium per day with at least 1000 iu of vitamin D - for bone health   When you are ready for a hearing evaluation with audiology office let us know   Make an appointment for a diabetic eye exam with Dr Zadie Rhine Tell them it is for a diabetic exam (this is needed yearly)   Lets try crestor for cholesterol twice weekly on Monday and Thursday  If any problems please let us know  We will schedule labs in 2-3 months  Keep watching your diet

## 2019-05-06 NOTE — Assessment & Plan Note (Signed)
bp in fair control at this time  BP Readings from Last 1 Encounters:  05/06/19 125/70   No changes needed Most recent labs reviewed  Disc lifstyle change with low sodium diet and exercise

## 2019-05-06 NOTE — Assessment & Plan Note (Signed)
Failed hearing screen  Will call when ready for audiology referral  Disc safety risks of hearing loss

## 2019-05-06 NOTE — Assessment & Plan Note (Signed)
Discussed how this problem influences overall health and the risks it imposes  Reviewed plan for weight loss with lower calorie diet (via better food choices and also portion control or program like weight watchers) and exercise building up to or more than 30 minutes 5 days per week including some aerobic activity    

## 2019-05-06 NOTE — Assessment & Plan Note (Signed)
No clinical changes Trying statin again -low dose twice weekly to start

## 2019-05-06 NOTE — Progress Notes (Signed)
Subjective:    Patient ID: Erin Beck, female    DOB: [birth year], 78 y.o.   MRN: [id number]  HPI Here for amw and health mt exam with rev of chronic medical problems   I have personally reviewed the Medicare Annual Wellness questionnaire and have noted 1. The patient's medical and social history 2. Their use of alcohol, tobacco or illicit drugs 3. Their current medications and supplements 4. The patient's functional ability including ADL's, fall risks, home safety risks and hearing or visual             impairment. 5. Diet and physical activities 6. Evidence for depression or mood disorders  The patients weight, height, BMI have been recorded in the chart and visual acuity is per eye clinic.  I have made referrals, counseling and provided education to the patient based review of the above and I have provided the pt with a written personalized care plan for preventive services. Reviewed and updated provider list, see scanned forms.  See scanned forms.  Routine anticipatory guidance given to patient.  See health maintenance. Colon cancer screening: declines  Breast cancer screening  9/17 mammogram , declines at her age  Self breast exam-no lumps  Flu vaccine- wants to get next month  Tetanus vaccine 04   Postponed for insurance reasons  Pneumovax complete Zoster vaccine   zostavax 10/15 Dexa 11/16 normal BMD Falls-none  Fractures- none  Supplements not taking D or ca  Exercise - walking with walker in and out of the house  Advance directive: has a living will and poa  Cognitive function addressed- see scanned forms- and if abnormal then additional documentation follows.  No memory problems- she takes care of her own affairs /finances Does not get confused at all  Has not been lost  Family watches her   PMH and [place] reviewed  Meds, vitals, and allergies reviewed.   ROS: See HPI.  Otherwise negative.    Pt says she is doing ok overall  Aches and pains  Her R knee is  terrible and it makes her back hurt  Using a walker all the time  Has a chair lift to get up the stairs -that has been great   Husband has had a stroke -doing ok now /had brain surgery  Neither of them drive now  Their sons help them  Does all her own housework   Weight : Wt Readings from Last 3 Encounters:  05/06/19 185 lb (83.9 kg)  07/11/18 185 lb (83.9 kg)  03/18/18 185 lb 12 oz (84.3 kg)  stable  Eating healthy -tries very hard  No sweets  35.54 kg/m   Hearing/vision:  Hearing Screening   '125Hz'$  '250Hz'$  '500Hz'$  '1000Hz'$  '2000Hz'$  '3000Hz'$  '4000Hz'$  '6000Hz'$  '8000Hz'$   Right ear:   0 0 0  0    Left ear:   0 0 0  0    Vision Screening Comments: Unable to do screening due to balance/gait issues she declines a hearing aide or further eval  She hears ok on the phone per pt  Hears the smoke detector  Eye exam 4/19 - struggled with macular problems/ vision    bp is stable today -goes up with pain /exertion  Very good at home  No cp or palpitations or headaches or edema  No side effects to medicines  BP Readings from Last 3 Encounters:  05/06/19 (!) 144/78  07/11/18 140/80  03/18/18 (!) 156/84    Better on 2nd check BP: 125/70  DM2  Sees endocrinology Dr Cruzita Lederer Lab Results  Component Value Date   HGBA1C 6.6 (A) 07/11/2018  per pt doing very well  Has appt scheduled next month  Needs eye exam  Cannot take statins    Hyperlipidemia Lab Results  Component Value Date   CHOL 237 (H) 04/28/2019   CHOL 243 (H) 11/25/2017   CHOL 232 (H) 10/01/2016   Lab Results  Component Value Date   HDL 40.80 04/28/2019   HDL 46.00 11/25/2017   HDL 44.60 10/01/2016   Lab Results  Component Value Date   LDLCALC 171 (H) 11/25/2017   LDLCALC 154 (H) 10/01/2016   LDLCALC 141 (H) 05/16/2016   Lab Results  Component Value Date   TRIG 217.0 (H) 04/28/2019   TRIG 128.0 11/25/2017   TRIG 170.0 (H) 10/01/2016   Lab Results  Component Value Date   CHOLHDL 6 04/28/2019   CHOLHDL 5 11/25/2017    CHOLHDL 5 10/01/2016   Lab Results  Component Value Date   LDLDIRECT 178.0 04/28/2019   LDLDIRECT 154.0 06/10/2015   LDLDIRECT 153.7 06/08/2014  intol of statin and zetia Eating well    Lab Results  Component Value Date   CREATININE 0.76 04/28/2019   BUN 18 04/28/2019   NA 138 04/28/2019   K 4.5 04/28/2019   CL 100 04/28/2019   CO2 28 04/28/2019   Lab Results  Component Value Date   ALT 41 (H) 04/28/2019   AST 27 04/28/2019   ALKPHOS 86 04/28/2019   BILITOT 0.6 04/28/2019    Lab Results  Component Value Date   TSH 1.66 04/28/2019    Lab Results  Component Value Date   WBC 9.3 04/28/2019   HGB 14.4 04/28/2019   HCT 43.1 04/28/2019   MCV 93.8 04/28/2019   PLT 376.0 04/28/2019      Patient Active Problem List   Diagnosis Date Noted   Medicare annual wellness visit, subsequent 05/06/2019   Cerumen impaction 12/13/2017   Facial paresthesia 12/02/2017   Palpitations 04/02/2017   CAD (coronary artery disease), native coronary artery 05/23/2016   Abnormal stress test 12/29/2015   Type 2 diabetes mellitus with circulatory disorder, without long-term current use of insulin ([place]) 06/17/2015   Routine general medical examination at a health care facility 06/14/2015   Hearing loss of aging 06/14/2015   Osteoma of nasal sinus 06/18/2014   Chronic neck and back pain 06/15/2014   Vitreomacular adhesion 05/05/2013   Unspecified asthma(493.90) 04/23/2013   Personal history of colonic polyps 03/03/2013   Post-menopausal 10/17/2012   Obesity 10/31/2011   IBS (irritable bowel syndrome) 08/10/2011   GERD 07/31/2010   Allergic rhinitis 12/05/2007   HYPERTENSION, BENIGN ESSENTIAL 02/05/2007   TOBACCO USE, QUIT 02/05/2007   Hyperlipidemia associated with type 2 diabetes mellitus ([place]) 01/31/2007   Anxiety 01/31/2007   Osteoarthritis 01/31/2007   DIVERTICULITIS, HX OF 01/31/2007   Past Medical History:  Diagnosis Date   Allergic rhinitis     Anxiety    Arthritis    OA   Asthma    CAD (coronary artery disease), native coronary artery 05/23/2016   Non obstructive with 40% mid LAD and 70% ramus   Complication of anesthesia    gas bubble rt eye from an old retinal detachment   Diabetes mellitus    type II   Diverticulitis    Hx of   Full dentures    GERD (gastroesophageal reflux disease)    Hematuria    HX OF MICROSCOPIC BLOOD IN  URINE AND HX OF CYST ON ONE KIDNEY--BUT NO KIDNEY DISEASE   Hyperlipidemia    Hypertension    IBS (irritable bowel syndrome)    Osteopenia    Wears glasses    Past Surgical History:  Procedure Laterality Date   ABDOMINAL HYSTERECTOMY     APPENDECTOMY     per pt   CARDIAC CATHETERIZATION N/A 12/29/2015   Procedure: Left Heart Cath and Coronary Angiography;  Surgeon: Belva Crome, MD;  Location: [facility];  Service: Cardiovascular;  Laterality: N/A;   CARPAL TUNNEL RELEASE     both hands   CHOLECYSTECTOMY     COLONOSCOPY     EYE SURGERY     BILATERAL CATARACTS REMOVED   EYE SURGERY     gas bubble in rt eye from a retinal detachment   JOINT REPLACEMENT  2000   LEFT TOTAL KNEE ARTHROPLASTY   JOINT REPLACEMENT  2013   rt total knee   KNEE ARTHROSCOPY  1990   chondromalacia    TOTAL KNEE ARTHROPLASTY  12/05/2011   Procedure: TOTAL KNEE ARTHROPLASTY;  Surgeon: Magnus Sinning, MD;  Location: [facility];  Service: Orthopedics;  Laterality: Right;   ULNAR NERVE TRANSPOSITION Right 09/15/2014   Procedure: Right cubital tunnel release with transposition of ulnar nerve;  Surgeon: Marianna Payment, MD;  Location: [facility];  Service: Orthopedics;  Laterality: Right;   Social History   Tobacco Use   Smoking status: Former Smoker    Packs/day: 1.00    Years: 48.00    Pack years: 48.00    Quit date: 08/20/2009    Years since quitting: 9.7   Smokeless tobacco: Never Used  Substance Use Topics   Alcohol use: No    Alcohol/week: 0.0 standard  drinks   Drug use: No   Family History  Problem Relation Age of Onset   Pneumonia Mother    Diabetes Father    Cancer Father        of the gallbladder   Heart disease Maternal Grandmother    Heart disease Paternal Grandmother    Diabetes Brother    Allergies  Allergen Reactions   Naproxen Rash    Trouble breathing   Naproxen Sodium Rash    Trouble breathing   Aspirin     REACTION: burns stomach PT STATES SHE DOES TOLERATE THE 81 MG ASPIRIN DAILY   Atorvastatin     REACTION: muscle pain   Buspar [Buspirone Hcl]     Multiple side eff   Gabapentin Other (See Comments)    Palpitations/ dizziness    Metformin And Related     Mm aches, palpitations, SOB, worse eyesight, loss of balance, sweating, low CBGs   Metoprolol Other (See Comments)    dizziness   Norco [Hydrocodone-Acetaminophen] Other (See Comments)    Dizzy and problems remembering    Pravastatin     Leg pain   Simvastatin     REACTION: GI side eff and swelling   Sulfa Antibiotics Other (See Comments)    Severe joint pain   Sulfonamide Derivatives     REACTION: reaction not known   Zetia [Ezetimibe]     Caused dizziness   Current Outpatient Medications on File Prior to Visit  Medication Sig Dispense Refill   ACCU-CHEK FASTCLIX LANCETS MISC TEST FOUR TIMES DAILY AS DIRECTED 200 each 3   acetaminophen (TYLENOL) 325 MG tablet Take 650 mg by mouth every 6 (six) hours as needed (pain).     ALPRAZolam (  XANAX) 0.5 MG tablet Take 0.5-1 tablets (0.25-0.5 mg total) by mouth daily as needed for anxiety or sleep. 30 tablet 0   aspirin 81 MG tablet Take 81 mg by mouth daily.     Blood Glucose Monitoring Suppl (ONETOUCH VERIO) w/Device KIT 1 kit by Does not apply route as directed. 1 kit 0   cetirizine (ZYRTEC) 10 MG tablet Take 10 mg by mouth daily as needed for allergies.     glipiZIDE (GLIPIZIDE XL) 5 MG 24 hr tablet Take 2 tablets (10 mg total) by mouth daily with breakfast. 180 tablet 3    glucose blood (ACCU-CHEK GUIDE) test strip Use as instructed to test once daily 100 each 12   hydrochlorothiazide (HYDRODIURIL) 25 MG tablet TAKE 1 TABLET (25 MG TOTAL) BY MOUTH DAILY. 90 tablet 0   Lancets (ONETOUCH ULTRASOFT) lancets Use as instructed 100 each 12   lisinopril (ZESTRIL) 10 MG tablet TAKE 1 TABLET EVERY DAY 90 tablet 0   nitroGLYCERIN (NITROSTAT) 0.4 MG SL tablet Place 1 tablet (0.4 mg total) under the tongue every 5 (five) minutes as needed for chest pain. 25 tablet 3   PARoxetine (PAXIL) 10 MG tablet Take 0.5 tablets (5 mg total) by mouth daily. 45 tablet 4   No current facility-administered medications on file prior to visit.     Review of Systems  Constitutional: Negative for activity change, appetite change, fatigue, fever and unexpected weight change.  HENT: Negative for congestion, ear pain, rhinorrhea, sinus pressure and sore throat.   Eyes: Negative for pain, redness and visual disturbance.  Respiratory: Negative for cough, shortness of breath and wheezing.   Cardiovascular: Negative for chest pain and palpitations.  Gastrointestinal: Negative for abdominal pain, blood in stool, constipation and diarrhea.  Endocrine: Negative for polydipsia and polyuria.  Genitourinary: Negative for dysuria, frequency and urgency.  Musculoskeletal: Positive for arthralgias, back pain and gait problem. Negative for myalgias.       Severe arthritis R knee -limits her  Will not consider surgery  Skin: Negative for pallor and rash.  Allergic/Immunologic: Negative for environmental allergies.  Neurological: Negative for dizziness, syncope and headaches.  Hematological: Negative for adenopathy. Does not bruise/bleed easily.  Psychiatric/Behavioral: Negative for decreased concentration and dysphoric mood. The patient is not nervous/anxious.        Objective:   Physical Exam Constitutional:      General: She is not in acute distress.    Appearance: Normal appearance. She is  well-developed. She is obese. She is not ill-appearing or diaphoretic.  HENT:     Head: Normocephalic and atraumatic.     Right Ear: Tympanic membrane, ear canal and external ear normal.     Left Ear: Tympanic membrane, ear canal and external ear normal.     Nose: Nose normal. No congestion.     Mouth/Throat:     Mouth: Mucous membranes are moist.     Pharynx: Oropharynx is clear. No posterior oropharyngeal erythema.  Eyes:     General: No scleral icterus.    Extraocular Movements: Extraocular movements intact.     Conjunctiva/sclera: Conjunctivae normal.     Pupils: Pupils are equal, round, and reactive to light.  Neck:     Musculoskeletal: Normal range of motion and neck supple. No neck rigidity or muscular tenderness.     Thyroid: No thyromegaly.     Vascular: No carotid bruit or JVD.  Cardiovascular:     Rate and Rhythm: Normal rate and regular rhythm.     Pulses:  Normal pulses.     Heart sounds: Normal heart sounds. No gallop.   Pulmonary:     Effort: Pulmonary effort is normal. No respiratory distress.     Breath sounds: Normal breath sounds. No wheezing or rales.     Comments: Good air exch Chest:     Chest wall: No tenderness.  Abdominal:     General: Bowel sounds are normal. There is no distension or abdominal bruit.     Palpations: Abdomen is soft. There is no mass.     Tenderness: There is no abdominal tenderness.     Hernia: No hernia is present.  Genitourinary:    Comments: Breast exam: No mass, nodules, thickening, tenderness, bulging, retraction, inflamation, nipple discharge or skin changes noted.  No axillary or clavicular LA.      Exam done in chair Musculoskeletal: Normal range of motion.        General: No tenderness.     Right lower leg: No edema.     Left lower leg: No edema.     Comments: Slow gait due to back and joint pain  Limited rom of R knee Uses walker  Lymphadenopathy:     Cervical: No cervical adenopathy.  Skin:    General: Skin is warm  and dry.     Coloration: Skin is not pale.     Findings: No erythema or rash.     Comments: Skin tags and SKs diffusely  Neurological:     Mental Status: She is alert. Mental status is at baseline.     Cranial Nerves: No cranial nerve deficit.     Motor: No abnormal muscle tone.     Coordination: Coordination normal.     Gait: Gait normal.     Deep Tendon Reflexes: Reflexes are normal and symmetric. Reflexes normal.  Psychiatric:        Mood and Affect: Mood normal.        Cognition and Memory: Cognition and memory normal.     Comments: Mentally sharp           Assessment & Plan:   Problem List Items Addressed This Visit      Cardiovascular and Mediastinum   Type 2 diabetes mellitus with circulatory disorder, without long-term current use of insulin (HCC) (Chronic)    Continues to see Dr Renne Crigler and does well as long as she watches her diet Recent A1C 6.6 Pt will call Dr Dahlia Bailiff office to schedule a diabetic eye exam  We will also try low dose crestor twice weekly (intol to statins in the past)      Relevant Medications   rosuvastatin (CRESTOR) 10 MG tablet   HYPERTENSION, BENIGN ESSENTIAL    bp in fair control at this time  BP Readings from Last 1 Encounters:  05/06/19 125/70   No changes needed Most recent labs reviewed  Disc lifstyle change with low sodium diet and exercise        Relevant Medications   rosuvastatin (CRESTOR) 10 MG tablet     Endocrine   Hyperlipidemia associated with type 2 diabetes mellitus ([place])    Disc goals for lipids and reasons to control them Rev last labs with pt Rev low sat fat diet in detail LDL is up to 178 Pt is statin intol in the past but willing to try low dose crestor twice weekly (poss inc this if she tolerates) inst to call if side eff Check lipid in 2-3 mo with ast/alt Diet is good  Relevant Medications   rosuvastatin (CRESTOR) 10 MG tablet     Nervous and Auditory   Hearing loss of aging    Failed hearing  screen  Will call when ready for audiology referral  Disc safety risks of hearing loss         Other   Obesity    Discussed how this problem influences overall health and the risks it imposes  Reviewed plan for weight loss with lower calorie diet (via better food choices and also portion control or program like weight watchers) and exercise building up to or more than 30 minutes 5 days per week including some aerobic activity         Routine general medical examination at a health care facility    Reviewed health habits including diet and exercise and skin cancer prevention Reviewed appropriate screening tests for age  Also reviewed health mt list, fam hx and immunization status , as well as social and family history   See HPI Labs reviewed  Pt declines breast and colon cancer screening at her age  Planning her flu shot next mo  Rev last dexa -nl  Has advance directive No cognitive concerns Failed hearing test-pt is not ready for audiology ref but thinking about it  Scheduling her vision exam  Safety and fall prev discussed      CAD (coronary artery disease), native coronary artery    No clinical changes Trying statin again -low dose twice weekly to start      Medicare annual wellness visit, subsequent - [place]    Reviewed health habits including diet and exercise and skin cancer prevention Reviewed appropriate screening tests for age  Also reviewed health mt list, fam hx and immunization status , as well as social and family history   See HPI Labs reviewed  Pt declines breast and colon cancer screening at her age  Planning her flu shot next mo  Rev last dexa -nl  Has advance directive No cognitive concerns Failed hearing test-pt is not ready for audiology ref but thinking about it  Scheduling her vision exam  Safety and fall prev discussed

## 2019-05-06 NOTE — Assessment & Plan Note (Signed)
Disc goals for lipids and reasons to control them Rev last labs with pt Rev low sat fat diet in detail LDL is up to 178 Pt is statin intol in the past but willing to try low dose crestor twice weekly (poss inc this if she tolerates) inst to call if side eff Check lipid in 2-3 mo with ast/alt Diet is good

## 2019-05-06 NOTE — Assessment & Plan Note (Signed)
Continues to see Dr Renne Crigler and does well as long as she watches her diet Recent A1C 6.6 Pt will call Dr Dahlia Bailiff office to schedule a diabetic eye exam  We will also try low dose crestor twice weekly (intol to statins in the past)

## 2019-06-04 ENCOUNTER — Ambulatory Visit: Payer: Medicare HMO

## 2019-06-16 ENCOUNTER — Telehealth: Payer: Self-pay | Admitting: Family Medicine

## 2019-06-16 NOTE — Telephone Encounter (Signed)
In your inbox.

## 2019-06-16 NOTE — Telephone Encounter (Signed)
Pt dropped off a parking placard form to be filled out. Placed in [place] tower.

## 2019-06-17 NOTE — Telephone Encounter (Signed)
I did not see it-did I do it already and not remember?

## 2019-06-18 NOTE — Telephone Encounter (Signed)
I found it in my inbox, placed back in your inbox

## 2019-06-18 NOTE — Telephone Encounter (Signed)
Dr. Glori Bickers completed form, called pt twice and line was busy

## 2019-06-19 NOTE — Telephone Encounter (Signed)
Pt notified form ready for pick up 

## 2019-07-02 ENCOUNTER — Telehealth: Payer: Self-pay | Admitting: Family Medicine

## 2019-07-02 DIAGNOSIS — E1169 Type 2 diabetes mellitus with other specified complication: Secondary | ICD-10-CM

## 2019-07-02 DIAGNOSIS — E785 Hyperlipidemia, unspecified: Secondary | ICD-10-CM

## 2019-07-02 NOTE — Telephone Encounter (Signed)
-----   Message from Ellamae Sia sent at 06/29/2019  4:04 PM EST ----- Regarding: Lab orders for Monday, 11.16.20 Lab orders for 2-3 month labs

## 2019-07-06 ENCOUNTER — Other Ambulatory Visit (INDEPENDENT_AMBULATORY_CARE_PROVIDER_SITE_OTHER): Payer: Medicare HMO

## 2019-07-06 ENCOUNTER — Other Ambulatory Visit: Payer: Self-pay

## 2019-07-06 DIAGNOSIS — E1169 Type 2 diabetes mellitus with other specified complication: Secondary | ICD-10-CM

## 2019-07-06 DIAGNOSIS — E785 Hyperlipidemia, unspecified: Secondary | ICD-10-CM | POA: Diagnosis not present

## 2019-07-06 LAB — LIPID PANEL
Cholesterol: 198 mg/dL (ref 0–200)
HDL: 42.5 mg/dL (ref >39.00)
NonHDL: 155.52
Total CHOL/HDL Ratio: 5
Triglycerides: 287 mg/dL — ABNORMAL HIGH (ref 0.0–149.0)
VLDL: 57.4 mg/dL — ABNORMAL HIGH (ref 0.0–40.0)

## 2019-07-06 LAB — AST: AST: 18 U/L (ref 0–37)

## 2019-07-06 LAB — ALT: ALT: 21 U/L (ref 0–35)

## 2019-07-06 LAB — LDL CHOLESTEROL, DIRECT: Direct LDL: 116 mg/dL

## 2019-07-07 ENCOUNTER — Encounter: Payer: Self-pay | Admitting: *Deleted

## 2019-07-14 ENCOUNTER — Other Ambulatory Visit: Payer: Self-pay | Admitting: Family Medicine

## 2019-10-30 ENCOUNTER — Other Ambulatory Visit: Payer: Self-pay | Admitting: Family Medicine

## 2019-12-14 ENCOUNTER — Telehealth: Payer: Self-pay | Admitting: Family Medicine

## 2019-12-14 NOTE — Telephone Encounter (Signed)
Patient called today requesting refills She stated she spoke with [facility] about getting her refills done and that they were no help to her and they pretty much laughed at her. Patient was not sure what she should do but call and see if we can send them over..    rosuvastatin (CRESTOR) 10 MG tablet PARoxetine (PAXIL) 10 MG tablet omeprazole (PRILOSEC) 20 MG capsule hydrochlorothiazide (HYDRODIURIL) 25 MG tablet lisinopril (ZESTRIL) 10 MG tablet glipiZIDE (GLIPIZIDE XL) 5 MG 24 hr tablet    [place], [place]

## 2019-12-14 NOTE — Telephone Encounter (Signed)
Rosuvastatin filled 05/06/19, #90, 3 RF. Omeprazole filled 05/06/19, #90, 3RF Glipizide 01/09/19, #180, 3RF Lisinopril, 07/14/19 #90, 2 RF HCTZ, 07/14/19, #90, 2RF Paxil, 11/02/18, #45, 1RF   Contacted [employer] and spoke with Lovena Le. She reports two of the scripts are ready to be RF but the others are not refillable at this time. There are RF for all of these meds.  She also advised pt can use a local pharmacy but copay may be more. Preferred pharmacies are [place] and [place].   Advised pt of refills and different costs at different pharmacies. Pt reports having difficulty when she spoke with someone last night. Pt has one week of lisinopril left. Advised pt to call during the day and advise that she needs a refill of lisinopril and any other med that is ready for refill. Pt appreciative and wants to tell Dr. Glori Bickers hello. Advised if anything is needed to contact the office. Pt verbalized understanding.

## 2019-12-15 NOTE — Telephone Encounter (Signed)
Pt called to ask about medications. I read her the response from [place], [place]. She will call [employer] to get her lisinopril and whatever else is ready to be sent out to her.

## 2020-01-08 ENCOUNTER — Other Ambulatory Visit: Payer: Self-pay | Admitting: Family Medicine

## 2020-01-11 ENCOUNTER — Other Ambulatory Visit: Payer: Self-pay | Admitting: Internal Medicine

## 2020-02-29 ENCOUNTER — Encounter (INDEPENDENT_AMBULATORY_CARE_PROVIDER_SITE_OTHER): Payer: Medicare HMO | Admitting: Ophthalmology

## 2020-03-28 ENCOUNTER — Ambulatory Visit (INDEPENDENT_AMBULATORY_CARE_PROVIDER_SITE_OTHER): Payer: Medicare HMO | Admitting: Family Medicine

## 2020-03-28 ENCOUNTER — Other Ambulatory Visit: Payer: Self-pay

## 2020-03-28 ENCOUNTER — Encounter: Payer: Self-pay | Admitting: Family Medicine

## 2020-03-28 VITALS — BP 154/82 | HR 89 | Temp 97.0°F | Ht 60.5 in | Wt 176.2 lb

## 2020-03-28 DIAGNOSIS — R82998 Other abnormal findings in urine: Secondary | ICD-10-CM

## 2020-03-28 DIAGNOSIS — E1159 Type 2 diabetes mellitus with other circulatory complications: Secondary | ICD-10-CM | POA: Diagnosis not present

## 2020-03-28 DIAGNOSIS — E6609 Other obesity due to excess calories: Secondary | ICD-10-CM

## 2020-03-28 DIAGNOSIS — E785 Hyperlipidemia, unspecified: Secondary | ICD-10-CM

## 2020-03-28 DIAGNOSIS — E1169 Type 2 diabetes mellitus with other specified complication: Secondary | ICD-10-CM | POA: Diagnosis not present

## 2020-03-28 DIAGNOSIS — Z6833 Body mass index (BMI) 33.0-33.9, adult: Secondary | ICD-10-CM

## 2020-03-28 DIAGNOSIS — R0789 Other chest pain: Secondary | ICD-10-CM | POA: Insufficient documentation

## 2020-03-28 DIAGNOSIS — W108XXA Fall (on) (from) other stairs and steps, initial encounter: Secondary | ICD-10-CM | POA: Diagnosis not present

## 2020-03-28 DIAGNOSIS — R3989 Other symptoms and signs involving the genitourinary system: Secondary | ICD-10-CM | POA: Diagnosis not present

## 2020-03-28 DIAGNOSIS — I25118 Atherosclerotic heart disease of native coronary artery with other forms of angina pectoris: Secondary | ICD-10-CM

## 2020-03-28 LAB — POC URINALSYSI DIPSTICK (AUTOMATED)
Bilirubin, UA: NEGATIVE
Blood, UA: NEGATIVE
Glucose, UA: POSITIVE — AB
Leukocytes, UA: NEGATIVE
Nitrite, UA: NEGATIVE
Protein, UA: NEGATIVE
Spec Grav, UA: <=1.005 — AB (ref 1.010–1.025)
Urobilinogen, UA: 0.2 E.U./dL
pH, UA: 6 (ref 5.0–8.0)

## 2020-03-28 NOTE — Assessment & Plan Note (Signed)
Continues crestor and diet

## 2020-03-28 NOTE — Assessment & Plan Note (Signed)
Noted mild wt loss  Working on diabetic control

## 2020-03-28 NOTE — Assessment & Plan Note (Signed)
S/p fall in June to R side of body  Some tenderness/overall improved -suspect rib contusion or fracture  Recommend ice/heat prn  Update if no further improvement  Also if any sob or cough

## 2020-03-28 NOTE — Progress Notes (Signed)
Subjective:    Patient ID: Erin Beck, female    DOB: 08/10/1941, 79 y.o.   MRN: [id number]  This visit occurred during the SARS-CoV-2 public health emergency.  Safety protocols were in place, including screening questions prior to the visit, additional usage of staff PPE, and extensive cleaning of exam room while observing appropriate contact time as indicated for disinfecting solutions.    HPI Pt presents with urinary symptoms   Wt Readings from Last 3 Encounters:  03/28/20 176 lb 4 oz (79.9 kg)  05/06/19 185 lb (83.9 kg)  07/11/18 185 lb (83.9 kg)   33.85 kg/m   Her urine is very bright yellow -more than usual  Not taking any new vitamins  She is urinating less often  No pain or burning to urinate Has R side pain but that was from a fall  No blood in urine   No bladder pain   No frequency or urgency  She wears a pad-occ incontinence  Ua: Results for orders placed or performed in visit on 03/28/20  POCT Urinalysis Dipstick (Automated)  Result Value Ref Range   Color, UA Light Yellow    Clarity, UA Clear    Glucose, UA Positive (A) Negative   Bilirubin, UA Negative    Ketones, UA Trace    Spec Grav, UA <=1.005 (A) 1.010 - 1.025   Blood, UA Negative    pH, UA 6.0 5.0 - 8.0   Protein, UA Negative Negative   Urobilinogen, UA 0.2 0.2 or 1.0 E.U./dL   Nitrite, UA Negative    Leukocytes, UA Negative Negative     Has not had covid imm yet    She had a fall in a condo while traveling (slick carpet/stairs with no rails)  Fell on R side  Was in [place]  This happened late in June - she recovered well    Stress-husband had a brain operation  Doing a lot better now   Patient Active Problem List   Diagnosis Date Noted  . Urine color appears bright 03/28/2020  . Chest wall pain 03/28/2020  . Fall (on) (from) other stairs and steps, initial encounter 03/28/2020  . Medicare annual wellness visit, subsequent 05/06/2019  . Cerumen impaction 12/13/2017  .  Facial paresthesia 12/02/2017  . Palpitations 04/02/2017  . CAD (coronary artery disease), native coronary artery 05/23/2016  . Abnormal stress test 12/29/2015  . Type 2 diabetes mellitus with circulatory disorder, without long-term current use of insulin ([place]) 06/17/2015  . Routine general medical examination at a health care facility 06/14/2015  . Hearing loss of aging 06/14/2015  . 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 Chronic neck and back pain 06/15/2014  . Vitreomacular adhesion 05/05/2013  . Unspecified asthma(493.90) 04/23/2013  . Personal history of colonic polyps 03/03/2013  . Post-menopausal 10/17/2012  . Obesity 10/31/2011  . IBS (irritable bowel syndrome) 08/10/2011  . GERD 07/31/2010  . Allergic rhinitis 12/05/2007  . HYPERTENSION, BENIGN ESSENTIAL 02/05/2007  . TOBACCO USE, QUIT 02/05/2007  . Hyperlipidemia associated with type 2 diabetes mellitus ([place]) 01/31/2007  . Anxiety 01/31/2007  . Osteoarthritis 01/31/2007  . DIVERTICULITIS, HX OF 01/31/2007   Past Medical History:  Diagnosis Date  . Allergic rhinitis   . Anxiety   . Arthritis    OA  . Asthma   . CAD (coronary artery disease), native coronary artery 05/23/2016   Non obstructive with 40% mid LAD and 60% ramus  . Complication of anesthesia    gas bubble rt eye  from an old retinal detachment  . Diabetes mellitus    type II  . Diverticulitis    Hx of  . Full dentures   . GERD (gastroesophageal reflux disease)   . Hematuria    HX OF MICROSCOPIC BLOOD IN URINE AND HX OF CYST ON ONE KIDNEY--BUT NO KIDNEY DISEASE  . Hyperlipidemia   . Hypertension   . IBS (irritable bowel syndrome)   . Osteopenia   . Wears glasses    Past Surgical History:  Procedure Laterality Date  . ABDOMINAL HYSTERECTOMY    . APPENDECTOMY     per pt  . CARDIAC CATHETERIZATION N/A 12/29/2015   Procedure: Left Heart Cath and Coronary Angiography;  Surgeon: Belva Crome, MD;  Location: [facility];  Service: Cardiovascular;   Laterality: N/A;  . CARPAL TUNNEL RELEASE     both hands  . CHOLECYSTECTOMY    . COLONOSCOPY    . EYE SURGERY     BILATERAL CATARACTS REMOVED  . EYE SURGERY     gas bubble in rt eye from a retinal detachment  . JOINT REPLACEMENT  2000   LEFT TOTAL KNEE ARTHROPLASTY  . JOINT REPLACEMENT  2013   rt total knee  . KNEE ARTHROSCOPY  1990   chondromalacia   . TOTAL KNEE ARTHROPLASTY  12/05/2011   Procedure: TOTAL KNEE ARTHROPLASTY;  Surgeon: Magnus Sinning, MD;  Location: [facility];  Service: Orthopedics;  Laterality: Right;  . ULNAR NERVE TRANSPOSITION Right 09/15/2014   Procedure: Right cubital tunnel release with transposition of ulnar nerve;  Surgeon: Marianna Payment, MD;  Location: [facility];  Service: Orthopedics;  Laterality: Right;   Social History   Tobacco Use  . Smoking status: Former Smoker    Packs/day: 1.00    Years: 48.00    Pack years: 48.00    Quit date: 08/20/2009    Years since quitting: 10.6  . Smokeless tobacco: Never Used  Substance Use Topics  . Alcohol use: No    Alcohol/week: 0.0 standard drinks  . Drug use: No   Family History  Problem Relation Age of Onset  . Pneumonia Mother   . Diabetes Father   . Cancer Father        of the gallbladder  . Heart disease Maternal Grandmother   . Heart disease Paternal Grandmother   . Diabetes Brother    Allergies  Allergen Reactions  . Naproxen Rash    Trouble breathing  . Naproxen Sodium Rash    Trouble breathing  . Aspirin     REACTION: burns stomach PT STATES SHE DOES TOLERATE THE 81 MG ASPIRIN DAILY  . Atorvastatin     REACTION: muscle pain  . Buspar [Buspirone Hcl]     Multiple side eff  . Gabapentin Other (See Comments)    Palpitations/ dizziness   . Metformin And Related     Mm aches, palpitations, SOB, worse eyesight, loss of balance, sweating, low CBGs  . Metoprolol Other (See Comments)    dizziness  . Norco [Hydrocodone-Acetaminophen] Other (See Comments)    Dizzy and  problems remembering   . Pravastatin     Leg pain  . Simvastatin     REACTION: GI side eff and swelling  . Sulfa Antibiotics Other (See Comments)    Severe joint pain  . Sulfonamide Derivatives     REACTION: reaction not known  . Zetia [Ezetimibe]     Caused dizziness   Current Outpatient Medications on File Prior  to Visit  Medication Sig Dispense Refill  . ACCU-CHEK FASTCLIX LANCETS MISC TEST FOUR TIMES DAILY AS DIRECTED 200 each 3  . acetaminophen (TYLENOL) 325 MG tablet Take 650 mg by mouth every 6 (six) hours as needed (pain).    Marland Kitchen albuterol (VENTOLIN HFA) 108 (90 Base) MCG/ACT inhaler Inhale 2 puffs into the lungs every 6 (six) hours as needed for wheezing or shortness of breath. Reported on 12/30/2015 18 g 3  . ALPRAZolam (XANAX) 0.5 MG tablet Take 0.5-1 tablets (0.25-0.5 mg total) by mouth daily as needed for anxiety or sleep. 30 tablet 0  . aspirin 81 MG tablet Take 81 mg by mouth daily.    . Blood Glucose Monitoring Suppl (ONETOUCH VERIO) w/Device KIT 1 kit by Does not apply route as directed. 1 kit 0  . cetirizine (ZYRTEC) 10 MG tablet Take 10 mg by mouth daily as needed for allergies.    . fluticasone (FLONASE) 50 MCG/ACT nasal spray Place 2 sprays into both nostrils daily. 48 g 3  . glipiZIDE (GLUCOTROL XL) 5 MG 24 hr tablet TAKE 2 TABLETS EVERY DAY WITH BREAKFAST 180 tablet 0  . glucose blood (ACCU-CHEK GUIDE) test strip Use as instructed to test once daily 100 each 12  . hydrochlorothiazide (HYDRODIURIL) 25 MG tablet TAKE 1 TABLET EVERY DAY 90 tablet 1  . Lancets (ONETOUCH ULTRASOFT) lancets Use as instructed 100 each 12  . lisinopril (ZESTRIL) 10 MG tablet TAKE 1 TABLET EVERY DAY 90 tablet 2  . nitroGLYCERIN (NITROSTAT) 0.4 MG SL tablet Place 1 tablet (0.4 mg total) under the tongue every 5 (five) minutes as needed for chest pain. 25 tablet 3  . omeprazole (PRILOSEC) 20 MG capsule Take 1 capsule (20 mg total) by mouth daily. 90 capsule 3  . PARoxetine (PAXIL) 10 MG tablet  TAKE 1/2 TABLET EVERY DAY 45 tablet 1  . rosuvastatin (CRESTOR) 10 MG tablet TAKE ONE TABLET ON MONDAY AND THURSDAY 24 tablet 1   No current facility-administered medications on file prior to visit.     Review of Systems  Constitutional: Negative for activity change, appetite change, fatigue, fever and unexpected weight change.  HENT: Negative for congestion, ear pain, rhinorrhea, sinus pressure and sore throat.   Eyes: Negative for pain, redness and visual disturbance.  Respiratory: Negative for cough, shortness of breath and wheezing.        Rib pain on the R  Cardiovascular: Negative for chest pain and palpitations.  Gastrointestinal: Negative for abdominal pain, blood in stool, constipation and diarrhea.  Endocrine: Negative for polydipsia and polyuria.  Genitourinary: Negative for dysuria, frequency and urgency.       Urine is a brighter color  Musculoskeletal: Negative for arthralgias, back pain and myalgias.  Skin: Negative for pallor and rash.  Allergic/Immunologic: Negative for environmental allergies.  Neurological: Negative for dizziness, syncope and headaches.  Hematological: Negative for adenopathy. Does not bruise/bleed easily.  Psychiatric/Behavioral: Negative for decreased concentration and dysphoric mood. The patient is not nervous/anxious.        Objective:   Physical Exam Constitutional:      General: She is not in acute distress.    Appearance: Normal appearance. She is well-developed. She is obese. She is not ill-appearing.  HENT:     Head: Normocephalic and atraumatic.     Mouth/Throat:     Mouth: Mucous membranes are moist.  Eyes:     General: No scleral icterus.    Conjunctiva/sclera: Conjunctivae normal.     Pupils: Pupils are  equal, round, and reactive to light.  Neck:     Thyroid: No thyromegaly.     Vascular: No carotid bruit or JVD.  Cardiovascular:     Rate and Rhythm: Normal rate and regular rhythm.     Heart sounds: Normal heart sounds. No  gallop.   Pulmonary:     Effort: Pulmonary effort is normal. No respiratory distress.     Breath sounds: Normal breath sounds. No wheezing or rales.     Comments: R lower anterolateral ribs are tender No step off or crepitus noted  Nl breath sounds throughout  Chest:     Chest wall: Tenderness present.  Abdominal:     General: Bowel sounds are normal. There is no distension or abdominal bruit.     Palpations: Abdomen is soft. There is no mass.     Tenderness: There is no abdominal tenderness. There is no right CVA tenderness, left CVA tenderness, guarding or rebound.     Comments: No suprapubic tenderness or fullness    Musculoskeletal:     Cervical back: Normal range of motion and neck supple.     Right lower leg: No edema.     Left lower leg: No edema.     Comments: Mild kyphosis   Lymphadenopathy:     Cervical: No cervical adenopathy.  Skin:    General: Skin is warm and dry.     Coloration: Skin is not pale.     Findings: No erythema or rash.  Neurological:     Mental Status: She is alert.     Deep Tendon Reflexes: Reflexes are normal and symmetric.  Psychiatric:        Mood and Affect: Mood normal.           Assessment & Plan:   Problem List Items Addressed This Visit      Cardiovascular and Mediastinum   Type 2 diabetes mellitus with circulatory disorder, without long-term current use of insulin (HCC) (Chronic)    Continues care of endocrinology      Atherosclerotic heart disease of native coronary artery with other forms of angina pectoris (HCC)    No clinical changes         Endocrine   Hyperlipidemia associated with type 2 diabetes mellitus ([place])    Continues crestor and diet         Other   Class 1 obesity without serious comorbidity with body mass index (BMI) of 33.0 to 33.9 in adult    Noted mild wt loss  Working on diabetic control       Urine color appears bright - Primary    Suspect this was due to need for more fluids (pt could not give  sample in office- went home and able to void with big water intake ) ua is reassuring- some glucose/diabetic  Enc to make effort to drink more fluids       Chest wall pain    S/p fall in June to R side of body  Some tenderness/overall improved -suspect rib contusion or fracture  Recommend ice/heat prn  Update if no further improvement  Also if any sob or cough      Fall (on) (from) other stairs and steps, initial encounter    In June-fall on stairs (no railing) onto R side  Disc fall prevention in detail  Chest wall pain is improving and gait is at baseline        Other Visit Diagnoses    Abnormal urine color  Relevant Orders   POCT Urinalysis Dipstick (Automated) (Completed)

## 2020-03-28 NOTE — Assessment & Plan Note (Signed)
No clinical changes 

## 2020-03-28 NOTE — Assessment & Plan Note (Addendum)
Suspect this was due to need for more fluids (pt could not give sample in office- went home and able to void with big water intake ) ua is reassuring- some glucose/diabetic  Enc to make effort to drink more fluids

## 2020-03-28 NOTE — Patient Instructions (Addendum)
°  please go ahead and get vaccinated for covid   Drink more fluid  Aim for 64 oz of fluid daily (mostly water)  I think you may be dehydrated   I think you injured a rib/ribs with your fall As long as pain continues to improve -that is ok  If you develop a cough or shortness of breath please call   COVID-19 Vaccine Information can be found at: ShippingScam.co.uk For questions related to vaccine distribution or appointments, please email vaccine@[facility] .com or call [phone].

## 2020-03-28 NOTE — Assessment & Plan Note (Signed)
Continues care of endocrinology

## 2020-03-28 NOTE — Assessment & Plan Note (Signed)
In June-fall on stairs (no railing) onto R side  Disc fall prevention in detail  Chest wall pain is improving and gait is at baseline

## 2020-04-12 ENCOUNTER — Other Ambulatory Visit: Payer: Self-pay

## 2020-04-12 ENCOUNTER — Ambulatory Visit (INDEPENDENT_AMBULATORY_CARE_PROVIDER_SITE_OTHER): Payer: Medicare HMO | Admitting: Family Medicine

## 2020-04-12 ENCOUNTER — Ambulatory Visit (INDEPENDENT_AMBULATORY_CARE_PROVIDER_SITE_OTHER)
Admission: RE | Admit: 2020-04-12 | Discharge: 2020-04-12 | Disposition: A | Discharge status: Home / Self Care | Payer: Medicare HMO | Source: Ambulatory Visit | Attending: Family Medicine | Admitting: Family Medicine

## 2020-04-12 ENCOUNTER — Encounter: Payer: Self-pay | Admitting: Family Medicine

## 2020-04-12 ENCOUNTER — Telehealth: Payer: Self-pay | Admitting: Family Medicine

## 2020-04-12 VITALS — BP 130/84 | HR 68 | Temp 98.6°F | Wt 177.0 lb

## 2020-04-12 DIAGNOSIS — M25551 Pain in right hip: Secondary | ICD-10-CM | POA: Diagnosis not present

## 2020-04-12 DIAGNOSIS — M1611 Unilateral primary osteoarthritis, right hip: Secondary | ICD-10-CM | POA: Diagnosis not present

## 2020-04-12 NOTE — Telephone Encounter (Signed)
Access nurse called back and said that the patient doesn't have a fever and the patient said he temperature was up only because of her having to climb up and down stairs. She gets hot but she checked her temperature and she doesn't have fever. Access Nurse wanted her to be seen in the office within 4 hours. I let them know the only person that had an appointment today was Dr. Einar Pheasant at 4pm. Pt was schedule with Dr. Einar Pheasant with neg covid screen.

## 2020-04-12 NOTE — Telephone Encounter (Signed)
Pt called in and said she fell back in June and she is having severe back and leg pain that is hurting down the right side. Pt said she thinks she is running a fever. Sent patient to access nurse.

## 2020-04-12 NOTE — Telephone Encounter (Signed)
[facility] - Client TELEPHONE ADVICE RECORD AccessNurse Patient Name: Erin Beck Gender: Female DOB: [birth year] Age: 79 Y 11 M 19 D Return Phone Number: [phone] (Primary) [place] [address] Client [facility] - Client Client Site [place] MD Contact Type Call Who Is Calling Patient / Member / Family / Caregiver Call Type Triage / Clinical Relationship To Patient Self Return Phone Number [phone] (Primary) Chief Complaint Flank Pain Reason for Call Symptomatic / Request for Health Information Initial Comment Having back, side and leg pain going down her right side. Fell in June and still having pain from where she hit. Translation No Nurse Assessment Nurse: Rock Nephew, RN, Juliann Pulse Date/Time (Eastern Time): 04/12/2020 9:23:25 AM Confirm and document reason for call. If symptomatic, describe symptoms. ---Caller stated she has abdominal pain with swelling and back pain ( radiating down right leg) . She fell in June and has never really gotten rid of the pain . Has the patient had close contact with a person known or suspected to have the novel coronavirus illness OR traveled / lives in area with major community spread (including international travel) in the last 14 days from the onset of symptoms? * If Asymptomatic, screen for exposure and travel within the last 14 days. ---No Does the patient have any new or worsening symptoms? ---Yes Will a triage be completed? ---Yes Related visit to physician within the last 2 weeks? ---No Does the PT have any chronic conditions? (i.e. diabetes, asthma, this includes High risk factors for pregnancy, etc.) ---Yes List chronic conditions. ---Diabetes Is this a behavioral health or substance abuse call? ---No Guidelines Guideline Title Affirmed Question Affirmed Notes Nurse Date/Time  (Eastern Time) Abdominal Pain - Female [1] MILD-MODERATE pain AND [2] constant AND [3] present > 2 hours Rock Nephew, RN, Juliann Pulse 04/12/2020 9:25:13 AM PLEASE NOTE: All timestamps contained within this report are represented as [place] Standard Time. CONFIDENTIALTY NOTICE: This fax transmission is intended only for the addressee. It contains information that is legally privileged, confidential or otherwise protected from use or disclosure. If you are not the intended recipient, you are strictly prohibited from reviewing, disclosing, copying using or disseminating any of this information or taking any action in reliance on or regarding this information. If you have received this fax in error, please notify us immediately by telephone so that we can arrange for its return to [place]. Phone: [phone], Toll-Free: [phone], Fax: [phone] Page: 2 of 2 Call Id: [id number] [place]. Time Eilene Ghazi Time) Disposition Final User 04/12/2020 9:18:31 AM Send To RN Personal Markus Daft, RN, Sherre Poot 04/12/2020 9:28:09 AM See HCP within 4 Hours (or PCP triage) Yes Rock Nephew, RN, Gara Kroner Disagree/Comply Comply Caller Understands Yes PreDisposition Call Doctor Care Advice Given Per Guideline SEE HCP WITHIN 4 HOURS (OR PCP TRIAGE): * IF OFFICE WILL BE OPEN: You need to be seen within the next 3 or 4 hours. Call your doctor (or NP/PA) now or as soon as the office opens. REST: * Lie down and rest. * Do this until seen. NOTHING BY MOUTH: * Do not eat or drink anything for now. CALL BACK IF: * You become worse. CARE ADVICE given per Abdominal Pain, Female (Adult) guideline. Comments User: Valetta Mole, RN Date/Time Eilene Ghazi Time): 04/12/2020 9:38:53 AM Appt scheduled for patient for 4pm today at this office. She understands this is outside the 4 hour recommendation but does not want to go to [facility]  Referrals Warm transfer to backline

## 2020-04-12 NOTE — Assessment & Plan Note (Signed)
Following fall, however, exam and XR concerning for arthritis. Advised tumeric daily given hx and to use voltaren gel and lidocaine patches. Follow-up final XR. HH PT for treatment. Return to see dr. Lorelei Pont for possible injection if not improving.

## 2020-04-12 NOTE — Telephone Encounter (Signed)
If she is running a temp= needs to be seen at [facility] or ER

## 2020-04-12 NOTE — Telephone Encounter (Signed)
Per appt notes pt has appt today at 4PM with Dr Einar Pheasant.

## 2020-04-12 NOTE — Patient Instructions (Signed)
Curcumin or Tumeric  Research has shown that Curcumin (Tumeric) supplements are just as good as high dose anti-inflammatory medications (like diclofenac and ibuprofen) at treating pain from osteoarthritis without the side effects.   Take Curcumin 500 mg Three times daily   -- You can find a bottle at [employer] for $8 which should last a month -- Erin Beck is around $9 and is available at [facility]   For your hip pain - continue tylenol - try Voltaren gel at the pharmacy - try lidocaine patches at the pharmacy  If not improving in 2 weeks with pain and therapy -- Dr. Lorelei Pont for possible injection if not improved

## 2020-04-12 NOTE — Progress Notes (Addendum)
Subjective:     Erin Beck is a 79 y.o. female presenting for Injury (Pt stated --right joint/hip is painful--fell down from the bench--longer than 2 months)     HPI  #Right hip side - fall in end of June - was in her timeshare and had come into the house - no railing at the entrance and go down a step and slipped and fell on the right side - pain that radiates into the groin and leg hip - also leg and back pain - taking tylenol for pain relief w/ some improvement - not sure if ambulation has changed - pain on the lateral hip   Review of Systems   Social History   Tobacco Use  Smoking Status Former Smoker  . Packs/day: 1.00  . Years: 48.00  . Pack years: 48.00  . Quit date: 08/20/2009  . Years since quitting: 10.6  Smokeless Tobacco Never Used        Objective:    BP Readings from Last 3 Encounters:  04/12/20 130/84  03/28/20 (!) 154/82  05/06/19 125/70   Wt Readings from Last 3 Encounters:  04/12/20 177 lb (80.3 kg)  03/28/20 176 lb 4 oz (79.9 kg)  05/06/19 185 lb (83.9 kg)    BP 130/84   Pulse 68   Temp 98.6 F (37 C)   Wt 177 lb (80.3 kg)   SpO2 97%   BMI 34.00 kg/m    Physical Exam Constitutional:      General: She is not in acute distress.    Appearance: She is well-developed. She is not diaphoretic.     Comments: Ambulating with walker  HENT:     Right Ear: External ear normal.     Left Ear: External ear normal.  Eyes:     Conjunctiva/sclera: Conjunctivae normal.  Cardiovascular:     Rate and Rhythm: Normal rate.  Pulmonary:     Effort: Pulmonary effort is normal.  Musculoskeletal:     Cervical back: Neck supple.     Comments: Back: Inspection: no swelling Palpation: TTP along the midline and lateral lumbar area ROM: able to ambulate and get up and down from the exam table, but not directly examined Strength: normal  Hip:  Inspection: even sides Palpation: very tender along the lateral hip and anterior hip with pain  radiating the the lateral and groin ROM: extremely limited external and internal rotation Strength: hip flexors normal, other deferred to pain FABER - difficulty do to pain - no radiation to the groin but severe lateral pain  Skin:    General: Skin is warm and dry.     Capillary Refill: Capillary refill takes less than 2 seconds.  Neurological:     Mental Status: She is alert. Mental status is at baseline.  Psychiatric:        Mood and Affect: Mood normal.        Behavior: Behavior normal.      XR: appears to have signs of arthritis and joint space narrowing     Assessment & Plan:   Problem List Items Addressed This Visit      Musculoskeletal and Integument   Osteoarthritis   Relevant Orders   DG Hip Unilat W OR W/O Pelvis 2-3 Views Right (Completed)   Ambulatory referral to [facility]     Other   Right hip pain - Primary    Following fall, however, exam and XR concerning for arthritis. Advised tumeric daily given hx and to use  voltaren gel and lidocaine patches. Follow-up final XR. HH PT for treatment. Return to see dr. Lorelei Pont for possible injection if not improving.       Relevant Orders   DG Hip Unilat W OR W/O Pelvis 2-3 Views Right (Completed)   Ambulatory referral to [facility]     Final read consistent with arthritis   Return in about 6 weeks (around 05/24/2020), or if symptoms worsen or fail to improve.  Lesleigh Noe, MD  This visit occurred during the SARS-CoV-2 public health emergency.  Safety protocols were in place, including screening questions prior to the visit, additional usage of staff PPE, and extensive cleaning of exam room while observing appropriate contact time as indicated for disinfecting solutions.

## 2020-04-13 ENCOUNTER — Telehealth: Payer: Self-pay | Admitting: Family Medicine

## 2020-04-13 ENCOUNTER — Other Ambulatory Visit: Payer: Self-pay | Admitting: Family Medicine

## 2020-04-13 ENCOUNTER — Other Ambulatory Visit: Payer: Self-pay | Admitting: Internal Medicine

## 2020-04-13 NOTE — Telephone Encounter (Signed)
Pt called checking on rx's that were supposed to be sent in yesterday.  She stated the xray tech told her they were calling in her 2 rx.  One was a cream for her back where it hurts so bad. She didn't know what the other rx was.  [facility] [address]  Please advise pt

## 2020-04-13 NOTE — Telephone Encounter (Signed)
Pt saw Dr. Einar Pheasant not Dr. Glori Bickers, will route to Dr. Einar Pheasant for review

## 2020-04-13 NOTE — Telephone Encounter (Signed)
See encounter from 8/24. 

## 2020-04-13 NOTE — Telephone Encounter (Signed)
Per AVS - patient was advised to try over the counter treatment  -- Tumeric (as below is a pill) -- Volteran Gel (topical for back/hip) -- is OTC -- Can also do OTC Lidocaine patches if needed  No prescription medications.    Curcumin or Tumeric  Research has shown that Curcumin (Tumeric) supplements are just as good as high dose anti-inflammatory medications (like diclofenac and ibuprofen) at treating pain from osteoarthritis without the side effects.   Take Curcumin 500 mg Three times daily   -- You can find a bottle at [employer] for $8 which should last a month -- [place] is around $9 and is available at [facility]   For your hip pain - continue tylenol - try Voltaren gel at the pharmacy - try lidocaine patches at the pharmacy

## 2020-04-14 NOTE — Telephone Encounter (Signed)
Spoke to pt and relayed information to her per Dr. Einar Pheasant. She wrote the info down while we talked and she will ask her son to get the turmeric for her.  I also relayed her hip x-ray results to her, per Dr. Einar Pheasant and advised her to let us know if she doesn't get any relief from the OTC treatment, so she can see Dr. Lorelei Pont for an injection.  Pt stated understanding.

## 2020-04-15 ENCOUNTER — Telehealth: Payer: Self-pay | Admitting: Family Medicine

## 2020-04-15 NOTE — Telephone Encounter (Signed)
Patient called and said her insurance wanted patient to change pharmacies. Patient said she wants to stay with [facility].

## 2020-04-16 DIAGNOSIS — Z9181 History of falling: Secondary | ICD-10-CM | POA: Diagnosis not present

## 2020-04-16 DIAGNOSIS — M1611 Unilateral primary osteoarthritis, right hip: Secondary | ICD-10-CM | POA: Diagnosis not present

## 2020-04-16 DIAGNOSIS — E119 Type 2 diabetes mellitus without complications: Secondary | ICD-10-CM | POA: Diagnosis not present

## 2020-04-16 DIAGNOSIS — M25551 Pain in right hip: Secondary | ICD-10-CM | POA: Diagnosis not present

## 2020-04-16 DIAGNOSIS — Z7984 Long term (current) use of oral hypoglycemic drugs: Secondary | ICD-10-CM | POA: Diagnosis not present

## 2020-04-16 DIAGNOSIS — R262 Difficulty in walking, not elsewhere classified: Secondary | ICD-10-CM | POA: Diagnosis not present

## 2020-04-16 DIAGNOSIS — Z87891 Personal history of nicotine dependence: Secondary | ICD-10-CM | POA: Diagnosis not present

## 2020-04-18 ENCOUNTER — Telehealth: Payer: Self-pay | Admitting: *Deleted

## 2020-04-18 NOTE — Telephone Encounter (Signed)
Please ok that verbal order and keep me posted

## 2020-04-18 NOTE — Telephone Encounter (Signed)
Verbal order given to [place]

## 2020-04-18 NOTE — Telephone Encounter (Signed)
Erin Beck PT left a voicemail stating that they initiated care yesterday.for PT. Erin Beck stated that patient advised her that somehow her insurance got cancelled. Erin Beck stated that she would like orders times one for yesterday. Erin Beck stated that they will go ahead and put PT on hold until the patient gets her insurance straightened out. Erin Beck requested a call back for approval for yesterday's visit. Okay to leave verbal order on voicemail because it is a secured line.

## 2020-04-18 NOTE — Telephone Encounter (Signed)
Called pt and advise her she will have to f/u with her insurance company. We can send her refills to whatever pharmacy she wants [place] to but if her insurance is saying she has to change pharmacies then they might not pay for her medications if she uses the wrong pharmacy. Pt advise to f/u with insurance company and whatever pharmacy they are telling her to use to let us know and we can send Rxs there if she needs [place] to

## 2020-04-18 NOTE — Telephone Encounter (Signed)
Please ok those orders  Thanks for letting me know

## 2020-04-18 NOTE — Telephone Encounter (Signed)
Erin Beck called back and left a voicemail stating that the patient called her insurance company and her insurance has not been canceled. Erin Beck stated that she would like to go back to the original order of PT one time a week for one week, two times a week for 4 weeks. Erin Beck stated that they will be focusing on range of motion, strengthening, gait, balance training, fall prevention  and home safety and anything else that Dr. Glori Bickers would like for them to concentrate on.

## 2020-04-20 ENCOUNTER — Telehealth: Payer: Self-pay

## 2020-04-20 DIAGNOSIS — R262 Difficulty in walking, not elsewhere classified: Secondary | ICD-10-CM | POA: Diagnosis not present

## 2020-04-20 DIAGNOSIS — Z7984 Long term (current) use of oral hypoglycemic drugs: Secondary | ICD-10-CM | POA: Diagnosis not present

## 2020-04-20 DIAGNOSIS — M1611 Unilateral primary osteoarthritis, right hip: Secondary | ICD-10-CM | POA: Diagnosis not present

## 2020-04-20 DIAGNOSIS — M25551 Pain in right hip: Secondary | ICD-10-CM | POA: Diagnosis not present

## 2020-04-20 DIAGNOSIS — Z9181 History of falling: Secondary | ICD-10-CM | POA: Diagnosis not present

## 2020-04-20 DIAGNOSIS — Z87891 Personal history of nicotine dependence: Secondary | ICD-10-CM | POA: Diagnosis not present

## 2020-04-20 DIAGNOSIS — E119 Type 2 diabetes mellitus without complications: Secondary | ICD-10-CM | POA: Diagnosis not present

## 2020-04-20 NOTE — Telephone Encounter (Signed)
I spoke with pt; pt did not have contact # or remember agency that Charlotte Sanes PT was with; from previous phone note gave pt Charlotte Sanes PT with [facility] [phone]. Pt will contact [name] and nothing further needed.

## 2020-04-20 NOTE — Telephone Encounter (Signed)
[place] Night - Client Nonclinical Telephone Record AccessNurse Client [place] Night - Client Client Site [place] Physician Tower, Roque Lias - MD Contact Type Call Who Is Calling Patient / Member / Family / Caregiver Caller Name [place] Phone Number [phone] Patient Name Erin Beck Patient DOB 1941/06/10 Call Type Message Only Information Provided Reason for Call Request for General Office Information Initial Comment Caller states she was expecting a call from [facility] at 500. Additional Comment Provided office hours. Disp. Time Disposition Final User 04/19/2020 6:09:40 PM General Information Provided Yes Rica Mote Call Closed By: Rica Mote Transaction Date/Time: 04/19/2020 6:06:40 PM (ET)

## 2020-04-22 DIAGNOSIS — M25551 Pain in right hip: Secondary | ICD-10-CM | POA: Diagnosis not present

## 2020-04-22 DIAGNOSIS — E119 Type 2 diabetes mellitus without complications: Secondary | ICD-10-CM | POA: Diagnosis not present

## 2020-04-22 DIAGNOSIS — Z9181 History of falling: Secondary | ICD-10-CM | POA: Diagnosis not present

## 2020-04-22 DIAGNOSIS — Z7984 Long term (current) use of oral hypoglycemic drugs: Secondary | ICD-10-CM | POA: Diagnosis not present

## 2020-04-22 DIAGNOSIS — R262 Difficulty in walking, not elsewhere classified: Secondary | ICD-10-CM | POA: Diagnosis not present

## 2020-04-22 DIAGNOSIS — Z87891 Personal history of nicotine dependence: Secondary | ICD-10-CM | POA: Diagnosis not present

## 2020-04-22 DIAGNOSIS — M1611 Unilateral primary osteoarthritis, right hip: Secondary | ICD-10-CM | POA: Diagnosis not present

## 2020-04-26 DIAGNOSIS — Z7984 Long term (current) use of oral hypoglycemic drugs: Secondary | ICD-10-CM | POA: Diagnosis not present

## 2020-04-26 DIAGNOSIS — R262 Difficulty in walking, not elsewhere classified: Secondary | ICD-10-CM | POA: Diagnosis not present

## 2020-04-26 DIAGNOSIS — M1611 Unilateral primary osteoarthritis, right hip: Secondary | ICD-10-CM | POA: Diagnosis not present

## 2020-04-26 DIAGNOSIS — M25551 Pain in right hip: Secondary | ICD-10-CM | POA: Diagnosis not present

## 2020-04-26 DIAGNOSIS — E119 Type 2 diabetes mellitus without complications: Secondary | ICD-10-CM | POA: Diagnosis not present

## 2020-04-26 DIAGNOSIS — Z87891 Personal history of nicotine dependence: Secondary | ICD-10-CM | POA: Diagnosis not present

## 2020-04-26 DIAGNOSIS — Z9181 History of falling: Secondary | ICD-10-CM | POA: Diagnosis not present

## 2020-04-27 ENCOUNTER — Other Ambulatory Visit: Payer: Self-pay | Admitting: Family Medicine

## 2020-04-28 DIAGNOSIS — Z9181 History of falling: Secondary | ICD-10-CM | POA: Diagnosis not present

## 2020-04-28 DIAGNOSIS — M25551 Pain in right hip: Secondary | ICD-10-CM | POA: Diagnosis not present

## 2020-04-28 DIAGNOSIS — E119 Type 2 diabetes mellitus without complications: Secondary | ICD-10-CM | POA: Diagnosis not present

## 2020-04-28 DIAGNOSIS — Z7984 Long term (current) use of oral hypoglycemic drugs: Secondary | ICD-10-CM | POA: Diagnosis not present

## 2020-04-28 DIAGNOSIS — Z87891 Personal history of nicotine dependence: Secondary | ICD-10-CM | POA: Diagnosis not present

## 2020-04-28 DIAGNOSIS — R262 Difficulty in walking, not elsewhere classified: Secondary | ICD-10-CM | POA: Diagnosis not present

## 2020-04-28 DIAGNOSIS — M1611 Unilateral primary osteoarthritis, right hip: Secondary | ICD-10-CM | POA: Diagnosis not present

## 2020-05-02 ENCOUNTER — Telehealth: Payer: Self-pay | Admitting: *Deleted

## 2020-05-02 DIAGNOSIS — R262 Difficulty in walking, not elsewhere classified: Secondary | ICD-10-CM | POA: Diagnosis not present

## 2020-05-02 DIAGNOSIS — Z7984 Long term (current) use of oral hypoglycemic drugs: Secondary | ICD-10-CM | POA: Diagnosis not present

## 2020-05-02 DIAGNOSIS — M1611 Unilateral primary osteoarthritis, right hip: Secondary | ICD-10-CM | POA: Diagnosis not present

## 2020-05-02 DIAGNOSIS — Z9181 History of falling: Secondary | ICD-10-CM | POA: Diagnosis not present

## 2020-05-02 DIAGNOSIS — Z87891 Personal history of nicotine dependence: Secondary | ICD-10-CM | POA: Diagnosis not present

## 2020-05-02 DIAGNOSIS — M25551 Pain in right hip: Secondary | ICD-10-CM | POA: Diagnosis not present

## 2020-05-02 DIAGNOSIS — E119 Type 2 diabetes mellitus without complications: Secondary | ICD-10-CM | POA: Diagnosis not present

## 2020-05-02 NOTE — Telephone Encounter (Signed)
Please D/C physical therapy  Thanks for letting me know

## 2020-05-02 NOTE — Telephone Encounter (Signed)
Erin Beck PT with [facility] called stating that she needs an order to discontinue PT. Erin Beck stated that patient feels that she is getting around pretty good and does not need PT any longer. [name] requested a call back to DC PT.

## 2020-05-02 NOTE — Telephone Encounter (Signed)
Left VM letting Erin Beck know to D/C PT

## 2020-05-06 ENCOUNTER — Other Ambulatory Visit: Payer: Medicare HMO

## 2020-05-06 ENCOUNTER — Ambulatory Visit: Payer: Medicare HMO

## 2020-05-13 ENCOUNTER — Encounter: Payer: Medicare HMO | Admitting: Family Medicine

## 2020-06-13 ENCOUNTER — Other Ambulatory Visit: Payer: Self-pay | Admitting: Family Medicine

## 2020-06-13 NOTE — Telephone Encounter (Signed)
E-scribed refill.  Plz r/s wellness, cpe and lab visits.

## 2020-06-16 ENCOUNTER — Telehealth: Payer: Self-pay

## 2020-06-16 NOTE — Telephone Encounter (Signed)
[place] Night - Client Nonclinical Telephone Record AccessNurse Client [place] Night - Client Client Site [place] Physician Tower, Roque Lias - MD Contact Type Call Who Is Calling Patient / Member / Family / Caregiver Caller Name [place] Phone Number [phone] Patient Name Erin Beck Patient DOB Feb 22, 1941 Call Type Message Only Information Provided Reason for Call Request for General Office Information Initial Comment Needing to verify appt time. Disp. Time Disposition Final User 06/16/2020 7:50:20 AM General Information Provided Yes Silvano Rusk Call Closed By: Silvano Rusk Transaction Date/Time: 06/16/2020 7:48:25 AM (ET)

## 2020-06-17 NOTE — Telephone Encounter (Signed)
I called patient and let her know her appointment is 06/21/20 at 3:30.

## 2020-06-21 ENCOUNTER — Ambulatory Visit: Payer: Medicare HMO | Admitting: Family Medicine

## 2020-07-06 ENCOUNTER — Other Ambulatory Visit: Payer: Self-pay | Admitting: Family Medicine

## 2020-08-04 ENCOUNTER — Other Ambulatory Visit: Payer: Self-pay | Admitting: Family Medicine

## 2020-08-09 ENCOUNTER — Ambulatory Visit: Payer: Medicare HMO

## 2020-08-15 ENCOUNTER — Other Ambulatory Visit: Payer: Self-pay | Admitting: Family Medicine

## 2020-08-16 ENCOUNTER — Encounter: Payer: Medicare HMO | Admitting: Family Medicine

## 2020-08-16 NOTE — Telephone Encounter (Signed)
Pt has cancelled multiple CPE appts recently and no future appts., please advise

## 2020-08-16 NOTE — Telephone Encounter (Signed)
Please re schedule PE and refill until then 

## 2020-08-17 NOTE — Telephone Encounter (Signed)
Med refilled once and [name] will reach out to try and get appt scheduled  

## 2020-08-17 NOTE — Telephone Encounter (Signed)
I left a detailed message on patient's voice mail to call back and reschedule physical.

## 2020-09-14 ENCOUNTER — Other Ambulatory Visit: Payer: Self-pay | Admitting: Family Medicine

## 2020-09-15 NOTE — Telephone Encounter (Signed)
Pharmacy requests refill on: Paroxetine 10 mg   LAST REFILL: 04/14/2020 (Q-45, R-1) LAST OV: 04/20/2020 NEXT OV: Not Scheduled  PHARMACY: [facility]

## 2020-10-24 ENCOUNTER — Other Ambulatory Visit: Payer: Self-pay | Admitting: Family Medicine

## 2020-10-24 NOTE — Telephone Encounter (Signed)
Please schedule f/u and refill until then  

## 2020-10-24 NOTE — Telephone Encounter (Signed)
Pt has had a few acute appts but has cancelled multiple CPE appts, no future appts and last lipid labs were in 2020, please advise

## 2020-10-25 NOTE — Telephone Encounter (Signed)
I spoke to patient and she said she would call back to schedule f/u appointment.

## 2020-10-25 NOTE — Telephone Encounter (Signed)
Med refilled once and [name] will reach out to pt to try and get appt scheduled  

## 2020-11-03 ENCOUNTER — Other Ambulatory Visit: Payer: Self-pay | Admitting: Family Medicine

## 2020-11-24 ENCOUNTER — Other Ambulatory Visit: Payer: Self-pay

## 2020-11-24 ENCOUNTER — Ambulatory Visit (INDEPENDENT_AMBULATORY_CARE_PROVIDER_SITE_OTHER)
Admission: RE | Admit: 2020-11-24 | Discharge: 2020-11-24 | Disposition: A | Discharge status: Home / Self Care | Payer: Medicare HMO | Source: Ambulatory Visit | Attending: Family Medicine | Admitting: Family Medicine

## 2020-11-24 ENCOUNTER — Other Ambulatory Visit: Payer: Self-pay | Admitting: Family Medicine

## 2020-11-24 ENCOUNTER — Encounter: Payer: Self-pay | Admitting: Family Medicine

## 2020-11-24 ENCOUNTER — Ambulatory Visit (INDEPENDENT_AMBULATORY_CARE_PROVIDER_SITE_OTHER): Payer: Medicare HMO | Admitting: Family Medicine

## 2020-11-24 VITALS — BP 134/72 | HR 71 | Temp 97.0°F | Ht 60.5 in | Wt 142.1 lb

## 2020-11-24 DIAGNOSIS — E1169 Type 2 diabetes mellitus with other specified complication: Secondary | ICD-10-CM | POA: Diagnosis not present

## 2020-11-24 DIAGNOSIS — W19XXXD Unspecified fall, subsequent encounter: Secondary | ICD-10-CM

## 2020-11-24 DIAGNOSIS — F419 Anxiety disorder, unspecified: Secondary | ICD-10-CM | POA: Diagnosis not present

## 2020-11-24 DIAGNOSIS — M25552 Pain in left hip: Secondary | ICD-10-CM

## 2020-11-24 DIAGNOSIS — R413 Other amnesia: Secondary | ICD-10-CM | POA: Diagnosis not present

## 2020-11-24 DIAGNOSIS — F0390 Unspecified dementia without behavioral disturbance: Secondary | ICD-10-CM | POA: Insufficient documentation

## 2020-11-24 DIAGNOSIS — E785 Hyperlipidemia, unspecified: Secondary | ICD-10-CM

## 2020-11-24 DIAGNOSIS — E1159 Type 2 diabetes mellitus with other circulatory complications: Secondary | ICD-10-CM | POA: Diagnosis not present

## 2020-11-24 DIAGNOSIS — I10 Essential (primary) hypertension: Secondary | ICD-10-CM

## 2020-11-24 LAB — CBC WITH DIFFERENTIAL/PLATELET
Basophils Absolute: 0.1 10*3/uL (ref 0.0–0.1)
Basophils Relative: 0.8 % (ref 0.0–3.0)
Eosinophils Absolute: 0.1 10*3/uL (ref 0.0–0.7)
Eosinophils Relative: 1.2 % (ref 0.0–5.0)
HCT: 45 % (ref 36.0–46.0)
Hemoglobin: 15.4 g/dL — ABNORMAL HIGH (ref 12.0–15.0)
Lymphocytes Relative: 11.1 % — ABNORMAL LOW (ref 12.0–46.0)
Lymphs Abs: 1 10*3/uL (ref 0.7–4.0)
MCHC: 34.3 g/dL (ref 30.0–36.0)
MCV: 91.4 fl (ref 78.0–100.0)
Monocytes Absolute: 0.5 10*3/uL (ref 0.1–1.0)
Monocytes Relative: 5 % (ref 3.0–12.0)
Neutro Abs: 7.6 10*3/uL (ref 1.4–7.7)
Neutrophils Relative %: 81.9 % — ABNORMAL HIGH (ref 43.0–77.0)
Platelets: 654 10*3/uL — ABNORMAL HIGH (ref 150.0–400.0)
RBC: 4.92 Mil/uL (ref 3.87–5.11)
RDW: 13 % (ref 11.5–15.5)
WBC: 9.4 10*3/uL (ref 4.0–10.5)

## 2020-11-24 LAB — LIPID PANEL
Cholesterol: 235 mg/dL — ABNORMAL HIGH (ref 0–200)
HDL: 46.6 mg/dL (ref >39.00)
NonHDL: 188.19
Total CHOL/HDL Ratio: 5
Triglycerides: 233 mg/dL — ABNORMAL HIGH (ref 0.0–149.0)
VLDL: 46.6 mg/dL — ABNORMAL HIGH (ref 0.0–40.0)

## 2020-11-24 LAB — BASIC METABOLIC PANEL
BUN: 17 mg/dL (ref 6–23)
CO2: 32 mEq/L (ref 19–32)
Calcium: 9.6 mg/dL (ref 8.4–10.5)
Chloride: 92 mEq/L — ABNORMAL LOW (ref 96–112)
Creatinine, Ser: 0.72 mg/dL (ref 0.40–1.20)
GFR: 79.43 mL/min (ref >60.00)
Glucose, Bld: 399 mg/dL — ABNORMAL HIGH (ref 70–99)
Potassium: 4.5 mEq/L (ref 3.5–5.1)
Sodium: 132 mEq/L — ABNORMAL LOW (ref 135–145)

## 2020-11-24 LAB — TSH: TSH: 1.21 u[IU]/mL (ref 0.35–4.50)

## 2020-11-24 LAB — HEMOGLOBIN A1C: Hgb A1c MFr Bld: 14.2 % — ABNORMAL HIGH (ref 4.6–6.5)

## 2020-11-24 LAB — LDL CHOLESTEROL, DIRECT: Direct LDL: 161 mg/dL

## 2020-11-24 LAB — VITAMIN B12: Vitamin B-12: 597 pg/mL (ref 211–911)

## 2020-11-24 MED ORDER — DONEPEZIL HCL 5 MG PO TABS: 5.0000 mg | ORAL_TABLET | Freq: Every day | ORAL | 1 refills | Status: DC

## 2020-11-24 NOTE — Progress Notes (Signed)
Subjective:    Patient ID: Erin Beck, female    DOB: 1940-11-21, 80 y.o.   MRN: [id number]  This visit occurred during the SARS-CoV-2 public health emergency.  Safety protocols were in place, including screening questions prior to the visit, additional usage of staff PPE, and extensive cleaning of exam room while observing appropriate contact time as indicated for disinfecting solutions.    HPI  Pt presents to discuss memory changes   Wt Readings from Last 3 Encounters:  11/24/20 142 lb 1 oz (64.4 kg)  04/12/20 177 lb (80.3 kg)  03/28/20 176 lb 4 oz (79.9 kg)   27.29 kg/m  Family brought her today  Short term memory is spotty  Sometimes remembers thinks from long ago and thinks happening now  Going on since [place] son saying someone broke into the house or that her husb just had a stroke  Not so   Says she sleeps fairly  Brother died 2 wk ago- with alzheimers  Feels good most of the time  C/o about her knees  Diabetes is good   Lost significant weight  Eating less (?)  Moving more   HTN BP Readings from Last 3 Encounters:  11/24/20 134/72  04/12/20 130/84  03/28/20 (!) 154/82   Taking lisinopril 10 mg daily and hctz 25 mg daily   paxil for mood  Lab Results  Component Value Date   CHOL 198 07/06/2019   HDL 42.50 07/06/2019   LDLCALC 171 (H) 11/25/2017   LDLDIRECT 116.0 07/06/2019   TRIG 287.0 (H) 07/06/2019   CHOLHDL 5 07/06/2019   crestor 10 mg twice weekly   Sees endocrinology for DM  No results found for: VITAMINB12 Lab Results  Component Value Date   TSH 1.66 04/28/2019    Patient Active Problem List   Diagnosis Date Noted  . Memory loss 11/24/2020  . Right hip pain 04/12/2020  . Urine color appears bright 03/28/2020  . Chest wall pain 03/28/2020  . Fall (on) (from) other stairs and steps, initial encounter 03/28/2020  . Medicare annual wellness visit, subsequent 05/06/2019  . Cerumen impaction 12/13/2017  . Facial  paresthesia 12/02/2017  . Palpitations 04/02/2017  . Atherosclerotic heart disease of native coronary artery with other forms of angina pectoris ([place]) 05/23/2016  . Abnormal stress test 12/29/2015  . Type 2 diabetes mellitus with circulatory disorder, without long-term current use of insulin ([place]) 06/17/2015  . Routine general medical examination at a health care facility 06/14/2015  . Hearing loss of aging 06/14/2015  . 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 Chronic neck and back pain 06/15/2014  . Vitreomacular adhesion 05/05/2013  . Unspecified asthma(493.90) 04/23/2013  . Personal history of colonic polyps 03/03/2013  . Post-menopausal 10/17/2012  . Class 1 obesity without serious comorbidity with body mass index (BMI) of 33.0 to 33.9 in adult 10/31/2011  . IBS (irritable bowel syndrome) 08/10/2011  . GERD 07/31/2010  . Allergic rhinitis 12/05/2007  . HYPERTENSION, BENIGN ESSENTIAL 02/05/2007  . TOBACCO USE, QUIT 02/05/2007  . Hyperlipidemia associated with type 2 diabetes mellitus ([place]) 01/31/2007  . Anxiety 01/31/2007  . Osteoarthritis 01/31/2007  . DIVERTICULITIS, HX OF 01/31/2007   Past Medical History:  Diagnosis Date  . Allergic rhinitis   . Anxiety   . Arthritis    OA  . Asthma   . CAD (coronary artery disease), native coronary artery 05/23/2016   Non obstructive with 40% mid LAD and 60% ramus  . Complication of anesthesia  gas bubble rt eye from an old retinal detachment  . Diabetes mellitus    type II  . Diverticulitis    Hx of  . Full dentures   . GERD (gastroesophageal reflux disease)   . Hematuria    HX OF MICROSCOPIC BLOOD IN URINE AND HX OF CYST ON ONE KIDNEY--BUT NO KIDNEY DISEASE  . Hyperlipidemia   . Hypertension   . IBS (irritable bowel syndrome)   . Osteopenia   . Wears glasses    Past Surgical History:  Procedure Laterality Date  . ABDOMINAL HYSTERECTOMY    . APPENDECTOMY     per pt  . CARDIAC CATHETERIZATION N/A 12/29/2015   Procedure:  Left Heart Cath and Coronary Angiography;  Surgeon: Belva Crome, MD;  Location: [facility];  Service: Cardiovascular;  Laterality: N/A;  . CARPAL TUNNEL RELEASE     both hands  . CHOLECYSTECTOMY    . COLONOSCOPY    . EYE SURGERY     BILATERAL CATARACTS REMOVED  . EYE SURGERY     gas bubble in rt eye from a retinal detachment  . JOINT REPLACEMENT  2000   LEFT TOTAL KNEE ARTHROPLASTY  . JOINT REPLACEMENT  2013   rt total knee  . KNEE ARTHROSCOPY  1990   chondromalacia   . TOTAL KNEE ARTHROPLASTY  12/05/2011   Procedure: TOTAL KNEE ARTHROPLASTY;  Surgeon: Magnus Sinning, MD;  Location: [facility];  Service: Orthopedics;  Laterality: Right;  . ULNAR NERVE TRANSPOSITION Right 09/15/2014   Procedure: Right cubital tunnel release with transposition of ulnar nerve;  Surgeon: Marianna Payment, MD;  Location: [facility];  Service: Orthopedics;  Laterality: Right;   Social History   Tobacco Use  . Smoking status: Former Smoker    Packs/day: 1.00    Years: 48.00    Pack years: 48.00    Quit date: 08/20/2009    Years since quitting: 11.2  . Smokeless tobacco: Never Used  Substance Use Topics  . Alcohol use: No    Alcohol/week: 0.0 standard drinks  . Drug use: No   Family History  Problem Relation Age of Onset  . Pneumonia Mother   . Diabetes Father   . Cancer Father        of the gallbladder  . Heart disease Maternal Grandmother   . Heart disease Paternal Grandmother   . Diabetes Brother    Allergies  Allergen Reactions  . Naproxen Rash    Trouble breathing  . Naproxen Sodium Rash    Trouble breathing  . Aspirin     REACTION: burns stomach PT STATES SHE DOES TOLERATE THE 81 MG ASPIRIN DAILY  . Atorvastatin     REACTION: muscle pain  . Buspar [Buspirone Hcl]     Multiple side eff  . Gabapentin Other (See Comments)    Palpitations/ dizziness   . Metformin And Related     Mm aches, palpitations, SOB, worse eyesight, loss of balance, sweating, low  CBGs  . Metoprolol Other (See Comments)    dizziness  . Norco [Hydrocodone-Acetaminophen] Other (See Comments)    Dizzy and problems remembering   . Pravastatin     Leg pain  . Simvastatin     REACTION: GI side eff and swelling  . Sulfa Antibiotics Other (See Comments)    Severe joint pain  . Sulfonamide Derivatives     REACTION: reaction not known  . Zetia [Ezetimibe]     Caused dizziness   Current Outpatient  Medications on File Prior to Visit  Medication Sig Dispense Refill  . ACCU-CHEK FASTCLIX LANCETS MISC TEST FOUR TIMES DAILY AS DIRECTED 200 each 3  . acetaminophen (TYLENOL) 325 MG tablet Take 650 mg by mouth every 6 (six) hours as needed (pain).    Marland Kitchen albuterol (VENTOLIN HFA) 108 (90 Base) MCG/ACT inhaler Inhale 2 puffs into the lungs every 6 (six) hours as needed for wheezing or shortness of breath. Reported on 12/30/2015 18 g 3  . ALPRAZolam (XANAX) 0.5 MG tablet Take 0.5-1 tablets (0.25-0.5 mg total) by mouth daily as needed for anxiety or sleep. 30 tablet 0  . aspirin 81 MG tablet Take 81 mg by mouth daily.    . Blood Glucose Monitoring Suppl (ONETOUCH VERIO) w/Device KIT 1 kit by Does not apply route as directed. 1 kit 0  . cetirizine (ZYRTEC) 10 MG tablet Take 10 mg by mouth daily as needed for allergies.    . fluticasone (FLONASE) 50 MCG/ACT nasal spray Place 2 sprays into both nostrils daily. 48 g 3  . glipiZIDE (GLUCOTROL XL) 5 MG 24 hr tablet TAKE 2 TABLETS EVERY DAY WITH BREAKFAST 180 tablet 0  . glucose blood (ACCU-CHEK GUIDE) test strip Use as instructed to test once daily 100 each 12  . hydrochlorothiazide (HYDRODIURIL) 25 MG tablet TAKE 1 TABLET EVERY DAY 90 tablet 1  . Lancets (ONETOUCH ULTRASOFT) lancets Use as instructed 100 each 12  . lisinopril (ZESTRIL) 10 MG tablet TAKE 1 TABLET EVERY DAY 90 tablet 0  . nitroGLYCERIN (NITROSTAT) 0.4 MG SL tablet Place 1 tablet (0.4 mg total) under the tongue every 5 (five) minutes as needed for chest pain. 25 tablet 3  .  omeprazole (PRILOSEC) 20 MG capsule TAKE 1 CAPSULE EVERY DAY 90 capsule 0  . PARoxetine (PAXIL) 10 MG tablet TAKE 1/2 TABLET EVERY DAY 45 tablet 3  . rosuvastatin (CRESTOR) 10 MG tablet TAKE 1 TABLET ON MONDAY AND THURSDAY 24 tablet 0   No current facility-administered medications on file prior to visit.    Review of Systems  Constitutional: Negative for activity change, appetite change, fatigue, fever and unexpected weight change.       Wt loss noted, eating less ? If forgetting to eat  HENT: Negative for congestion, ear pain, rhinorrhea, sinus pressure and sore throat.   Eyes: Negative for pain, redness and visual disturbance.  Respiratory: Negative for cough, shortness of breath and wheezing.   Cardiovascular: Negative for chest pain and palpitations.  Gastrointestinal: Negative for abdominal pain, blood in stool, constipation and diarrhea.  Endocrine: Negative for polydipsia and polyuria.  Genitourinary: Negative for dysuria, frequency and urgency.  Musculoskeletal: Negative for arthralgias, back pain and myalgias.  Skin: Negative for pallor and rash.  Allergic/Immunologic: Negative for environmental allergies.  Neurological: Negative for dizziness, syncope and headaches.  Hematological: Negative for adenopathy. Does not bruise/bleed easily.  Psychiatric/Behavioral: Positive for confusion and decreased concentration. Negative for dysphoric mood. The patient is not nervous/anxious.        Objective:   Physical Exam Constitutional:      General: She is not in acute distress.    Appearance: Normal appearance. She is normal weight. She is not ill-appearing.  Eyes:     General: No scleral icterus.    Conjunctiva/sclera: Conjunctivae normal.     Pupils: Pupils are equal, round, and reactive to light.  Neck:     Vascular: No carotid bruit.  Cardiovascular:     Rate and Rhythm: Normal rate and regular rhythm.  Heart sounds: Normal heart sounds.  Pulmonary:     Effort:  Pulmonary effort is normal. No respiratory distress.  Musculoskeletal:     Cervical back: Normal range of motion and neck supple.     Right lower leg: No edema.     Left lower leg: No edema.  Lymphadenopathy:     Cervical: No cervical adenopathy.  Skin:    Coloration: Skin is not pale.     Findings: No erythema or rash.  Neurological:     Mental Status: She is alert.     Coordination: Coordination normal.  Psychiatric:        Mood and Affect: Mood normal.        Cognition and Memory: Cognition is impaired. She exhibits impaired recent memory.           Assessment & Plan:   Problem List Items Addressed This Visit      Cardiovascular and Mediastinum   Type 2 diabetes mellitus with circulatory disorder, without long-term current use of insulin (HCC) (Chronic)    Overdue for endocrinology f/u and labs a1c added today      Relevant Orders   Hemoglobin A1c   HYPERTENSION, BENIGN ESSENTIAL    bp in fair control at this time  BP Readings from Last 1 Encounters:  11/24/20 134/72   No changes needed Most recent labs reviewed  Disc lifstyle change with low sodium diet and exercise  Plan to continue lisinopril 10 mg daily and hctz 25 mg daily  Labs orderd      Relevant Orders   TSH   Basic metabolic panel   CBC with Differential/Platelet   Lipid panel     Endocrine   Hyperlipidemia associated with type 2 diabetes mellitus ([place])    Disc goals for lipids and reasons to control them Rev last labs with pt Rev low sat fat diet in detail Labs today  Continues crestor 10 mg twice weekly       Relevant Orders   Lipid panel     Other   Anxiety    Fairly stable on paxil  Memory issues seem separate      Memory loss - Primary    Signs of mild cognitive impairment with some confusion/sundowning  MMSE of 23 out of 30 with nl clock draw Anxiety is stable  Disc with family and DPR obtained  Px aricept 5 mg qhs reviewing poss side eff Disc healthy habits for memory   Lab today F/u 2 mo to review/discuss further  If family desires neuro ref they will reach out in the meantime      Relevant Orders   Vitamin B12

## 2020-11-24 NOTE — Assessment & Plan Note (Signed)
Overdue for endocrinology f/u and labs a1c added today

## 2020-11-24 NOTE — Assessment & Plan Note (Signed)
Signs of mild cognitive impairment with some confusion/sundowning  MMSE of 23 out of 30 with nl clock draw Anxiety is stable  Disc with family and DPR obtained  Px aricept 5 mg qhs reviewing poss side eff Disc healthy habits for memory  Lab today F/u 2 mo to review/discuss further  If family desires neuro ref they will reach out in the meantime

## 2020-11-24 NOTE — Assessment & Plan Note (Signed)
bp in fair control at this time  BP Readings from Last 1 Encounters:  11/24/20 134/72   No changes needed Most recent labs reviewed  Disc lifstyle change with low sodium diet and exercise  Plan to continue lisinopril 10 mg daily and hctz 25 mg daily  Labs orderd

## 2020-11-24 NOTE — Assessment & Plan Note (Signed)
Disc goals for lipids and reasons to control them Rev last labs with pt Rev low sat fat diet in detail Labs today  Continues crestor 10 mg twice weekly

## 2020-11-24 NOTE — Patient Instructions (Addendum)
Labs today   Read about the generic aricept and try 5 mg at bedtime If problems or side effects stop it and let me know   Try to eat regularly - protein with every meal  Also good fluid intake   Follow up in about 2 months (ealier if needed)  Let us know if you want to see a neurologist for memory and cognition    Stop up front to get DPR

## 2020-11-24 NOTE — Assessment & Plan Note (Signed)
Fairly stable on paxil  Memory issues seem separate

## 2020-11-28 ENCOUNTER — Other Ambulatory Visit: Payer: Self-pay | Admitting: Family Medicine

## 2020-11-29 ENCOUNTER — Telehealth: Payer: Self-pay | Admitting: Family Medicine

## 2020-11-29 DIAGNOSIS — M25551 Pain in right hip: Secondary | ICD-10-CM

## 2020-11-29 DIAGNOSIS — E1159 Type 2 diabetes mellitus with other circulatory complications: Secondary | ICD-10-CM

## 2020-11-29 NOTE — Telephone Encounter (Signed)
-----   Message from Tammi Sou, [place] sent at 11/29/2020  4:55 PM EDT ----- Pt notified of xray results and also son, they agreed with Ortho referral. Pt said she has been to [facility] in the past in [place] and would like to go back to them. I advise pt PCP will put referral in and our [facility] will call to schedule appt

## 2020-11-29 NOTE — Telephone Encounter (Signed)
Ortho referral done  For diabetes- Dr Cruzita Lederer states pt was lost to f/u after 2 years  I placed a referral for her as well

## 2020-12-07 ENCOUNTER — Telehealth: Payer: Self-pay | Admitting: Family Medicine

## 2020-12-07 DIAGNOSIS — I10 Essential (primary) hypertension: Secondary | ICD-10-CM

## 2020-12-07 DIAGNOSIS — R413 Other amnesia: Secondary | ICD-10-CM

## 2020-12-07 DIAGNOSIS — E1159 Type 2 diabetes mellitus with other circulatory complications: Secondary | ICD-10-CM

## 2020-12-07 NOTE — Telephone Encounter (Signed)
-----   Message from Pete Pelt, LPN sent at 2/75/1700 10:47 AM EDT ----- Patient notified as instructed by telephone and verbalized understanding.  Patient stated that she is okay with the referral to see what they may have to offer.

## 2020-12-08 ENCOUNTER — Telehealth: Payer: Self-pay | Admitting: Family Medicine

## 2020-12-08 ENCOUNTER — Telehealth: Payer: Self-pay | Admitting: *Deleted

## 2020-12-08 NOTE — Chronic Care Management (AMB) (Signed)
  Chronic Care Management   Note  12/08/2020 Name: Erin Beck MRN: [id number] DOB: 1940-11-07  Erin Beck is a 80 y.o. year old female who is a primary care patient of Tower, [name], MD. I reached out to Elon Jester by phone today in response to a referral sent by [name]'s PCP, Tower, [name], MD.   [name] was given information about Chronic Care Management services today including:  1. CCM service includes personalized support from designated clinical staff supervised by her physician, including individualized plan of care and coordination with other care providers 2. 24/7 contact phone numbers for assistance for urgent and routine care needs. 3. Service will only be billed when office clinical staff spend 20 minutes or more in a month to coordinate care. 4. Only one practitioner may furnish and bill the service in a calendar month. 5. The patient may stop CCM services at any time (effective at the end of the month) by phone call to the office staff.   Patient agreed to services and verbal consent obtained.   Follow up plan:   Lauretta Grill [facility] Scheduler

## 2020-12-08 NOTE — Chronic Care Management (AMB) (Signed)
  Chronic Care Management   Note  12/08/2020 Name: LAVANDA NEVELS MRN: [id number] DOB: November 04, 1940  Erin Beck is a 80 y.o. year old female who is a primary care patient of Tower, [name], MD. I reached out to Elon Jester by phone today in response to a referral sent by Ms. Scarlette Calico Miu's PCP, Tower, [name], MD.     [name] was given information about Chronic Care Management services today including:  1. CCM service includes personalized support from designated clinical staff supervised by her physician, including individualized plan of care and coordination with other care providers 2. 24/7 contact phone numbers for assistance for urgent and routine care needs. 3. Service will only be billed when office clinical staff spend 20 minutes or more in a month to coordinate care. 4. Only one practitioner may furnish and bill the service in a calendar month. 5. The patient may stop CCM services at any time (effective at the end of the month) by phone call to the office staff. 6. The patient will be responsible for cost sharing (co-pay) of up to 20% of the service fee (after annual deductible is met).  Patient agreed to services and verbal consent obtained.   Follow up plan: Telephone appointment with care management team member scheduled for:  [facility] 8/34/1962 Licensed Clinical SW 12/19/2020  [place] Management

## 2020-12-12 ENCOUNTER — Telehealth: Payer: Medicare HMO

## 2020-12-12 ENCOUNTER — Other Ambulatory Visit: Payer: Self-pay

## 2020-12-12 ENCOUNTER — Telehealth: Payer: Self-pay

## 2020-12-12 NOTE — Telephone Encounter (Signed)
  Chronic Care Management   Outreach Note  12/12/2020 Name: Erin Beck MRN: [id number] DOB: 1941-05-22  Referred by: Tower, [name], MD Reason for referral : Chronic Care Management (RNCM initial outreach )   An unsuccessful telephone outreach was attempted today. Patient attempted and phone only rang. Unable to leave voice message.  The patient was referred to the case management team for assistance with care management and care coordination.   Follow Up Plan: The care management team will reach out to the patient again over the next 2 weeks.  days.   Quinn Plowman RN,BSN,CCM RN Case Manager [place]  [phone]

## 2020-12-13 ENCOUNTER — Telehealth: Payer: Self-pay

## 2020-12-13 ENCOUNTER — Telehealth: Payer: Self-pay | Admitting: *Deleted

## 2020-12-13 NOTE — Telephone Encounter (Signed)
[place] Night - Client Nonclinical Telephone Record AccessNurse Client [place] Night - Client Client Site [place] Physician Tower, Roque Lias - MD Contact Type Call Who Is Calling Patient / Member / Family / Caregiver Caller Name Declined to provide Caller Phone Number Declined to provide Patient Name Sullivan Lone Call Type Message Only Information Provided Reason for Call Request for General Office Information Initial Comment Caller states she wants to know if she has an appt. Disp. Time Disposition Final User 12/13/2020 4:19:21 AM General Information Provided Yes Mable [place]

## 2020-12-13 NOTE — Chronic Care Management (AMB) (Signed)
  Care Management   Note  12/13/2020 Name: Erin Beck MRN: [id number] DOB: 17-Apr-1941  Erin Beck is a 80 y.o. year old female who is a primary care patient of Tower, [name], MD and is actively engaged with the care management team. I reached out to Elon Jester by phone today to assist with re-scheduling an initial visit with the RN Case Manager.  Follow up plan: Unsuccessful telephone outreach attempt made. The care management team will reach out to the patient again over the next 7 days. If patient returns call to provider office, please advise to call [place] at [phone].  [place] Management

## 2020-12-19 ENCOUNTER — Other Ambulatory Visit: Payer: Self-pay

## 2020-12-19 ENCOUNTER — Telehealth: Payer: Medicare HMO | Admitting: *Deleted

## 2020-12-19 ENCOUNTER — Telehealth: Payer: Self-pay | Admitting: *Deleted

## 2020-12-19 NOTE — Telephone Encounter (Signed)
CSW attempted initial outreach call to pt at scheduled time and was unsuccessful in reaching. CSW was able to leave a HIPPA compliant voice message and will await callback- as well, will ask Care Guide to reschedule visit for another day if no return call received within a time slot available today.   Eduard Clos MSW, LCSW Licensed Clinical Social Worker [place] [phone]

## 2020-12-20 ENCOUNTER — Telehealth: Payer: Self-pay | Admitting: *Deleted

## 2020-12-20 NOTE — Chronic Care Management (AMB) (Signed)
  Care Management   Note  12/20/2020 Name: Erin Beck MRN: [id number] DOB: [birth year]  Erin Beck is a 80 y.o. year old female who is a primary care patient of Tower, [name], MD and is actively engaged with the care management team. I reached out to Elon Jester by phone today to assist with re-scheduling an initial visit with the Licensed Clinical Social Worker  Follow up plan: Patient declines further follow up and engagement by the care management team. Appropriate care team members and provider have been notified via electronic communication. The care management team is available to follow up with the patient after provider conversation with the patient regarding recommendation for care management engagement and subsequent re-referral to the care management team.   [place] Management

## 2021-01-04 ENCOUNTER — Other Ambulatory Visit: Payer: Self-pay | Admitting: Family Medicine

## 2021-01-10 ENCOUNTER — Ambulatory Visit: Payer: Medicare HMO | Admitting: Internal Medicine

## 2021-01-10 ENCOUNTER — Telehealth: Payer: Medicare HMO

## 2021-01-10 ENCOUNTER — Ambulatory Visit: Payer: Self-pay

## 2021-01-10 NOTE — Progress Notes (Deleted)
Patient ID: Erin Beck, female   DOB: September 07, 1940, 80 y.o.   MRN: [id number]  This visit occurred during the SARS-CoV-2 public health emergency.  Safety protocols were in place, including screening questions prior to the visit, additional usage of staff PPE, and extensive cleaning of exam room while observing appropriate contact time as indicated for disinfecting solutions.   HPI: Erin Beck is a 80 y.o.-year-old female, presenting for f/u for DM2, non-insulin-dependent, dx 2005, uncontrolled, with complications (CAD). Last visit 2 years ago.  Previously  Interim history: She continues to be stressed with her husband having aggressive bladder cancer. Since last visit, her brother died in 15-Nov-2020 (had Alzheimer's dementia). She lost a significant amount of weight since last visit, more than 30 pounds. She complains of memory loss.  Last hemoglobin A1c was: Lab Results  Component Value Date   HGBA1C 14.2 Repeated and verified X2. (H) 11/24/2020   HGBA1C 6.6 (A) 07/11/2018   HGBA1C 7.6 (A) 03/13/2018  Labs from Dr. Erlinda Hong [facility], 08/30/2014): HbA1c 6.9%, decreased from 8.0%  Pt is on a regimen of: - Glipizide XL 10 mg in am We tried Januvia 100  in a.m.>> $$$. Stopped Metformin ER 1500 mg in pm before cath but she was not feeling well on it (diarrhea, weakness, thirst, leg swelling, blurry vision) She has not been on insulin before, would not like to stick herself.  Pt checks her sugars 2-3 times a day: - am: 154-175 >> 119-145, 151, 158 >> 133-145 - 2h after b'fast: 97, 154 >> n/c  - before lunch: 65, 88-128 >> n/c >> 91, 104-130, 145 >> n/c - 2h after lunch: 76, 111-140 >> 93-154 >> n/c - before dinner: 111, 132, 154 >> 90, 102-134 >> n/c - 2h after dinner: 110-140, 168 >> n/c >> 119 >> 93-136 >> n/c - bedtime:  132 >> 121, 137 >> n/c - nighttime: 114-122 >> 121-127 >> n/c Lowest sugar was 102 >> 100;  she has hypoglycemia awareness in the 80. Highest sugar was 173 >>  150.  Pt's meals are: - Breakfast: cereals - Lunch: salads, 1 sandwich, leftovers - Dinner: salmon/chicken, vegetables - Snacks: 1 fruit, crackers   -No CKD, last BUN/creatinine:  Lab Results  Component Value Date   BUN 17 11/24/2020   CREATININE 0.72 11/24/2020  On lisinopril. -+ HL; last set of lipids: Lab Results  Component Value Date   CHOL 235 (H) 11/24/2020   HDL 46.60 11/24/2020   LDLCALC 171 (H) 11/25/2017   LDLDIRECT 161.0 11/24/2020   TRIG 233.0 (H) 11/24/2020   CHOLHDL 5 11/24/2020  She had abdominal pain with statins.  She is also intolerant to ezetimibe.  - last eye exam was in 02/2018: No DR h/o cataract. Dr. Zadie Rhine.  - no numbness and tingling in her feet.  ROS: Constitutional: + weight loss, no fatigue, no subjective hyperthermia, no subjective hypothermia Eyes: no blurry vision, no xerophthalmia ENT: no sore throat, no nodules palpated in neck, no dysphagia, no odynophagia Cardiovascular: no CP/no SOB/no palpitations/no leg swelling Respiratory: no cough/no SOB/no wheezing Gastrointestinal: no N/no V/no D/no C/no acid reflux Musculoskeletal: no muscle aches/+ joint aches (knees) Skin: no rashes, no hair loss Neurological: no tremors/no numbness/no tingling/no dizziness  I reviewed pt's medications, allergies, PMH, social hx, family hx, and changes were documented in the history of present illness. Otherwise, unchanged from my initial visit note.  Past Medical History:  Diagnosis Date  . Allergic rhinitis   . Anxiety   . Arthritis  OA  . Asthma   . CAD (coronary artery disease), native coronary artery 05/23/2016   Non obstructive with 40% mid LAD and 60% ramus  . Complication of anesthesia    gas bubble rt eye from an old retinal detachment  . Diabetes mellitus    type II  . Diverticulitis    Hx of  . Full dentures   . GERD (gastroesophageal reflux disease)   . Hematuria    HX OF MICROSCOPIC BLOOD IN URINE AND HX OF CYST ON ONE KIDNEY--BUT  NO KIDNEY DISEASE  . Hyperlipidemia   . Hypertension   . IBS (irritable bowel syndrome)   . Osteopenia   . Wears glasses    Past Surgical History:  Procedure Laterality Date  . ABDOMINAL HYSTERECTOMY    . APPENDECTOMY     per pt  . CARDIAC CATHETERIZATION N/A 12/29/2015   Procedure: Left Heart Cath and Coronary Angiography;  Surgeon: Belva Crome, MD;  Location: [place] [facility];  Service: Cardiovascular;  Laterality: N/A;  . CARPAL TUNNEL RELEASE     both hands  . CHOLECYSTECTOMY    . COLONOSCOPY    . EYE SURGERY     BILATERAL CATARACTS REMOVED  . EYE SURGERY     gas bubble in rt eye from a retinal detachment  . JOINT REPLACEMENT  2000   LEFT TOTAL KNEE ARTHROPLASTY  . JOINT REPLACEMENT  2013   rt total knee  . KNEE ARTHROSCOPY  1990   chondromalacia   . TOTAL KNEE ARTHROPLASTY  12/05/2011   Procedure: TOTAL KNEE ARTHROPLASTY;  Surgeon: Magnus Sinning, MD;  Location: [facility];  Service: Orthopedics;  Laterality: Right;  . ULNAR NERVE TRANSPOSITION Right 09/15/2014   Procedure: Right cubital tunnel release with transposition of ulnar nerve;  Surgeon: Marianna Payment, MD;  Location: [place];  Service: Orthopedics;  Laterality: Right;   Social History   Socioeconomic History  . Marital status: Married    Spouse name: Not on file  . Number of children: Not on file  . Years of education: Not on file  . Highest education level: Not on file  Occupational History  . Not on file  Tobacco Use  . Smoking status: Former Smoker    Packs/day: 1.00    Years: 48.00    Pack years: 48.00    Quit date: 08/20/2009    Years since quitting: 11.4  . Smokeless tobacco: Never Used  Substance and Sexual Activity  . Alcohol use: No    Alcohol/week: 0.0 standard drinks  . Drug use: No  . Sexual activity: Never  Other Topics Concern  . Not on file  Social History Narrative  . Not on file   Social Determinants of Health   Financial Resource Strain: Not on file   Food Insecurity: Not on file  Transportation Needs: Not on file  Physical Activity: Not on file  Stress: Not on file  Social Connections: Not on file  Intimate Partner Violence: Not on file   Current Outpatient Medications on File Prior to Visit  Medication Sig Dispense Refill  . ACCU-CHEK FASTCLIX LANCETS MISC TEST FOUR TIMES DAILY AS DIRECTED 200 each 3  . acetaminophen (TYLENOL) 325 MG tablet Take 650 mg by mouth every 6 (six) hours as needed (pain).    Marland Kitchen albuterol (VENTOLIN HFA) 108 (90 Base) MCG/ACT inhaler Inhale 2 puffs into the lungs every 6 (six) hours as needed for wheezing or shortness of breath. Reported on 12/30/2015 18  g 3  . ALPRAZolam (XANAX) 0.5 MG tablet Take 0.5-1 tablets (0.25-0.5 mg total) by mouth daily as needed for anxiety or sleep. 30 tablet 0  . aspirin 81 MG tablet Take 81 mg by mouth daily.    . Blood Glucose Monitoring Suppl (ONETOUCH VERIO) w/Device KIT 1 kit by Does not apply route as directed. 1 kit 0  . cetirizine (ZYRTEC) 10 MG tablet Take 10 mg by mouth daily as needed for allergies.    Marland Kitchen donepezil (ARICEPT) 5 MG tablet Take 1 tablet (5 mg total) by mouth at bedtime. 30 tablet 1  . fluticasone (FLONASE) 50 MCG/ACT nasal spray Place 2 sprays into both nostrils daily. 48 g 3  . glipiZIDE (GLUCOTROL XL) 5 MG 24 hr tablet TAKE 2 TABLETS EVERY DAY WITH BREAKFAST 180 tablet 0  . glucose blood (ACCU-CHEK GUIDE) test strip Use as instructed to test once daily 100 each 12  . hydrochlorothiazide (HYDRODIURIL) 25 MG tablet TAKE 1 TABLET EVERY DAY 90 tablet 1  . Lancets (ONETOUCH ULTRASOFT) lancets Use as instructed 100 each 12  . lisinopril (ZESTRIL) 10 MG tablet TAKE 1 TABLET EVERY DAY 90 tablet 0  . nitroGLYCERIN (NITROSTAT) 0.4 MG SL tablet Place 1 tablet (0.4 mg total) under the tongue every 5 (five) minutes as needed for chest pain. 25 tablet 3  . omeprazole (PRILOSEC) 20 MG capsule TAKE 1 CAPSULE EVERY DAY 90 capsule 0  . PARoxetine (PAXIL) 10 MG tablet TAKE  1/2 TABLET EVERY DAY 45 tablet 3  . rosuvastatin (CRESTOR) 10 MG tablet TAKE 1 TABLET ON MONDAY AND THURSDAY 24 tablet 0   No current facility-administered medications on file prior to visit.   Allergies  Allergen Reactions  . Naproxen Rash    Trouble breathing  . Naproxen Sodium Rash    Trouble breathing  . Aspirin     REACTION: burns stomach PT STATES SHE DOES TOLERATE THE 81 MG ASPIRIN DAILY  . Atorvastatin     REACTION: muscle pain  . Buspar [Buspirone Hcl]     Multiple side eff  . Gabapentin Other (See Comments)    Palpitations/ dizziness   . Metformin And Related     Mm aches, palpitations, SOB, worse eyesight, loss of balance, sweating, low CBGs  . Metoprolol Other (See Comments)    dizziness  . Norco [Hydrocodone-Acetaminophen] Other (See Comments)    Dizzy and problems remembering   . Pravastatin     Leg pain  . Simvastatin     REACTION: GI side eff and swelling  . Sulfa Antibiotics Other (See Comments)    Severe joint pain  . Sulfonamide Derivatives     REACTION: reaction not known  . Zetia [Ezetimibe]     Caused dizziness   Family History  Problem Relation Age of Onset  . Pneumonia Mother   . Diabetes Father   . Cancer Father        of the gallbladder  . Heart disease Maternal Grandmother   . Heart disease Paternal Grandmother   . Diabetes Brother     PE: There were no vitals taken for this visit. There is no height or weight on file to calculate BMI. Wt Readings from Last 3 Encounters:  11/24/20 142 lb 1 oz (64.4 kg)  04/12/20 177 lb (80.3 kg)  03/28/20 176 lb 4 oz (79.9 kg)   Constitutional: normal weight, in NAD Eyes: PERRLA, EOMI, no exophthalmos ENT: moist mucous membranes, no thyromegaly, no cervical lymphadenopathy Cardiovascular: RRR, No MRG Respiratory:  CTA B Gastrointestinal: abdomen soft, NT, ND, BS+ Musculoskeletal: no deformities, strength intact in all 4 Skin: moist, warm, no rashes Neurological: no tremor with outstretched  hands, DTR normal in all 4  ASSESSMENT: 1. DM2, previously non-insulin-dependent, uncontrolled, with complications - CAD  2. HL  3.  Obesity  PLAN:  1. Patient with longstanding, previously well-controlled diabetes, only on sulfonylurea, returning after long absence of 2 years.  At last visit sugars were better after she and her husband stopped eating out, however, she was lost to follow-up afterwards and the latest HbA1c obtained in 11/2020 was extremely high, at 14.2%.  This worsening of her diabetes associated with weight loss: She lost approximately 35 pounds in 8-9 months and more than 40 pounds since her last in person visit in 2019, a sign of glucose toxicity. -At this point, we discussed that her sugars are too high to treat her with other medications and we need to start insulin.  I recommended basal insulin, 1 dose a day.  Demonstrated pen use and advised her about correct injection techniques.  - I suggested to:  Patient Instructions  Please continue: - Glipizide XL 10 mg in am  Please return in 3 months with your sugar log.   - advised to check sugars at different times of the day - 1x a day, rotating check times - advised for yearly eye exams >> she is UTD - return to clinic in 3 months    2. HL -Reviewed latest lipid panel from 11/2020: LDL very high, triglycerides also high: Lab Results  Component Value Date   CHOL 235 (H) 11/24/2020   HDL 46.60 11/24/2020   LDLCALC 171 (H) 11/25/2017   LDLDIRECT 161.0 11/24/2020   TRIG 233.0 (H) 11/24/2020   CHOLHDL 5 11/24/2020  -Intolerant to statins and ezetimibe; refused PCSK9 inhibitors, forgot to try Bennecol as suggested at a previous visit  3. Obesity -Continues to work on her diet  Philemon Kingdom, MD PhD [facility] Endocrinology

## 2021-01-10 NOTE — Progress Notes (Signed)
  Chronic Care Management   Outreach Note  01/10/2021 Name: Erin Beck MRN: [id number] DOB: 08/13/1941  Referred by: Tower, [name], MD Reason for referral : Missed CCM   An unsuccessful telephone outreach was attempted today. The patient was referred to the pharmacist for assistance with care management and care coordination. Per chart, patient declined CCM by social work and nursing. Contacted patient to see if she was still interested in pharmacy CCM services.   Follow Up Plan: CCM contact for rescheduling initial visit   Debbora Dus, PharmD Clinical Pharmacist [facility] [phone]

## 2021-01-10 NOTE — Progress Notes (Deleted)
Chronic Care Management Pharmacy Note  01/10/2021 Name:  Erin Beck MRN:  [id number] DOB:  Jan 04, 1941  Subjective: Erin Beck is an 80 y.o. year old female who is a primary patient of Tower, [name], MD.  The CCM team was consulted for assistance with disease management and care coordination needs.    {CCMTELEPHONEFACETOFACE:21091510} for {CCMINITIALFOLLOWUPCHOICE:21091511} in response to provider referral for pharmacy case management and/or care coordination services.   Consent to Services:  {CCMCONSENTOPTIONS:25074}  Patient Care Team: Tower, [name], MD as PCP - General Dannielle Karvonen, RN as [place] Management Marcelline Deist, Ranae Pila, [place] as Social Worker Debbora Dus, [facility] as Pharmacist (Pharmacist)  Recent office visits: ***  Recent consult visits: [facility] visits: {Hospital DC Yes/No:25215}  Objective:  Lab Results  Component Value Date   CREATININE 0.72 11/24/2020   BUN 17 11/24/2020   GFR 79.43 11/24/2020   GFRNONAA >60 03/08/2017   GFRAA >60 03/08/2017   NA 132 (L) 11/24/2020   K 4.5 11/24/2020   CALCIUM 9.6 11/24/2020   CO2 32 11/24/2020   GLUCOSE 399 (H) 11/24/2020    Lab Results  Component Value Date/Time   HGBA1C 14.2 Repeated and verified X2. (H) 11/24/2020 10:42 AM   HGBA1C 6.6 (A) 07/11/2018 02:14 PM   HGBA1C 7.6 (A) 03/13/2018 04:41 PM   HGBA1C 6.8 (H) 03/27/2016 08:18 AM   GFR 79.43 11/24/2020 10:42 AM   GFR 73.60 04/28/2019 09:27 AM   MICROALBUR 1.0 07/27/2010 08:59 AM   MICROALBUR 0.7 12/30/2008 09:59 AM    Last diabetic Eye exam:  Lab Results  Component Value Date/Time   HMDIABEYEEXA No Retinopathy 12/09/2017 12:00 AM    Last diabetic Foot exam:  Lab Results  Component Value Date/Time   HMDIABFOOTEX yes 07/31/2010 12:00 AM     Lab Results  Component Value Date   CHOL 235 (H) 11/24/2020   HDL 46.60 11/24/2020   LDLCALC 171 (H) 11/25/2017   LDLDIRECT 161.0 11/24/2020   TRIG 233.0 (H)  11/24/2020   CHOLHDL 5 11/24/2020    Hepatic Function Latest Ref Rng & Units 07/06/2019 04/28/2019 11/25/2017  Total Protein 6.0 - 8.3 g/dL - 7.1 7.4  Albumin 3.5 - 5.2 g/dL - 4.2 4.1  AST 0 - 37 U/L $Remo'18 27 26  'sADSr$ ALT 0 - 35 U/L 21 41(H) 26  Alk Phosphatase 39 - 117 U/L - 86 55  Total Bilirubin 0.2 - 1.2 mg/dL - 0.6 0.5  Bilirubin, Direct <=0.2 mg/dL - - -    Lab Results  Component Value Date/Time   TSH 1.21 11/24/2020 10:42 AM   TSH 1.66 04/28/2019 09:27 AM    CBC Latest Ref Rng & Units 11/24/2020 04/28/2019 11/25/2017  WBC 4.0 - 10.5 K/uL 9.4 9.3 7.5  Hemoglobin 12.0 - 15.0 g/dL 15.4(H) 14.4 14.6  Hematocrit 36.0 - 46.0 % 45.0 43.1 42.4  Platelets 150.0 - 400.0 K/uL 654.0(H) 376.0 484.0(H)    No results found for: VD25OH  Clinical ASCVD: {YES/NO:21197} The 10-year ASCVD risk score Mikey Bussing DC Jr., et al., 2013) is: 53.2%   Values used to calculate the score:     Age: 46 years     Sex: Female     Is Non-Hispanic African American: No     Diabetic: Yes     Tobacco smoker: No     Systolic Blood Pressure: 127 mmHg     Is BP treated: Yes     HDL Cholesterol: 46.6 mg/dL  Total Cholesterol: 235 mg/dL    Depression screen [facility] 2/9 05/06/2019 11/25/2017 10/01/2016  Decreased Interest 0 0 0  Down, Depressed, Hopeless 0 0 0  PHQ - 2 Score 0 0 0  Altered sleeping - 0 -  Tired, decreased energy - 0 -  Change in appetite - 0 -  Feeling bad or failure about yourself  - 0 -  Trouble concentrating - 0 -  Moving slowly or fidgety/restless - 0 -  Suicidal thoughts - 0 -  PHQ-9 Score - 0 -  Difficult doing work/chores - Not difficult at all -  Some recent data might be hidden     ***Other: (CHADS2VASc if Afib, MMRC or CAT for COPD, ACT, DEXA)  Social History   Tobacco Use  Smoking Status Former Smoker  . Packs/day: 1.00  . Years: 48.00  . Pack years: 48.00  . Quit date: 08/20/2009  . Years since quitting: 11.4  Smokeless Tobacco Never Used   BP Readings from Last 3 Encounters:   11/24/20 134/72  04/12/20 130/84  03/28/20 (!) 154/82   Pulse Readings from Last 3 Encounters:  11/24/20 71  04/12/20 68  03/28/20 89   Wt Readings from Last 3 Encounters:  11/24/20 142 lb 1 oz (64.4 kg)  04/12/20 177 lb (80.3 kg)  03/28/20 176 lb 4 oz (79.9 kg)   BMI Readings from Last 3 Encounters:  11/24/20 27.29 kg/m  04/12/20 34.00 kg/m  03/28/20 33.85 kg/m    Assessment/Interventions: Review of patient past medical history, allergies, medications, health status, including review of consultants reports, laboratory and other test data, was performed as part of comprehensive evaluation and provision of chronic care management services.   SDOH:  (Social Determinants of Health) assessments and interventions performed: {yes/no:20286}  SDOH Screenings   Alcohol Screen: Not on file  Depression (QIH4-7): Not on file  Financial Resource Strain: Not on file  Food Insecurity: Not on file  Housing: Not on file  Physical Activity: Not on file  Social Connections: Not on file  Stress: Not on file  Tobacco Use: Medium Risk  . Smoking Tobacco Use: Former Smoker  . Smokeless Tobacco Use: Never Used  Transportation Needs: Not on file    CCM Care Plan  Allergies  Allergen Reactions  . Naproxen Rash    Trouble breathing  . Naproxen Sodium Rash    Trouble breathing  . Aspirin     REACTION: burns stomach PT STATES SHE DOES TOLERATE THE 81 MG ASPIRIN DAILY  . Atorvastatin     REACTION: muscle pain  . Buspar [Buspirone Hcl]     Multiple side eff  . Gabapentin Other (See Comments)    Palpitations/ dizziness   . Metformin And Related     Mm aches, palpitations, SOB, worse eyesight, loss of balance, sweating, low CBGs  . Metoprolol Other (See Comments)    dizziness  . Norco [Hydrocodone-Acetaminophen] Other (See Comments)    Dizzy and problems remembering   . Pravastatin     Leg pain  . Simvastatin     REACTION: GI side eff and swelling  . Sulfa Antibiotics Other  (See Comments)    Severe joint pain  . Sulfonamide Derivatives     REACTION: reaction not known  . Zetia [Ezetimibe]     Caused dizziness    Medications Reviewed Today    Reviewed by Abner Greenspan, MD (Physician) on 11/24/20 at 1003  Med List Status: <None>  Medication Order Taking? Sig Documenting Provider Last Dose  Status Informant  ACCU-CHEK FASTCLIX LANCETS MISC [id number] Yes TEST FOUR TIMES DAILY AS DIRECTED Philemon Kingdom, MD Taking Active   acetaminophen (TYLENOL) 325 MG tablet [id number] Yes Take 650 mg by mouth every 6 (six) hours as needed (pain). [provider] Taking Active   albuterol (VENTOLIN HFA) 108 (90 Base) MCG/ACT inhaler [id number] Yes Inhale 2 puffs into the lungs every 6 (six) hours as needed for wheezing or shortness of breath. Reported on 12/30/2015 Tower, [name], MD Taking Active   ALPRAZolam Duanne Moron) 0.5 MG tablet [id number] Yes Take 0.5-1 tablets (0.25-0.5 mg total) by mouth daily as needed for anxiety or sleep. Tower, [name], MD Taking Active   aspirin 81 MG tablet [id number] Yes Take 81 mg by mouth daily. [provider] Taking Active Self  Blood Glucose Monitoring Suppl (ONETOUCH VERIO) w/Device KIT [id number] Yes 1 kit by Does not apply route as directed. Philemon Kingdom, MD Taking Active   cetirizine (ZYRTEC) 10 MG tablet [id number] Yes Take 10 mg by mouth daily as needed for allergies. [provider] Taking Active   fluticasone (FLONASE) 50 MCG/ACT nasal spray [id number] Yes Place 2 sprays into both nostrils daily. Tower, [name], MD Taking Active   glipiZIDE (GLUCOTROL XL) 5 MG 24 hr tablet [id number] Yes TAKE 2 TABLETS EVERY DAY WITH Nyoka Cowden, MD Taking Active   glucose blood (ACCU-CHEK GUIDE) test strip [id number] Yes Use as instructed to test once daily Philemon Kingdom, MD Taking Active   hydrochlorothiazide (HYDRODIURIL) 25 MG tablet [id number] Yes TAKE 1 TABLET EVERY DAY Tower, [name], MD Taking Active    Lancets [facility] ULTRASOFT) lancets [id number] Yes Use as instructed Philemon Kingdom, MD Taking Active   lisinopril (ZESTRIL) 10 MG tablet [id number] Yes TAKE 1 TABLET EVERY DAY Tower, [name], MD Taking Active   nitroGLYCERIN (NITROSTAT) 0.4 MG SL tablet [id number] Yes Place 1 tablet (0.4 mg total) under the tongue every 5 (five) minutes as needed for chest pain. Richardson Dopp T, PA-C Taking Active Self  omeprazole (PRILOSEC) 20 MG capsule [id number] Yes TAKE 1 CAPSULE EVERY DAY Tower, [name], MD Taking Active   PARoxetine (PAXIL) 10 MG tablet [id number] Yes TAKE 1/2 TABLET EVERY DAY Tower, [name], MD Taking Active   rosuvastatin (CRESTOR) 10 MG tablet [id number] Yes TAKE 1 TABLET ON MONDAY AND THURSDAY Tower, [name], MD Taking Active           Patient Active Problem List   Diagnosis Date Noted  . Memory loss 11/24/2020  . Right hip pain 04/12/2020  . Urine color appears bright 03/28/2020  . Chest wall pain 03/28/2020  . Fall (on) (from) other stairs and steps, initial encounter 03/28/2020  . Medicare annual wellness visit, subsequent 05/06/2019  . Cerumen impaction 12/13/2017  . Facial paresthesia 12/02/2017  . Palpitations 04/02/2017  . Atherosclerotic heart disease of native coronary artery with other forms of angina pectoris ([place]) 05/23/2016  . Abnormal stress test 12/29/2015  . Type 2 diabetes mellitus with circulatory disorder, without long-term current use of insulin ([place]) 06/17/2015  . Routine general medical examination at a health care facility 06/14/2015  . Hearing loss of aging 06/14/2015  . 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 Chronic neck and back pain 06/15/2014  . Vitreomacular adhesion 05/05/2013  . Unspecified asthma(493.90) 04/23/2013  . Personal history of colonic polyps 03/03/2013  . Post-menopausal 10/17/2012  . Class 1 obesity without serious comorbidity with body mass index (BMI) of 33.0 to 33.9 in adult 10/31/2011  .  IBS (irritable bowel syndrome)  08/10/2011  . GERD 07/31/2010  . Allergic rhinitis 12/05/2007  . HYPERTENSION, BENIGN ESSENTIAL 02/05/2007  . TOBACCO USE, QUIT 02/05/2007  . Hyperlipidemia associated with type 2 diabetes mellitus ([place]) 01/31/2007  . Anxiety 01/31/2007  . Osteoarthritis 01/31/2007  . DIVERTICULITIS, HX OF 01/31/2007    Immunization History  Administered Date(s) Administered  . Influenza Split 05/02/2011  . Influenza Whole 08/20/2004, 09/18/2007, 06/21/2008, 06/22/2009, 06/19/2010  . Influenza,inj,Quad PF,6+ Mos 07/10/2013, 06/03/2015, 05/09/2016  . Pneumococcal Conjugate-13 06/14/2015  . Pneumococcal Polysaccharide-23 06/15/2003, 04/20/2008  . Td 07/06/2003  . Zoster 06/12/2014    Conditions to be addressed/monitored:  {USCCMDZASSESSMENTOPTIONS:23563}  There are no care plans that you recently modified to display for this patient.   Current Barriers:  . {pharmacybarriers:24917}  Pharmacist Clinical Goal(s):  Marland Kitchen Patient will {PHARMACYGOALCHOICES:24921} through collaboration with PharmD and provider.   Interventions: . 1:1 collaboration with Tower, [name], MD regarding development and update of comprehensive plan of care as evidenced by provider attestation and co-signature . Inter-disciplinary care team collaboration (see longitudinal plan of care) . Comprehensive medication review performed; medication list updated in electronic medical record  {CCM PHARMD DISEASE STATES:25130}  Patient Goals/Self-Care Activities . Patient will:  - {pharmacypatientgoals:24919}  Follow Up Plan: {CM FOLLOW UP BHAL:93790}   Medication Assistance: {MEDASSISTANCEINFO:25044}  Patient's preferred pharmacy is:  [facility], [address] [place] [address] Phone: [phone] Fax: [phone]  Uses pill box? {Yes or If no, why not?:20788} Pt endorses ***% compliance  We discussed: {Pharmacy options:24294} Patient decided to: {US  Pharmacy Plan:23885}  Care Plan and Follow Up Patient Decision:  {FOLLOWUP:24991}  Plan: {CM FOLLOW UP PLAN:25073}  ***

## 2021-01-21 ENCOUNTER — Other Ambulatory Visit: Payer: Self-pay | Admitting: Family Medicine

## 2021-01-30 ENCOUNTER — Ambulatory Visit: Payer: Medicare HMO | Admitting: Family Medicine

## 2021-01-30 DIAGNOSIS — Z0289 Encounter for other administrative examinations: Secondary | ICD-10-CM

## 2021-04-17 ENCOUNTER — Telehealth: Payer: Self-pay | Admitting: Family Medicine

## 2021-04-17 ENCOUNTER — Ambulatory Visit (INDEPENDENT_AMBULATORY_CARE_PROVIDER_SITE_OTHER): Payer: Self-pay | Admitting: Family Medicine

## 2021-04-17 ENCOUNTER — Telehealth: Payer: Self-pay | Admitting: *Deleted

## 2021-04-17 DIAGNOSIS — R413 Other amnesia: Secondary | ICD-10-CM

## 2021-04-17 MED ORDER — DONEPEZIL HCL 10 MG PO TABS: 10.0000 mg | ORAL_TABLET | Freq: Every day | ORAL | 3 refills | Status: DC

## 2021-04-17 MED ORDER — PAROXETINE HCL 10 MG PO TABS: 10.0000 mg | ORAL_TABLET | Freq: Every day | ORAL | 3 refills | Status: DC

## 2021-04-17 NOTE — Patient Instructions (Signed)
Go up on generic paxil 10 mg to one pill daily   Aricept (donepezil) 10 mg daily   I will send our social worker a message to reach out

## 2021-04-17 NOTE — Assessment & Plan Note (Signed)
This has worsened  Now angry/combative and refusing to come to doctor  Some issues with bathing  Some delusions (paranoid) but no hallucinations Most likely alzheimer's dementia Son is here needing help  Com care ref done in past-could not get pt to answer phone  Will message social work to reach out to him Will inc aricept to 10 mg qhs (watch for side eff) Also inc paxil to 10 mg daily (watch for side eff)  Plan to follow sw intervention  Likely needs memory care for safety  Suspect pt is unable to make own decisions (poor judgment)

## 2021-04-17 NOTE — Progress Notes (Signed)
   Subjective:    Patient ID: Erin Beck, female    DOB: Jan 02, 1941, 80 y.o.   MRN: [id number]  This visit occurred during the SARS-CoV-2 public health emergency.  Safety protocols were in place, including screening questions prior to the visit, additional usage of staff PPE, and extensive cleaning of exam room while observing appropriate contact time as indicated for disinfecting solutions.   HPI Pt's son is here to discuss her memory issues  [name] - lives 5 h today  Lives in [place] did not want to come in today  We have her permission to see Leeann Encinas    In the past dx with mci noting some confusion and sundowning  MMSE was 23 in April and noted stable anxiety  Aricept 5 mg qhs prescribed and discussed healthy habits for memory  She was supposed to f/u at 2 mo   Chronic care management was ordered  Anxiety  Takes xanax 0.25 to 0.5 mg daily prn  Paxil 10 mg half tab daily   Refused to come in today  Will not see the doctor   Refuses to admit she has a problem [place] after husband died  Delusional  Paranoid-thinks people are breaking into house  Accusatory  No hallucinations   He would be her power of attorney   Self harm - not intentional  Family is helping with her medicines  Unsure about refills  Not eating and wt loss is noted   Not checking blood sugar   ADLs : does not bathe well /infrequent /refuses Snacks/feeds herself but does not cook   2 sons (his brothers) live there  Some is there most of the time- occ she is alone No wandering   Has not looked at facilities yet     He talked to alzheimer's help line -helped some     Review of Systems     Objective:   Physical Exam        Assessment & Plan:   Problem List Items Addressed This Visit       Other   Memory loss - Primary    This has worsened  Now angry/combative and refusing to come to doctor  Some issues with bathing  Some delusions (paranoid) but no  hallucinations Most likely alzheimer's dementia Son is here needing help  Com care ref done in past-could not get pt to answer phone  Will message social work to reach out to him Will inc aricept to 10 mg qhs (watch for side eff) Also inc paxil to 10 mg daily (watch for side eff)  Plan to follow sw intervention  Likely needs memory care for safety  Suspect pt is unable to make own decisions (poor judgment)

## 2021-04-17 NOTE — Telephone Encounter (Signed)
-----   Message from Deirdre Peer, [place] sent at 04/17/2021  1:11 PM EDT ----- Dr  Glori Bickers,  I am not seeing an actual CCM referral in Epic so if you can place one that will process the logistics of consent, etc and an appointment with me to be scheduled by our Care Guide team member.    Thanks, Leonette Most ----- Message ----- From: Abner Greenspan, MD Sent: 04/17/2021  12:56 PM EDT To: Deirdre Peer, LCSW  I apologize if this is a repeat message but the first one vanished while I was writing it   Would you please reach out to [place] Wangelin (her son)  [phone] He is here from [facility] and we spoke today  Her condition has worsened and will likely need memory care Any resources would be appreciated Thanks !!

## 2021-04-17 NOTE — Telephone Encounter (Signed)
Referral is in  Thanks for the help   Son has DPR

## 2021-04-17 NOTE — Chronic Care Management (AMB) (Signed)
  Chronic Care Management   Note  04/17/2021 Name: Erin Beck MRN: [id number] DOB: May 19, 1941  Erin Beck is a 80 y.o. year old female who is a primary care patient of Tower, [name], MD. I reached out to Elon Jester by phone today in response to a referral sent by Ms. Scarlette Calico Boursiquot's PCP, Tower, [name], MD      Ms. Haverstick was given information about Chronic Care Management services today including:  CCM service includes personalized support from designated clinical staff supervised by her physician, including individualized plan of care and coordination with other care providers 24/7 contact phone numbers for assistance for urgent and routine care needs. Service will only be billed when office clinical staff spend 20 minutes or more in a month to coordinate care. Only one practitioner may furnish and bill the service in a calendar month. The patient may stop CCM services at any time (effective at the end of the month) by phone call to the office staff. The patient will be responsible for cost sharing (co-pay) of up to 20% of the service fee (after annual deductible is met).  Son Phebe Dettmer DPR on file verbally agreed to assistance and services provided by embedded care coordination/care management team today.  Follow up plan: Telephone appointment with care management team member scheduled for:04/20/21  [place] [facility]  Direct Dial: [phone]

## 2021-04-20 ENCOUNTER — Ambulatory Visit (INDEPENDENT_AMBULATORY_CARE_PROVIDER_SITE_OTHER): Payer: Self-pay | Admitting: *Deleted

## 2021-04-20 DIAGNOSIS — W108XXA Fall (on) (from) other stairs and steps, initial encounter: Secondary | ICD-10-CM

## 2021-04-20 DIAGNOSIS — R413 Other amnesia: Secondary | ICD-10-CM

## 2021-04-20 DIAGNOSIS — E1159 Type 2 diabetes mellitus with other circulatory complications: Secondary | ICD-10-CM

## 2021-04-20 NOTE — Chronic Care Management (AMB) (Signed)
Chronic Care Management    Clinical Social Work Note  04/20/2021 Name: Erin Beck MRN: [id number] DOB: 1940/10/18  Erin Beck is a 80 y.o. year old female who is a primary care patient of Tower, [name], MD. The CCM team was consulted to assist the patient with chronic disease management and/or care coordination needs related to: [employer]  and Level of Care Concerns.   Engaged with patient by telephone for initial visit in response to provider referral for social work chronic care management and care coordination services.   Consent to Services:  The patient was given information about Chronic Care Management services, agreed to services, and gave verbal consent prior to initiation of services.  Please see initial visit note for detailed documentation.   Patient agreed to services and consent obtained.   Assessment: Review of patient past medical history, allergies, medications, and health status, including review of relevant consultants reports was performed today as part of a comprehensive evaluation and provision of chronic care management and care coordination services.     SDOH (Social Determinants of Health) assessments and interventions performed:    Advanced Directives Status: See Care Plan for related entries.  CCM Care Plan  Allergies  Allergen Reactions   Naproxen Rash    Trouble breathing   Naproxen Sodium Rash    Trouble breathing   Aspirin     REACTION: burns stomach PT STATES SHE DOES TOLERATE THE 81 MG ASPIRIN DAILY   Atorvastatin     REACTION: muscle pain   Buspar [Buspirone Hcl]     Multiple side eff   Gabapentin Other (See Comments)    Palpitations/ dizziness    Metformin And Related     Mm aches, palpitations, SOB, worse eyesight, loss of balance, sweating, low CBGs   Metoprolol Other (See Comments)    dizziness   Norco [Hydrocodone-Acetaminophen] Other (See Comments)    Dizzy and problems remembering    Pravastatin     Leg pain    Simvastatin     REACTION: GI side eff and swelling   Sulfa Antibiotics Other (See Comments)    Severe joint pain   Sulfonamide Derivatives     REACTION: reaction not known   Zetia [Ezetimibe]     Caused dizziness    Outpatient Encounter Medications as of 04/20/2021  Medication Sig   ACCU-CHEK FASTCLIX LANCETS MISC TEST FOUR TIMES DAILY AS DIRECTED   acetaminophen (TYLENOL) 325 MG tablet Take 650 mg by mouth every 6 (six) hours as needed (pain).   albuterol (VENTOLIN HFA) 108 (90 Base) MCG/ACT inhaler Inhale 2 puffs into the lungs every 6 (six) hours as needed for wheezing or shortness of breath. Reported on 12/30/2015   ALPRAZolam (XANAX) 0.5 MG tablet Take 0.5-1 tablets (0.25-0.5 mg total) by mouth daily as needed for anxiety or sleep.   aspirin 81 MG tablet Take 81 mg by mouth daily.   Blood Glucose Monitoring Suppl (ONETOUCH VERIO) w/Device KIT 1 kit by Does not apply route as directed.   cetirizine (ZYRTEC) 10 MG tablet Take 10 mg by mouth daily as needed for allergies.   donepezil (ARICEPT) 10 MG tablet Take 1 tablet (10 mg total) by mouth at bedtime.   fluticasone (FLONASE) 50 MCG/ACT nasal spray Place 2 sprays into both nostrils daily.   glipiZIDE (GLUCOTROL XL) 5 MG 24 hr tablet TAKE 2 TABLETS EVERY DAY WITH BREAKFAST   glucose blood (ACCU-CHEK GUIDE) test strip Use as instructed to test once daily  hydrochlorothiazide (HYDRODIURIL) 25 MG tablet TAKE 1 TABLET EVERY DAY   Lancets (ONETOUCH ULTRASOFT) lancets Use as instructed   lisinopril (ZESTRIL) 10 MG tablet TAKE 1 TABLET EVERY DAY   nitroGLYCERIN (NITROSTAT) 0.4 MG SL tablet Place 1 tablet (0.4 mg total) under the tongue every 5 (five) minutes as needed for chest pain.   omeprazole (PRILOSEC) 20 MG capsule TAKE 1 CAPSULE EVERY DAY   PARoxetine (PAXIL) 10 MG tablet Take 1 tablet (10 mg total) by mouth daily.   rosuvastatin (CRESTOR) 10 MG tablet TAKE 1 TABLET ON MONDAY AND THURSDAY   No facility-administered encounter  medications on file as of 04/20/2021.    Patient Active Problem List   Diagnosis Date Noted   Dementia ([place]) 11/24/2020   Right hip pain 04/12/2020   Urine color appears bright 03/28/2020   Chest wall pain 03/28/2020   Fall (on) (from) other stairs and steps, initial encounter 03/28/2020   Medicare annual wellness visit, subsequent 05/06/2019   Cerumen impaction 12/13/2017   Facial paresthesia 12/02/2017   Palpitations 04/02/2017   Atherosclerotic heart disease of native coronary artery with other forms of angina pectoris ([place]) 05/23/2016   Abnormal stress test 12/29/2015   Type 2 diabetes mellitus with circulatory disorder, without long-term current use of insulin ([place]) 06/17/2015   Routine general medical examination at a health care facility 06/14/2015   Hearing loss of aging 06/14/2015   Osteoma of nasal sinus 06/18/2014   Chronic neck and back pain 06/15/2014   Vitreomacular adhesion 05/05/2013   Unspecified asthma(493.90) 04/23/2013   Personal history of colonic polyps 03/03/2013   Post-menopausal 10/17/2012   Class 1 obesity without serious comorbidity with body mass index (BMI) of 33.0 to 33.9 in adult 10/31/2011   IBS (irritable bowel syndrome) 08/10/2011   GERD 07/31/2010   Allergic rhinitis 12/05/2007   HYPERTENSION, BENIGN ESSENTIAL 02/05/2007   TOBACCO USE, QUIT 02/05/2007   Hyperlipidemia associated with type 2 diabetes mellitus ([place]) 01/31/2007   Anxiety 01/31/2007   Osteoarthritis 01/31/2007   DIVERTICULITIS, HX OF 01/31/2007    Conditions to be addressed/monitored: Dementia; Limited social support, Level of care concerns, Limited access to caregiver, and Cognitive Deficits  Care Plan : LCSW Plan of Care  Updates made by Deirdre Peer, LCSW since 04/20/2021 12:00 AM     Problem: Long Term care needs to ensure safety and well-being   Priority: High     Long-Range Goal: Provide support, resources and guidance for long-term care needs   Start Date:  04/20/2021  Expected End Date: 06/19/2021  This Visit's Progress: On track  Priority: High  Note:   Current barriers:    Limited social support, Level of care concerns, Limited access to caregiver, Cognitive Deficits, Memory Deficits, and Lacks knowledge of community resource:   Clinical Goals: Patient will work with  CSW   to address needs related to long term care need planning Clinical Interventions:  CSW had a lengthy initial outreach call to pt's son, Karma Greaser, who is the [place] and lives out of state.  Son reports he has 2 brothers who still live in the home with pt. Pt recently lost her husband of 79 years and per son/HCPOA she has gotten worse since. Per son, she has become more paranoid; thinking someone is stealing from her and has had more memory decline. Son is coming to take her to see PCP later this month and will discuss this further. CSW discussed with son the different options for long term care options an  consideration. At this point, he does not want to pursue placement of pt as he thinks this will be too much of a shock and further impact her overall well-being. Her sons that live with her work nights and thus she is home alone some- encouraged son to consider safety options to aide in her safety when alone; life alert, camera's,etc.  CSW also discussed the option of in-home caregivers and that this is a private pay options given she is not eligible for Medicaid and not aware of a long term care insurance policy.  CSW discussed memory care facilities; average cost, etc.  Son to discuss further with brothers and PCP and will plan to talk further with CSW 9/21. 1:1 collaboration with primary care provider regarding development and update of comprehensive plan of care as evidenced by provider attestation and co-signature Inter-disciplinary care team collaboration (see longitudinal plan of care) Assessment of needs, barriers , agencies contacted, as well as how impacting Review various  resources, discussed options and provided patient information about  Private pay options home health needs and Dementia resources and support  Solution-Focused Strategies, Active listening / Reflection utilized , Emotional Supportive Provided, Problem Solving [place] , Provided psychoeducation for mental health needs , and Caregiver stress acknowledged  Patient Goals/Self-Care Activities: Over the next 30 days Discuss treatment plans further with PCP (RX, referral for dementia, etc) - begin a notebook of services in my neighborhood or community - call 211 when I need some help - follow-up on any referrals for help I am given - think ahead to make sure my need does not become an emergency - make a note about what I need to have by the phone or take with me, like an identification card or social security number have a back-up plan - have a back-up plan - make a list of family or friends that I can call         Follow Up Plan: Appointment scheduled for SW follow up with client by phone on: 05/10/21      Eduard Clos MSW, [place] Licensed Clinical Social Worker [place] [phone]

## 2021-04-20 NOTE — Patient Instructions (Signed)
Visit Information   PATIENT GOALS:   Goals Addressed             This Visit's Progress    Find Help in My Community       Timeframe:  Long-Range Goal Priority:  High Start Date:       04/20/21                      Expected End Date:    06/19/21                  Follow Up Date 05/10/21    - begin a notebook of services in my neighborhood or community - call 211 when I need some help - follow-up on any referrals for help I am given - think ahead to make sure my need does not become an emergency - make a note about what I need to have by the phone or take with me, like an identification card or social security number have a back-up plan - have a back-up plan - make a list of family or friends that I can call    Why is this important?   Knowing how and where to find help for yourself or family in your neighborhood and community is an important skill.  You will want to take some steps to learn how.    Notes:         Consent to CCM Services: Ms. Gunnoe was given information about Chronic Care Management services including:  CCM service includes personalized support from designated clinical staff supervised by her physician, including individualized plan of care and coordination with other care providers 24/7 contact phone numbers for assistance for urgent and routine care needs. Service will only be billed when office clinical staff spend 20 minutes or more in a month to coordinate care. Only one practitioner may furnish and bill the service in a calendar month. The patient may stop CCM services at any time (effective at the end of the month) by phone call to the office staff. The patient will be responsible for cost sharing (co-pay) of up to 20% of the service fee (after annual deductible is met).  Patient agreed to services and verbal consent obtained.   The patient verbalized understanding of instructions, educational materials, and care plan provided today and declined  offer to receive copy of patient instructions, educational materials, and care plan.   Telephone follow up appointment with care management team member scheduled for:05/10/21  Eduard Clos MSW, LCSW Licensed Clinical Social Worker [place] [phone]   CLINICAL CARE PLAN: Patient Care Plan: LCSW Plan of Care     Problem Identified: Long Term care needs to ensure safety and well-being   Priority: High     Long-Range Goal: Provide support, resources and guidance for long-term care needs   Start Date: 04/20/2021  Expected End Date: 06/19/2021  This Visit's Progress: On track  Priority: High  Note:   Current barriers:    Limited social support, Level of care concerns, Limited access to caregiver, Cognitive Deficits, Memory Deficits, and Lacks knowledge of community resource:   Clinical Goals: Patient will work with  CSW   to address needs related to long term care need planning Clinical Interventions:  CSW had a lengthy initial outreach call to pt's son, Karma Greaser, who is the [place] and lives out of state.  Son reports he has 2 brothers who still live in the home with pt. Pt recently lost  her husband of 5 years and per son/HCPOA she has gotten worse since. Per son, she has become more paranoid; thinking someone is stealing from her and has had more memory decline. Son is coming to take her to see PCP later this month and will discuss this further. CSW discussed with son the different options for long term care options an consideration. At this point, he does not want to pursue placement of pt as he thinks this will be too much of a shock and further impact her overall well-being. Her sons that live with her work nights and thus she is home alone some- encouraged son to consider safety options to aide in her safety when alone; life alert, camera's,etc.  CSW also discussed the option of in-home caregivers and that this is a private pay options given she is not eligible for Medicaid and  not aware of a long term care insurance policy.  CSW discussed memory care facilities; average cost, etc.  Son to discuss further with brothers and PCP and will plan to talk further with CSW 9/21. 1:1 collaboration with primary care provider regarding development and update of comprehensive plan of care as evidenced by provider attestation and co-signature Inter-disciplinary care team collaboration (see longitudinal plan of care) Assessment of needs, barriers , agencies contacted, as well as how impacting Review various resources, discussed options and provided patient information about  Private pay options home health needs and Dementia resources and support  Solution-Focused Strategies, Active listening / Reflection utilized , Emotional Supportive Provided, Problem Solving [place] , Provided psychoeducation for mental health needs , and Caregiver stress acknowledged  Patient Goals/Self-Care Activities: Over the next 30 days Discuss treatment plans further with PCP (RX, referral for dementia, etc) - begin a notebook of services in my neighborhood or community - call 211 when I need some help - follow-up on any referrals for help I am given - think ahead to make sure my need does not become an emergency - make a note about what I need to have by the phone or take with me, like an identification card or social security number have a back-up plan - have a back-up plan - make a list of family or friends that I can call

## 2021-04-26 ENCOUNTER — Telehealth: Payer: Self-pay | Admitting: Family Medicine

## 2021-04-26 NOTE — Telephone Encounter (Signed)
Thanks for the heads up. She has dementia. Please call to re iterate to her she takes her medicines. I will cc to Eduard Clos (social work working with them)  Thanks!

## 2021-04-26 NOTE — Telephone Encounter (Signed)
Son dwayne called in and stated that Mrs. Patyk not taking her meds and will not take meds and she has it in her mind that dr tower told her to stop and needs for either dr. tower or shapale to call her and remind her to take her medication. The number to call is [phone]. And you may have to call twice due to it rings 2 times and goes to VM.

## 2021-04-27 NOTE — Telephone Encounter (Signed)
Called 3 times and no one answered so Left VM requesting pt to call the office back

## 2021-05-03 NOTE — Telephone Encounter (Signed)
Called twice and still no answer but pt has an appt to discuss this and memory with PCP on 05/09/21  FYI to PCP

## 2021-05-09 ENCOUNTER — Ambulatory Visit: Payer: Self-pay | Admitting: Family Medicine

## 2021-05-09 ENCOUNTER — Telehealth: Payer: Self-pay | Admitting: Family Medicine

## 2021-05-10 ENCOUNTER — Other Ambulatory Visit: Payer: Self-pay

## 2021-05-10 ENCOUNTER — Ambulatory Visit: Payer: Self-pay | Admitting: *Deleted

## 2021-05-10 DIAGNOSIS — W108XXA Fall (on) (from) other stairs and steps, initial encounter: Secondary | ICD-10-CM

## 2021-05-10 DIAGNOSIS — E1159 Type 2 diabetes mellitus with other circulatory complications: Secondary | ICD-10-CM

## 2021-05-10 DIAGNOSIS — F419 Anxiety disorder, unspecified: Secondary | ICD-10-CM

## 2021-05-10 DIAGNOSIS — R413 Other amnesia: Secondary | ICD-10-CM

## 2021-05-10 NOTE — Patient Instructions (Signed)
Visit Information  PATIENT GOALS:  Goals Addressed             This Visit's Progress    Find Help in My Community       Timeframe:  Long-Range Goal Priority:  High Start Date:       04/20/21                      Expected End Date:    06/19/21                  Follow Up Date 05/22/21    - begin a notebook of services in my neighborhood or community - call 211 when I need some help - follow-up on any referrals for help I am given - think ahead to make sure my need does not become an emergency - make a note about what I need to have by the phone or take with me, like an identification card or social security number have a back-up plan - have a back-up plan - make a list of family or friends that I can call    Why is this important?   Knowing how and where to find help for yourself or family in your neighborhood and community is an important skill.  You will want to take some steps to learn how.    Notes:         The patient verbalized understanding of instructions, educational materials, and care plan provided today and declined offer to receive copy of patient instructions, educational materials, and care plan.   Telephone follow up appointment with care management team member scheduled for:05/22/21  Eduard Clos MSW, LCSW Licensed Clinical Social Worker [place] [phone]

## 2021-05-10 NOTE — Chronic Care Management (AMB) (Signed)
Chronic Care Management    Clinical Social Work Note  05/10/2021 Name: Erin Beck MRN: [id number] DOB: June 15, 1941  Erin Beck is a 80 y.o. year old female who is a primary care patient of Tower, [name], MD. The CCM team was consulted to assist the patient with chronic disease management and/or care coordination needs related to: [employer] , Level of Care Concerns, and Mental Health Counseling and Resources.   Engaged with patient by telephone for follow up visit in response to provider referral for social work chronic care management and care coordination services.   Consent to Services:  The patient was given information about Chronic Care Management services, agreed to services, and gave verbal consent prior to initiation of services.  Please see initial visit note for detailed documentation.   Patient agreed to services and consent obtained.   Assessment: Review of patient past medical history, allergies, medications, and health status, including review of relevant consultants reports was performed today as part of a comprehensive evaluation and provision of chronic care management and care coordination services.     SDOH (Social Determinants of Health) assessments and interventions performed:    Advanced Directives Status: Not addressed in this encounter.  CCM Care Plan  Allergies  Allergen Reactions   Naproxen Rash    Trouble breathing   Naproxen Sodium Rash    Trouble breathing   Aspirin     REACTION: burns stomach PT STATES SHE DOES TOLERATE THE 81 MG ASPIRIN DAILY   Atorvastatin     REACTION: muscle pain   Buspar [Buspirone Hcl]     Multiple side eff   Gabapentin Other (See Comments)    Palpitations/ dizziness    Metformin And Related     Mm aches, palpitations, SOB, worse eyesight, loss of balance, sweating, low CBGs   Metoprolol Other (See Comments)    dizziness   Norco [Hydrocodone-Acetaminophen] Other (See Comments)    Dizzy and problems  remembering    Pravastatin     Leg pain   Simvastatin     REACTION: GI side eff and swelling   Sulfa Antibiotics Other (See Comments)    Severe joint pain   Sulfonamide Derivatives     REACTION: reaction not known   Zetia [Ezetimibe]     Caused dizziness    Outpatient Encounter Medications as of 05/10/2021  Medication Sig   ACCU-CHEK FASTCLIX LANCETS MISC TEST FOUR TIMES DAILY AS DIRECTED   acetaminophen (TYLENOL) 325 MG tablet Take 650 mg by mouth every 6 (six) hours as needed (pain).   albuterol (VENTOLIN HFA) 108 (90 Base) MCG/ACT inhaler Inhale 2 puffs into the lungs every 6 (six) hours as needed for wheezing or shortness of breath. Reported on 12/30/2015   ALPRAZolam (XANAX) 0.5 MG tablet Take 0.5-1 tablets (0.25-0.5 mg total) by mouth daily as needed for anxiety or sleep.   aspirin 81 MG tablet Take 81 mg by mouth daily.   Blood Glucose Monitoring Suppl (ONETOUCH VERIO) w/Device KIT 1 kit by Does not apply route as directed.   cetirizine (ZYRTEC) 10 MG tablet Take 10 mg by mouth daily as needed for allergies.   donepezil (ARICEPT) 10 MG tablet Take 1 tablet (10 mg total) by mouth at bedtime.   fluticasone (FLONASE) 50 MCG/ACT nasal spray Place 2 sprays into both nostrils daily.   glipiZIDE (GLUCOTROL XL) 5 MG 24 hr tablet TAKE 2 TABLETS EVERY DAY WITH BREAKFAST   glucose blood (ACCU-CHEK GUIDE) test strip Use as instructed to  test once daily   hydrochlorothiazide (HYDRODIURIL) 25 MG tablet TAKE 1 TABLET EVERY DAY   Lancets (ONETOUCH ULTRASOFT) lancets Use as instructed   lisinopril (ZESTRIL) 10 MG tablet TAKE 1 TABLET EVERY DAY   nitroGLYCERIN (NITROSTAT) 0.4 MG SL tablet Place 1 tablet (0.4 mg total) under the tongue every 5 (five) minutes as needed for chest pain.   omeprazole (PRILOSEC) 20 MG capsule TAKE 1 CAPSULE EVERY DAY   PARoxetine (PAXIL) 10 MG tablet Take 1 tablet (10 mg total) by mouth daily.   rosuvastatin (CRESTOR) 10 MG tablet TAKE 1 TABLET ON MONDAY AND THURSDAY    No facility-administered encounter medications on file as of 05/10/2021.    Patient Active Problem List   Diagnosis Date Noted   Dementia ([place]) 11/24/2020   Right hip pain 04/12/2020   Urine color appears bright 03/28/2020   Chest wall pain 03/28/2020   Fall (on) (from) other stairs and steps, initial encounter 03/28/2020   Medicare annual wellness visit, subsequent 05/06/2019   Cerumen impaction 12/13/2017   Facial paresthesia 12/02/2017   Palpitations 04/02/2017   Atherosclerotic heart disease of native coronary artery with other forms of angina pectoris ([place]) 05/23/2016   Abnormal stress test 12/29/2015   Type 2 diabetes mellitus with circulatory disorder, without long-term current use of insulin ([place]) 06/17/2015   Routine general medical examination at a health care facility 06/14/2015   Hearing loss of aging 06/14/2015   Osteoma of nasal sinus 06/18/2014   Chronic neck and back pain 06/15/2014   Vitreomacular adhesion 05/05/2013   Unspecified asthma(493.90) 04/23/2013   Personal history of colonic polyps 03/03/2013   Post-menopausal 10/17/2012   Class 1 obesity without serious comorbidity with body mass index (BMI) of 33.0 to 33.9 in adult 10/31/2011   IBS (irritable bowel syndrome) 08/10/2011   GERD 07/31/2010   Allergic rhinitis 12/05/2007   HYPERTENSION, BENIGN ESSENTIAL 02/05/2007   TOBACCO USE, QUIT 02/05/2007   Hyperlipidemia associated with type 2 diabetes mellitus ([place]) 01/31/2007   Anxiety 01/31/2007   Osteoarthritis 01/31/2007   DIVERTICULITIS, HX OF 01/31/2007    Conditions to be addressed/monitored: Dementia; Level of care concerns, Mental Health Concerns , Cognitive Deficits, Memory Deficits, and Lacks knowledge of community resource:    Care Plan : LCSW Plan of Care  Updates made by Deirdre Peer, LCSW since 05/10/2021 12:00 AM     Problem: Long Term care needs to ensure safety and well-being   Priority: High     Long-Range Goal: Provide  support, resources and guidance for long-term care needs   Start Date: 04/20/2021  Expected End Date: 06/19/2021  This Visit's Progress: On track  Recent Progress: On track  Priority: High  Note:   Current barriers:    Limited social support, Level of care concerns, Limited access to caregiver, Cognitive Deficits, Memory Deficits, and Lacks knowledge of community resource:   Clinical Goals: Patient will work with  CSW   to address needs related to long term care need planning Clinical Interventions:  05/10/21- CSW spoke with pt's son who reports pt refused to go to her PCP visit. "She locked herself in her room.... she won't take her meds either".  CSW talked with son about dementia and some behaviors that are sometimes seen. CSW alerted him to if ever needed, they may need to have her evaluated at a [facility].  CSW also mentioned to son that it may be possible for PCP to prescribe something to help with her mood as well as medications  that can be crushed, etc. CSW will alert PCP and Pharmacist to the above for further determination. CSW made some suggestions for family to try to get her to leave the house/go to PCP,etc as well as to "pursuade" her to take meds. Pt is able to be left home alone and does not wander or try to cook (stove).  Son also aware of calling 911 if there are ever any concerns related to her medical or safety (or other's safety).   CSW had a lengthy initial outreach call to pt's son, Karma Greaser, who is the [place] and lives out of state.  Son reports he has 2 brothers who still live in the home with pt. Pt recently lost her husband of 28 years and per son/HCPOA she has gotten worse since. Per son, she has become more paranoid; thinking someone is stealing from her and has had more memory decline. Son is coming to take her to see PCP later this month and will discuss this further. CSW discussed with son the different options for long term care options an consideration. At this point, he does not  want to pursue placement of pt as he thinks this will be too much of a shock and further impact her overall well-being. Her sons that live with her work nights and thus she is home alone some- encouraged son to consider safety options to aide in her safety when alone; life alert, camera's,etc.  CSW also discussed the option of in-home caregivers and that this is a private pay options given she is not eligible for Medicaid and not aware of a long term care insurance policy.  CSW discussed memory care facilities; average cost, etc.  Son to discuss further with brothers and PCP and will plan to talk further with CSW 9/21. 1:1 collaboration with primary care provider regarding development and update of comprehensive plan of care as evidenced by provider attestation and co-signature Inter-disciplinary care team collaboration (see longitudinal plan of care) Assessment of needs, barriers , agencies contacted, as well as how impacting Review various resources, discussed options and provided patient information about  Private pay options home health needs and Dementia resources and support  Solution-Focused Strategies, Active listening / Reflection utilized , Emotional Supportive Provided, Problem Solving [place] , Provided psychoeducation for mental health needs , and Caregiver stress acknowledged  Patient Goals/Self-Care Activities: Over the next 30 days Discuss treatment plans further with PCP (RX, referral for dementia, etc) - begin a notebook of services in my neighborhood or community - call 211 when I need some help - follow-up on any referrals for help I am given - think ahead to make sure my need does not become an emergency - make a note about what I need to have by the phone or take with me, like an identification card or social security number have a back-up plan - have a back-up plan - make a list of family or friends that I can call         Follow Up Plan: Appointment scheduled for SW  follow up with client by phone on: 05/22/21      Eduard Clos MSW, [place] Licensed Clinical Social Worker [place] [phone]

## 2021-05-18 NOTE — Progress Notes (Signed)
I apologize for the delay.  All can be crushed except omeprazole and glipizide XL. The omeprazole can be opened and contents mixed with 1 tablespoon of applesauce (soft enough to swallow without chewing). I would recommend switching the glipizide to IR formulation and then it can be crushed without concerns.   Debbora Dus, PharmD Clinical Pharmacist [facility] at [facility] [phone]

## 2021-05-21 ENCOUNTER — Other Ambulatory Visit: Payer: Self-pay | Admitting: Family Medicine

## 2021-05-21 NOTE — Telephone Encounter (Signed)
-----   Message from Deirdre Peer, [place] sent at 05/10/2021 10:53 AM EDT ----- All- I am concerned about pt's behaviors and what seems to be the beginning of challenges for family.  I am wondering if yall have recommendations to make and/or changes that can be made- primarily around her RX's. See my note for more details. Thanks, Eduard Clos MSW, LCSW Licensed Clinical Social Worker [place] [phone]

## 2021-05-21 NOTE — Telephone Encounter (Signed)
In light of difficulty getting pt to take medicines, Debbora Dus (pharmacist) noted:  I apologize for the delay.   All can be crushed except omeprazole and glipizide XL. The omeprazole can be opened and contents mixed with 1 tablespoon of applesauce (soft enough to swallow without chewing). I would recommend switching the glipizide to IR formulation and then it can be crushed without concerns.  Thanks for the input!  She takes glipizide XL 5 mg daily -is this equivalent to 5 mg IR bid or 2.5 bid? Want to double check, thanks

## 2021-05-22 ENCOUNTER — Telehealth: Payer: Self-pay

## 2021-05-22 MED ORDER — GLIPIZIDE 5 MG PO TABS: 2.5000 mg | ORAL_TABLET | Freq: Two times a day (BID) | ORAL | 5 refills | Status: DC

## 2021-05-22 NOTE — Telephone Encounter (Signed)
Please talk to her caregiver/son  Please change from glipizide XL 5 mg daily to glipizide (not XL) 1/2 tab bid can be crushed and put into food or drink  I pended the new med to go to pharmacy of choice  See Michelle's notation about omeprazole, the cap can be opneed and put into applesauce   Since she does not want to take her medications- these are adjustments that can be made   Thanks  Tough situation, social work has been corresponding with her son

## 2021-05-23 ENCOUNTER — Telehealth: Payer: Self-pay | Admitting: *Deleted

## 2021-05-23 NOTE — Telephone Encounter (Signed)
  Care Management   Follow Up Note   05/23/2021 Name: Erin Beck MRN: [id number] DOB: 07-30-1941   Referred by: Tower, [name], MD Reason for referral : No chief complaint on file.   An unsuccessful telephone outreach was attempted today. The patient was referred to the case management team for assistance with care management and care coordination.   Follow Up Plan: The care management team will reach out to the patient again over the next 7-10 days.   Eduard Clos MSW, LCSW Licensed Clinical Social Worker [place] [phone]

## 2021-06-06 ENCOUNTER — Ambulatory Visit (INDEPENDENT_AMBULATORY_CARE_PROVIDER_SITE_OTHER): Payer: Self-pay | Admitting: *Deleted

## 2021-06-06 DIAGNOSIS — F419 Anxiety disorder, unspecified: Secondary | ICD-10-CM

## 2021-06-06 DIAGNOSIS — R413 Other amnesia: Secondary | ICD-10-CM

## 2021-06-06 DIAGNOSIS — E1159 Type 2 diabetes mellitus with other circulatory complications: Secondary | ICD-10-CM

## 2021-06-06 NOTE — Patient Instructions (Signed)
Visit Information  PATIENT GOALS:  Goals Addressed             This Visit's Progress    Find Help in My Community       Timeframe:  Long-Range Goal Priority:  High Start Date:       04/20/21                      Expected End Date:    08/19/21                  Follow Up Date 07/03/21   -expect outreach call from PCP office re: meds - begin a notebook of services in my neighborhood or community - call 211 when I need some help - follow-up on any referrals for help I am given - think ahead to make sure my need does not become an emergency - make a note about what I need to have by the phone or take with me, like an identification card or social security number have a back-up plan - have a back-up plan - make a list of family or friends that I can call    Why is this important?   Knowing how and where to find help for yourself or family in your neighborhood and community is an important skill.  You will want to take some steps to learn how.    Notes:         The patient verbalized understanding of instructions, educational materials, and care plan provided today and declined offer to receive copy of patient instructions, educational materials, and care plan.   Telephone follow up appointment with care management team member scheduled for: 07/03/21 Eduard Clos MSW, LCSW Licensed Clinical Social Worker [place] [phone]

## 2021-06-06 NOTE — Chronic Care Management (AMB) (Signed)
Chronic Care Management    Clinical Social Work Note  06/06/2021 Name: Erin Beck MRN: [id number] DOB: April 26, 1941  Erin Beck is a 80 y.o. year old female who is a primary care patient of Tower, [name], MD. The CCM team was consulted to assist the patient with chronic disease management and/or care coordination needs related to: [employer] , Level of Care Concerns, and Caregiver Stress.   Engaged with patient by telephone for follow up visit in response to provider referral for social work chronic care management and care coordination services.   Consent to Services:  The patient was given information about Chronic Care Management services, agreed to services, and gave verbal consent prior to initiation of services.  Please see initial visit note for detailed documentation.   Patient agreed to services and consent obtained.   Assessment: Review of patient past medical history, allergies, medications, and health status, including review of relevant consultants reports was performed today as part of a comprehensive evaluation and provision of chronic care management and care coordination services.     SDOH (Social Determinants of Health) assessments and interventions performed:    Advanced Directives Status: Not addressed in this encounter.  CCM Care Plan  Allergies  Allergen Reactions   Naproxen Rash    Trouble breathing   Naproxen Sodium Rash    Trouble breathing   Aspirin     REACTION: burns stomach PT STATES SHE DOES TOLERATE THE 81 MG ASPIRIN DAILY   Atorvastatin     REACTION: muscle pain   Buspar [Buspirone Hcl]     Multiple side eff   Gabapentin Other (See Comments)    Palpitations/ dizziness    Metformin And Related     Mm aches, palpitations, SOB, worse eyesight, loss of balance, sweating, low CBGs   Metoprolol Other (See Comments)    dizziness   Norco [Hydrocodone-Acetaminophen] Other (See Comments)    Dizzy and problems remembering     Pravastatin     Leg pain   Simvastatin     REACTION: GI side eff and swelling   Sulfa Antibiotics Other (See Comments)    Severe joint pain   Sulfonamide Derivatives     REACTION: reaction not known   Zetia [Ezetimibe]     Caused dizziness    Outpatient Encounter Medications as of 06/06/2021  Medication Sig   ACCU-CHEK FASTCLIX LANCETS MISC TEST FOUR TIMES DAILY AS DIRECTED   acetaminophen (TYLENOL) 325 MG tablet Take 650 mg by mouth every 6 (six) hours as needed (pain).   albuterol (VENTOLIN HFA) 108 (90 Base) MCG/ACT inhaler Inhale 2 puffs into the lungs every 6 (six) hours as needed for wheezing or shortness of breath. Reported on 12/30/2015   ALPRAZolam (XANAX) 0.5 MG tablet Take 0.5-1 tablets (0.25-0.5 mg total) by mouth daily as needed for anxiety or sleep.   aspirin 81 MG tablet Take 81 mg by mouth daily.   Blood Glucose Monitoring Suppl (ONETOUCH VERIO) w/Device KIT 1 kit by Does not apply route as directed.   cetirizine (ZYRTEC) 10 MG tablet Take 10 mg by mouth daily as needed for allergies.   donepezil (ARICEPT) 10 MG tablet Take 1 tablet (10 mg total) by mouth at bedtime.   fluticasone (FLONASE) 50 MCG/ACT nasal spray Place 2 sprays into both nostrils daily.   glipiZIDE (GLUCOTROL) 5 MG tablet Take 0.5 tablets (2.5 mg total) by mouth 2 (two) times daily before a meal.   glucose blood (ACCU-CHEK GUIDE) test strip Use as  instructed to test once daily   hydrochlorothiazide (HYDRODIURIL) 25 MG tablet TAKE 1 TABLET EVERY DAY   Lancets (ONETOUCH ULTRASOFT) lancets Use as instructed   lisinopril (ZESTRIL) 10 MG tablet TAKE 1 TABLET EVERY DAY   nitroGLYCERIN (NITROSTAT) 0.4 MG SL tablet Place 1 tablet (0.4 mg total) under the tongue every 5 (five) minutes as needed for chest pain.   omeprazole (PRILOSEC) 20 MG capsule TAKE 1 CAPSULE EVERY DAY   PARoxetine (PAXIL) 10 MG tablet Take 1 tablet (10 mg total) by mouth daily.   rosuvastatin (CRESTOR) 10 MG tablet TAKE 1 TABLET ON MONDAY AND  THURSDAY   No facility-administered encounter medications on file as of 06/06/2021.    Patient Active Problem List   Diagnosis Date Noted   Dementia ([place]) 11/24/2020   Right hip pain 04/12/2020   Urine color appears bright 03/28/2020   Chest wall pain 03/28/2020   Fall (on) (from) other stairs and steps, initial encounter 03/28/2020   Medicare annual wellness visit, subsequent 05/06/2019   Cerumen impaction 12/13/2017   Facial paresthesia 12/02/2017   Palpitations 04/02/2017   Atherosclerotic heart disease of native coronary artery with other forms of angina pectoris ([place]) 05/23/2016   Abnormal stress test 12/29/2015   Type 2 diabetes mellitus with circulatory disorder, without long-term current use of insulin ([place]) 06/17/2015   Routine general medical examination at a health care facility 06/14/2015   Hearing loss of aging 06/14/2015   Osteoma of nasal sinus 06/18/2014   Chronic neck and back pain 06/15/2014   Vitreomacular adhesion 05/05/2013   Unspecified asthma(493.90) 04/23/2013   Personal history of colonic polyps 03/03/2013   Post-menopausal 10/17/2012   Class 1 obesity without serious comorbidity with body mass index (BMI) of 33.0 to 33.9 in adult 10/31/2011   IBS (irritable bowel syndrome) 08/10/2011   GERD 07/31/2010   Allergic rhinitis 12/05/2007   HYPERTENSION, BENIGN ESSENTIAL 02/05/2007   TOBACCO USE, QUIT 02/05/2007   Hyperlipidemia associated with type 2 diabetes mellitus ([place]) 01/31/2007   Anxiety 01/31/2007   Osteoarthritis 01/31/2007   DIVERTICULITIS, HX OF 01/31/2007    Conditions to be addressed/monitored: Dementia; Limited social support, Level of care concerns, Limited access to caregiver, and Lacks knowledge of community resource:    Care Plan : LCSW Plan of Care  Updates made by Deirdre Peer, LCSW since 06/06/2021 12:00 AM     Problem: Long Term care needs to ensure safety and well-being   Priority: High     Long-Range Goal: Provide  support, resources and guidance for long-term care needs   Start Date: 04/20/2021  Expected End Date: 08/19/2021  This Visit's Progress: On track  Recent Progress: On track  Priority: High  Note:   Current barriers:    Limited social support, Level of care concerns, Limited access to caregiver, Cognitive Deficits, Memory Deficits, and Lacks knowledge of community resource:   Clinical Goals: Patient will work with  CSW   to address needs related to long term care need planning Clinical Interventions:  06/06/21- CSW spoke with pt's son who reports pt continues to refuse to take medications or to go to her PCP visit. "She locked herself in her room.... ".  CSW will reach out to PCP and Pharmacist to inquire about possible options to aide in her behaviors- refusal/irritability, etc that may be able to be "taken" in alternative ways since she won't take pills.  Son reports he is coming next Monday and she has been asking to go see the lawyer  who helped her  in the past- son hopes to pursue Guardianship.     CSW had a lengthy initial outreach call to pt's son, Karma Greaser, who is the [place] and lives out of state.  Son reports he has 2 brothers who still live in the home with pt. Pt recently lost her husband of 41 years and per son/HCPOA she has gotten worse since. Per son, she has become more paranoid; thinking someone is stealing from her and has had more memory decline. Son is coming to take her to see PCP later this month and will discuss this further. CSW discussed with son the different options for long term care options an consideration. At this point, he does not want to pursue placement of pt as he thinks this will be too much of a shock and further impact her overall well-being. Her sons that live with her work nights and thus she is home alone some- encouraged son to consider safety options to aide in her safety when alone; life alert, camera's,etc.  CSW also discussed the option of in-home caregivers  and that this is a private pay options given she is not eligible for Medicaid and not aware of a long term care insurance policy.  CSW discussed memory care facilities; average cost, etc.  Son to discuss further with brothers and PCP and will plan to talk further with CSW 9/21. 1:1 collaboration with primary care provider regarding development and update of comprehensive plan of care as evidenced by provider attestation and co-signature Inter-disciplinary care team collaboration (see longitudinal plan of care) Assessment of needs, barriers , agencies contacted, as well as how impacting Review various resources, discussed options and provided patient information about  Private pay options home health needs and Dementia resources and support  Solution-Focused Strategies, Active listening / Reflection utilized , Emotional Supportive Provided, Problem Solving [place] , Provided psychoeducation for mental health needs , and Caregiver stress acknowledged  Patient Goals/Self-Care Activities: Over the next 30 days Discuss treatment plans further with PCP (RX, referral for dementia, etc) Meet with Attorney as planned and discussed - begin a notebook of services in my neighborhood or community - call 211 when I need some help - follow-up on any referrals for help I am given - think ahead to make sure my need does not become an emergency - make a note about what I need to have by the phone or take with me, like an identification card or social security number have a back-up plan - have a back-up plan - make a list of family or friends that I can call         Follow Up Plan: Appointment scheduled for SW follow up with client by phone on: 07/03/21      Eduard Clos MSW, [place] Licensed Clinical Social Worker [place]

## 2021-06-08 ENCOUNTER — Telehealth: Payer: Self-pay | Admitting: Family Medicine

## 2021-06-08 NOTE — Telephone Encounter (Signed)
Noted from pharmacy team:  Charlton Haws, [facility]  Areyanna Figeroa, [name], MD; Debbora Dus, [facility]  I looked into this ABH gel, the literature does not really support it's use because the drugs are not absorbed well through the skin and do not reach therapeutic levels. Some small case studies/reports have shown very minor improvements in N/V and terminal delirium in hospice/palliative patients.   If we want to give it a shot, [facility] in [place] will compound it (this was the closest pharmacy I could find to patient's address). Their pharmacist recommended lorazepam 1 mg, Benadryl 25 mg, and Haloperidol 1 mg per mL to start with, instructions to apply 1 mL to wrists 2-3 times daily. The cost would be $55-60.   There aren't a lot of great options, but here are some alternatives to consider:  -the simplest method may be crushing her tablets and putting it in food/drink. There are also liquid formulations of most SSRIs (paroxetine and sertraline liquids should be commercially available at any pharmacy) - I'm not sure if the patient would be more open to drinking something rather than taking a pill?  -Doxepin comes in a topical cream used to treat itching, it has not been studies to treat depression/agitation but could be an option.  -ODT options could be beneficial if patient feels like she doesn't want to swallow something? Mirtazapine and risperidone have ODT formulations    I hope this helps, let us know if you need any more info.  Mendel Ryder     Please let son know:   we can try the ABH gel, (may be helpful, not entirely sure)  We could also try a liquid or orally disintegrating antidepressant  Would she take something mixed /hidden in food or drink?

## 2021-06-12 NOTE — Telephone Encounter (Signed)
Tried calling the patient and he did not answer. LVM for patient to call back.   

## 2021-06-22 NOTE — Telephone Encounter (Signed)
Left VM requesting pt's family to call the office back

## 2021-07-03 ENCOUNTER — Telehealth: Payer: Self-pay

## 2021-07-04 NOTE — Telephone Encounter (Signed)
Left VM requesting pt to call the office back 

## 2021-10-12 ENCOUNTER — Emergency Department (HOSPITAL_COMMUNITY): Payer: Medicare Other

## 2021-10-12 ENCOUNTER — Inpatient Hospital Stay (HOSPITAL_COMMUNITY)
Admission: EM | Admit: 2021-10-12 | Discharge: 2021-10-17 | DRG: 552 | Disposition: A | Discharge status: Hospice | Payer: Medicare Other | Attending: Internal Medicine | Admitting: Internal Medicine

## 2021-10-12 ENCOUNTER — Encounter (HOSPITAL_COMMUNITY): Payer: Self-pay

## 2021-10-12 ENCOUNTER — Other Ambulatory Visit: Payer: Self-pay

## 2021-10-12 DIAGNOSIS — W109XXA Fall (on) (from) unspecified stairs and steps, initial encounter: Secondary | ICD-10-CM | POA: Diagnosis present

## 2021-10-12 DIAGNOSIS — Z515 Encounter for palliative care: Secondary | ICD-10-CM

## 2021-10-12 DIAGNOSIS — R739 Hyperglycemia, unspecified: Secondary | ICD-10-CM

## 2021-10-12 DIAGNOSIS — F03C18 Unspecified dementia, severe, with other behavioral disturbance: Secondary | ICD-10-CM

## 2021-10-12 DIAGNOSIS — Z8249 Family history of ischemic heart disease and other diseases of the circulatory system: Secondary | ICD-10-CM

## 2021-10-12 DIAGNOSIS — E1165 Type 2 diabetes mellitus with hyperglycemia: Secondary | ICD-10-CM

## 2021-10-12 DIAGNOSIS — D75839 Thrombocytosis, unspecified: Secondary | ICD-10-CM

## 2021-10-12 DIAGNOSIS — Z79899 Other long term (current) drug therapy: Secondary | ICD-10-CM

## 2021-10-12 DIAGNOSIS — D72829 Elevated white blood cell count, unspecified: Secondary | ICD-10-CM

## 2021-10-12 DIAGNOSIS — S129XXA Fracture of neck, unspecified, initial encounter: Secondary | ICD-10-CM

## 2021-10-12 DIAGNOSIS — E785 Hyperlipidemia, unspecified: Secondary | ICD-10-CM | POA: Diagnosis present

## 2021-10-12 DIAGNOSIS — Z87891 Personal history of nicotine dependence: Secondary | ICD-10-CM

## 2021-10-12 DIAGNOSIS — Z7982 Long term (current) use of aspirin: Secondary | ICD-10-CM

## 2021-10-12 DIAGNOSIS — Z20822 Contact with and (suspected) exposure to covid-19: Secondary | ICD-10-CM | POA: Diagnosis present

## 2021-10-12 DIAGNOSIS — Z23 Encounter for immunization: Secondary | ICD-10-CM

## 2021-10-12 DIAGNOSIS — S12030A Displaced posterior arch fracture of first cervical vertebra, initial encounter for closed fracture: Secondary | ICD-10-CM | POA: Diagnosis not present

## 2021-10-12 DIAGNOSIS — Z9841 Cataract extraction status, right eye: Secondary | ICD-10-CM

## 2021-10-12 DIAGNOSIS — I10 Essential (primary) hypertension: Secondary | ICD-10-CM

## 2021-10-12 DIAGNOSIS — E871 Hypo-osmolality and hyponatremia: Secondary | ICD-10-CM

## 2021-10-12 DIAGNOSIS — F0394 Unspecified dementia, unspecified severity, with anxiety: Secondary | ICD-10-CM | POA: Diagnosis present

## 2021-10-12 DIAGNOSIS — Z882 Allergy status to sulfonamides status: Secondary | ICD-10-CM

## 2021-10-12 DIAGNOSIS — K219 Gastro-esophageal reflux disease without esophagitis: Secondary | ICD-10-CM | POA: Diagnosis present

## 2021-10-12 DIAGNOSIS — W108XXA Fall (on) (from) other stairs and steps, initial encounter: Secondary | ICD-10-CM

## 2021-10-12 DIAGNOSIS — Z833 Family history of diabetes mellitus: Secondary | ICD-10-CM

## 2021-10-12 DIAGNOSIS — F0390 Unspecified dementia without behavioral disturbance: Secondary | ICD-10-CM | POA: Diagnosis present

## 2021-10-12 DIAGNOSIS — Z66 Do not resuscitate: Secondary | ICD-10-CM | POA: Diagnosis not present

## 2021-10-12 DIAGNOSIS — W19XXXA Unspecified fall, initial encounter: Principal | ICD-10-CM

## 2021-10-12 DIAGNOSIS — S0083XA Contusion of other part of head, initial encounter: Secondary | ICD-10-CM | POA: Diagnosis present

## 2021-10-12 DIAGNOSIS — Z888 Allergy status to other drugs, medicaments and biological substances status: Secondary | ICD-10-CM

## 2021-10-12 DIAGNOSIS — R9431 Abnormal electrocardiogram [ECG] [EKG]: Secondary | ICD-10-CM

## 2021-10-12 DIAGNOSIS — I2510 Atherosclerotic heart disease of native coronary artery without angina pectoris: Secondary | ICD-10-CM | POA: Diagnosis present

## 2021-10-12 DIAGNOSIS — Z96653 Presence of artificial knee joint, bilateral: Secondary | ICD-10-CM | POA: Diagnosis present

## 2021-10-12 DIAGNOSIS — Z9842 Cataract extraction status, left eye: Secondary | ICD-10-CM

## 2021-10-12 DIAGNOSIS — Y92009 Unspecified place in unspecified non-institutional (private) residence as the place of occurrence of the external cause: Secondary | ICD-10-CM

## 2021-10-12 DIAGNOSIS — S12110A Anterior displaced Type II dens fracture, initial encounter for closed fracture: Secondary | ICD-10-CM | POA: Diagnosis present

## 2021-10-12 LAB — CBC WITH DIFFERENTIAL/PLATELET
Abs Immature Granulocytes: 0.12 10*3/uL — ABNORMAL HIGH (ref 0.00–0.07)
Basophils Absolute: 0.1 10*3/uL (ref 0.0–0.1)
Basophils Relative: 0 %
Eosinophils Absolute: 0 10*3/uL (ref 0.0–0.5)
Eosinophils Relative: 0 %
HCT: 44.8 % (ref 36.0–46.0)
Hemoglobin: 15.3 g/dL — ABNORMAL HIGH (ref 12.0–15.0)
Immature Granulocytes: 1 %
Lymphocytes Relative: 4 %
Lymphs Abs: 0.7 10*3/uL (ref 0.7–4.0)
MCH: 31.5 pg (ref 26.0–34.0)
MCHC: 34.2 g/dL (ref 30.0–36.0)
MCV: 92.4 fL (ref 80.0–100.0)
Monocytes Absolute: 0.9 10*3/uL (ref 0.1–1.0)
Monocytes Relative: 5 %
Neutro Abs: 17.4 10*3/uL — ABNORMAL HIGH (ref 1.7–7.7)
Neutrophils Relative %: 90 %
Platelets: 595 10*3/uL — ABNORMAL HIGH (ref 150–400)
RBC: 4.85 MIL/uL (ref 3.87–5.11)
RDW: 12.1 % (ref 11.5–15.5)
WBC: 19.3 10*3/uL — ABNORMAL HIGH (ref 4.0–10.5)
nRBC: 0 % (ref 0.0–0.2)

## 2021-10-12 LAB — COMPREHENSIVE METABOLIC PANEL
ALT: 17 U/L (ref 0–44)
AST: 22 U/L (ref 15–41)
Albumin: 3.9 g/dL (ref 3.5–5.0)
Alkaline Phosphatase: 67 U/L (ref 38–126)
Anion gap: 11 (ref 5–15)
BUN: 16 mg/dL (ref 8–23)
CO2: 24 mmol/L (ref 22–32)
Calcium: 9.2 mg/dL (ref 8.9–10.3)
Chloride: 94 mmol/L — ABNORMAL LOW (ref 98–111)
Creatinine, Ser: 0.8 mg/dL (ref 0.44–1.00)
GFR, Estimated: >60 mL/min (ref >60)
Glucose, Bld: 517 mg/dL (ref 70–99)
Potassium: 4.2 mmol/L (ref 3.5–5.1)
Sodium: 129 mmol/L — ABNORMAL LOW (ref 135–145)
Total Bilirubin: 0.7 mg/dL (ref 0.3–1.2)
Total Protein: 6.7 g/dL (ref 6.5–8.1)

## 2021-10-12 LAB — OSMOLALITY: Osmolality: 291 mOsm/kg (ref 275–295)

## 2021-10-12 LAB — RESP PANEL BY RT-PCR (FLU A&B, COVID) ARPGX2
Influenza A by PCR: NEGATIVE
Influenza B by PCR: NEGATIVE
SARS Coronavirus 2 by RT PCR: NEGATIVE

## 2021-10-12 LAB — URINALYSIS, ROUTINE W REFLEX MICROSCOPIC
Bacteria, UA: NONE SEEN
Bilirubin Urine: NEGATIVE
Glucose, UA: >=500 mg/dL — AB
Hgb urine dipstick: NEGATIVE
Ketones, ur: 5 mg/dL — AB
Leukocytes,Ua: NEGATIVE
Nitrite: NEGATIVE
Protein, ur: NEGATIVE mg/dL
Specific Gravity, Urine: 1.032 — ABNORMAL HIGH (ref 1.005–1.030)
pH: 7 (ref 5.0–8.0)

## 2021-10-12 LAB — CBG MONITORING, ED
Glucose-Capillary: 459 mg/dL — ABNORMAL HIGH (ref 70–99)
Glucose-Capillary: 249 mg/dL — ABNORMAL HIGH (ref 70–99)

## 2021-10-12 LAB — TROPONIN I (HIGH SENSITIVITY)
Troponin I (High Sensitivity): 5 ng/L (ref <18)
Troponin I (High Sensitivity): 4 ng/L (ref <18)

## 2021-10-12 LAB — CK: Total CK: 60 U/L (ref 38–234)

## 2021-10-12 LAB — BETA-HYDROXYBUTYRIC ACID: Beta-Hydroxybutyric Acid: 0.24 mmol/L (ref 0.05–0.27)

## 2021-10-12 LAB — GLUCOSE, CAPILLARY: Glucose-Capillary: 301 mg/dL — ABNORMAL HIGH (ref 70–99)

## 2021-10-12 MED ORDER — HYDRALAZINE HCL 20 MG/ML IJ SOLN: 10.0000 mg | INTRAMUSCULAR | Status: DC | PRN

## 2021-10-12 MED ORDER — COVID-19 MRNA VAC-TRIS(PFIZER) 30 MCG/0.3ML IM SUSP
0.3000 mL | Freq: Once | INTRAMUSCULAR | Status: AC
  Administered 2021-10-13: 0.3 mL via INTRAMUSCULAR
  Filled 2021-10-12: qty 0.3

## 2021-10-12 MED ORDER — INSULIN ASPART 100 UNIT/ML IJ SOLN
0.0000 [IU] | Freq: Every day | INTRAMUSCULAR | Status: DC
  Administered 2021-10-12: 4 [IU] via SUBCUTANEOUS

## 2021-10-12 MED ORDER — LORAZEPAM 0.5 MG PO TABS
0.5000 mg | ORAL_TABLET | Freq: Once | ORAL | Status: AC
  Administered 2021-10-12: 0.5 mg via ORAL
  Filled 2021-10-12: qty 1

## 2021-10-12 MED ORDER — ACETAMINOPHEN 325 MG PO TABS
650.0000 mg | ORAL_TABLET | Freq: Four times a day (QID) | ORAL | Status: DC | PRN
  Administered 2021-10-13: 650 mg via ORAL
  Filled 2021-10-12: qty 2

## 2021-10-12 MED ORDER — ACETAMINOPHEN 650 MG RE SUPP: 650.0000 mg | Freq: Four times a day (QID) | RECTAL | Status: DC | PRN

## 2021-10-12 MED ORDER — LORAZEPAM 2 MG/ML IJ SOLN
0.5000 mg | INTRAMUSCULAR | Status: DC | PRN
  Administered 2021-10-12 – 2021-10-13 (×3): 0.5 mg via INTRAVENOUS
  Filled 2021-10-12 (×3): qty 1

## 2021-10-12 MED ORDER — SODIUM CHLORIDE 0.9% FLUSH
3.0000 mL | Freq: Two times a day (BID) | INTRAVENOUS | Status: DC
  Administered 2021-10-13 – 2021-10-14 (×2): 3 mL via INTRAVENOUS

## 2021-10-12 MED ORDER — INFLUENZA VAC A&B SA ADJ QUAD 0.5 ML IM PRSY
0.5000 mL | PREFILLED_SYRINGE | INTRAMUSCULAR | Status: AC
  Administered 2021-10-13: 0.5 mL via INTRAMUSCULAR
  Filled 2021-10-12: qty 0.5

## 2021-10-12 MED ORDER — ENSURE ENLIVE PO LIQD
237.0000 mL | Freq: Two times a day (BID) | ORAL | Status: DC
  Administered 2021-10-13 – 2021-10-14 (×4): 237 mL via ORAL

## 2021-10-12 MED ORDER — TRAMADOL HCL 50 MG PO TABS
50.0000 mg | ORAL_TABLET | Freq: Four times a day (QID) | ORAL | Status: DC | PRN
  Administered 2021-10-12: 50 mg via ORAL
  Filled 2021-10-12: qty 1

## 2021-10-12 MED ORDER — LACTATED RINGERS IV BOLUS
1000.0000 mL | Freq: Once | INTRAVENOUS | Status: AC
  Administered 2021-10-12: 1000 mL via INTRAVENOUS

## 2021-10-12 MED ORDER — ENOXAPARIN SODIUM 40 MG/0.4ML IJ SOSY
40.0000 mg | PREFILLED_SYRINGE | INTRAMUSCULAR | Status: DC
  Administered 2021-10-12 – 2021-10-14 (×3): 40 mg via SUBCUTANEOUS
  Filled 2021-10-12 (×3): qty 0.4

## 2021-10-12 MED ORDER — ONDANSETRON HCL 4 MG/2ML IJ SOLN: 4.0000 mg | Freq: Four times a day (QID) | INTRAMUSCULAR | Status: DC | PRN

## 2021-10-12 MED ORDER — INSULIN ASPART 100 UNIT/ML IJ SOLN: 0.0000 [IU] | Freq: Three times a day (TID) | INTRAMUSCULAR | Status: DC

## 2021-10-12 MED ORDER — ALBUTEROL SULFATE (2.5 MG/3ML) 0.083% IN NEBU: 2.5000 mg | INHALATION_SOLUTION | Freq: Four times a day (QID) | RESPIRATORY_TRACT | Status: DC | PRN

## 2021-10-12 MED ORDER — LORAZEPAM 2 MG/ML IJ SOLN
0.5000 mg | Freq: Four times a day (QID) | INTRAMUSCULAR | Status: DC | PRN
  Administered 2021-10-12: 0.5 mg via INTRAVENOUS
  Filled 2021-10-12: qty 1

## 2021-10-12 MED ORDER — INSULIN ASPART 100 UNIT/ML IJ SOLN
6.0000 [IU] | Freq: Once | INTRAMUSCULAR | Status: AC
  Administered 2021-10-12: 6 [IU] via SUBCUTANEOUS

## 2021-10-12 MED ORDER — INSULIN ASPART 100 UNIT/ML IJ SOLN
0.0000 [IU] | Freq: Three times a day (TID) | INTRAMUSCULAR | Status: DC
  Administered 2021-10-13: 1 [IU] via SUBCUTANEOUS
  Administered 2021-10-13: 5 [IU] via SUBCUTANEOUS
  Administered 2021-10-13: 2 [IU] via SUBCUTANEOUS
  Administered 2021-10-14: 3 [IU] via SUBCUTANEOUS
  Administered 2021-10-14: 2 [IU] via SUBCUTANEOUS

## 2021-10-12 MED ORDER — ONDANSETRON HCL 4 MG PO TABS: 4.0000 mg | ORAL_TABLET | Freq: Four times a day (QID) | ORAL | Status: DC | PRN

## 2021-10-12 MED ORDER — LISINOPRIL 10 MG PO TABS
10.0000 mg | ORAL_TABLET | Freq: Every day | ORAL | Status: DC
  Administered 2021-10-12 – 2021-10-16 (×4): 10 mg via ORAL
  Filled 2021-10-12 (×4): qty 1

## 2021-10-12 MED ORDER — SODIUM CHLORIDE 0.9 % IV SOLN: INTRAVENOUS | Status: DC

## 2021-10-12 MED ORDER — HYDROCODONE-ACETAMINOPHEN 5-325 MG PO TABS
1.0000 | ORAL_TABLET | Freq: Four times a day (QID) | ORAL | Status: DC | PRN
  Administered 2021-10-12 – 2021-10-14 (×3): 1 via ORAL
  Filled 2021-10-12 (×3): qty 1

## 2021-10-12 NOTE — Assessment & Plan Note (Addendum)
On admission patient was found to have blood pressures elevated up to 172/80.  She is not on any blood pressure medications due to stopping them several months ago.   -transitioned to comfort care
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
## 2021-10-12 NOTE — Assessment & Plan Note (Addendum)
Patient found to have nondisplaced type II dens fracture, nondisplaced fracture of the right and left posterior arch of C1, and communicated fracture of the right anterior arch CT. neurosurgery had been consulted recommending conservative management with continuation of hard cervical collar for at least 3 months, and outpatient follow-up with them in 1 month along with imaging. Unclear cause of the fall at this time, but thought to be mechanical. -transitioned to comfort care

## 2021-10-12 NOTE — Plan of Care (Signed)
  Problem: Safety: Goal: Non-violent Restraint(s) Outcome: Not Progressing   Problem: Education: Goal: Knowledge of General Education information will improve Description: Including pain rating scale, medication(s)/side effects and non-pharmacologic comfort measures Outcome: Not Progressing   Problem: Health Behavior/Discharge Planning: Goal: Ability to manage health-related needs will improve Outcome: Not Progressing   Problem: Clinical Measurements: Goal: Ability to maintain clinical measurements within normal limits will improve Outcome: Not Progressing Goal: Will remain free from infection Outcome: Not Progressing Goal: Diagnostic test results will improve Outcome: Not Progressing Goal: Respiratory complications will improve Outcome: Not Progressing Goal: Cardiovascular complication will be avoided Outcome: Not Progressing   Problem: Activity: Goal: Risk for activity intolerance will decrease Outcome: Not Progressing   Problem: Nutrition: Goal: Adequate nutrition will be maintained Outcome: Not Progressing   Problem: Coping: Goal: Level of anxiety will decrease Outcome: Not Progressing   Problem: Elimination: Goal: Will not experience complications related to bowel motility Outcome: Not Progressing Goal: Will not experience complications related to urinary retention Outcome: Not Progressing   Problem: Pain Managment: Goal: General experience of comfort will improve Outcome: Not Progressing   Problem: Safety: Goal: Ability to remain free from injury will improve Outcome: Not Progressing   Problem: Skin Integrity: Goal: Risk for impaired skin integrity will decrease Outcome: Not Progressing   
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
## 2021-10-12 NOTE — Consult Note (Addendum)
Reason for Consult: C1 and C2 fractures Referring Physician: Truddie Hidden, MD  HPI: Erin Beck is an 81 y.o. female with a PmHx significant for CAD, DM2, HLD, HTN, osteopenia, and dementia. She is from home where she lives with her family. She was found down on the floor by her family. The patient is amnestic to the fall and is unsure of the precipitating events. The fall was unwitnessed. EMS was notified and brought her to [facility] ED for evaluation. On arrival, she was noted to have a hematoma on her forehead. She denied pain but reported that she felt woozy. CT imaging of her head, maxillofacial, and cervical spine were obtained. Due to findings of fractures at C1 and C2, NSX consult was requested.   Past Medical History:  Diagnosis Date   Allergic rhinitis    Anxiety    Arthritis    OA   Asthma    CAD (coronary artery disease), native coronary artery 05/23/2016   Non obstructive with 40% mid LAD and 62% ramus   Complication of anesthesia    gas bubble rt eye from an old retinal detachment   Diabetes mellitus    type II   Diverticulitis    Hx of   Full dentures    GERD (gastroesophageal reflux disease)    Hematuria    HX OF MICROSCOPIC BLOOD IN URINE AND HX OF CYST ON ONE KIDNEY--BUT NO KIDNEY DISEASE   Hyperlipidemia    Hypertension    IBS (irritable bowel syndrome)    Osteopenia    Wears glasses     Past Surgical History:  Procedure Laterality Date   ABDOMINAL HYSTERECTOMY     APPENDECTOMY     per pt   CARDIAC CATHETERIZATION N/A 12/29/2015   Procedure: Left Heart Cath and Coronary Angiography;  Surgeon: Belva Crome, MD;  Location: [facility];  Service: Cardiovascular;  Laterality: N/A;   CARPAL TUNNEL RELEASE     both hands   CHOLECYSTECTOMY     COLONOSCOPY     EYE SURGERY     BILATERAL CATARACTS REMOVED   EYE SURGERY     gas bubble in rt eye from a retinal detachment   JOINT REPLACEMENT  2000   LEFT TOTAL KNEE ARTHROPLASTY   JOINT REPLACEMENT   2013   rt total knee   KNEE ARTHROSCOPY  1990   chondromalacia    TOTAL KNEE ARTHROPLASTY  12/05/2011   Procedure: TOTAL KNEE ARTHROPLASTY;  Surgeon: Magnus Sinning, MD;  Location: [facility];  Service: Orthopedics;  Laterality: Right;   ULNAR NERVE TRANSPOSITION Right 09/15/2014   Procedure: Right cubital tunnel release with transposition of ulnar nerve;  Surgeon: Marianna Payment, MD;  Location: [facility] [place];  Service: Orthopedics;  Laterality: Right;    Family History  Problem Relation Age of Onset   Pneumonia Mother    Diabetes Father    Cancer Father        of the gallbladder   Heart disease Maternal Grandmother    Heart disease Paternal Grandmother    Diabetes Brother     Social History:  reports that she quit smoking about 12 years ago. She has a 48.00 pack-year smoking history. She has never used smokeless tobacco. She reports that she does not drink alcohol and does not use drugs.  Allergies:  Allergies  Allergen Reactions   Naproxen Rash    Trouble breathing   Naproxen Sodium Rash    Trouble breathing  Aspirin     REACTION: burns stomach PT STATES SHE DOES TOLERATE THE 81 MG ASPIRIN DAILY   Atorvastatin     REACTION: muscle pain   Buspar [Buspirone Hcl]     Multiple side eff   Gabapentin Other (See Comments)    Palpitations/ dizziness    Metformin And Related     Mm aches, palpitations, SOB, worse eyesight, loss of balance, sweating, low CBGs   Metoprolol Other (See Comments)    dizziness   Norco [Hydrocodone-Acetaminophen] Other (See Comments)    Dizzy and problems remembering    Pravastatin     Leg pain   Simvastatin     REACTION: GI side eff and swelling   Sulfa Antibiotics Other (See Comments)    Severe joint pain   Sulfonamide Derivatives     REACTION: reaction not known   Zetia [Ezetimibe]     Caused dizziness    Medications: I have reviewed the patient's current medications.  Results for orders placed or performed during the  hospital encounter of 10/12/21 (from the past 48 hour(s))  CBG monitoring, ED     Status: Abnormal   Collection Time: 10/12/21  7:44 AM  Result Value Ref Range   Glucose-Capillary 459 (H) 70 - 99 mg/dL    Comment: Glucose reference range applies only to samples taken after fasting for at least 8 hours.  Comprehensive metabolic panel     Status: Abnormal   Collection Time: 10/12/21  7:45 AM  Result Value Ref Range   Sodium 129 (L) 135 - 145 mmol/L   Potassium 4.2 3.5 - 5.1 mmol/L   Chloride 94 (L) 98 - 111 mmol/L   CO2 24 22 - 32 mmol/L   Glucose, Bld 517 (HH) 70 - 99 mg/dL    Comment: Glucose reference range applies only to samples taken after fasting for at least 8 hours. CRITICAL RESULT CALLED TO, READ BACK BY AND VERIFIED WITH:  Marja Kays, RN AT [phone] ON 10/12/21 BY RIVETM    BUN 16 8 - 23 mg/dL   Creatinine, Ser 0.80 0.44 - 1.00 mg/dL   Calcium 9.2 8.9 - 10.3 mg/dL   Total Protein 6.7 6.5 - 8.1 g/dL   Albumin 3.9 3.5 - 5.0 g/dL   AST 22 15 - 41 U/L   ALT 17 0 - 44 U/L   Alkaline Phosphatase 67 38 - 126 U/L   Total Bilirubin 0.7 0.3 - 1.2 mg/dL   GFR, Estimated >60 >60 mL/min    Comment: (NOTE) Calculated using the CKD-EPI Creatinine Equation (2021)    Anion gap 11 5 - 15    Comment: Performed at [facility], [place] [address], [place], [place] [address]  CBC with Differential     Status: Abnormal   Collection Time: 10/12/21  7:45 AM  Result Value Ref Range   WBC 19.3 (H) 4.0 - 10.5 K/uL   RBC 4.85 3.87 - 5.11 MIL/uL   Hemoglobin 15.3 (H) 12.0 - 15.0 g/dL   HCT 44.8 36.0 - 46.0 %   MCV 92.4 80.0 - 100.0 fL   MCH 31.5 26.0 - 34.0 pg   MCHC 34.2 30.0 - 36.0 g/dL   RDW 12.1 11.5 - 15.5 %   Platelets 595 (H) 150 - 400 K/uL   nRBC 0.0 0.0 - 0.2 %   Neutrophils Relative % 90 %   Neutro Abs 17.4 (H) 1.7 - 7.7 K/uL   Lymphocytes Relative 4 %   Lymphs Abs 0.7 0.7 -  4.0 K/uL   Monocytes Relative 5 %   Monocytes Absolute 0.9 0.1 - 1.0 K/uL   Eosinophils Relative 0 %    Eosinophils Absolute 0.0 0.0 - 0.5 K/uL   Basophils Relative 0 %   Basophils Absolute 0.1 0.0 - 0.1 K/uL   Immature Granulocytes 1 %   Abs Immature Granulocytes 0.12 (H) 0.00 - 0.07 K/uL    Comment: Performed at [facility] [address] [place], [place] [address]  Troponin I (High Sensitivity)     Status: None   Collection Time: 10/12/21  7:45 AM  Result Value Ref Range   Troponin I (High Sensitivity) 5 <18 ng/L    Comment: (NOTE) Elevated high sensitivity troponin I (hsTnI) values and significant  changes across serial measurements may suggest ACS but many other  chronic and acute conditions are known to elevate hsTnI results.  Refer to the Links section for chest pain algorithms and additional  guidance. Performed at [facility], [place] [address] [place], [place] [address]   CK     Status: None   Collection Time: 10/12/21  7:45 AM  Result Value Ref Range   Total CK 60 38 - 234 U/L    Comment: Performed at [facility], [place] [address] [place], [place] [address]    CT Head Wo Contrast  Result Date: 10/12/2021 CLINICAL DATA:  Facial trauma, status post fall EXAM: CT HEAD WITHOUT CONTRAST CT MAXILLOFACIAL WITHOUT CONTRAST CT CERVICAL SPINE WITHOUT CONTRAST TECHNIQUE: Multidetector CT imaging of the head, cervical spine, and maxillofacial structures were performed using the standard protocol without intravenous contrast. Multiplanar CT image reconstructions of the cervical spine and maxillofacial structures were also generated. RADIATION DOSE REDUCTION: This exam was performed according to the departmental dose-optimization program which includes automated exposure control, adjustment of the mA and/or kV according to patient size and/or use of iterative reconstruction technique. COMPARISON:  06/10/2014 FINDINGS: CT HEAD FINDINGS Brain: No evidence of acute infarction, hemorrhage, extra-axial collection, ventriculomegaly, or mass effect. Generalized cerebral atrophy.  Periventricular white matter low attenuation likely secondary to microangiopathy. Vascular: Cerebrovascular atherosclerotic calcifications are noted. Skull: Negative for fracture or focal lesion. Sinuses/Orbits: Visualized portions of the orbits are unremarkable. Bilateral maxillary sinus mucosal thickening. Visualized portions of the mastoid air cells are unremarkable. Other: Small left frontal hematoma. CT MAXILLOFACIAL FINDINGS Osseous: Minimally displaced left nasal bone fracture. No other fracture or dislocation. Temporomandibular joints are normal. No aggressive osseous lesion. Orbits: Negative. No traumatic or inflammatory finding. Sinuses: Mild bilateral maxillary sinus mucosal thickening. Opacification of the right frontal sinus. Left frontal sinus is not pneumatized. Soft tissues: Soft tissue swelling over the left nasal bone. CT CERVICAL SPINE FINDINGS Alignment: Anatomic alignment.  No static listhesis. Skull base and vertebrae: Nondisplaced type 2 dens fracture. Nondisplaced fracture of the right and left posterior arch of C1. Comminuted fracture of the right anterior arch of C1. No other fracture or dislocation. No aggressive osseous lesion. Soft tissues and spinal canal: Intraspinal soft tissues are not fully imaged on this examination due to poor soft tissue contrast, but there is no gross soft tissue abnormality. No prevertebral fluid or swelling. No visible canal hematoma. Disc levels: Degenerative disease with disc height loss at C3-4, C4-5, C5-6, C6-7 and to lesser extent C7-T1. Ankylosis of the vertebral bodies and posterior elements at C4-5. Bilateral facet arthropathy throughout the cervical spine. Bilateral uncovertebral degenerative changes at C3-4 with bilateral foraminal stenosis. Bilateral uncovertebral degenerative changes at C4-5 with bilateral  foraminal stenosis. Bilateral uncovertebral degenerative changes at C5-6 with bilateral foraminal narrowing. Upper chest: Lung apices are clear.  Other: No fluid collection or hematoma. Carotid artery atherosclerosis. IMPRESSION: 1. No acute intracranial pathology. Small left frontal hematoma. 2. Minimally displaced left nasal bone fracture. Soft tissue swelling over the left nasal bone. 3. Nondisplaced type 2 dens fracture. Nondisplaced fracture of the right and left posterior arch of C1. Comminuted fracture of the right anterior arch of C1. 4. Cervical spondylosis. Critical Value/emergent results were called by telephone at the time of interpretation on 10/12/2021 at 8:47 am to provider [facility] , who verbally acknowledged these results. Electronically Signed   By: Kathreen Devoid M.D.   On: 10/12/2021 08:49   CT Cervical Spine Wo Contrast  Result Date: 10/12/2021 CLINICAL DATA:  Facial trauma, status post fall EXAM: CT HEAD WITHOUT CONTRAST CT MAXILLOFACIAL WITHOUT CONTRAST CT CERVICAL SPINE WITHOUT CONTRAST TECHNIQUE: Multidetector CT imaging of the head, cervical spine, and maxillofacial structures were performed using the standard protocol without intravenous contrast. Multiplanar CT image reconstructions of the cervical spine and maxillofacial structures were also generated. RADIATION DOSE REDUCTION: This exam was performed according to the departmental dose-optimization program which includes automated exposure control, adjustment of the mA and/or kV according to patient size and/or use of iterative reconstruction technique. COMPARISON:  06/10/2014 FINDINGS: CT HEAD FINDINGS Brain: No evidence of acute infarction, hemorrhage, extra-axial collection, ventriculomegaly, or mass effect. Generalized cerebral atrophy. Periventricular white matter low attenuation likely secondary to microangiopathy. Vascular: Cerebrovascular atherosclerotic calcifications are noted. Skull: Negative for fracture or focal lesion. Sinuses/Orbits: Visualized portions of the orbits are unremarkable. Bilateral maxillary sinus mucosal thickening. Visualized portions of the  mastoid air cells are unremarkable. Other: Small left frontal hematoma. CT MAXILLOFACIAL FINDINGS Osseous: Minimally displaced left nasal bone fracture. No other fracture or dislocation. Temporomandibular joints are normal. No aggressive osseous lesion. Orbits: Negative. No traumatic or inflammatory finding. Sinuses: Mild bilateral maxillary sinus mucosal thickening. Opacification of the right frontal sinus. Left frontal sinus is not pneumatized. Soft tissues: Soft tissue swelling over the left nasal bone. CT CERVICAL SPINE FINDINGS Alignment: Anatomic alignment.  No static listhesis. Skull base and vertebrae: Nondisplaced type 2 dens fracture. Nondisplaced fracture of the right and left posterior arch of C1. Comminuted fracture of the right anterior arch of C1. No other fracture or dislocation. No aggressive osseous lesion. Soft tissues and spinal canal: Intraspinal soft tissues are not fully imaged on this examination due to poor soft tissue contrast, but there is no gross soft tissue abnormality. No prevertebral fluid or swelling. No visible canal hematoma. Disc levels: Degenerative disease with disc height loss at C3-4, C4-5, C5-6, C6-7 and to lesser extent C7-T1. Ankylosis of the vertebral bodies and posterior elements at C4-5. Bilateral facet arthropathy throughout the cervical spine. Bilateral uncovertebral degenerative changes at C3-4 with bilateral foraminal stenosis. Bilateral uncovertebral degenerative changes at C4-5 with bilateral foraminal stenosis. Bilateral uncovertebral degenerative changes at C5-6 with bilateral foraminal narrowing. Upper chest: Lung apices are clear. Other: No fluid collection or hematoma. Carotid artery atherosclerosis. IMPRESSION: 1. No acute intracranial pathology. Small left frontal hematoma. 2. Minimally displaced left nasal bone fracture. Soft tissue swelling over the left nasal bone. 3. Nondisplaced type 2 dens fracture. Nondisplaced fracture of the right and left posterior  arch of C1. Comminuted fracture of the right anterior arch of C1. 4. Cervical spondylosis. Critical Value/emergent results were called by telephone at the time of interpretation on 10/12/2021 at 8:47 am to provider [facility] ,  who verbally acknowledged these results. Electronically Signed   By: Kathreen Devoid M.D.   On: 10/12/2021 08:49   CT Maxillofacial WO CM  Result Date: 10/12/2021 CLINICAL DATA:  Facial trauma, status post fall EXAM: CT HEAD WITHOUT CONTRAST CT MAXILLOFACIAL WITHOUT CONTRAST CT CERVICAL SPINE WITHOUT CONTRAST TECHNIQUE: Multidetector CT imaging of the head, cervical spine, and maxillofacial structures were performed using the standard protocol without intravenous contrast. Multiplanar CT image reconstructions of the cervical spine and maxillofacial structures were also generated. RADIATION DOSE REDUCTION: This exam was performed according to the departmental dose-optimization program which includes automated exposure control, adjustment of the mA and/or kV according to patient size and/or use of iterative reconstruction technique. COMPARISON:  06/10/2014 FINDINGS: CT HEAD FINDINGS Brain: No evidence of acute infarction, hemorrhage, extra-axial collection, ventriculomegaly, or mass effect. Generalized cerebral atrophy. Periventricular white matter low attenuation likely secondary to microangiopathy. Vascular: Cerebrovascular atherosclerotic calcifications are noted. Skull: Negative for fracture or focal lesion. Sinuses/Orbits: Visualized portions of the orbits are unremarkable. Bilateral maxillary sinus mucosal thickening. Visualized portions of the mastoid air cells are unremarkable. Other: Small left frontal hematoma. CT MAXILLOFACIAL FINDINGS Osseous: Minimally displaced left nasal bone fracture. No other fracture or dislocation. Temporomandibular joints are normal. No aggressive osseous lesion. Orbits: Negative. No traumatic or inflammatory finding. Sinuses: Mild bilateral maxillary  sinus mucosal thickening. Opacification of the right frontal sinus. Left frontal sinus is not pneumatized. Soft tissues: Soft tissue swelling over the left nasal bone. CT CERVICAL SPINE FINDINGS Alignment: Anatomic alignment.  No static listhesis. Skull base and vertebrae: Nondisplaced type 2 dens fracture. Nondisplaced fracture of the right and left posterior arch of C1. Comminuted fracture of the right anterior arch of C1. No other fracture or dislocation. No aggressive osseous lesion. Soft tissues and spinal canal: Intraspinal soft tissues are not fully imaged on this examination due to poor soft tissue contrast, but there is no gross soft tissue abnormality. No prevertebral fluid or swelling. No visible canal hematoma. Disc levels: Degenerative disease with disc height loss at C3-4, C4-5, C5-6, C6-7 and to lesser extent C7-T1. Ankylosis of the vertebral bodies and posterior elements at C4-5. Bilateral facet arthropathy throughout the cervical spine. Bilateral uncovertebral degenerative changes at C3-4 with bilateral foraminal stenosis. Bilateral uncovertebral degenerative changes at C4-5 with bilateral foraminal stenosis. Bilateral uncovertebral degenerative changes at C5-6 with bilateral foraminal narrowing. Upper chest: Lung apices are clear. Other: No fluid collection or hematoma. Carotid artery atherosclerosis. IMPRESSION: 1. No acute intracranial pathology. Small left frontal hematoma. 2. Minimally displaced left nasal bone fracture. Soft tissue swelling over the left nasal bone. 3. Nondisplaced type 2 dens fracture. Nondisplaced fracture of the right and left posterior arch of C1. Comminuted fracture of the right anterior arch of C1. 4. Cervical spondylosis. Critical Value/emergent results were called by telephone at the time of interpretation on 10/12/2021 at 8:47 am to provider [facility] , who verbally acknowledged these results. Electronically Signed   By: Kathreen Devoid M.D.   On: 10/12/2021 08:49     ROS: Per HPI Blood pressure (!) 172/80, pulse 91, temperature 98.2 F (36.8 C), temperature source Oral, resp. rate 16, height 5' (1.524 m), weight 66.4 kg, SpO2 95 %. Physical Exam: Patient is awake, alert, and conversant. She is disoriented whish is her baseline.  She is in NAD and VSS. Speech is fluent and appropriate. MAEW with good strength that is symmetric bilaterally. 5/5 BUE/BLE. Sensation to light touch is intact. PERLA, EOMI. CNs grossly intact. Hematoma left forehead and  L periorbital region. Hard cervical collar in place.    Assessment/Plan: 81 y.o. female who was BIB EMS to [facility] ED after being found down on the floor by her family. The patient is amnestic to the fall and is not aware of any events surrounding the fall. On arrival to the ED, she was noted to have a CBG of 517 and a WBC count of 19.3. CT imaging was reviewed. CT head revealed no acute intracranial abnormalities. CT cervical spine revealed a nondisplaced type II odontoid fracture with nondisplaced fractures of the right and left posterior arch of C1 and comminuted fracture of the right anterior arch of C1. Her neurological examination is at her baseline without any noted acute neurological deficits. She has a history of dementia and baseline confusion. No acute neurosurgical intervention is indicated. I would recommend medical management. Pain control. Hard cervical collar at all times for 3 months. She can follow up as an outpatient in 1 month with 2V cervical radiographs.    Marvis Moeller, DNP, NP-C 10/12/2021, 9:47 AM   Addendum:  Patient s/e, agree with above   C1, C2 Fx -rec collar and followup as outpatient   Thank you for allowing me to participate in this patient's care.  Please do not hesitate to call with questions or concerns.   Elwin Sleight, [facility] Cell: [phone]

## 2021-10-12 NOTE — Assessment & Plan Note (Addendum)
Patient had previously been on Paxil 10 mg daily, but had been refusing to take the medication. -Delirium precautions -Bed alarm on -palliative care consult for GOC: -transitioned to comfort care

## 2021-10-12 NOTE — ED Notes (Signed)
Pt has repeatable pulled her teley monitor off, BP cuff, pulse ox cord. I have attempted on multiple occasions to reorient the pt and the pt will not leave any cords on.

## 2021-10-12 NOTE — Progress Notes (Signed)
Pt arrived to unit.  Pt is alert and disoriented x 4.  Cervical collar in place.  Telemetry placed. No IV site upon arrival to unit. NAD noted.  Pt is restless and orders secured for 1:1 sitter.

## 2021-10-12 NOTE — ED Notes (Signed)
When coming out of another pt room  pt was found by me fully dressed in her home clothing with all cords off and had pulled her IV out including had taken off her c-collar.  I was able to place the c-collar back on and get her gowned and back in the bed.  Bed alarm is now in place MD aware

## 2021-10-12 NOTE — TOC CAGE-AID Note (Signed)
Transition of Care [facility]) - CAGE-AID Screening   Patient Details  Name: Erin Beck MRN: [id number] Date of Birth: Jan 10, 1941  Transition of Care [facility]) CM/SW Contact:    Riyan Haile C Tarpley-Carter, [place] Phone Number: 10/12/2021, 12:01 PM   Clinical Narrative: Pt is unable to participate in Cage Aid. Pt has been diagnosed with dementia.  Ourania Hamler Tarpley-Carter, MSW, LCSW-A Pronouns:  She/Her/Hers [place] Transitions of Care Clinical Social Worker Direct Number:  [phone] [email or web address]  CAGE-AID Screening: Substance Abuse Screening unable to be completed due to: : Patient unable to participate             Substance Abuse Education Offered: No

## 2021-10-12 NOTE — Assessment & Plan Note (Addendum)
On admission patient was found to have blood sugars elevated up to 516.  She previously had been on glipizide and, but had not taken this in several months.   -transitioned to comfort care

## 2021-10-12 NOTE — Plan of Care (Signed)

## 2021-10-12 NOTE — ED Notes (Signed)
[place] J placed with [employer]

## 2021-10-12 NOTE — Assessment & Plan Note (Addendum)
Initial labs significant for WBC 19.3.  Patient was otherwise noted to be afebrile.  Chest -resolved -transitioned to comfort care

## 2021-10-12 NOTE — Progress Notes (Signed)
[facility] informed that patient has no IV site and is in need to have Ativan changed to PO new order received dwill continue to monitor. Arthor Captain LPN

## 2021-10-12 NOTE — Assessment & Plan Note (Signed)
Patient was noted to have new Q waves in the inferior leads previously not present on prior tracings.

## 2021-10-12 NOTE — Progress Notes (Signed)
Orthopedic Tech Progress Note Patient Details:  Erin Beck 1940-09-22 [id number] Level 2 Trauma. Downgraded to non trauma Patient ID: Erin Beck, female   DOB: 12/16/1940, 81 y.o.   MRN: [id number]  Erin Beck 10/12/2021, 7:53 AM

## 2021-10-12 NOTE — Assessment & Plan Note (Addendum)
Acute.  On admission sodium was noted to be 129.  However, after correcting for hyperglycemia sodium noted to be within normal limits at 139 and therefore pseudohyponatremia. -resolved

## 2021-10-12 NOTE — ED Provider Notes (Signed)
[facility] EMERGENCY DEPARTMENT  Provider Note  CSN: [id number] Arrival date & time: 10/12/21 0732  History Chief Complaint  Patient presents with   Lytle Michaels    Erin Beck is a 81 y.o. female brought from home via EMS who supplement history. Patient was found on the floor of her home. Unsure how she ended up on the floor. She does not recall falling. She was noted to have a hematoma on her forehead. There was a question initially of whether she was on a blood thinner and so she was activated as a Trauma. On arrival she was noted not to be on a blood thinner so Trauma was downgraded. She denies any pain at the time of my evaluation but notes she feels 'woozy'.    Home Medications Prior to Admission medications   Medication Sig Start Date End Date Taking? Authorizing Provider  acetaminophen (TYLENOL) 325 MG tablet Take 650 mg by mouth every 6 (six) hours as needed (pain).   Yes [provider]  ACCU-CHEK FASTCLIX LANCETS MISC TEST FOUR TIMES DAILY AS DIRECTED 10/11/17   Philemon Kingdom, MD  Blood Glucose Monitoring Suppl (ONETOUCH VERIO) w/Device KIT 1 kit by Does not apply route as directed. 10/24/17   Philemon Kingdom, MD  donepezil (ARICEPT) 10 MG tablet Take 1 tablet (10 mg total) by mouth at bedtime. Patient not taking: Reported on 10/12/2021 04/17/21   Tower, [name], MD  glipiZIDE (GLUCOTROL) 5 MG tablet Take 0.5 tablets (2.5 mg total) by mouth 2 (two) times daily before a meal. Patient not taking: Reported on 10/12/2021 05/22/21   Tower, Roque Lias A, MD  glucose blood (ACCU-CHEK GUIDE) test strip Use as instructed to test once daily 07/01/18   Philemon Kingdom, MD  hydrochlorothiazide (HYDRODIURIL) 25 MG tablet TAKE 1 TABLET EVERY DAY Patient not taking: Reported on 10/12/2021 11/29/20   Tower, [name], MD  Lancets [facility] ULTRASOFT) lancets Use as instructed 10/24/17   Philemon Kingdom, MD  lisinopril (ZESTRIL) 10 MG tablet TAKE 1 TABLET EVERY DAY Patient  not taking: Reported on 10/12/2021 01/05/21   Tower, [name], MD  PARoxetine (PAXIL) 10 MG tablet Take 1 tablet (10 mg total) by mouth daily. Patient not taking: Reported on 10/12/2021 04/17/21   Abner Greenspan, MD  rosuvastatin (CRESTOR) 10 MG tablet TAKE 1 TABLET ON MONDAY AND THURSDAY Patient not taking: Reported on 10/12/2021 10/25/20   Abner Greenspan, MD     Allergies    Naproxen, Naproxen sodium, Aspirin, Atorvastatin, Buspar [buspirone hcl], Gabapentin, Metformin and related, Metoprolol, Norco [hydrocodone-acetaminophen], Pravastatin, Simvastatin, Sulfa antibiotics, Sulfonamide derivatives, and Zetia [ezetimibe]   Review of Systems   Review of Systems Please see HPI for pertinent positives and negatives  Physical Exam BP (!) 146/76    Pulse 87    Temp 98.2 F (36.8 C) (Oral)    Resp 19    Ht 5' (1.524 m)    Wt 66.4 kg    SpO2 95%    BMI 28.59 kg/m   Physical Exam Vitals and nursing note reviewed.  Constitutional:      Appearance: Normal appearance.  HENT:     Head: Normocephalic.     Comments: Hematoma left forehead and L periorbital area    Nose: Nose normal.     Mouth/Throat:     Mouth: Mucous membranes are moist.  Eyes:     Extraocular Movements: Extraocular movements intact.     Conjunctiva/sclera: Conjunctivae normal.     Pupils: Pupils  are equal, round, and reactive to light.  Neck:     Comments: In C-collar Cardiovascular:     Rate and Rhythm: Normal rate.  Pulmonary:     Effort: Pulmonary effort is normal.     Breath sounds: Normal breath sounds.  Chest:     Chest wall: No tenderness.  Abdominal:     General: Abdomen is flat.     Palpations: Abdomen is soft.     Tenderness: There is no abdominal tenderness.  Musculoskeletal:        General: No swelling, tenderness or deformity. Normal range of motion.  Skin:    General: Skin is warm and dry.  Neurological:     General: No focal deficit present.     Mental Status: She is alert. Mental status is at  baseline. She is disoriented.     Cranial Nerves: No cranial nerve deficit.     Sensory: No sensory deficit.     Motor: No weakness.  Psychiatric:        Mood and Affect: Mood normal.    ED Results / Procedures / Treatments   EKG EKG Interpretation  Date/Time:  Thursday October 12 2021 07:50:05 EST Ventricular Rate:  93 PR Interval:  161 QRS Duration: 90 QT Interval:  355 QTC Calculation: 442 R Axis:   -1 Text Interpretation: Sinus rhythm Abnormal inferior Q waves Since last tracing inferior Q wave is now present Confirmed by Calvert Cantor [phone]) on 10/12/2021 8:09:16 AM  Procedures Procedures  Medications Ordered in the ED Medications  lactated ringers bolus 1,000 mL (has no administration in time range)  insulin aspart (novoLOG) injection 6 Units (has no administration in time range)    Initial Impression and Plan  Patient with fall of unclear etiology and unknown downtime. Will check labs, CT imaging of her head, neck and face. NSR on monitor at this time.   ED Course   Clinical Course as of 10/12/21 1118  Thu Oct 12, 2021  0807 CBC with leukocytosis. CBG is also elevated.  [CS]  0901 CMP with elevated glucose, patient is apparently not taking her medications as prescribed. No signs of acidosis. Will recheck after IVF.  [CS]  [id number] CT images viewed and discussed with Radiologist. Head is neg, C-spine with C1 and C2 fractures. Will discuss with Neurosurgery.  [CS]  3300 Trop and CK are normal.  [CS]  [phone] Spoke with Neurosurgery, who will consult.  [CS]  17 Son is at bedside now, he reports his brother told him she had actually fallen down the stairs earlier. I do not see any other injuries beyond what's already been imaged. Anticipate she will need admission for the hyperglycemia and neck fracture. Per son, she has been refusing to take her medications or go to the doctor recently.  [CS]  6333 UA without signs of infection. Will give a dose of insulin for  hyperglycemia. Hospitalists paged for admission.  [CS]  1116 Spoke with Dr. Harvest Forest, Hospitalist who will evaluate for admission. [CS]    Clinical Course User Index [CS] Truddie Hidden, MD     MDM Rules/Calculators/A&P Medical Decision Making Problems Addressed: Closed fracture of cervical vertebra, unspecified cervical vertebral level, initial encounter [facility]): acute illness or injury that poses a threat to life or bodily functions Fall in home, initial encounter: acute illness or injury that poses a threat to life or bodily functions Hyperglycemia: acute illness or injury that poses a threat to life or bodily functions  Amount and/or  Complexity of Data Reviewed Labs: ordered. Decision-making details documented in ED Course. Radiology: ordered and independent interpretation performed. Decision-making details documented in ED Course. ECG/medicine tests: ordered and independent interpretation performed. Decision-making details documented in ED Course.  Risk Prescription drug management. Decision regarding hospitalization.    Final Clinical Impression(s) / ED Diagnoses Final diagnoses:  Fall in home, initial encounter  Closed fracture of cervical vertebra, unspecified cervical vertebral level, initial encounter ([place])  Hyperglycemia    Rx / DC Orders ED Discharge Orders     None        Truddie Hidden, MD 10/12/21 1118

## 2021-10-12 NOTE — Assessment & Plan Note (Addendum)
Acute.  Thought to be likely reactive in nature. -transitioned to comfort care

## 2021-10-13 DIAGNOSIS — Z96653 Presence of artificial knee joint, bilateral: Secondary | ICD-10-CM | POA: Diagnosis present

## 2021-10-13 DIAGNOSIS — Z66 Do not resuscitate: Secondary | ICD-10-CM | POA: Diagnosis not present

## 2021-10-13 DIAGNOSIS — Z23 Encounter for immunization: Secondary | ICD-10-CM | POA: Diagnosis present

## 2021-10-13 DIAGNOSIS — S12030A Displaced posterior arch fracture of first cervical vertebra, initial encounter for closed fracture: Secondary | ICD-10-CM | POA: Diagnosis present

## 2021-10-13 DIAGNOSIS — E785 Hyperlipidemia, unspecified: Secondary | ICD-10-CM | POA: Diagnosis present

## 2021-10-13 DIAGNOSIS — Y92009 Unspecified place in unspecified non-institutional (private) residence as the place of occurrence of the external cause: Secondary | ICD-10-CM | POA: Diagnosis not present

## 2021-10-13 DIAGNOSIS — Z79899 Other long term (current) drug therapy: Secondary | ICD-10-CM | POA: Diagnosis not present

## 2021-10-13 DIAGNOSIS — E871 Hypo-osmolality and hyponatremia: Secondary | ICD-10-CM | POA: Diagnosis present

## 2021-10-13 DIAGNOSIS — W109XXA Fall (on) (from) unspecified stairs and steps, initial encounter: Secondary | ICD-10-CM | POA: Diagnosis present

## 2021-10-13 DIAGNOSIS — I10 Essential (primary) hypertension: Secondary | ICD-10-CM | POA: Diagnosis present

## 2021-10-13 DIAGNOSIS — S12110A Anterior displaced Type II dens fracture, initial encounter for closed fracture: Secondary | ICD-10-CM | POA: Diagnosis present

## 2021-10-13 DIAGNOSIS — Z7189 Other specified counseling: Secondary | ICD-10-CM

## 2021-10-13 DIAGNOSIS — Z87891 Personal history of nicotine dependence: Secondary | ICD-10-CM | POA: Diagnosis not present

## 2021-10-13 DIAGNOSIS — K219 Gastro-esophageal reflux disease without esophagitis: Secondary | ICD-10-CM | POA: Diagnosis present

## 2021-10-13 DIAGNOSIS — E1165 Type 2 diabetes mellitus with hyperglycemia: Secondary | ICD-10-CM | POA: Diagnosis present

## 2021-10-13 DIAGNOSIS — Z9841 Cataract extraction status, right eye: Secondary | ICD-10-CM | POA: Diagnosis not present

## 2021-10-13 DIAGNOSIS — D75839 Thrombocytosis, unspecified: Secondary | ICD-10-CM | POA: Diagnosis present

## 2021-10-13 DIAGNOSIS — Z9842 Cataract extraction status, left eye: Secondary | ICD-10-CM | POA: Diagnosis not present

## 2021-10-13 DIAGNOSIS — I2510 Atherosclerotic heart disease of native coronary artery without angina pectoris: Secondary | ICD-10-CM | POA: Diagnosis present

## 2021-10-13 DIAGNOSIS — R739 Hyperglycemia, unspecified: Secondary | ICD-10-CM | POA: Diagnosis present

## 2021-10-13 DIAGNOSIS — Z515 Encounter for palliative care: Secondary | ICD-10-CM

## 2021-10-13 DIAGNOSIS — Z8249 Family history of ischemic heart disease and other diseases of the circulatory system: Secondary | ICD-10-CM | POA: Diagnosis not present

## 2021-10-13 DIAGNOSIS — Z882 Allergy status to sulfonamides status: Secondary | ICD-10-CM | POA: Diagnosis not present

## 2021-10-13 DIAGNOSIS — Z888 Allergy status to other drugs, medicaments and biological substances status: Secondary | ICD-10-CM | POA: Diagnosis not present

## 2021-10-13 DIAGNOSIS — Z20822 Contact with and (suspected) exposure to covid-19: Secondary | ICD-10-CM | POA: Diagnosis present

## 2021-10-13 DIAGNOSIS — S0083XA Contusion of other part of head, initial encounter: Secondary | ICD-10-CM | POA: Diagnosis present

## 2021-10-13 DIAGNOSIS — Z833 Family history of diabetes mellitus: Secondary | ICD-10-CM | POA: Diagnosis not present

## 2021-10-13 DIAGNOSIS — F0394 Unspecified dementia, unspecified severity, with anxiety: Secondary | ICD-10-CM | POA: Diagnosis present

## 2021-10-13 LAB — HEMOGLOBIN A1C
Hgb A1c MFr Bld: 14.3 % — ABNORMAL HIGH (ref 4.8–5.6)
Mean Plasma Glucose: 364 mg/dL

## 2021-10-13 LAB — CBC
HCT: 41.8 % (ref 36.0–46.0)
Hemoglobin: 14 g/dL (ref 12.0–15.0)
MCH: 31.4 pg (ref 26.0–34.0)
MCHC: 33.5 g/dL (ref 30.0–36.0)
MCV: 93.7 fL (ref 80.0–100.0)
Platelets: 556 10*3/uL — ABNORMAL HIGH (ref 150–400)
RBC: 4.46 MIL/uL (ref 3.87–5.11)
RDW: 12.6 % (ref 11.5–15.5)
WBC: 9.7 10*3/uL (ref 4.0–10.5)
nRBC: 0 % (ref 0.0–0.2)

## 2021-10-13 LAB — BASIC METABOLIC PANEL
Anion gap: 8 (ref 5–15)
BUN: 12 mg/dL (ref 8–23)
CO2: 25 mmol/L (ref 22–32)
Calcium: 8.5 mg/dL — ABNORMAL LOW (ref 8.9–10.3)
Chloride: 102 mmol/L (ref 98–111)
Creatinine, Ser: 0.63 mg/dL (ref 0.44–1.00)
GFR, Estimated: >60 mL/min (ref >60)
Glucose, Bld: 275 mg/dL — ABNORMAL HIGH (ref 70–99)
Potassium: 3.5 mmol/L (ref 3.5–5.1)
Sodium: 135 mmol/L (ref 135–145)

## 2021-10-13 LAB — GLUCOSE, CAPILLARY
Glucose-Capillary: 262 mg/dL — ABNORMAL HIGH (ref 70–99)
Glucose-Capillary: 193 mg/dL — ABNORMAL HIGH (ref 70–99)
Glucose-Capillary: 145 mg/dL — ABNORMAL HIGH (ref 70–99)
Glucose-Capillary: 111 mg/dL — ABNORMAL HIGH (ref 70–99)

## 2021-10-13 MED ORDER — INSULIN GLARGINE-YFGN 100 UNIT/ML ~~LOC~~ SOLN
6.0000 [IU] | Freq: Every day | SUBCUTANEOUS | Status: DC
  Administered 2021-10-13 – 2021-10-14 (×2): 6 [IU] via SUBCUTANEOUS
  Filled 2021-10-13 (×2): qty 0.06

## 2021-10-13 NOTE — Consult Note (Signed)
Palliative Medicine Inpatient Consult Note  Consulting Provider: Geradine Girt, DO  Reason for consult:   [place] Palliative Medicine Consult  Reason for Consult? goc   HPI:  Per intake H&P --> Erin Beck is a 81 y.o. female with medical history significant of hypertension, hyperlipidemia, CAD, diabetes mellitus type 2, GERD, and dementia who presents after being found down.  History is obtained from the patient's son who lives at [place] as patient has significantly dementia.  The patient lives at home with her 2 older sons.  The patient has a lift chair in her home, but last night the power went out.  Her son suspects that she tried to go downstairs without the lift chair and fell.  She was found this morning on the floor by one of her sons after coming home from work.  Found to have multiple fractures of her cervical spine.  Palliative care has been asked to get involved to discuss goals of care in the setting of advancing dementia and a cervical spine fracture.  Clinical Assessment/Goals of Care:  *Please note that this is a verbal dictation therefore any spelling or grammatical errors are due to the "[place]" system interpretation.  I have reviewed medical records including EPIC notes, labs and imaging, received report from bedside RN, assessed the patient.    I called patient's son, Erin Beck to further discuss diagnosis prognosis, GOC, EOL wishes, disposition and options.   I introduced Palliative Medicine as specialized medical care for people living with serious illness. It focuses on providing relief from the symptoms and stress of a serious illness. The goal is to improve quality of life for both the patient and the family.  Medical History Review and Understanding:  I reviewed with Erin Beck, Erin Beck history of coronary artery disease, type 2 diabetes mellitus, hypertension, gastric reflux, and dementia.  Discussed that  over the last year Erin Beck has had a difficult time in the loss of her spouse.  She has been incrementally refusing to eat, drink, or participate in regular activities.  Erin Beck that since hospital admission he understands from the medical staff that Erin Beck continues to refuse to eat and does not want to keep the neck brace on.  We reviewed that she has been delirious throughout her hospital course and I shared the different extremes that delirium can present as.  Also reviewed that Pallie was at high risk due to her already known history of dementia.  Social History:  Erin Beck is from [place], [place] originally.  She was married to her husband for greater than 60 years though he passed away almost a year ago.  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  She has 3 children and 2 grandchildren were identified as her greatest joy Erin Beck and Erin Beck.  She is a woman of faith and practices within the [employer] denomination.  Functional and Nutritional State:  Functionally Erin Beck has required more help over the last year though she has been able to dress and bathe herself.  Her sons help prepare her meals though Erin Beck is not good about sitting and eating a robust meal she rather snacks on things.  Advance Directives: A detailed discussion was had today regarding advanced directives.  Erin Beck does have advanced directives and her son, Erin Beck is identified as her healthcare power of attorney.  A copy of  these will be obtained and scanned into Vynca  Code Status: Encouraged Erin Beck to consider DNR/DNI status understanding evidenced based poor outcomes in similar hospitalized patient, as the cause of arrest is likely associated with advanced chronic/terminal illness rather than an easily reversible acute cardio-pulmonary event. I explained that DNR/DNI does not change the medical plan and it  only comes into effect after a person has arrested (died).  It is a protective measure to keep [place] from harming the patient in their last moments of life. Erin Beck was agreeable to DNR/DNI with understanding that patient would not receive CPR, defibrillation, ACLS medications, or intubation.  Erin Beck Beck that patient does have a desire for natural death.  Discussion:  We reviewed the importance of having additional conversations with Erin Beck and his other 2 brothers present.  Doing is very clear that Erin Beck has lost her will and desire to live since the death of her husband and he Beck that he does not want to see her suffer any longer.  He was very tearful over the phone though does consent to meeting tomorrow at 2 PM for additional conversations.  Discussed the importance of continued conversation with family and their  medical providers regarding overall plan of care and treatment options, ensuring decisions are within the context of the patients values and GOCs.  Decision Maker: Erin Beck (son) [phone]  SUMMARY OF RECOMMENDATIONS   DNAR/DNI  Plan for family meeting tomorrow at [facility]  Ongoing Palliative support  Code Status/Advance Care Planning: DNAR/DNI   Symptom Management:  Delirium precautions:  -Lights and TV off, minimize interruptions at night -Blinds open and lights on during day -Glasses/hearing aid with patient -Get up during the day -Encourage a familiar face to remain present throughout the day -Frequent reorientation  Palliative Prophylaxis:  Aspiration, Bowel Regimen, Delirium Protocol, Frequent Pain Assessment, Oral Care, Palliative Wound Care, and Turn Reposition  Additional Recommendations (Limitations, Scope, Preferences): Treat what is treatable for now  Psycho-social/Spiritual:  Desire for further Chaplaincy support: Not presently Additional Recommendations: Education on acute cervical fractures   Prognosis: Worrisome in the setting of already  co-morbidities.   Discharge Planning: Discharge plan unclear at this time.   Vitals:   10/13/21 0730 10/13/21 1327  BP: (!) 96/47 121/75  Pulse: 66 78  Resp: 16 20  Temp: 98.3 F (36.8 C)   SpO2: 91% 94%    Intake/Output Summary (Last 24 hours) at 10/13/2021 1541 Last data filed at 10/13/2021 1344 Gross per 24 hour  Intake 320 ml  Output --  Net 320 ml   Last Weight  Most recent update: 10/13/2021  7:32 AM    Weight  59.1 kg (130 lb 4.7 oz)            Gen:  Elderly F in NAD HEENT: Dry  mucous membranes CV: Regular rate and rhythm  PULM:  On RA ABD: soft/nontender  EXT: No edema  Neuro: Disoriented  PPS: 20%   This conversation/these recommendations were discussed with patient primary care team, Dr. Eliseo Beck  MDM High ____________________________________________________ [place] Team Team Cell Phone: [phone] Please utilize secure chat with additional questions, if there is no response within 30 minutes please call the above phone number  Palliative Medicine Team providers are available by phone from 7am to 7pm daily and can be reached through the team cell phone.  Should this patient require assistance outside of these hours, please call the patient's attending physician.

## 2021-10-13 NOTE — Evaluation (Signed)
Occupational Therapy Evaluation Patient Details Name: Erin Beck MRN: [id number] DOB: 1941-04-20 Today's Date: 10/13/2021   History of Present Illness Pt is an 81 y.o. female admitted 10/12/21 after being found down at home by son with unwitnessed fall. Head/cervical CT revealed small L frontal hematoma, nondisplaced type II dens fx, nondisplaced fx R & L C1 posterior arch, communicated fx R anterior arch. Neurosx recommends non-op management with cervical collar. PMH includes dementia, HTN, HLD, CAD, DM2.   Clinical Impression   Pt unable to offer information about PLOF, minimally verbal, eyes closed the entire visit. Pt requires +2 total assist for bed mobility and +2 moderate assistance for standing at EOB with B hands held. She is dependent in all ADLs and demonstrates poor to fair sitting balance at EOB. Recommending SNF for further rehab upon discharge. Will follow acutely.      Recommendations for follow up therapy are one component of a multi-disciplinary discharge planning process, led by the attending physician.  Recommendations may be updated based on patient status, additional functional criteria and insurance authorization.   Follow Up Recommendations  Skilled nursing-short term rehab (<3 hours/day)    Assistance Recommended at Discharge Frequent or constant Supervision/Assistance  Patient can return home with the following Two people to help with walking and/or transfers;Two people to help with bathing/dressing/bathroom;Assistance with cooking/housework;Assistance with feeding;Direct supervision/assist for medications management;Direct supervision/assist for financial management;Assist for transportation;Help with stairs or ramp for entrance    Functional Status Assessment  Patient has had a recent decline in their functional status and/or demonstrates limited ability to make significant improvements in function in a reasonable and predictable amount of time  Equipment  Recommendations  Other (comment) (defer to next venue)    Recommendations for Other Services       Precautions / Restrictions Precautions Precautions: Cervical;Fall Precaution Booklet Issued: No Required Braces or Orthoses: Cervical Brace Cervical Brace: Hard collar;At all times Restrictions Weight Bearing Restrictions: No      Mobility Bed Mobility Overal bed mobility: Needs Assistance Bed Mobility: Supine to Sit, Sit to Supine     Supine to sit: +2 for physical assistance, Total assist Sit to supine: +2 for physical assistance, Total assist        Transfers Overall transfer level: Needs assistance Equipment used: 2 person hand held assist Transfers: Sit to/from Stand Sit to Stand: +2 physical assistance, Mod assist           General transfer comment: assist to rise and steady with B knees blocked, stood x 2, pt unable to take side steps toward [facility]      Balance Overall balance assessment: Needs assistance   Sitting balance-Leahy Scale: Poor Sitting balance - Comments: mod initially, progressed to min guard assist for short periods   Standing balance support: Bilateral upper extremity supported Standing balance-Leahy Scale: Poor Standing balance comment: at least mod assist in static standing for changing brief and pericare                           ADL either performed or assessed with clinical judgement   ADL                                         General ADL Comments: total assist     Vision   Additional Comments: eyes closed throughout     Perception  Praxis      Pertinent Vitals/Pain Pain Assessment Pain Assessment: Faces Faces Pain Scale: Hurts even more Pain Location: head/neck Pain Descriptors / Indicators: Grimacing, Guarding, Moaning Pain Intervention(s): Monitored during session, Repositioned     Hand Dominance     Extremity/Trunk Assessment Upper Extremity Assessment Upper Extremity  Assessment: Difficult to assess due to impaired cognition (tends grasp clothing, bedding when in contact with her hands, no observed functional use)   Lower Extremity Assessment Lower Extremity Assessment: Defer to PT evaluation   Cervical / Trunk Assessment Cervical / Trunk Assessment: Kyphotic   Communication Communication Communication: Expressive difficulties (pt only speaking in short phrases)   Cognition Arousal/Alertness: Lethargic Behavior During Therapy: Flat affect Overall Cognitive Status: Impaired/Different from baseline Area of Impairment: Following commands, Safety/judgement, Awareness                       Following Commands: Follows one step commands inconsistently, Follows one step commands with increased time (and multimodal cues) Safety/Judgement: Decreased awareness of safety, Decreased awareness of deficits     General Comments: Pt with eyes closed throughout session. Stated,"I am going through it," with urinary incontinence in standing, "Let go," when standing with hands held, "My head hurts," when returned to supine.     General Comments       Exercises     Shoulder Instructions      Home Living Family/patient expects to be discharged to:: Private residence Living Arrangements: Children                               Additional Comments: Pt unable to offer home set up, level of care at home or PLOF, no family available.      Prior Functioning/Environment                          OT Problem List: Decreased strength;Impaired balance (sitting and/or standing);Decreased knowledge of precautions;Decreased knowledge of use of DME or AE;Pain;Decreased cognition;Decreased safety awareness      OT Treatment/Interventions: Self-care/ADL training;DME and/or AE instruction;Therapeutic activities;Cognitive remediation/compensation;Patient/family education;Balance training    OT Goals(Current goals can be found in the care plan  section) Acute Rehab OT Goals OT Goal Formulation: Patient unable to participate in goal setting Time For Goal Achievement: 10/27/21 Potential to Achieve Goals: Fair ADL Goals Pt Will Perform Eating: with min assist;sitting;bed level Pt Will Perform Grooming: with min assist;sitting Pt Will Perform Upper Body Dressing: with mod assist;sitting Pt Will Transfer to Toilet: with mod assist;stand pivot transfer;bedside commode Additional ADL Goal #1: Pt wil perform bed mobility with min assist in preparation for ADLs. Additional ADL Goal #2: Pt will demonstrate fair sitting balance in preparation for ADLs x 10 minutes.  OT Frequency: Min 2X/week    Co-evaluation PT/OT/SLP Co-Evaluation/Treatment: Yes Reason for Co-Treatment: For patient/therapist safety;Necessary to address cognition/behavior during functional activity   OT goals addressed during session: ADL's and self-care;Strengthening/ROM      AM-PAC OT "6 Clicks" Daily Activity     Outcome Measure Help from another person eating meals?: Total Help from another person taking care of personal grooming?: Total Help from another person toileting, which includes using toliet, bedpan, or urinal?: Total Help from another person bathing (including washing, rinsing, drying)?: Total Help from another person to put on and taking off regular upper body clothing?: Total Help from another person to put on and taking off regular  lower body clothing?: Total 6 Click Score: 6   End of Session Equipment Utilized During Treatment: Cervical collar Nurse Communication: Mobility status  Activity Tolerance: Patient limited by lethargy Patient left: in bed;with call bell/phone within reach;with bed alarm set;with nursing/sitter in room  OT Visit Diagnosis: Unsteadiness on feet (R26.81);Pain;Muscle weakness (generalized) (M62.81);Other symptoms and signs involving cognitive function                Time: 5525-8948 OT Time Calculation (min): 23  min Charges:  OT General Charges $OT Visit: 1 Visit OT Evaluation $OT Eval Moderate Complexity: 1 Mod  Nestor Lewandowsky, OTR/L Acute Rehabilitation Services Pager: [phone] Office: [phone]  Malka So 10/13/2021, 10:37 AM

## 2021-10-13 NOTE — Progress Notes (Signed)
Progress Note   Patient: Erin Beck UDJ:[id number] DOB: 11-17-1940 DOA: 10/12/2021     0 DOS: the patient was seen and examined on 10/13/2021   Brief hospital course: Erin Beck is a 81 y.o. female with medical history significant of hypertension, hyperlipidemia, CAD, diabetes mellitus type 2, GERD, and dementia who presents after being found down.  History is obtained from the patient's son who lives at [facility] as patient has significantly dementia.  The patient lives at home with her 2 older sons.  The patient has a lift chair in her home, but last night the power went out.  Her son suspects that she tried to go downstairs without the lift chair and fell.  She was found this morning on the floor by one of her sons after coming home from work.  Found to have multiple fractures of her cervical spine.    Assessment and Plan: * Multiple fractures of cervical spine (HCC) secondary to fall on and down steps- (present on admission) Patient found to have nondisplaced type II dens fracture, nondisplaced fracture of the right and left posterior arch of C1, and communicated fracture of the right anterior arch CT. neurosurgery had been consulted recommending conservative management with continuation of hard cervical collar for at least 3 months, and outpatient follow-up with them in 1 month along with imaging. Unclear cause of the fall at this time, but thought to be mechanical. -Neuro check -Continue cervical collar -PT to evaluate -Appreciate neuro's surgery consultative services.  Recommended outpatient follow-up with neurosurgery in 1 month with 2 view cervical radiographs  Hyponatremia Acute.  On admission sodium was noted to be 129.  However, after correcting for hyperglycemia sodium noted to be within normal limits at 139 and therefore pseudohyponatremia. -resolved  Leukocytosis- (present on admission) Initial labs significant for WBC 19.3.  Patient was otherwise noted to be  afebrile.  Chest -resolved  Dementia ([place])- (present on admission) Patient had previously been on Paxil 10 mg daily, but had been refusing to take the medication. -Delirium precautions -Bed alarm on -palliative care consult  Thrombocytosis Acute.  Thought to be likely reactive in nature. -resolved   Uncontrolled type 2 diabetes mellitus with hyperglycemia, without long-term current use of insulin ([place]) On admission patient was found to have blood sugars elevated up to 516.  She previously had been on glipizide and, but had not taken this in several months.   -Urinalysis however was positive for small amount of ketones and glucose.  She appears to be less likely in DKA and possibly more so in HHS.  Last available hemoglobin A1c was 14.2 from 11/24/2020 -CBGs before every meal and nightly with sensitive SSI -Normal saline IV fluids at 100 mL/h -Adjust insulin regimen as needed  HYPERTENSION, BENIGN ESSENTIAL- (present on admission) On admission patient was found to have blood pressures elevated up to 172/80.  She is not on any blood pressure medications due to stopping them several months ago.   - restart lisinopril 10 mg daily as it was one of the patients prior medications -Hydralazine IV as needed.     Poor overall prognosis- palliative care consult: suspect hospice needed   Subjective: confused, requiring stents   Physical Exam: Vitals:   10/12/21 1600 10/12/21 1937 10/13/21 0730 10/13/21 1327  BP: (!) 151/121 124/63 (!) 96/47 121/75  Pulse:  77 66 78  Resp:  20 16 20   Temp:  97.6 F (36.4 C) 98.3 F (36.8 C)   TempSrc:  Axillary  Axillary   SpO2:  93% 91% 94%  Weight:   59.1 kg   Height:       In bed, collar on Rrr No wheezing sleepy  Data Reviewed: Glucose 275  Family Communication: called son  Disposition: Status is: Inpatient Remains inpatient appropriate because: needs transition to comfort-- palliative care consult          Planned Discharge  Destination:  tbd    Time spent: 75 minutes  Author: Geradine Girt, DO 10/13/2021 1:27 PM  For on call review [email or web address].

## 2021-10-13 NOTE — TOC Initial Note (Signed)
Transition of Care [facility]) - Initial/Assessment Note    Patient Details  Name: Erin Beck MRN: [id number] Date of Birth: 11-22-1940  Transition of Care [facility]) CM/SW Contact:    Ninfa Meeker, RN Phone Number: 10/13/2021, 10:44 AM  Clinical Narrative:      Transition of Care Screening Note      Pt is an 81 y.o. female admitted 10/12/21 after being found down at home by son with unwitnessed fall. Has multiple fractures. Will need SNF for shortterm rehab. TOC Team will continue to monitor.      Patient Goals and CMS Choice        Expected Discharge Plan and Services                                                Prior Living Arrangements/Services                       Activities of Daily Living Home Assistive Devices/Equipment: Environmental consultant (specify type), Shower chair with back, Cane (specify quad or straight), Blood pressure cuff, Scales ADL Screening (condition at time of admission) Patient's cognitive ability adequate to safely complete daily activities?: No Is the patient deaf or have difficulty hearing?: Yes (left ear) Does the patient have difficulty seeing, even when wearing glasses/contacts?: Yes Does the patient have difficulty concentrating, remembering, or making decisions?: Yes Patient able to express need for assistance with ADLs?: Yes Does the patient have difficulty dressing or bathing?: No Independently performs ADLs?: No Communication: Needs assistance Is this a change from baseline?: Pre-admission baseline Dressing (OT): Needs assistance Is this a change from baseline?: Pre-admission baseline Grooming: Needs assistance Is this a change from baseline?: Pre-admission baseline Feeding: Independent Is this a change from baseline?: Pre-admission baseline Bathing: Needs assistance Is this a change from baseline?: Pre-admission baseline Toileting: Needs assistance In/Out Bed: Needs assistance Walks in Home: Needs assistance Is this a  change from baseline?: Pre-admission baseline Does the patient have difficulty walking or climbing stairs?: Yes Weakness of Legs: Both Weakness of Arms/Hands: None  Permission Sought/Granted                  Emotional Assessment              Admission diagnosis:  Hyperglycemia [R73.9] Fall in home, initial encounter [W19.XXXA, Y92.009] Multiple fractures of cervical spine ([place]) [S12.9XXA] Closed fracture of cervical vertebra, unspecified cervical vertebral level, initial encounter ([place]) [S12.9XXA] Patient Active Problem List   Diagnosis Date Noted   Multiple fractures of cervical spine ([place]) secondary to fall on and down steps 10/12/2021   Leukocytosis 10/12/2021   Hyponatremia 10/12/2021   Abnormal EKG 10/12/2021   Dementia ([place]) 11/24/2020   Right hip pain 04/12/2020   Urine color appears bright 03/28/2020   Chest wall pain 03/28/2020   Fall (on) (from) other stairs and steps, initial encounter 03/28/2020   Medicare annual wellness visit, subsequent 05/06/2019   Cerumen impaction 12/13/2017   Thrombocytosis 12/02/2017   Facial paresthesia 12/02/2017   Palpitations 04/02/2017   Atherosclerotic heart disease of native coronary artery with other forms of angina pectoris ([place]) 05/23/2016   Abnormal stress test 12/29/2015   Uncontrolled type 2 diabetes mellitus with hyperglycemia, without long-term current use of insulin ([place]) 06/17/2015   Routine general medical examination at a health care facility 06/14/2015   Hearing  loss of aging 06/14/2015   Osteoma of nasal sinus 06/18/2014   Chronic neck and back pain 06/15/2014   Vitreomacular adhesion 05/05/2013   Unspecified asthma(493.90) 04/23/2013   Personal history of colonic polyps 03/03/2013   Post-menopausal 10/17/2012   Class 1 obesity without serious comorbidity with body mass index (BMI) of 33.0 to 33.9 in adult 10/31/2011   IBS (irritable bowel syndrome) 08/10/2011   GERD 07/31/2010   Allergic rhinitis  12/05/2007   HYPERTENSION, BENIGN ESSENTIAL 02/05/2007   TOBACCO USE, QUIT 02/05/2007   Hyperlipidemia associated with type 2 diabetes mellitus ([place]) 01/31/2007   Anxiety 01/31/2007   Osteoarthritis 01/31/2007   DIVERTICULITIS, HX OF 01/31/2007   PCP:  Abner Greenspan, MD Pharmacy:   [facility] [place], [place] [address] Phone: [phone] Fax: [phone]  [id number] [place], [place] - [address] [place] [address] Phone: [phone] Fax: [phone]     Social Determinants of Health (SDOH) Interventions    Readmission Risk Interventions No flowsheet data found.

## 2021-10-13 NOTE — Progress Notes (Addendum)
Inpatient Diabetes Program Recommendations  AACE/ADA: New Consensus Statement on Inpatient Glycemic Control (2015)  Target Ranges:  Prepandial:   less than 140 mg/dL      Peak postprandial:   less than 180 mg/dL (1-2 hours)      Critically ill patients:  140 - 180 mg/dL   Lab Results  Component Value Date   GLUCAP 262 (H) 10/13/2021   HGBA1C 14.2 Repeated and verified X2. (H) 11/24/2020    Review of Glycemic Control  Latest Reference Range & Units 10/12/21 13:49 10/12/21 20:03 10/13/21 07:23  Glucose-Capillary 70 - 99 mg/dL 249 (H) 301 (H) 262 (H)  (H): Data is abnormally high Diabetes history: Type 2 DM Outpatient Diabetes medications: Glipizide 2.5 mg BID Current orders for Inpatient glycemic control: Novolog 0-9 units TID, Novolog 0-5 units QHS  Inpatient Diabetes Program Recommendations:    Consider adding Semglee 8 units QD.   Spoke with Erin Beck, patient's HCPOA to clarify plan and thoughts regarding diabetes management. Patient has been refusing to take medications given advancing confusion and dementia. [place]  Briefly reviewed patient's previous A1c of 14.2% from 11/2020 and would anticipate values to be similar if lab draw were repeated given challenge of taking medications. Explained what a A1c is and what it measures. Also reviewed goal A1c with patient, importance of good glucose control @ home, and blood sugar goals. Erin Beck is planning to meet with palliative on 2/25 and discuss options for Hospice. Provided support and our team as resource, however at this time anticipate low interventions for DM.  Thanks, Erin Curb, MSN, RNC-OB Diabetes Coordinator [phone] (8a-5p)

## 2021-10-13 NOTE — TOC Initial Note (Signed)
Transition of Care [facility]) - Initial/Assessment Note    Patient Details  Name: Erin Beck MRN: [id number] Date of Birth: December 17, 1940  Transition of Care [facility]) CM/SW Contact:    Joanne Chars, LCSW Phone Number: 10/13/2021, 4:01 PM  Clinical Narrative:   Pt not oriented, CSW spoke with son [name].  Pt lives with her two other sons, no home services in place currently.  They are all meeting with the palliative team tomorrow to discuss goals of care and will make final decision on DC plans after that conversation.  They are considering moving to comfort care.   TOC will continue to follow.                  Expected Discharge Plan:  (TBD) Barriers to Discharge: Continued Medical Work up, Other (must enter comment) (pending [place] conversation with palliative team)   Patient Goals and CMS Choice        Expected Discharge Plan and Services Expected Discharge Plan:  (TBD) In-house Referral: Clinical Social Work   Post Acute Care Choice:  (TBD) Living arrangements for the past 2 months: Single Family Home                                      Prior Living Arrangements/Services Living arrangements for the past 2 months: Single Family Home Lives with:: Adult Children          Need for Family Participation in Patient Care: Yes (Comment) Care giver support system in place?: Yes (comment) Current home services: Other (comment) (none) Criminal Activity/Legal Involvement Pertinent to Current Situation/Hospitalization: No - Comment as needed  Activities of Daily Living Home Assistive Devices/Equipment: Environmental consultant (specify type), Shower chair with back, Cane (specify quad or straight), Blood pressure cuff, Scales ADL Screening (condition at time of admission) Patient's cognitive ability adequate to safely complete daily activities?: No Is the patient deaf or have difficulty hearing?: Yes (left ear) Does the patient have difficulty seeing, even when wearing glasses/contacts?:  Yes Does the patient have difficulty concentrating, remembering, or making decisions?: Yes Patient able to express need for assistance with ADLs?: Yes Does the patient have difficulty dressing or bathing?: No Independently performs ADLs?: No Communication: Needs assistance Is this a change from baseline?: Pre-admission baseline Dressing (OT): Needs assistance Is this a change from baseline?: Pre-admission baseline Grooming: Needs assistance Is this a change from baseline?: Pre-admission baseline Feeding: Independent Is this a change from baseline?: Pre-admission baseline Bathing: Needs assistance Is this a change from baseline?: Pre-admission baseline Toileting: Needs assistance In/Out Bed: Needs assistance Walks in Home: Needs assistance Is this a change from baseline?: Pre-admission baseline Does the patient have difficulty walking or climbing stairs?: Yes Weakness of Legs: Both Weakness of Arms/Hands: None  Permission Sought/Granted                  Emotional Assessment Appearance:: Appears stated age Attitude/Demeanor/Rapport: Unable to Assess Affect (typically observed): Unable to Assess Orientation: :  (not oriented) Alcohol / Substance Use: Not Applicable Psych Involvement: No (comment)  Admission diagnosis:  Hyperglycemia [R73.9] Fall in home, initial encounter [W19.XXXA, Y92.009] Multiple fractures of cervical spine ([place]) [S12.9XXA] Closed fracture of cervical vertebra, unspecified cervical vertebral level, initial encounter ([place]) [S12.9XXA] Cervical spine fracture ([place]) [S12.9XXA] Patient Active Problem List   Diagnosis Date Noted   Multiple fractures of cervical spine ([place]) secondary to fall on and down steps 10/12/2021  Leukocytosis 10/12/2021   Hyponatremia 10/12/2021   Dementia ([place]) 11/24/2020   Right hip pain 04/12/2020   Urine color appears bright 03/28/2020   Chest wall pain 03/28/2020   Fall (on) (from) other stairs and steps, initial encounter  03/28/2020   Medicare annual wellness visit, subsequent 05/06/2019   Cerumen impaction 12/13/2017   Thrombocytosis 12/02/2017   Facial paresthesia 12/02/2017   Palpitations 04/02/2017   Atherosclerotic heart disease of native coronary artery with other forms of angina pectoris ([place]) 05/23/2016   Abnormal stress test 12/29/2015   Uncontrolled type 2 diabetes mellitus with hyperglycemia, without long-term current use of insulin ([place]) 06/17/2015   Routine general medical examination at a health care facility 06/14/2015   Hearing loss of aging 06/14/2015   Osteoma of nasal sinus 06/18/2014   Chronic neck and back pain 06/15/2014   Vitreomacular adhesion 05/05/2013   Unspecified asthma(493.90) 04/23/2013   Personal history of colonic polyps 03/03/2013   Post-menopausal 10/17/2012   Class 1 obesity without serious comorbidity with body mass index (BMI) of 33.0 to 33.9 in adult 10/31/2011   IBS (irritable bowel syndrome) 08/10/2011   GERD 07/31/2010   Allergic rhinitis 12/05/2007   HYPERTENSION, BENIGN ESSENTIAL 02/05/2007   TOBACCO USE, QUIT 02/05/2007   Hyperlipidemia associated with type 2 diabetes mellitus ([place]) 01/31/2007   Anxiety 01/31/2007   Osteoarthritis 01/31/2007   DIVERTICULITIS, HX OF 01/31/2007   PCP:  Abner Greenspan, MD Pharmacy:   [place], [place] [address] Phone: [phone] Fax: [phone]  [id number] [place], [place] - [address] [place] [address] Phone: [phone] Fax: [phone]     Social Determinants of Health (SDOH) Interventions    Readmission Risk Interventions No flowsheet data found.

## 2021-10-13 NOTE — Evaluation (Signed)
Physical Therapy Evaluation Patient Details Name: Erin Beck MRN: [id number] DOB: 08/20/1941 Today's Date: 10/13/2021  History of Present Illness  Pt is an 81 y.o. female admitted 10/12/21 after being found down at home by son with unwitnessed fall. Head/cervical CT revealed small L frontal hematoma, nondisplaced type II dens fx, nondisplaced fx R & L C1 posterior arch, communicated fx R anterior arch. Neurosx recommends non-op management with cervical collar. PMH includes dementia, HTN, HLD, CAD, DM2.   Clinical Impression  Pt presents with an overall decrease in functional mobility secondary to above. Pt poor historian with minimal verbalizations this session; per chart, from home with son. Today, pt requiring mod-totalA+2 for limited mobility; poor to fair sitting balance. Pt would benefit from continued acute PT services to maximize functional mobility and independence prior to d/c with SNF-level therapies.      Recommendations for follow up therapy are one component of a multi-disciplinary discharge planning process, led by the attending physician.  Recommendations may be updated based on patient status, additional functional criteria and insurance authorization.  Follow Up Recommendations Skilled nursing-short term rehab (<3 hours/day)    Assistance Recommended at Discharge Frequent or constant Supervision/Assistance  Patient can return home with the following  A lot of help with walking and/or transfers;A lot of help with bathing/dressing/bathroom;Assistance with cooking/housework;Assistance with feeding;Direct supervision/assist for medications management;Direct supervision/assist for financial management;Assist for transportation;Help with stairs or ramp for entrance    Equipment Recommendations  (TBD)  Recommendations for Other Services       Functional Status Assessment Patient has had a recent decline in their functional status and demonstrates the ability to make  significant improvements in function in a reasonable and predictable amount of time.     Precautions / Restrictions Precautions Precautions: Cervical;Fall;Other (comment) Precaution Comments: urinary incontinence Required Braces or Orthoses: Cervical Brace Cervical Brace: Hard collar;At all times      Mobility  Bed Mobility Overal bed mobility: Needs Assistance Bed Mobility: Supine to Sit, Sit to Supine     Supine to sit: Total assist, +2 for physical assistance, HOB elevated Sit to supine: Total assist, +2 for physical assistance        Transfers Overall transfer level: Needs assistance Equipment used: 2 person hand held assist Transfers: Sit to/from Stand Sit to Stand: Mod assist, +2 physical assistance           General transfer comment: assist to rise and steady with B knees blocked, stood x 2, pt unable to take side steps toward [facility]    Ambulation/Gait                  Stairs            Wheelchair Mobility    Modified Rankin (Stroke Patients Only)       Balance Overall balance assessment: Needs assistance   Sitting balance-Leahy Scale: Poor Sitting balance - Comments: mod initially, progressed to min guard assist for short periods Postural control: Posterior lean Standing balance support: Bilateral upper extremity supported Standing balance-Leahy Scale: Poor Standing balance comment: at least mod assist in static standing for changing brief and pericare                             Pertinent Vitals/Pain Pain Assessment Pain Assessment: Faces Faces Pain Scale: Hurts even more Pain Location: head/neck/shoulders - pt unable to specify Pain Descriptors / Indicators: Grimacing, Guarding, Moaning Pain Intervention(s): Monitored during session, Limited  activity within patient's tolerance, Repositioned    Home Living Family/patient expects to be discharged to:: Private residence Living Arrangements: Children                  Additional Comments: Per chart, from home with son    Prior Function Prior Level of Function : Patient poor historian/Family not available                     Hand Dominance        Extremity/Trunk Assessment   Upper Extremity Assessment Upper Extremity Assessment: Difficult to assess due to impaired cognition (strong grasp for HHA, tends to grab clothes)    Lower Extremity Assessment Lower Extremity Assessment: Difficult to assess due to impaired cognition (suspect slight R knee flexor contracture, stiff RLE with mobility; bilateral knee instability with standing)    Cervical / Trunk Assessment Cervical / Trunk Assessment: Kyphotic;Other exceptions Cervical / Trunk Exceptions: cervical fxs  Communication      Cognition Arousal/Alertness: Lethargic Behavior During Therapy: Flat affect, Restless Overall Cognitive Status: Impaired/Different from baseline Area of Impairment: Following commands, Safety/judgement, Awareness                       Following Commands: Follows one step commands inconsistently, Follows one step commands with increased time Safety/Judgement: Decreased awareness of safety, Decreased awareness of deficits     General Comments: Pt with eyes closed throughout session. Stated,"I am going through it," with urinary incontinence in standing, "Let go," when standing with hands held, "My head hurts," when returned to supine.        General Comments General comments (skin integrity, edema, etc.): +3 assist from NT/sitter helpful for completing ADL tasks    Exercises     Assessment/Plan    PT Assessment Patient needs continued PT services  PT Problem List Decreased strength;Decreased range of motion;Decreased activity tolerance;Decreased balance;Decreased mobility;Decreased cognition;Decreased knowledge of precautions       PT Treatment Interventions DME instruction;Gait training;Stair training;Functional mobility training;Therapeutic  activities;Therapeutic exercise;Balance training;Patient/family education;Cognitive remediation    PT Goals (Current goals can be found in the Care Plan section)  Acute Rehab PT Goals PT Goal Formulation: Patient unable to participate in goal setting Time For Goal Achievement: 10/27/21 Potential to Achieve Goals: Fair    Frequency Min 3X/week     Co-evaluation PT/OT/SLP Co-Evaluation/Treatment: Yes Reason for Co-Treatment: For patient/therapist safety;To address functional/ADL transfers;Necessary to address cognition/behavior during functional activity PT goals addressed during session: Mobility/safety with mobility;Balance         AM-PAC PT "6 Clicks" Mobility  Outcome Measure Help needed turning from your back to your side while in a flat bed without using bedrails?: Total Help needed moving from lying on your back to sitting on the side of a flat bed without using bedrails?: Total Help needed moving to and from a bed to a chair (including a wheelchair)?: Total Help needed standing up from a chair using your arms (e.g., wheelchair or bedside chair)?: Total Help needed to walk in hospital room?: Total Help needed climbing 3-5 steps with a railing? : Total 6 Click Score: 6    End of Session Equipment Utilized During Treatment: Cervical collar Activity Tolerance: Patient limited by lethargy Patient left: in bed;with call bell/phone within reach;with bed alarm set;with nursing/sitter in room Nurse Communication: Mobility status PT Visit Diagnosis: Other abnormalities of gait and mobility (R26.89);Muscle weakness (generalized) (M62.81);Pain    Time: 2876-8115 PT Time Calculation (min) (ACUTE  ONLY): 23 min   Charges:   PT Evaluation $PT Eval Moderate Complexity: [place], PT, DPT Acute Rehabilitation Services  Pager [phone] Office [place] 10/13/2021, 1:22 PM

## 2021-10-13 NOTE — Progress Notes (Signed)
Initial Nutrition Assessment  DOCUMENTATION CODES:   Not applicable  INTERVENTION:  - Given poor PO intake, recommend diet liberalization from carb modified to a regular diet to provide more options in hopes to enhance nutritional adequacy; reached out to MD who agreed  - Continue Ensure Enlive po BID, each supplement provides 350 kcal and 20 grams of protein.  NUTRITION DIAGNOSIS:   Inadequate oral intake related to other (see comment) (dementia) as evidenced by meal completion < 25%.  GOAL:   Patient will meet greater than or equal to 90% of their needs  MONITOR:   PO intake, Supplement acceptance, Diet advancement, Labs, Weight trends  REASON FOR ASSESSMENT:   Malnutrition Screening Tool    ASSESSMENT:   Pt from home d/t a fall down steps causing multiple fracture of cervical spine. PMH includes HTN, hyperlipidemia, CAD, T2DM, and dementia.  Neurosurgery recommendation of conservative management with continuation of hard cervical collar x3 months.  Pending palliative care discussion tomorrow 2/25 for [place] and possible transition to Hospice care.  Per review of chart, noted family report of decreased PO intake and often refusing uncertain foods. Reached out to LPN, who states pt consuming minimal PO and is primarily sleeping. 2 meal completions documented of 10% today.   Noted family report of pt refusing medicines and doctor's visits. This could be a likely contributor of high blood sugars rather than diet related, given documentation of poor PO intake and meal completions of 10-25%. Recommend diet liberalization from carb modified to regular diet as provide more options for nutritional intake.   Limited documentation of recent weight history. Pt has had an 8% weight loss within the last 10 months. Current weight noted to be 59.1 kg (130 lbs).  Medications: SSI, semglee 6 units.  Labs: sodium 129, CBG's 249-459 x 24 hours, HgbA1c (11/24/20) 14.2%  NUTRITION - FOCUSED  PHYSICAL EXAM: RD working remotely- deferred to follow up  Diet Order:   Diet Order             Diet Carb Modified Fluid consistency: Thin; Room service appropriate? Yes with Assist  Diet effective now                   EDUCATION NEEDS:   No education needs have been identified at this time  Skin:  Skin Assessment: Reviewed RN Assessment  Last BM:  2/23  Height:   Ht Readings from Last 1 Encounters:  10/12/21 5' (1.524 m)    Weight:   Wt Readings from Last 1 Encounters:  10/13/21 59.1 kg    BMI:  Body mass index is 25.45 kg/m.  Estimated Nutritional Needs:   Kcal:  1500-1700  Protein:  75-90g  Fluid:  >/=1.5L  Clayborne Dana, RDN, LDN Clinical Nutrition

## 2021-10-13 NOTE — Hospital Course (Addendum)
Erin Beck is a 81 y.o. female with medical history significant of hypertension, hyperlipidemia, CAD, diabetes mellitus type 2, GERD, and dementia who presents after being found down.  History was obtained from the patient's son who lives at [place] as patient has significantly dementia.  The patient lives at home with her 2 older sons.  The patient has a lift chair in her home, but last night the power went out.  Her son suspects that she tried to go downstairs without the lift chair and fell.  She was found this morning on the floor by one of her sons after coming home from work.  Found to have multiple fractures of her cervical spine.  Family met with palliative care and plan is transition to inpatient hospice.

## 2021-10-14 LAB — GLUCOSE, CAPILLARY
Glucose-Capillary: 176 mg/dL — ABNORMAL HIGH (ref 70–99)
Glucose-Capillary: 216 mg/dL — ABNORMAL HIGH (ref 70–99)
Glucose-Capillary: 129 mg/dL — ABNORMAL HIGH (ref 70–99)

## 2021-10-14 MED ORDER — GLYCOPYRROLATE 1 MG PO TABS
1.0000 mg | ORAL_TABLET | ORAL | Status: DC | PRN
  Filled 2021-10-14: qty 1

## 2021-10-14 MED ORDER — GLYCOPYRROLATE 0.2 MG/ML IJ SOLN
0.2000 mg | INTRAMUSCULAR | Status: DC | PRN
  Filled 2021-10-14: qty 1

## 2021-10-14 MED ORDER — HALOPERIDOL LACTATE 5 MG/ML IJ SOLN: 1.0000 mg | Freq: Four times a day (QID) | INTRAMUSCULAR | Status: DC | PRN

## 2021-10-14 MED ORDER — BIOTENE DRY MOUTH MT LIQD: 15.0000 mL | OROMUCOSAL | Status: DC | PRN

## 2021-10-14 MED ORDER — POLYVINYL ALCOHOL 1.4 % OP SOLN: 1.0000 [drp] | Freq: Four times a day (QID) | OPHTHALMIC | Status: DC | PRN

## 2021-10-14 MED ORDER — LORAZEPAM 2 MG/ML IJ SOLN
0.5000 mg | INTRAMUSCULAR | Status: DC | PRN
  Administered 2021-10-15 – 2021-10-17 (×8): 1 mg via INTRAVENOUS
  Filled 2021-10-14 (×8): qty 1

## 2021-10-14 MED ORDER — GLYCOPYRROLATE 0.2 MG/ML IJ SOLN
0.2000 mg | INTRAMUSCULAR | Status: DC | PRN
  Administered 2021-10-15: 0.2 mg via INTRAVENOUS
  Filled 2021-10-14 (×4): qty 1

## 2021-10-14 MED ORDER — HYDROMORPHONE HCL 1 MG/ML IJ SOLN
0.5000 mg | Freq: Four times a day (QID) | INTRAMUSCULAR | Status: DC
  Administered 2021-10-14 – 2021-10-17 (×12): 0.5 mg via INTRAVENOUS
  Filled 2021-10-14 (×12): qty 0.5

## 2021-10-14 MED ORDER — HYDROMORPHONE HCL 1 MG/ML IJ SOLN
0.5000 mg | INTRAMUSCULAR | Status: DC | PRN
  Administered 2021-10-15: 1 mg via INTRAVENOUS
  Administered 2021-10-16 (×2): 0.5 mg via INTRAVENOUS
  Administered 2021-10-17: 1 mg via INTRAVENOUS
  Filled 2021-10-14 (×4): qty 1

## 2021-10-14 NOTE — Progress Notes (Signed)
Progress Note   Patient: Erin Beck JME:[id number] DOB: [birth year] DOA: 10/12/2021     1 DOS: the patient was seen and examined on 10/14/2021   Brief hospital course: Erin Beck is a 81 y.o. female with medical history significant of hypertension, hyperlipidemia, CAD, diabetes mellitus type 2, GERD, and dementia who presents after being found down.  History is obtained from the patient's son who lives at [place] as patient has significantly dementia.  The patient lives at home with her 2 older sons.  The patient has a lift chair in her home, but last night the power went out.  Her son suspects that she tried to go downstairs without the lift chair and fell.  She was found this morning on the floor by one of her sons after coming home from work.  Found to have multiple fractures of her cervical spine.  Poor overall prognosis  Assessment and Plan: * Multiple fractures of cervical spine (HCC) secondary to fall on and down steps- (present on admission) Patient found to have nondisplaced type II dens fracture, nondisplaced fracture of the right and left posterior arch of C1, and communicated fracture of the right anterior arch CT. neurosurgery had been consulted recommending conservative management with continuation of hard cervical collar for at least 3 months, and outpatient follow-up with them in 1 month along with imaging. Unclear cause of the fall at this time, but thought to be mechanical. -Neuro check -Continue cervical collar -PT to evaluate -Appreciate neuro's surgery consultative services.  Recommended outpatient follow-up with neurosurgery in 1 month with 2 view cervical radiographs  Hyponatremia Acute.  On admission sodium was noted to be 129.  However, after correcting for hyperglycemia sodium noted to be within normal limits at 139 and therefore pseudohyponatremia. -resolved  Leukocytosis- (present on admission) Initial labs significant for WBC 19.3.  Patient was  otherwise noted to be afebrile.  Chest -resolved  Dementia ([place])- (present on admission) Patient had previously been on Paxil 10 mg daily, but had been refusing to take the medication. -Delirium precautions -Bed alarm on -palliative care consult for GOC  Thrombocytosis Acute.  Thought to be likely reactive in nature.   Uncontrolled type 2 diabetes mellitus with hyperglycemia, without long-term current use of insulin ([place]) On admission patient was found to have blood sugars elevated up to 516.  She previously had been on glipizide and, but had not taken this in several months.   -Urinalysis however was positive for small amount of ketones and glucose.  She appears to be less likely in DKA and possibly more so in HHS.  Last available hemoglobin A1c was 14.2 from 11/24/2020 -CBGs before every meal and nightly with sensitive SSI -Adjust insulin regimen as needed  HYPERTENSION, BENIGN ESSENTIAL- (present on admission) On admission patient was found to have blood pressures elevated up to 172/80.  She is not on any blood pressure medications due to stopping them several months ago.   - restart lisinopril 10 mg daily as it was one of the patients prior medications -Hydralazine IV as needed.     Subjective: in bed sideways, screaming wanting her collar removed  Physical Exam: Vitals:   10/12/21 1937 10/13/21 0730 10/13/21 1327 10/13/21 2052  BP: 124/63 (!) 96/47 121/75 (!) 117/97  Pulse: 77 66 78   Resp: 20 16 20 19   Temp: 97.6 F (36.4 C) 98.3 F (36.8 C)  97.9 F (36.6 C)  TempSrc: Axillary Axillary  Oral  SpO2: 93% 91% 94%  Weight:  59.1 kg    Height:        General: Appearance:     Frail female in no acute distress     Lungs:     respirations unlabored  Heart:    Normal heart rate.    MS:   All extremities are intact.    Neurologic:   Awake, screaming     Data Reviewed:   Family Communication: son- 2/24  Disposition: Status is: Inpatient Remains inpatient  appropriate because: need hospice care          Planned Discharge Destination:  tbd    Time spent: 65 minutes  Author: Geradine Girt, DO 10/14/2021 12:05 PM  For on call review [email or web address].

## 2021-10-14 NOTE — Progress Notes (Signed)
Manufacturing engineer [facility])  Referral received for [facility].  Eligibility will need to be confirmed, however we have no beds today.  Thank you, [name] BSN, RN [facility] Liaison

## 2021-10-14 NOTE — Progress Notes (Addendum)
NT did bladder scan. Bladder scan read "999", Then performed a straight cath to remove the urine. Ended up getting a 1220mls removed from bladder. Pt is asleep. Will continue to monitor

## 2021-10-14 NOTE — Progress Notes (Signed)
Palliative Medicine Inpatient Follow Up Note   HPI: Erin Beck is a 81 y.o. female with medical history significant of hypertension, hyperlipidemia, CAD, diabetes mellitus type 2, GERD, and dementia who presents after being found down.  History is obtained from the patient's son who lives at [place] as patient has significantly dementia.  The patient lives at home with her 2 older sons.  The patient has a lift chair in her home, but last night the power went out.  Her son suspects that she tried to go downstairs without the lift chair and fell.  She was found this morning on the floor by one of her sons after coming home from work.  Found to have multiple fractures of her cervical spine.  Poor overall prognosis  Today's Discussion (10/14/2021):  *Please note that this is a verbal dictation therefore any spelling or grammatical errors are due to the "[place] One" system interpretation.  Chart reviewed inclusive of vital signs, progress notes, laboratory results, and diagnostic images.   I met with patient's 3 sons this afternoon for a family meeting.  We reviewed Erin Beck's overall poor health state since her husband passed away in 03-24-2021.  All of her sons attest to the fact that her memory had worsened since then and she had made multiple comments stating that she feels like she is ready to die and join her husband.  I shared with patient's sons the different avenues of support and care we can take.  We reviewed that on one hand we could choose to be aggressive and send her to rehab with the hope that she would improve though I do wary in the setting of these multiple cervical spine fractures that she may not fare well in the long run.  Alternatively we discussed a comfort oriented approach.We talked about transition to comfort measures in house and what that would entail inclusive of medications to control pain, dyspnea, agitation, nausea, itching, and hiccups.  We  discussed stopping all uneccessary measures such as cardiac monitoring, blood draws, needle sticks, and frequent vital signs.  All 3 sons shared that this would be more aligned with what Erin Beck would want for herself if she were able to speak on her own behalf.  Created space and opportunity for patient to explore thoughts feelings and fears regarding current medical situation.  Patient's sons expressed that all of this happened so rapidly and it is hard to wrap their heads around.  Questions and concerns addressed   Palliative Support Provided  Reviewed the plan for patient to be discharged to patient hospice facility as I do believe she is less than 2 weeks of life left to live given her absence of food, hydration, and ongoing symptom support needs.  Objective Assessment: Vital Signs Vitals:   10/13/21 1327 10/13/21 2052  BP: 121/75 (!) 117/97  Pulse: 78   Resp: 20 19  Temp:  97.9 F (36.6 C)  SpO2: 94%     Intake/Output Summary (Last 24 hours) at 10/14/2021 1533 Last data filed at 10/14/2021 1445 Gross per 24 hour  Intake 1977.5 ml  Output 1400 ml  Net 577.5 ml   Last Weight  Most recent update: 10/13/2021  7:32 AM    Weight  59.1 kg (130 lb 4.7 oz)            Gen:  Elderly F in NAD HEENT: Dry  mucous membranes CV: Regular rate and rhythm  PULM:  On RA ABD: soft/nontender  EXT: No edema  Neuro: Disoriented  SUMMARY OF RECOMMENDATIONS   DNAR/DNI   Transition to comfort focused care  Will order low-dose Dilaudid every 6 hours around-the-clock to aid in relief of cervical neck pain  Additional medications per [facility]  Unrestricted visitation  Appreciate chaplain involvement  Consult to transitions of care team for inpatient hospice placement   Ongoing Palliative support and symptom management  MDM - High  Total Time: 27 ______________________________________________________________________________________ [place] Team Team Cell Phone: [phone] Please utilize secure chat with additional questions, if there is no response within 30 minutes please call the above phone number  Palliative Medicine Team providers are available by phone from 7am to 7pm daily and can be reached through the team cell phone.  Should this patient require assistance outside of these hours, please call the patient's attending physician.

## 2021-10-14 NOTE — Plan of Care (Signed)
  Problem: Safety: Goal: Non-violent Restraint(s) Outcome: Not Progressing   Problem: Education: Goal: Knowledge of General Education information will improve Description: Including pain rating scale, medication(s)/side effects and non-pharmacologic comfort measures Outcome: Not Progressing   Problem: Health Behavior/Discharge Planning: Goal: Ability to manage health-related needs will improve Outcome: Not Progressing   Problem: Clinical Measurements: Goal: Ability to maintain clinical measurements within normal limits will improve Outcome: Not Progressing Goal: Will remain free from infection Outcome: Not Progressing Goal: Diagnostic test results will improve Outcome: Not Progressing Goal: Respiratory complications will improve Outcome: Not Progressing Goal: Cardiovascular complication will be avoided Outcome: Not Progressing   Problem: Activity: Goal: Risk for activity intolerance will decrease Outcome: Not Progressing   Problem: Nutrition: Goal: Adequate nutrition will be maintained Outcome: Not Progressing   Problem: Coping: Goal: Level of anxiety will decrease Outcome: Not Progressing   Problem: Elimination: Goal: Will not experience complications related to bowel motility Outcome: Not Progressing Goal: Will not experience complications related to urinary retention Outcome: Not Progressing   Problem: Pain Managment: Goal: General experience of comfort will improve Outcome: Not Progressing   Problem: Safety: Goal: Ability to remain free from injury will improve Outcome: Not Progressing   Problem: Skin Integrity: Goal: Risk for impaired skin integrity will decrease Outcome: Not Progressing   

## 2021-10-14 NOTE — TOC Progression Note (Addendum)
Transition of Care [facility]) - Progression Note    Patient Details  Name: Erin Beck MRN: [id number] Date of Birth: [birth year]  Transition of Care [facility]) CM/SW Contact  [name], [name], [name] Phone Number: 10/14/2021, 3:36 PM  Clinical Narrative:    Patient's family agreeable to Inpatient Hospice. Phone call to patient's son [name] who has chosen [facility]. Spoke with [name], hospital liaison with [facility] regarding referral and bed availability. No beds available today, [name] agreeable to contacting patient's son.   [place], LCSW Transition of Care [phone]    Expected Discharge Plan:  (TBD) Barriers to Discharge: Continued Medical Work up, Other (must enter comment) (pending [name] conversation with palliative team)  Expected Discharge Plan and Services Expected Discharge Plan:  (TBD) In-house Referral: Clinical Social Work   Post Acute Care Choice:  (TBD) Living arrangements for the past 2 months: Single Family Home                                       Social Determinants of Health (SDOH) Interventions    Readmission Risk Interventions No flowsheet data found.

## 2021-10-15 NOTE — Progress Notes (Addendum)
° °  Palliative Medicine Inpatient Follow Up Note   HPI: Erin Beck is a 81 y.o. female with medical history significant of hypertension, hyperlipidemia, CAD, diabetes mellitus type 2, GERD, and dementia who presents after being found down.  History is obtained from the patient's son who lives at [place] as patient has significantly dementia.  The patient lives at home with her 2 older sons.  The patient has a lift chair in her home, but last night the power went out.  Her son suspects that she tried to go downstairs without the lift chair and fell.  She was found this morning on the floor by one of her sons after coming home from work.  Found to have multiple fractures of her cervical spine.  Poor overall prognosis  Today's Discussion (10/15/2021):  *Please note that this is a verbal dictation therefore any spelling or grammatical errors are due to the "[place] One" system interpretation.  Chart reviewed inclusive of vital signs, progress notes, laboratory results, and diagnostic images.   Per medication review patient has been receiving Dilaudid 0.5 mg every 6 hours as well as incremental doses of Norco.  This morning she received 1 dose of lorazepam for agitation.  Per conversation with patient's bedside RN, Brynda [place]athouse patient has experienced some ongoing anxiety requiring the use of mittens to inhibit the patient from pulling out her intravenous catheter  I met with Erin Beck at bedside.  She endorses no pain this morning in her spine.  She shares that he she had slept well.  Reviewed the plan for her to go to an inpatient hospice and the goal to keep her comfortable.  Although the patient seems incrementally altered she did agree with this plan.  Questions and concerns addressed.  Palliative Support Provided.  Patient's family will be updated via telephone.  Objective Assessment: Vital Signs Vitals:   10/13/21 2052 10/14/21 2122  BP: (!) 117/97 (!) 173/79  Pulse:  (!)  106  Resp: 19 18  Temp: 97.9 F (36.6 C) 98.4 F (36.9 C)  SpO2:  94%    Intake/Output Summary (Last 24 hours) at 10/15/2021 8638 Last data filed at 10/14/2021 1445 Gross per 24 hour  Intake --  Output 2750 ml  Net -2750 ml    Last Weight  Most recent update: 10/13/2021  7:32 AM    Weight  59.1 kg (130 lb 4.7 oz)            Gen:  Elderly F in NAD HEENT: Dry  mucous membranes CV: Regular rate and rhythm  PULM:  On RA ABD: soft/nontender  EXT: No edema  Neuro: Disoriented  SUMMARY OF RECOMMENDATIONS   DNAR/DNI   Transition to comfort focused care  Will order low-dose Dilaudid every 6 hours around-the-clock to aid in relief of cervical neck pain  Additional medications per [facility]  Unrestricted visitation  Appreciate chaplain involvement  Await [facility] at [facility]   Ongoing Palliative support and symptom management  Prognosis < 2 weeks  MDM - High ______________________________________________________________________________________ [place] Team Team Cell Phone: [phone] Please utilize secure chat with additional questions, if there is no response within 30 minutes please call the above phone number  Palliative Medicine Team providers are available by phone from 7am to 7pm daily and can be reached through the team cell phone.  Should this patient require assistance outside of these hours, please call the patient's attending physician.

## 2021-10-15 NOTE — Progress Notes (Addendum)
Engineer, maintenance [facility]) Hospital Liaison note.   Received request from [place] for family interest in [facility]. [facility] is unable to offer a room today. Hospital Liaison will follow up tomorrow or sooner if a room becomes available. Eligibility approved.   Please do not hesitate to call with questions.   Thank you, Clementeen Hoof, BSN, RN  [place] (listed on AMION under Hospice and [place] of [phone]   [phone]

## 2021-10-15 NOTE — Progress Notes (Signed)
°Progress Note ° ° °Patient: Erin Beck MRN:[id number] DOB: 08/12/1941 DOA: 10/12/2021     2 °DOS: the patient was seen and examined on 10/15/2021 °  °Brief hospital course: °Dewayne S Nickle is a 81 y.o. female with medical history significant of hypertension, hyperlipidemia, CAD, diabetes mellitus type 2, GERD, and dementia who presents after being found down.  History is obtained from the patient's son who lives at [place] as patient has significantly dementia.  The patient lives at home with her 2 older sons.  The patient has a lift chair in her home, but last night the power went out.  Her son suspects that she tried to go downstairs without the lift chair and fell.  She was found this morning on the floor by one of her sons after coming home from work.  Found to have multiple fractures of her cervical spine.  Family met with palliative care and plan is transition to inpatient hospice. ° °Assessment and Plan: °* Multiple fractures of cervical spine (HCC) secondary to fall on and down steps- (present on admission) °Patient found to have nondisplaced type II dens fracture, nondisplaced fracture of the right and left posterior arch of C1, and communicated fracture of the right anterior arch CT. neurosurgery had been consulted recommending conservative management with continuation of hard cervical collar for at least 3 months, and outpatient follow-up with them in 1 month along with imaging. Unclear cause of the fall at this time, but thought to be mechanical. °-transitioned to comfort care ° °Hyponatremia °Acute.  On admission sodium was noted to be 129.  However, after correcting for hyperglycemia sodium noted to be within normal limits at 139 and therefore pseudohyponatremia. °-resolved ° °Leukocytosis- (present on admission) °Initial labs significant for WBC 19.3.  Patient was otherwise noted to be afebrile.  Chest -resolved °-transitioned to comfort care ° °Dementia (HCC)- (present on  admission) °Patient had previously been on Paxil 10 mg daily, but had been refusing to take the medication. °-Delirium precautions °-Bed alarm on °-palliative care consult for GOC: -transitioned to comfort care ° °Thrombocytosis °Acute.  Thought to be likely reactive in nature. °-transitioned to comfort care ° °Uncontrolled type 2 diabetes mellitus with hyperglycemia, without long-term current use of insulin (HCC) °On admission patient was found to have blood sugars elevated up to 516.  She previously had been on glipizide and, but had not taken this in several months.   °-transitioned to comfort care ° °HYPERTENSION, BENIGN ESSENTIAL- (present on admission) °On admission patient was found to have blood pressures elevated up to 172/80.  She is not on any blood pressure medications due to stopping them several months ago.   °-transitioned to comfort care ° ° °Subjective: transitioned to comfort care yesterday ° °Physical Exam: °Vitals:  ° 10/13/21 0730 10/13/21 1327 10/13/21 2052 10/14/21 2122  °BP: (!) 96/47 121/75 (!) 117/97 (!) 173/79  °Pulse: 66 78  (!) 106  °Resp: 16 20 19 18  °Temp: 98.3 °F (36.8 °C)  97.9 °F (36.6 °C) 98.4 °F (36.9 °C)  °TempSrc: Axillary  Oral Oral  °SpO2: 91% 94%  94%  °Weight: 59.1 kg     °Height:      ° °Did not wake to voice this AM ° °Data Reviewed: °Comfort care ° ° °Disposition: °Status is: Inpatient °Remains inpatient appropriate because: needs residential hospice ° ° ° ° ° ° ° ° ° Planned Discharge Destination:  residential hospice ° ° °Time spent: 45 minutes ° °Author: ° U ,   Eliseo Squires, DO 10/15/2021 9:55 AM  For on call review [email or web address].

## 2021-10-15 NOTE — Progress Notes (Signed)
Son at bedside, updated by palliative team this am. Needs addressed.

## 2021-10-16 ENCOUNTER — Ambulatory Visit (INDEPENDENT_AMBULATORY_CARE_PROVIDER_SITE_OTHER): Payer: Self-pay | Admitting: *Deleted

## 2021-10-16 DIAGNOSIS — E1159 Type 2 diabetes mellitus with other circulatory complications: Secondary | ICD-10-CM

## 2021-10-16 DIAGNOSIS — I10 Essential (primary) hypertension: Secondary | ICD-10-CM

## 2021-10-16 DIAGNOSIS — Y92009 Unspecified place in unspecified non-institutional (private) residence as the place of occurrence of the external cause: Secondary | ICD-10-CM

## 2021-10-16 DIAGNOSIS — W19XXXA Unspecified fall, initial encounter: Secondary | ICD-10-CM

## 2021-10-16 DIAGNOSIS — R413 Other amnesia: Secondary | ICD-10-CM

## 2021-10-16 NOTE — Progress Notes (Signed)
Progress Note   Patient: Erin Beck KDT:[id number] DOB: 1940/08/27 DOA: 10/12/2021     3 DOS: the patient was seen and examined on 10/16/2021   Brief hospital course: Erin Beck is a 81 y.o. female with medical history significant of hypertension, hyperlipidemia, CAD, diabetes mellitus type 2, GERD, and dementia who presents after being found down.  History is obtained from the patient's son who lives at [facility] as patient has significantly dementia.  The patient lives at home with her 2 older sons.  The patient has a lift chair in her home, but last night the power went out.  Her son suspects that she tried to go downstairs without the lift chair and fell.  She was found this morning on the floor by one of her sons after coming home from work.  Found to have multiple fractures of her cervical spine.  Family met with palliative care and plan is transition to inpatient hospice.  Assessment and Plan: * Multiple fractures of cervical spine (HCC) secondary to fall on and down steps- (present on admission) Patient found to have nondisplaced type II dens fracture, nondisplaced fracture of the right and left posterior arch of C1, and communicated fracture of the right anterior arch CT. neurosurgery had been consulted recommending conservative management with continuation of hard cervical collar for at least 3 months, and outpatient follow-up with them in 1 month along with imaging. Unclear cause of the fall at this time, but thought to be mechanical. -transitioned to comfort care  Hyponatremia Acute.  On admission sodium was noted to be 129.  However, after correcting for hyperglycemia sodium noted to be within normal limits at 139 and therefore pseudohyponatremia. -resolved  Leukocytosis- (present on admission) Initial labs significant for WBC 19.3.  Patient was otherwise noted to be afebrile.  Chest -resolved -transitioned to comfort care  Dementia [facility])- (present on  admission) Patient had previously been on Paxil 10 mg daily, but had been refusing to take the medication. -Delirium precautions -Bed alarm on -palliative care consult for GOC: -transitioned to comfort care  Thrombocytosis Acute.  Thought to be likely reactive in nature. -transitioned to comfort care  Uncontrolled type 2 diabetes mellitus with hyperglycemia, without long-term current use of insulin ([place]) On admission patient was found to have blood sugars elevated up to 516.  She previously had been on glipizide and, but had not taken this in several months.   -transitioned to comfort care  HYPERTENSION, BENIGN ESSENTIAL- (present on admission) On admission patient was found to have blood pressures elevated up to 172/80.  She is not on any blood pressure medications due to stopping them several months ago.   -transitioned to comfort care     Subjective: appears comfortable  Physical Exam: Vitals:   10/13/21 2052 10/14/21 2122 10/15/21 1953 10/16/21 0809  BP: (!) 117/97 (!) 173/79 130/67 121/75  Pulse:  (!) 106 88 70  Resp: _0 Temp: 97.9 F (36.6 C) 98.4 F (36.9 C) (!) 97.5 F (36.4 C) 97.6 F (36.4 C)  TempSrc: Oral Oral Oral Oral  SpO2:  94% 90% 91%  Weight:      Height:       In bed, snoring respirations Appears comfortable    Disposition: Status is: Inpatient Remains inpatient appropriate because: needs residential hospice          Planned Discharge Destination: residential hospice   Time spent: 15 minutes  Author: Geradine Girt, DO 10/16/2021 10:48 AM  For on call review [email or web address].

## 2021-10-16 NOTE — Progress Notes (Signed)
Daily Progress Note   Patient Name: Erin Beck       Date: 10/16/2021 DOB: [birth year]  Age: 81 y.o. MRN#: [id number] Attending Physician: Geradine Girt, DO Primary Care Physician: Tower, [name], MD Admit Date: 10/12/2021 Length of Stay: 3 days  Reason for Consultation/Follow-up: Pain control and Terminal Care  HPI/Patient Profile:  Erin Beck is a 81 y.o. female with medical history significant of hypertension, hyperlipidemia, CAD, diabetes mellitus type 2, GERD, and dementia who presents after being found down.  History is obtained from the patient's son who lives at [place] as patient has significantly dementia.  The patient lives at home with her 2 older sons.  The patient has a lift chair in her home, but last night the power went out.  Her son suspects that she tried to go downstairs without the lift chair and fell.  She was found this morning on the floor by one of her sons after coming home from work.  Found to have multiple fractures of her cervical spine.  Poor overall prognosis.  On 2/26 the patient was transitioned to comfort care.  PMT initially consulted for [place], not continued support for pain management and end of life care.  Subjective:   Subjective: Chart Reviewed. Updates received. Patient Assessed. Created space and opportunity for patient and family to explore thoughts and feelings regarding current medical situation.  Today's Discussion: The patient was sleeping and I elected not to wake her due to being on comfort care.  Noted regular respirations in no obvious distress.  I spoke with the nurse who indicated this morning she was crawling out of bed and was given Ativan and Dilaudid for pain and agitation.  Since then she has been calm and comfortable.  Discussed current controlled substance orders and requested the nurse to notify us of any changes or additional symptom management needs are identified.  Review of Systems  Unable to perform ROS:  Acuity of condition   Objective:   Vital Signs:  BP 121/75 (BP Location: Right Arm)    Pulse 70    Temp 97.6 F (36.4 C) (Oral)    Resp 18    Ht 5' (1.524 m)    Wt 59.1 kg    SpO2 91%    BMI 25.45 kg/m   Physical Exam: Physical Exam Constitutional:      General: She is sleeping. She is not in acute distress.    Appearance: She is ill-appearing.  HENT:     Head: Normocephalic and atraumatic.  Pulmonary:     Effort: Pulmonary effort is normal. No respiratory distress.    Palliative Assessment/Data: 10%   Assessment & Plan:   Impression: Present on Admission:  Multiple fractures of cervical spine ([place]) secondary to fall on and down steps  Leukocytosis  Dementia (HCC)  HYPERTENSION, BENIGN ESSENTIAL  81 year old female admitted for multiple fractures of the cervical spine secondary to fall, leukocytosis, baseline dementia.  She was transitioned to comfort care yesterday.  Today she appears comfortable and continues on parenteral controlled substances for symptom management.  Currently pending inpatient hospice bed availability for transfer versus in-hospital death.  SUMMARY OF RECOMMENDATIONS   Continue DNR Continue comfort care Comfort medication orders as per below Unrestricted visitation Await placement at [facility] residential [facility] PMT will continue to follow  Symptom Management:  Robinul 0.2 mg IV every 4 hours as needed for excessive secretions Haldol 1 mg IV as needed for agitation Hydrocodone-APAP 5-325 mg p.o. every 6  hours as needed for pain Dilaudid 0.5 mg IV every 6 hours scheduled for pain Dilaudid 0.5 to 1 mg IV every hour as needed for dyspnea/pain Ativan 0.5 to 1 mg IV every hour as needed anxiety Zofran 4 mg IV every 6 hours as needed nausea/vomiting  Code Status: DNR  Prognosis: < 2 weeks  Discharge Planning: Hospice facility  Discussed with: Medical team, nursing team  Thank you for allowing [place] to participate in the care of [name] PMT will continue to support holistically.  Billing based on MDM: High  Problems Addressed: One acute or chronic illness or injury that poses a threat to life or bodily function  Amount and/or Complexity of Data: Category 3:Discussion of management or test interpretation with external physician/other qualified health care professional/appropriate source (not separately reported)  Risks: Parenteral controlled substances   Walden Field, NP Palliative Medicine Team  Team Phone # [phone] (Nights/Weekends)  04/18/2021, 8:17 AM

## 2021-10-16 NOTE — Patient Instructions (Signed)
Visit Information  Thank you for taking time to visit with me today. Please don't hesitate to contact me if I can be of assistance to you       Please call the care guide team at [phone] if you need to cancel or reschedule your appointment.   If you are experiencing a Mental Health or [place] or need someone to talk to, please go to [facility] [place] [phone])   The patient verbalized understanding of instructions, educational materials, and care plan provided today and declined offer to receive copy of patient instructions, educational materials, and care plan.   Eduard Clos MSW, LCSW Licensed Clinical Social Worker [place]   [phone]

## 2021-10-16 NOTE — Care Management Important Message (Signed)
Important Message  Patient Details  Name: Erin Beck MRN: [id number] Date of Birth: 1941/01/11   Medicare Important Message Given:  Yes     Hannah Beat 10/16/2021, 12:27 PM

## 2021-10-16 NOTE — Chronic Care Management (AMB) (Signed)
Chronic Care Management    Clinical Social Work Note  10/16/2021 Name: Erin Beck MRN: [id number] DOB: 1940/12/21  Erin Beck is a 81 y.o. year old female who is a primary care patient of Tower, [name], MD. The CCM team was consulted to assist the patient with chronic disease management and/or care coordination needs related to: [employer]  and Level of Care Concerns.   Engaged with patient by telephone for follow up visit in response to provider referral for social work chronic care management and care coordination services.   Consent to Services:  The patient was given information about Chronic Care Management services, agreed to services, and gave verbal consent prior to initiation of services.  Please see initial visit note for detailed documentation.   Patient agreed to services and consent obtained.   Assessment: Review of patient past medical history, allergies, medications, and health status, including review of relevant consultants reports was performed today as part of a comprehensive evaluation and provision of chronic care management and care coordination services.     SDOH (Social Determinants of Health) assessments and interventions performed:    Advanced Directives Status: Not addressed in this encounter.  CCM Care Plan  Allergies  Allergen Reactions   Naproxen Rash    Trouble breathing   Naproxen Sodium Rash    Trouble breathing   Aspirin     REACTION: burns stomach PT STATES SHE DOES TOLERATE THE 81 MG ASPIRIN DAILY   Atorvastatin     REACTION: muscle pain   Buspar [Buspirone Hcl]     Multiple side eff   Gabapentin Other (See Comments)    Palpitations/ dizziness    Metformin And Related     Mm aches, palpitations, SOB, worse eyesight, loss of balance, sweating, low CBGs   Metoprolol Other (See Comments)    dizziness   Norco [Hydrocodone-Acetaminophen] Other (See Comments)    Dizzy and problems remembering    Pravastatin     Leg pain    Simvastatin     REACTION: GI side eff and swelling   Sulfa Antibiotics Other (See Comments)    Severe joint pain   Sulfonamide Derivatives     REACTION: reaction not known   Zetia [Ezetimibe]     Caused dizziness    Facility-Administered Encounter Medications as of 10/16/2021  Medication   0.9 %  sodium chloride infusion   acetaminophen (TYLENOL) tablet 650 mg   Or   acetaminophen (TYLENOL) suppository 650 mg   albuterol (PROVENTIL) (2.5 MG/3ML) 0.083% nebulizer solution 2.5 mg   antiseptic oral rinse (BIOTENE) solution 15 mL   glycopyrrolate (ROBINUL) tablet 1 mg   Or   glycopyrrolate (ROBINUL) injection 0.2 mg   Or   glycopyrrolate (ROBINUL) injection 0.2 mg   haloperidol lactate (HALDOL) injection 1 mg   HYDROcodone-acetaminophen (NORCO/VICODIN) 5-325 MG per tablet 1 tablet   HYDROmorphone (DILAUDID) injection 0.5 mg   HYDROmorphone (DILAUDID) injection 0.5-1 mg   lisinopril (ZESTRIL) tablet 10 mg   LORazepam (ATIVAN) injection 0.5-1 mg   ondansetron (ZOFRAN) tablet 4 mg   Or   ondansetron (ZOFRAN) injection 4 mg   polyvinyl alcohol (LIQUIFILM TEARS) 1.4 % ophthalmic solution 1 drop   Outpatient Encounter Medications as of 10/16/2021  Medication Sig   ACCU-CHEK FASTCLIX LANCETS MISC TEST FOUR TIMES DAILY AS DIRECTED   acetaminophen (TYLENOL) 325 MG tablet Take 650 mg by mouth every 6 (six) hours as needed (pain).   Blood Glucose Monitoring Suppl (ONETOUCH VERIO) w/Device KIT  1 kit by Does not apply route as directed.   donepezil (ARICEPT) 10 MG tablet Take 1 tablet (10 mg total) by mouth at bedtime. (Patient not taking: Reported on 10/12/2021)   glipiZIDE (GLUCOTROL) 5 MG tablet Take 0.5 tablets (2.5 mg total) by mouth 2 (two) times daily before a meal. (Patient not taking: Reported on 10/12/2021)   glucose blood (ACCU-CHEK GUIDE) test strip Use as instructed to test once daily   hydrochlorothiazide (HYDRODIURIL) 25 MG tablet TAKE 1 TABLET EVERY DAY (Patient not taking:  Reported on 10/12/2021)   Lancets (ONETOUCH ULTRASOFT) lancets Use as instructed   lisinopril (ZESTRIL) 10 MG tablet TAKE 1 TABLET EVERY DAY (Patient not taking: Reported on 10/12/2021)   PARoxetine (PAXIL) 10 MG tablet Take 1 tablet (10 mg total) by mouth daily. (Patient not taking: Reported on 10/12/2021)   rosuvastatin (CRESTOR) 10 MG tablet TAKE 1 TABLET ON MONDAY AND THURSDAY (Patient not taking: Reported on 10/12/2021)    Patient Active Problem List   Diagnosis Date Noted   Multiple fractures of cervical spine ([place]) secondary to fall on and down steps 10/12/2021   Leukocytosis 10/12/2021   Hyponatremia 10/12/2021   Dementia ([place]) 11/24/2020   Right hip pain 04/12/2020   Urine color appears bright 03/28/2020   Chest wall pain 03/28/2020   Fall (on) (from) other stairs and steps, initial encounter 03/28/2020   Medicare annual wellness visit, subsequent 05/06/2019   Cerumen impaction 12/13/2017   Thrombocytosis 12/02/2017   Facial paresthesia 12/02/2017   Palpitations 04/02/2017   Atherosclerotic heart disease of native coronary artery with other forms of angina pectoris ([place]) 05/23/2016   Abnormal stress test 12/29/2015   Uncontrolled type 2 diabetes mellitus with hyperglycemia, without long-term current use of insulin ([place]) 06/17/2015   Routine general medical examination at a health care facility 06/14/2015   Hearing loss of aging 06/14/2015   Osteoma of nasal sinus 06/18/2014   Chronic neck and back pain 06/15/2014   Vitreomacular adhesion 05/05/2013   Unspecified asthma(493.90) 04/23/2013   Personal history of colonic polyps 03/03/2013   Post-menopausal 10/17/2012   Class 1 obesity without serious comorbidity with body mass index (BMI) of 33.0 to 33.9 in adult 10/31/2011   IBS (irritable bowel syndrome) 08/10/2011   GERD 07/31/2010   Allergic rhinitis 12/05/2007   HYPERTENSION, BENIGN ESSENTIAL 02/05/2007   TOBACCO USE, QUIT 02/05/2007   Hyperlipidemia associated with type  2 diabetes mellitus ([place]) 01/31/2007   Anxiety 01/31/2007   Osteoarthritis 01/31/2007   DIVERTICULITIS, HX OF 01/31/2007    Conditions to be addressed/monitored: HTN and DMII; Level of care concerns  Care Plan : LCSW Plan of Care  Updates made by Deirdre Peer, LCSW since 10/16/2021 12:00 AM     Problem: Long Term care needs to ensure safety and well-being   Priority: High     Long-Range Goal: Provide support, resources and guidance for long-term care needs Completed 10/16/2021  Start Date: 04/20/2021  Expected End Date: 08/19/2021  Recent Progress: On track  Priority: High  Note:   Current barriers:    Limited social support, Level of care concerns, Limited access to caregiver, Cognitive Deficits, Memory Deficits, and Lacks knowledge of community resource:   Clinical Goals: Patient will work with  CSW   to address needs related to long term care need planning Clinical Interventions:   10/16/21- CSW spoke with pt's son who reports pt is currently hospitalized and will be transitioning to comfort care with plans to be moved to hospice home.  Pt's son is very appreciative of all the help and support offered by our team.  CSW offered emotional support and reassurance for pt's comfort and care that will be provided.   06/06/21- CSW spoke with pt's son who reports pt continues to refuse to take medications or to go to her PCP visit. "She locked herself in her room.... ".  CSW will reach out to PCP and Pharmacist to inquire about possible options to aide in her behaviors- refusal/irritability, etc that may be able to be "taken" in alternative ways since she won't take pills.  Son reports he is coming next Monday and she has been asking to go see the lawyer who helped her  in the past- son hopes to pursue Guardianship.  CSW had a lengthy initial outreach call to pt's son, Karma Greaser, who is the [place] and lives out of state.  Son reports he has 2 brothers who still live in the home with pt. Pt  recently lost her husband of 18 years and per son/HCPOA she has gotten worse since. Per son, she has become more paranoid; thinking someone is stealing from her and has had more memory decline. Son is coming to take her to see PCP later this month and will discuss this further. CSW discussed with son the different options for long term care options an consideration. At this point, he does not want to pursue placement of pt as he thinks this will be too much of a shock and further impact her overall well-being. Her sons that live with her work nights and thus she is home alone some- encouraged son to consider safety options to aide in her safety when alone; life alert, camera's,etc.  CSW also discussed the option of in-home caregivers and that this is a private pay options given she is not eligible for Medicaid and not aware of a long term care insurance policy.  CSW discussed memory care facilities; average cost, etc.  Son to discuss further with brothers and PCP and will plan to talk further with CSW 9/21. 1:1 collaboration with primary care provider regarding development and update of comprehensive plan of care as evidenced by provider attestation and co-signature Inter-disciplinary care team collaboration (see longitudinal plan of care) Assessment of needs, barriers , agencies contacted, as well as how impacting Review various resources, discussed options and provided patient information about  Private pay options home health needs and Dementia resources and support  Solution-Focused Strategies, Active listening / Reflection utilized , Emotional Supportive Provided, Problem Solving [place] , Provided psychoeducation for mental health needs , and Caregiver stress acknowledged  Patient Goals/Self-Care Activities: Over the next 30 days Discuss treatment plans further with PCP (RX, referral for dementia, etc) Meet with Attorney as planned and discussed - begin a notebook of services in my neighborhood  or community - call 211 when I need some help - follow-up on any referrals for help I am given - think ahead to make sure my need does not become an emergency - make a note about what I need to have by the phone or take with me, like an identification card or social security number have a back-up plan - have a back-up plan - make a list of family or friends that I can call         Follow Up Plan:  N/A      Eduard Clos MSW, [place] Licensed Clinical Social Worker [place]   [phone]

## 2021-10-16 NOTE — Progress Notes (Signed)
[facility]) Hospital Liaison note.   Received request from [place] for family interest in [facility]. [facility] is unable to offer a room today. Hospital Liaison will follow up tomorrow or sooner if a room becomes available.   Please do not hesitate to call with questions.   Thank you,  Clementeen Hoof, BSN, RN [place] (listed on AMION under Hospice and [facility] [place] [phone]   [phone]

## 2021-10-17 DIAGNOSIS — Z515 Encounter for palliative care: Secondary | ICD-10-CM

## 2021-10-17 DIAGNOSIS — E1159 Type 2 diabetes mellitus with other circulatory complications: Secondary | ICD-10-CM

## 2021-10-17 DIAGNOSIS — I10 Essential (primary) hypertension: Secondary | ICD-10-CM

## 2021-10-17 MED ORDER — HYDROMORPHONE HCL 1 MG/ML IJ SOLN: 0.5000 mg | Freq: Four times a day (QID) | INTRAMUSCULAR | 0 refills | Status: AC

## 2021-10-17 MED ORDER — POLYVINYL ALCOHOL 1.4 % OP SOLN: 1.0000 [drp] | Freq: Four times a day (QID) | OPHTHALMIC | 0 refills | Status: AC | PRN

## 2021-10-17 MED ORDER — HYDROMORPHONE HCL 1 MG/ML IJ SOLN: 0.5000 mg | INTRAMUSCULAR | 0 refills | Status: AC | PRN

## 2021-10-17 MED ORDER — GLYCOPYRROLATE 0.2 MG/ML IJ SOLN: 0.2000 mg | INTRAMUSCULAR | Status: AC | PRN

## 2021-10-17 MED ORDER — LORAZEPAM 2 MG/ML IJ SOLN: 0.5000 mg | INTRAMUSCULAR | 0 refills | Status: AC | PRN

## 2021-10-17 NOTE — Progress Notes (Signed)
[facility] ([facility]) Hospital Liaison note.     This patient is approved to transfer to [facility] today.    [facility] will notify TOC when registration paperwork has been completed to arrange transport.    RN please call report to [phone].   Thank you,     Mary Anne Robertson, RN, CCM       [facility] Hospital Liaison  [phone] 

## 2021-10-17 NOTE — Progress Notes (Addendum)
Progress Note   Patient: Erin Beck LNL:[id number] DOB: [birth year] DOA: 10/12/2021     4 DOS: the patient was seen and examined on 10/17/2021   Brief hospital course: Erin Beck is a 81 y.o. female with medical history significant of hypertension, hyperlipidemia, CAD, diabetes mellitus type 2, GERD, and dementia who presents after being found down.  History was obtained from the patient's son who lives at [place] as patient has significantly dementia.  The patient lives at home with her 2 older sons.  The patient has a lift chair in her home, but last night the power went out.  Her son suspects that she tried to go downstairs without the lift chair and fell.  She was found this morning on the floor by one of her sons after coming home from work.  Found to have multiple fractures of her cervical spine.  Family met with palliative care and plan is transition to inpatient hospice.  Assessment and Plan: * Multiple fractures of cervical spine (HCC) secondary to fall on and down steps- (present on admission) Patient found to have nondisplaced type II dens fracture, nondisplaced fracture of the right and left posterior arch of C1, and communicated fracture of the right anterior arch CT. neurosurgery had been consulted recommending conservative management with continuation of hard cervical collar for at least 3 months, and outpatient follow-up with them in 1 month along with imaging. Unclear cause of the fall at this time, but thought to be mechanical. -transitioned to comfort care  Hyponatremia Acute.  On admission sodium was noted to be 129.  However, after correcting for hyperglycemia sodium noted to be within normal limits at 139 and therefore pseudohyponatremia. -resolved  Leukocytosis- (present on admission) Initial labs significant for WBC 19.3.  Patient was otherwise noted to be afebrile.  Chest -resolved -transitioned to comfort care  Dementia [facility])- (present on  admission) Patient had previously been on Paxil 10 mg daily, but had been refusing to take the medication. -Delirium precautions -Bed alarm on -palliative care consult for GOC: -transitioned to comfort care  Thrombocytosis Acute.  Thought to be likely reactive in nature. -transitioned to comfort care  Uncontrolled type 2 diabetes mellitus with hyperglycemia, without long-term current use of insulin ([place]) On admission patient was found to have blood sugars elevated up to 516.  She previously had been on glipizide and, but had not taken this in several months.   -transitioned to comfort care  HYPERTENSION, BENIGN ESSENTIAL- (present on admission) On admission patient was found to have blood pressures elevated up to 172/80.  She is not on any blood pressure medications due to stopping them several months ago.   -transitioned to comfort care     Subjective: not responsive to voice  Physical Exam: Vitals:   10/13/21 2052 10/14/21 2122 10/15/21 1953 10/16/21 0809  BP: (!) 117/97 (!) 173/79 130/67 121/75  Pulse:  (!) 106 88 70  Resp: _0 Temp: 97.9 F (36.6 C) 98.4 F (36.9 C) (!) 97.5 F (36.4 C) 97.6 F (36.4 C)  TempSrc: Oral Oral Oral Oral  SpO2:  94% 90% 91%  Weight:      Height:       In bed, collar on Not responsive to voice  Data Reviewed: No labs as comfort care focused   Disposition: Status is: Inpatient Remains inpatient appropriate because: needs residential hospice vs inpt death          Planned Discharge Destination:  tbd  Time spent: 55 minutes  Author: Geradine Girt, DO 10/17/2021 9:13 AM  For on call review [email or web address].

## 2021-10-17 NOTE — TOC Transition Note (Signed)
Transition of Care [facility] - CM/SW Discharge Note   Patient Details  Name: Erin Beck MRN: [id number] Date of Birth: [birth year]  Transition of Care [facility]) CM/SW Contact:  Joanne Chars, LCSW Phone Number: 10/17/2021, 12:17 PM   Clinical Narrative:   Pt discharging to [facility]  RN call [phone] for report.      Final next level of care: [place] Barriers to Discharge: Barriers Resolved   Patient Goals and CMS Choice        Discharge Placement              Patient chooses bed at:  [facility]) Patient to be transferred to facility by: [facility] Name of family member notified: son [name] in room Patient and family notified of of transfer: 10/17/21  Discharge Plan and Services In-house Referral: Clinical Social Work   Post Acute Care Choice:  (TBD)                               Social Determinants of Health (SDOH) Interventions     Readmission Risk Interventions No flowsheet data found.

## 2021-10-17 NOTE — Progress Notes (Signed)
Daily Progress Note   Patient Name: Erin Beck       Date: 10/17/2021 DOB: 29-Jun-1941  Age: 81 y.o. MRN#: [id number] Attending Physician: Geradine Girt, DO Primary Care Physician: Tower, [name], MD Admit Date: 10/12/2021 Length of Stay: 4 days  Reason for Consultation/Follow-up: Pain control and Terminal Care  HPI/Patient Profile:  Erin Beck is a 81 y.o. female with medical history significant of hypertension, hyperlipidemia, CAD, diabetes mellitus type 2, GERD, and dementia who presents after being found down.  History is obtained from the patient's son who lives at [place] as patient has significantly dementia.  The patient lives at home with her 2 older sons.  The patient has a lift chair in her home, but last night the power went out.  Her son suspects that she tried to go downstairs without the lift chair and fell.  She was found this morning on the floor by one of her sons after coming home from work.  Found to have multiple fractures of her cervical spine.  Poor overall prognosis.  On 2/26 the patient was transitioned to comfort care.  PMT initially consulted for [place], not continued support for pain management and end of life care.  Subjective:   Subjective: Chart Reviewed. Updates received. Patient Assessed. Created space and opportunity for patient and family to explore thoughts and feelings regarding current medical situation.  Today's Discussion: The patient was sleeping and I elected not to wake her due to being on comfort care.  Noted regular respirations in no obvious distress.  I spoke with the nurse she states the patient has been sleeping all day.  No obvious distress, discomfort, or anxiety.  She has been getting her scheduled medications per her MAR.  Review of Systems  Unable to perform ROS: Acuity of condition   Objective:   Vital Signs:  BP 121/75 (BP Location: Right Arm)    Pulse 70    Temp 97.6 F (36.4 C) (Oral)    Resp 18    Ht 5' (1.524  m)    Wt 59.1 kg    SpO2 91%    BMI 25.45 kg/m   Physical Exam: Physical Exam Constitutional:      General: She is sleeping. She is not in acute distress.    Appearance: She is ill-appearing.  HENT:     Head: Normocephalic and atraumatic.  Pulmonary:     Effort: Pulmonary effort is normal. No respiratory distress.    Palliative Assessment/Data: 10%   Assessment & Plan:   Impression: Present on Admission:  Multiple fractures of cervical spine ([place]) secondary to fall on and down steps  Leukocytosis  Dementia (HCC)  HYPERTENSION, BENIGN ESSENTIAL  81 year old female admitted for multiple fractures of the cervical spine secondary to fall, leukocytosis, baseline dementia.  She was transitioned to comfort care yesterday.  Today she appears comfortable and continues on parenteral controlled substances for symptom management.  Currently pending inpatient hospice bed availability for transfer versus in-hospital death.  SUMMARY OF RECOMMENDATIONS   Continue DNR Continue comfort care Comfort medication orders as per below Unrestricted visitation Await placement at [facility] PMT will continue to follow  Symptom Management:  Robinul 0.2 mg IV every 4 hours as needed for excessive secretions Haldol 1 mg IV as needed for agitation Hydrocodone-APAP 5-325 mg p.o. every 6 hours as needed for pain Dilaudid 0.5 mg IV every 6 hours scheduled for pain Dilaudid 0.5 to 1 mg IV every hour as needed  for dyspnea/pain Ativan 0.5 to 1 mg IV every hour as needed anxiety Zofran 4 mg IV every 6 hours as needed nausea/vomiting  Code Status: DNR  Prognosis: < 2 weeks  Discharge Planning: Hospice facility  Discussed with: Medical team, nursing team  Thank you for allowing [place] to participate in the care of Erin Beck PMT will continue to support holistically.  ADDENDUM: I received a message from [facility] liaison.  There is a bed available today  and they are working on getting consent signed to transfer the patient to residential hospice for end-of-life care.  Billing based on MDM: High  Problems Addressed: One acute or chronic illness or injury that poses a threat to life or bodily function  Amount and/or Complexity of Data: Category 3:Discussion of management or test interpretation with external physician/other qualified health care professional/appropriate source (not separately reported)  Risks: Parenteral controlled substances   Walden Field, NP Palliative Medicine Team  Team Phone # [phone] (Nights/Weekends)  04/18/2021, 8:17 AM

## 2021-10-17 NOTE — Progress Notes (Signed)
Called report to tiffany at [facility].

## 2021-10-17 NOTE — Discharge Summary (Signed)
**Note De-Identified Erin Obfuscation** Physician Discharge Summary   Patient: Erin Beck MRN: [id number] DOB: [birth year]  Admit date:     10/12/2021  Discharge date: 10/17/21  Discharge Physician: Geradine Girt   PCP: Abner Greenspan, MD   Recommendations at discharge:   To hospice  Discharge Diagnoses: Principal Problem:   Multiple fractures of cervical spine (Home) secondary to fall on and down steps Active Problems:   HYPERTENSION, BENIGN ESSENTIAL   Uncontrolled type 2 diabetes mellitus with hyperglycemia, without long-term current use of insulin (HCC)   Thrombocytosis   Dementia (HCC)   Leukocytosis   Hyponatremia   Hospice care patient  Resolved Problems:   * No resolved hospital problems. *   Hospital Course: Erin Beck is a 81 y.o. female with medical history significant of hypertension, hyperlipidemia, CAD, diabetes mellitus type 2, GERD, and dementia who presents after being found down.  History was obtained from the patient's son who lives at [facility] as patient has significantly dementia.  The patient lives at home with her 2 older sons.  The patient has a lift chair in her home, but last night the power went out.  Her son suspects that she tried to go downstairs without the lift chair and fell.  She was found this morning on the floor by one of her sons after coming home from work.  Found to have multiple fractures of her cervical spine.  Family met with palliative care and plan is transition to inpatient hospice.  Assessment and Plan: * Multiple fractures of cervical spine (HCC) secondary to fall on and down steps- (present on admission) Patient found to have nondisplaced type II dens fracture, nondisplaced fracture of the right and left posterior arch of C1, and communicated fracture of the right anterior arch CT. neurosurgery had been consulted recommending conservative management with continuation of hard cervical collar for at least 3 months, and outpatient follow-up with them in 1  month along with imaging. Unclear cause of the fall at this time, but thought to be mechanical. -transitioned to comfort care  Hyponatremia Acute.  On admission sodium was noted to be 129.  However, after correcting for hyperglycemia sodium noted to be within normal limits at 139 and therefore pseudohyponatremia. -resolved  Leukocytosis- (present on admission) Initial labs significant for WBC 19.3.  Patient was otherwise noted to be afebrile.  Chest -resolved -transitioned to comfort care  Dementia [facility])- (present on admission) Patient had previously been on Paxil 10 mg daily, but had been refusing to take the medication. -Delirium precautions -Bed alarm on -palliative care consult for GOC: -transitioned to comfort care  Thrombocytosis Acute.  Thought to be likely reactive in nature. -transitioned to comfort care  Uncontrolled type 2 diabetes mellitus with hyperglycemia, without long-term current use of insulin ([place]) On admission patient was found to have blood sugars elevated up to 516.  She previously had been on glipizide and, but had not taken this in several months.   -transitioned to comfort care  HYPERTENSION, BENIGN ESSENTIAL- (present on admission) On admission patient was found to have blood pressures elevated up to 172/80.  She is not on any blood pressure medications due to stopping them several months ago.   -transitioned to comfort care     DISCHARGE MEDICATION: Allergies as of 10/17/2021       Reactions   Naproxen Rash   Trouble breathing   Naproxen Sodium Rash   Trouble breathing   Aspirin    REACTION: burns stomach PT STATES SHE  DOES TOLERATE THE 81 MG ASPIRIN DAILY   Atorvastatin    REACTION: muscle pain   Buspar [buspirone Hcl]    Multiple side eff   Gabapentin Other (See Comments)   Palpitations/ dizziness    Metformin And Related    Mm aches, palpitations, SOB, worse eyesight, loss of balance, sweating, low CBGs   Metoprolol Other (See Comments)    dizziness   Norco [hydrocodone-acetaminophen] Other (See Comments)   Dizzy and problems remembering    Pravastatin    Leg pain   Simvastatin    REACTION: GI side eff and swelling   Sulfa Antibiotics Other (See Comments)   Severe joint pain   Sulfonamide Derivatives    REACTION: reaction not known   Zetia [ezetimibe]    Caused dizziness        Medication List     STOP taking these medications    Accu-Chek FastClix Lancets Misc   acetaminophen 325 MG tablet Commonly known as: TYLENOL   donepezil 10 MG tablet Commonly known as: ARICEPT   glipiZIDE 5 MG tablet Commonly known as: GLUCOTROL   glucose blood test strip Commonly known as: Accu-Chek Guide   hydrochlorothiazide 25 MG tablet Commonly known as: HYDRODIURIL   lisinopril 10 MG tablet Commonly known as: ZESTRIL   Engineering geologist w/Device Kit   PARoxetine 10 MG tablet Commonly known as: Paxil   rosuvastatin 10 MG tablet Commonly known as: CRESTOR       TAKE these medications    glycopyrrolate 0.2 MG/ML injection Commonly known as: ROBINUL Inject 1 mL (0.2 mg total) into the skin every 4 (four) hours as needed (excessive secretions).   HYDROmorphone 1 MG/ML injection Commonly known as: DILAUDID Inject 0.5 mLs (0.5 mg total) into the vein every 6 (six) hours.   HYDROmorphone 1 MG/ML injection Commonly known as: DILAUDID Inject 0.5-1 mLs (0.5-1 mg total) into the vein every hour as needed (Dyspnea/Pain).   LORazepam 2 MG/ML injection Commonly known as: ATIVAN Inject 0.25-0.5 mLs (0.5-1 mg total) into the vein every hour as needed for anxiety, seizure or sedation.   polyvinyl alcohol 1.4 % ophthalmic solution Commonly known as: LIQUIFILM TEARS Place 1 drop into both eyes 4 (four) times daily as needed for dry eyes.         Discharge Exam: Filed Weights   10/12/21 0750 10/13/21 0730  Weight: 66.4 kg 59.1 kg     Condition at discharge:  terminal  The  results of significant diagnostics from this hospitalization (including imaging, microbiology, ancillary and laboratory) are listed below for reference.   Imaging Studies: CT Head Wo Contrast  Result Date: 10/12/2021 CLINICAL DATA:  Facial trauma, status post fall EXAM: CT HEAD WITHOUT CONTRAST CT MAXILLOFACIAL WITHOUT CONTRAST CT CERVICAL SPINE WITHOUT CONTRAST TECHNIQUE: Multidetector CT imaging of the head, cervical spine, and maxillofacial structures were performed using the standard protocol without intravenous contrast. Multiplanar CT image reconstructions of the cervical spine and maxillofacial structures were also generated. RADIATION DOSE REDUCTION: This exam was performed according to the departmental dose-optimization program which includes automated exposure control, adjustment of the mA and/or kV according to patient size and/or use of iterative reconstruction technique. COMPARISON:  06/10/2014 FINDINGS: CT HEAD FINDINGS Brain: No evidence of acute infarction, hemorrhage, extra-axial collection, ventriculomegaly, or mass effect. Generalized cerebral atrophy. Periventricular white matter low attenuation likely secondary to microangiopathy. Vascular: Cerebrovascular atherosclerotic calcifications are noted. Skull: Negative for fracture or focal lesion. Sinuses/Orbits: Visualized portions of the orbits are unremarkable.  Bilateral maxillary sinus mucosal thickening. Visualized portions of the mastoid air cells are unremarkable. Other: Small left frontal hematoma. CT MAXILLOFACIAL FINDINGS Osseous: Minimally displaced left nasal bone fracture. No other fracture or dislocation. Temporomandibular joints are normal. No aggressive osseous lesion. Orbits: Negative. No traumatic or inflammatory finding. Sinuses: Mild bilateral maxillary sinus mucosal thickening. Opacification of the right frontal sinus. Left frontal sinus is not pneumatized. Soft tissues: Soft tissue swelling over the left nasal bone. CT  CERVICAL SPINE FINDINGS Alignment: Anatomic alignment.  No static listhesis. Skull base and vertebrae: Nondisplaced type 2 dens fracture. Nondisplaced fracture of the right and left posterior arch of C1. Comminuted fracture of the right anterior arch of C1. No other fracture or dislocation. No aggressive osseous lesion. Soft tissues and spinal canal: Intraspinal soft tissues are not fully imaged on this examination due to poor soft tissue contrast, but there is no gross soft tissue abnormality. No prevertebral fluid or swelling. No visible canal hematoma. Disc levels: Degenerative disease with disc height loss at C3-4, C4-5, C5-6, C6-7 and to lesser extent C7-T1. Ankylosis of the vertebral bodies and posterior elements at C4-5. Bilateral facet arthropathy throughout the cervical spine. Bilateral uncovertebral degenerative changes at C3-4 with bilateral foraminal stenosis. Bilateral uncovertebral degenerative changes at C4-5 with bilateral foraminal stenosis. Bilateral uncovertebral degenerative changes at C5-6 with bilateral foraminal narrowing. Upper chest: Lung apices are clear. Other: No fluid collection or hematoma. Carotid artery atherosclerosis. IMPRESSION: 1. No acute intracranial pathology. Small left frontal hematoma. 2. Minimally displaced left nasal bone fracture. Soft tissue swelling over the left nasal bone. 3. Nondisplaced type 2 dens fracture. Nondisplaced fracture of the right and left posterior arch of C1. Comminuted fracture of the right anterior arch of C1. 4. Cervical spondylosis. Critical Value/emergent results were called by telephone at the time of interpretation on 10/12/2021 at 8:47 am to provider [facility] , who verbally acknowledged these results. Electronically Signed   By: Kathreen Devoid M.D.   On: 10/12/2021 08:49   CT Cervical Spine Wo Contrast  Result Date: 10/12/2021 CLINICAL DATA:  Facial trauma, status post fall EXAM: CT HEAD WITHOUT CONTRAST CT MAXILLOFACIAL WITHOUT  CONTRAST CT CERVICAL SPINE WITHOUT CONTRAST TECHNIQUE: Multidetector CT imaging of the head, cervical spine, and maxillofacial structures were performed using the standard protocol without intravenous contrast. Multiplanar CT image reconstructions of the cervical spine and maxillofacial structures were also generated. RADIATION DOSE REDUCTION: This exam was performed according to the departmental dose-optimization program which includes automated exposure control, adjustment of the mA and/or kV according to patient size and/or use of iterative reconstruction technique. COMPARISON:  06/10/2014 FINDINGS: CT HEAD FINDINGS Brain: No evidence of acute infarction, hemorrhage, extra-axial collection, ventriculomegaly, or mass effect. Generalized cerebral atrophy. Periventricular white matter low attenuation likely secondary to microangiopathy. Vascular: Cerebrovascular atherosclerotic calcifications are noted. Skull: Negative for fracture or focal lesion. Sinuses/Orbits: Visualized portions of the orbits are unremarkable. Bilateral maxillary sinus mucosal thickening. Visualized portions of the mastoid air cells are unremarkable. Other: Small left frontal hematoma. CT MAXILLOFACIAL FINDINGS Osseous: Minimally displaced left nasal bone fracture. No other fracture or dislocation. Temporomandibular joints are normal. No aggressive osseous lesion. Orbits: Negative. No traumatic or inflammatory finding. Sinuses: Mild bilateral maxillary sinus mucosal thickening. Opacification of the right frontal sinus. Left frontal sinus is not pneumatized. Soft tissues: Soft tissue swelling over the left nasal bone. CT CERVICAL SPINE FINDINGS Alignment: Anatomic alignment.  No static listhesis. Skull base and vertebrae: Nondisplaced type 2 dens fracture. Nondisplaced fracture of the right  and left posterior arch of C1. Comminuted fracture of the right anterior arch of C1. No other fracture or dislocation. No aggressive osseous lesion. Soft  tissues and spinal canal: Intraspinal soft tissues are not fully imaged on this examination due to poor soft tissue contrast, but there is no gross soft tissue abnormality. No prevertebral fluid or swelling. No visible canal hematoma. Disc levels: Degenerative disease with disc height loss at C3-4, C4-5, C5-6, C6-7 and to lesser extent C7-T1. Ankylosis of the vertebral bodies and posterior elements at C4-5. Bilateral facet arthropathy throughout the cervical spine. Bilateral uncovertebral degenerative changes at C3-4 with bilateral foraminal stenosis. Bilateral uncovertebral degenerative changes at C4-5 with bilateral foraminal stenosis. Bilateral uncovertebral degenerative changes at C5-6 with bilateral foraminal narrowing. Upper chest: Lung apices are clear. Other: No fluid collection or hematoma. Carotid artery atherosclerosis. IMPRESSION: 1. No acute intracranial pathology. Small left frontal hematoma. 2. Minimally displaced left nasal bone fracture. Soft tissue swelling over the left nasal bone. 3. Nondisplaced type 2 dens fracture. Nondisplaced fracture of the right and left posterior arch of C1. Comminuted fracture of the right anterior arch of C1. 4. Cervical spondylosis. Critical Value/emergent results were called by telephone at the time of interpretation on 10/12/2021 at 8:47 am to provider [facility] , who verbally acknowledged these results. Electronically Signed   By: Kathreen Devoid M.D.   On: 10/12/2021 08:49   DG Chest Port 1 View  Result Date: 10/12/2021 CLINICAL DATA:  An 81 year old female presents with head injury and leukocytosis. EXAM: PORTABLE CHEST 1 VIEW COMPARISON:  Comparison is made with March 08, 2017. FINDINGS: EKG leads project over the chest. Slight rotation to the RIGHT. Accounting for this cardiomediastinal contours and hilar structures are normal. No lobar consolidation, effusion or visible pneumothorax. On limited assessment there is no acute skeletal process. IMPRESSION: No  acute cardiopulmonary abnormality. Electronically Signed   By: Zetta Bills M.D.   On: 10/12/2021 09:33   CT Maxillofacial WO CM  Result Date: 10/12/2021 CLINICAL DATA:  Facial trauma, status post fall EXAM: CT HEAD WITHOUT CONTRAST CT MAXILLOFACIAL WITHOUT CONTRAST CT CERVICAL SPINE WITHOUT CONTRAST TECHNIQUE: Multidetector CT imaging of the head, cervical spine, and maxillofacial structures were performed using the standard protocol without intravenous contrast. Multiplanar CT image reconstructions of the cervical spine and maxillofacial structures were also generated. RADIATION DOSE REDUCTION: This exam was performed according to the departmental dose-optimization program which includes automated exposure control, adjustment of the mA and/or kV according to patient size and/or use of iterative reconstruction technique. COMPARISON:  06/10/2014 FINDINGS: CT HEAD FINDINGS Brain: No evidence of acute infarction, hemorrhage, extra-axial collection, ventriculomegaly, or mass effect. Generalized cerebral atrophy. Periventricular white matter low attenuation likely secondary to microangiopathy. Vascular: Cerebrovascular atherosclerotic calcifications are noted. Skull: Negative for fracture or focal lesion. Sinuses/Orbits: Visualized portions of the orbits are unremarkable. Bilateral maxillary sinus mucosal thickening. Visualized portions of the mastoid air cells are unremarkable. Other: Small left frontal hematoma. CT MAXILLOFACIAL FINDINGS Osseous: Minimally displaced left nasal bone fracture. No other fracture or dislocation. Temporomandibular joints are normal. No aggressive osseous lesion. Orbits: Negative. No traumatic or inflammatory finding. Sinuses: Mild bilateral maxillary sinus mucosal thickening. Opacification of the right frontal sinus. Left frontal sinus is not pneumatized. Soft tissues: Soft tissue swelling over the left nasal bone. CT CERVICAL SPINE FINDINGS Alignment: Anatomic alignment.  No static  listhesis. Skull base and vertebrae: Nondisplaced type 2 dens fracture. Nondisplaced fracture of the right and left posterior arch of C1. Comminuted fracture of  the right anterior arch of C1. No other fracture or dislocation. No aggressive osseous lesion. Soft tissues and spinal canal: Intraspinal soft tissues are not fully imaged on this examination due to poor soft tissue contrast, but there is no gross soft tissue abnormality. No prevertebral fluid or swelling. No visible canal hematoma. Disc levels: Degenerative disease with disc height loss at C3-4, C4-5, C5-6, C6-7 and to lesser extent C7-T1. Ankylosis of the vertebral bodies and posterior elements at C4-5. Bilateral facet arthropathy throughout the cervical spine. Bilateral uncovertebral degenerative changes at C3-4 with bilateral foraminal stenosis. Bilateral uncovertebral degenerative changes at C4-5 with bilateral foraminal stenosis. Bilateral uncovertebral degenerative changes at C5-6 with bilateral foraminal narrowing. Upper chest: Lung apices are clear. Other: No fluid collection or hematoma. Carotid artery atherosclerosis. IMPRESSION: 1. No acute intracranial pathology. Small left frontal hematoma. 2. Minimally displaced left nasal bone fracture. Soft tissue swelling over the left nasal bone. 3. Nondisplaced type 2 dens fracture. Nondisplaced fracture of the right and left posterior arch of C1. Comminuted fracture of the right anterior arch of C1. 4. Cervical spondylosis. Critical Value/emergent results were called by telephone at the time of interpretation on 10/12/2021 at 8:47 am to provider [facility] , who verbally acknowledged these results. Electronically Signed   By: Kathreen Devoid M.D.   On: 10/12/2021 08:49    Microbiology: Results for orders placed or performed during the hospital encounter of 10/12/21  Resp Panel by RT-PCR (Flu A&B, Covid) Nasopharyngeal Swab     Status: None   Collection Time: 10/12/21  9:12 AM   Specimen:  Nasopharyngeal Swab; Nasopharyngeal(NP) swabs in vial transport medium  Result Value Ref Range Status   SARS Coronavirus 2 by RT PCR NEGATIVE NEGATIVE Final    Comment: (NOTE) SARS-CoV-2 target nucleic acids are NOT DETECTED.  The SARS-CoV-2 RNA is generally detectable in upper respiratory specimens during the acute phase of infection. The lowest concentration of SARS-CoV-2 viral copies this assay can detect is 138 copies/mL. A negative result does not preclude SARS-Cov-2 infection and should not be used as the sole basis for treatment or other patient management decisions. A negative result may occur with  improper specimen collection/handling, submission of specimen other than nasopharyngeal swab, presence of viral mutation(s) within the areas targeted by this assay, and inadequate number of viral copies(<138 copies/mL). A negative result must be combined with clinical observations, patient history, and epidemiological information. The expected result is Negative.  Fact Sheet for Patients:  EntrepreneurPulse.com.au  Fact Sheet for Healthcare Providers:  IncredibleEmployment.be  This test is no t yet approved or cleared by the [place] FDA and  has been authorized for detection and/or diagnosis of SARS-CoV-2 by FDA under an Emergency Use Authorization (EUA). This EUA will remain  in effect (meaning this test can be used) for the duration of the COVID-19 declaration under Section 564(b)(1) of the Act, 21 U.S.C.section 360bbb-3(b)(1), unless the authorization is terminated  or revoked sooner.       Influenza A by PCR NEGATIVE NEGATIVE Final   Influenza B by PCR NEGATIVE NEGATIVE Final    Comment: (NOTE) The Xpert Xpress SARS-CoV-2/FLU/RSV plus assay is intended as an aid in the diagnosis of influenza from Nasopharyngeal swab specimens and should not be used as a sole basis for treatment. Nasal washings and aspirates are unacceptable for  Xpert Xpress SARS-CoV-2/FLU/RSV testing.  Fact Sheet for Patients: EntrepreneurPulse.com.au  Fact Sheet for Healthcare Providers: IncredibleEmployment.be  This test is not yet approved or cleared by the [place]  States FDA and has been authorized for detection and/or diagnosis of SARS-CoV-2 by FDA under an Emergency Use Authorization (EUA). This EUA will remain in effect (meaning this test can be used) for the duration of the COVID-19 declaration under Section 564(b)(1) of the Act, 21 U.S.C. section 360bbb-3(b)(1), unless the authorization is terminated or revoked.  Performed at [facility], [place] [address], [place], [place] [address]     Labs: CBC: Recent Labs  Lab 10/12/21 0745 10/13/21 0758  WBC 19.3* 9.7  NEUTROABS 17.4*  --   HGB 15.3* 14.0  HCT 44.8 41.8  MCV 92.4 93.7  PLT 595* 242*   Basic Metabolic Panel: Recent Labs  Lab 10/12/21 0745 10/13/21 0758  NA 129* 135  K 4.2 3.5  CL 94* 102  CO2 24 25  GLUCOSE 517* 275*  BUN 16 12  CREATININE 0.80 0.63  CALCIUM 9.2 8.5*   Liver Function Tests: Recent Labs  Lab 10/12/21 0745  AST 22  ALT 17  ALKPHOS 67  BILITOT 0.7  PROT 6.7  ALBUMIN 3.9   CBG: Recent Labs  Lab 10/13/21 1549 10/13/21 2213 10/14/21 0744 10/14/21 1113 10/14/21 1556  GLUCAP 145* 111* 176* 216* 129*    Discharge time spent: greater than 30 minutes.  Signed: Geradine Girt, DO [facility] 10/17/2021

## 2021-10-17 NOTE — Plan of Care (Signed)
°  Problem: Clinical Measurements: °Goal: Ability to maintain clinical measurements within normal limits will improve °Outcome: Progressing °  °Problem: Activity: °Goal: Risk for activity intolerance will decrease °Outcome: Not Progressing °  °Problem: Nutrition: °Goal: Adequate nutrition will be maintained °Outcome: Not Progressing °  °

## 2021-11-18 DEATH — deceased

## 2022-04-02 IMAGING — CT CT MAXILLOFACIAL W/O CM
3 series · 14 of 47 positions shown, 16 images · non-contrast
Comparison: 06/10/2014

CLINICAL DATA: Facial trauma, status post fall



[Series 3: facialbone 2.0 st · axial · 0.33mm/px · z∈[-278,-152]mm · 8 of 73 slices shown, 10 images]
[im 5/73  brain]
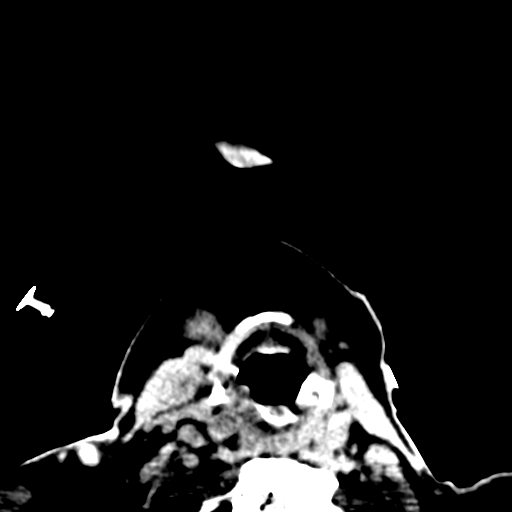
[im 5/73  bone]
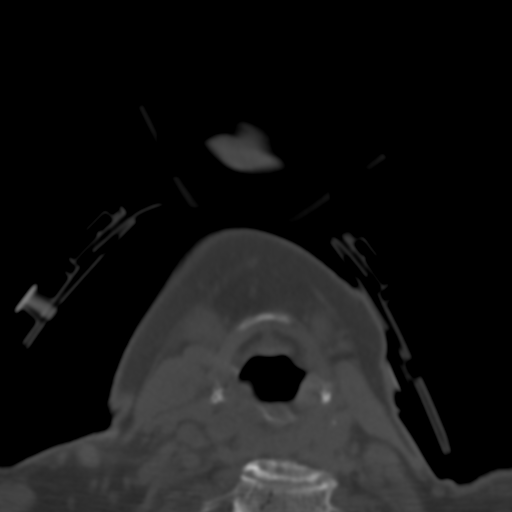
[im 15/73  bone]
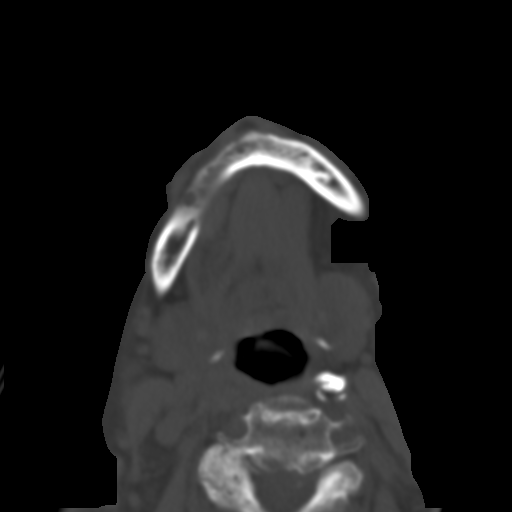
[im 23/73  bone]
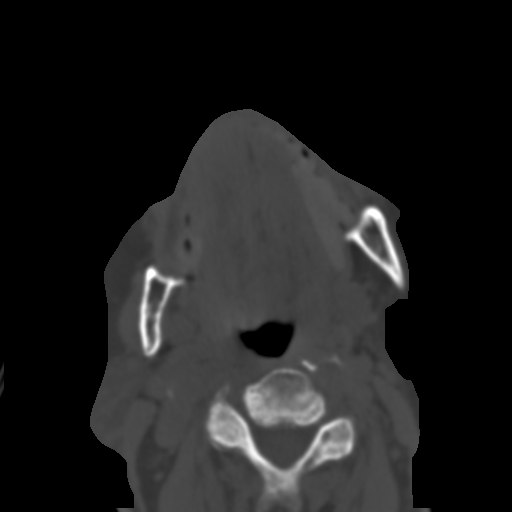
[im 33/73  bone]
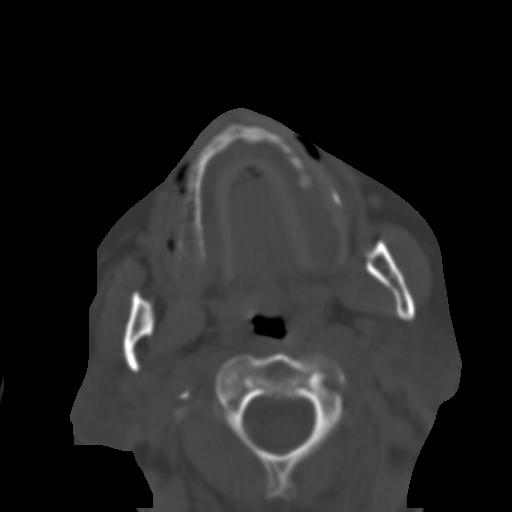
[im 40/73  brain]
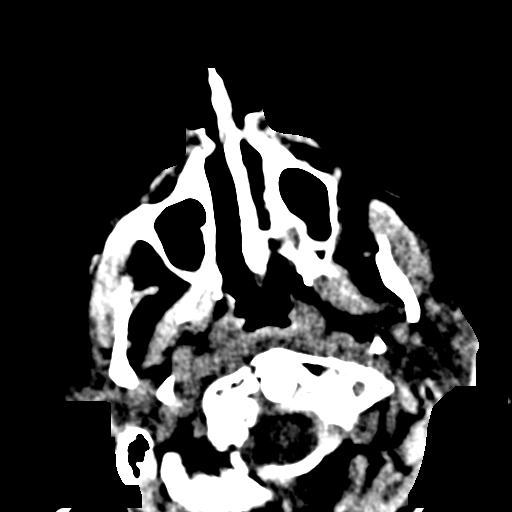
[im 40/73  bone]
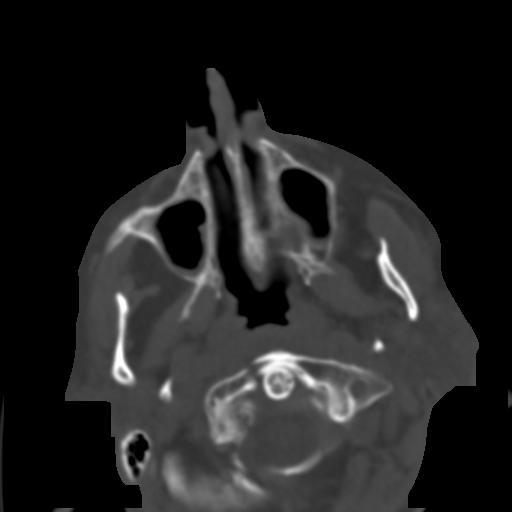
[im 50/73  bone]
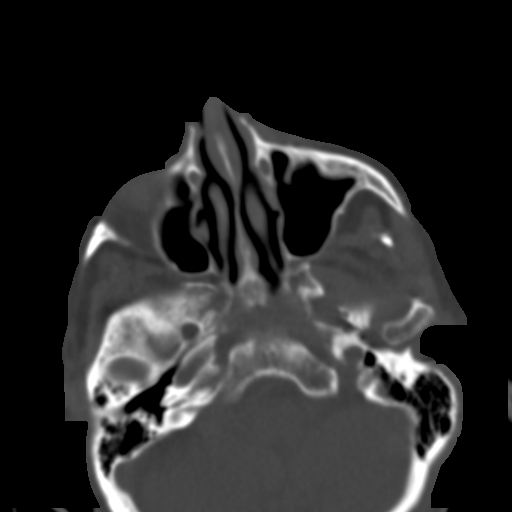
[im 58/73  bone]
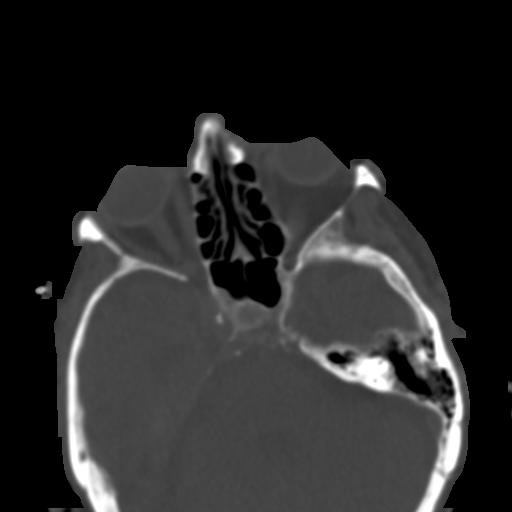
[im 68/73  bone]
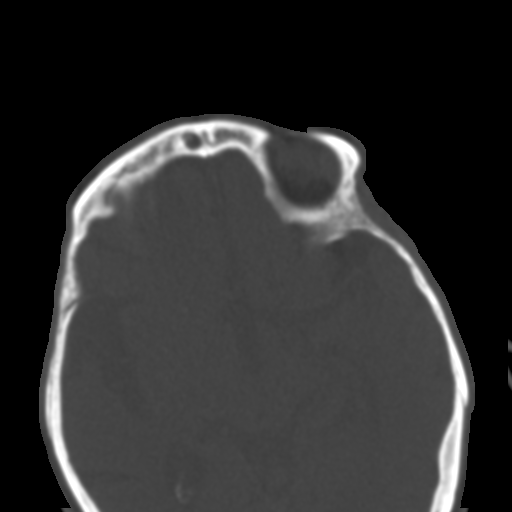

[Series 8: facialbone 2.0 cor st · coronal · 0.31mm/px · 3 of 74 slices shown]
[im 25/74  bone]
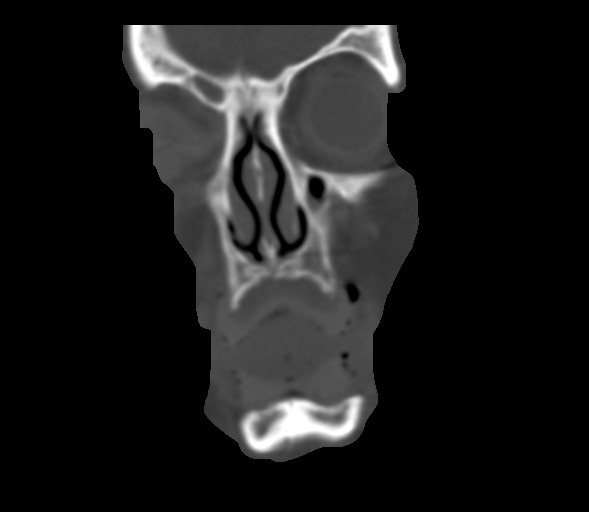
[im 33/74  bone]
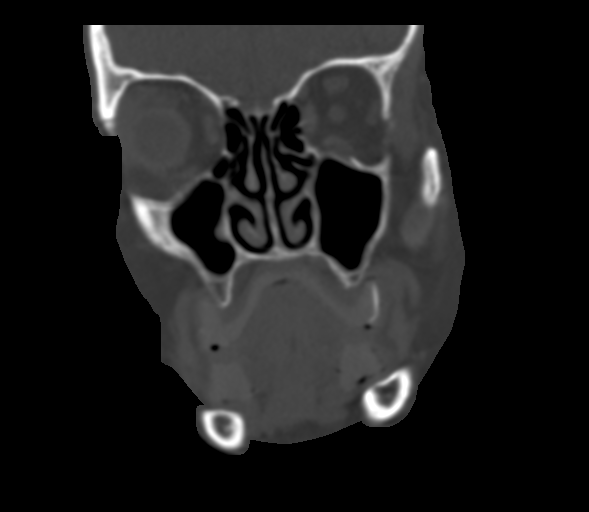
[im 41/74  bone]
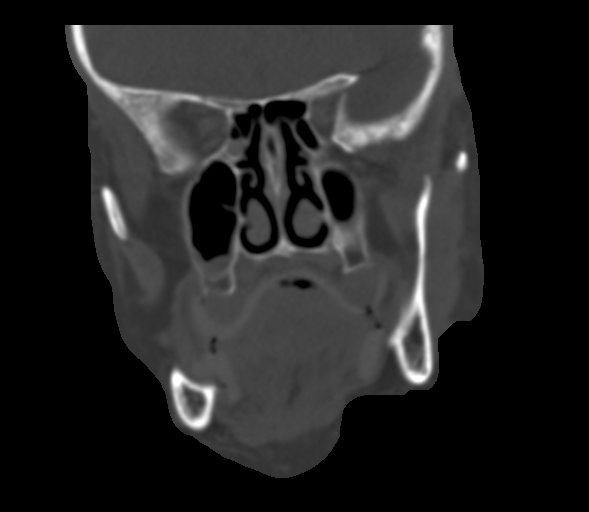

[Series 10: facialbone 2.0 sag st · sagittal · 0.29mm/px · 3 of 88 slices shown]
[im 30/88  bone]
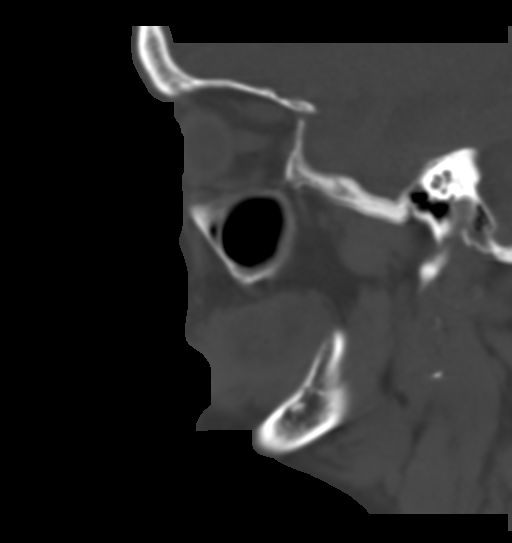
[im 44/88  bone]
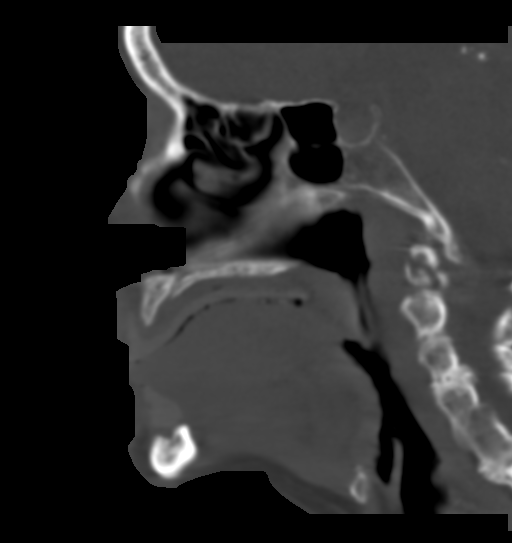
[im 59/88  bone]
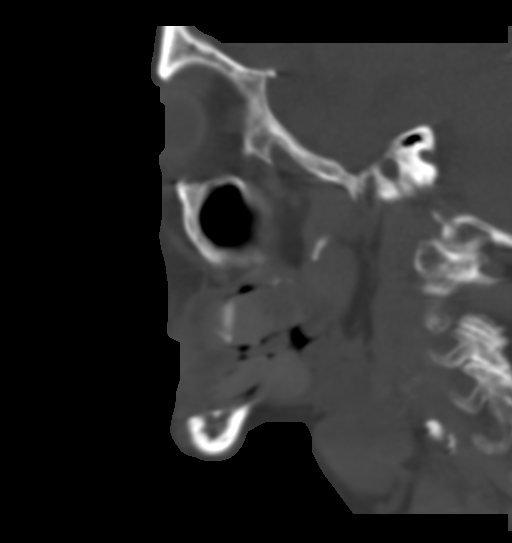

[14 of 47 positions shown; findings below may reference images not displayed]

FINDINGS: CT HEAD FINDINGS

Brain: No evidence of acute infarction, hemorrhage, extra-axial
collection, ventriculomegaly, or mass effect. Generalized cerebral
atrophy. Periventricular white matter low attenuation likely
secondary to microangiopathy.

Vascular: Cerebrovascular atherosclerotic calcifications are noted.

Skull: Negative for fracture or focal lesion.

Sinuses/Orbits: Visualized portions of the orbits are unremarkable.
Bilateral maxillary sinus mucosal thickening. Visualized portions of
the mastoid air cells are unremarkable.

Other: Small left frontal hematoma.

CT MAXILLOFACIAL FINDINGS

Osseous: Minimally displaced left nasal bone fracture. No other
fracture or dislocation. Temporomandibular joints are normal. No
aggressive osseous lesion.

Orbits: Negative. No traumatic or inflammatory finding.

Sinuses: Mild bilateral maxillary sinus mucosal thickening.
Opacification of the right frontal sinus. Left frontal sinus is not
pneumatized.

Soft tissues: Soft tissue swelling over the left nasal bone.

CT CERVICAL SPINE FINDINGS

Alignment: Anatomic alignment.  No static listhesis.

Skull base and vertebrae: Nondisplaced type 2 dens fracture.
Nondisplaced fracture of the right and left posterior arch of C1.
Comminuted fracture of the right anterior arch of C1. No other
fracture or dislocation. No aggressive osseous lesion.

Soft tissues and spinal canal: Intraspinal soft tissues are not
fully imaged on this examination due to poor soft tissue contrast,
but there is no gross soft tissue abnormality. No prevertebral fluid
or swelling. No visible canal hematoma.

Disc levels: Degenerative disease with disc height loss at C3-4,
C4-5, C5-6, C6-7 and to lesser extent C7-T1. Ankylosis of the
vertebral bodies and posterior elements at C4-5. Bilateral facet
arthropathy throughout the cervical spine. Bilateral uncovertebral
degenerative changes at C3-4 with bilateral foraminal stenosis.
Bilateral uncovertebral degenerative changes at C4-5 with bilateral
foraminal stenosis. Bilateral uncovertebral degenerative changes at
C5-6 with bilateral foraminal narrowing.

Upper chest: Lung apices are clear.

Other: No fluid collection or hematoma. Carotid artery
atherosclerosis.
IMPRESSION: 1. No acute intracranial pathology. Small left frontal hematoma.
2. Minimally displaced left nasal bone fracture. Soft tissue
swelling over the left nasal bone.
3. Nondisplaced type 2 dens fracture. Nondisplaced fracture of the
right and left posterior arch of C1. Comminuted fracture of the
right anterior arch of C1.
4. Cervical spondylosis.

Critical Value/emergent results were called by telephone at the time
of interpretation on 10/12/2021 at [DATE] to provider EXAVIER
MAZEAU , who verbally acknowledged these results.

## 2022-04-02 IMAGING — CT CT HEAD W/O CM
3 of 4 series · 13 of 47 positions shown, 15 images · non-contrast
Comparison: 06/10/2014

CLINICAL DATA: Facial trauma, status post fall



[Series 3: head without · axial · non-contrast · 0.39mm/px · z∈[-180,-60]mm · 7 of 32 slices shown, 9 images]
[im 4/32  brain]
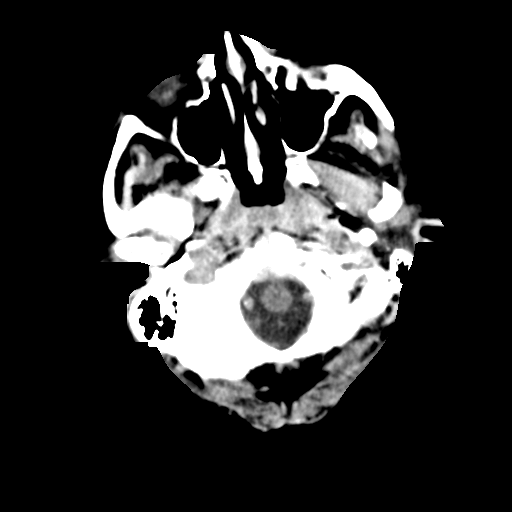
[im 4/32  bone]
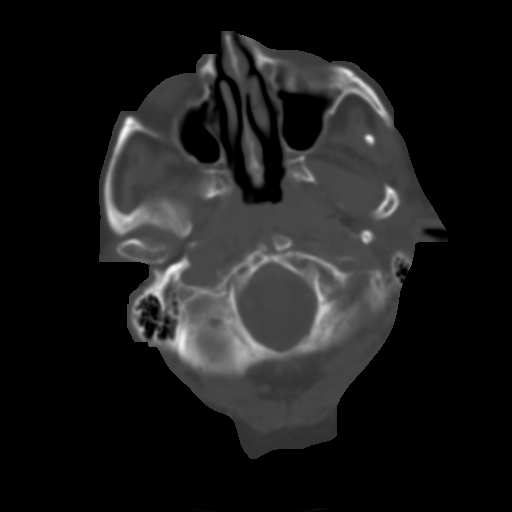
[im 8/32  brain]
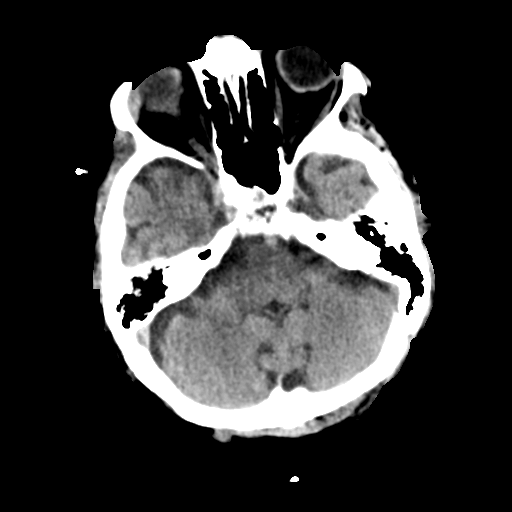
[im 12/32  brain]
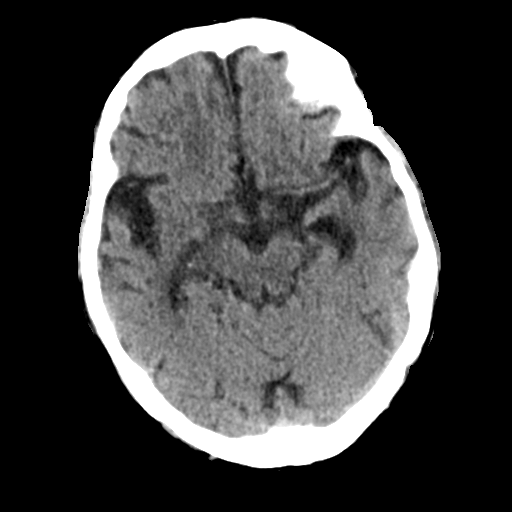
[im 16/32  brain]
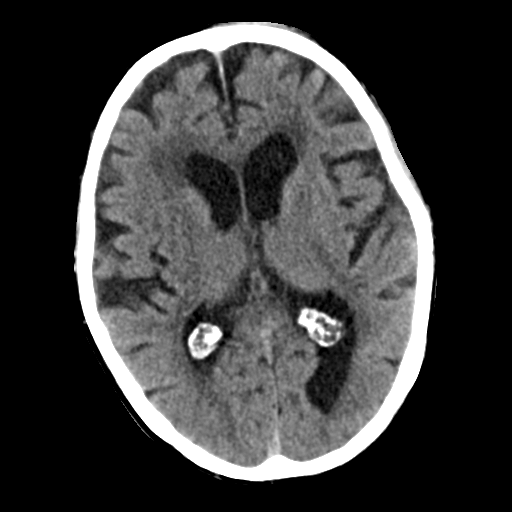
[im 20/32  brain]
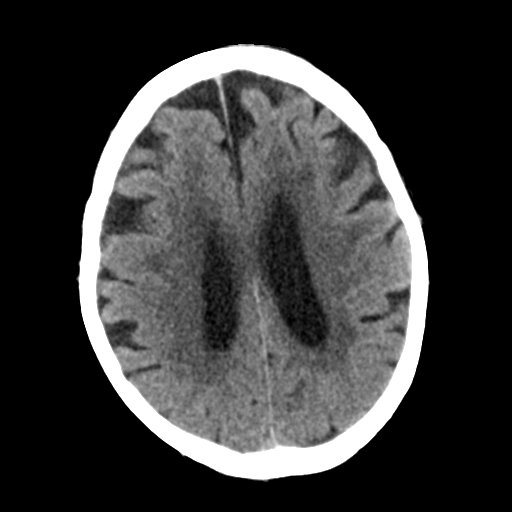
[im 20/32  bone]
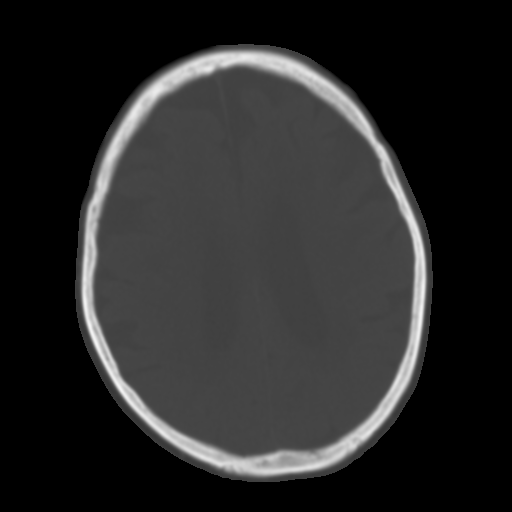
[im 24/32  brain]
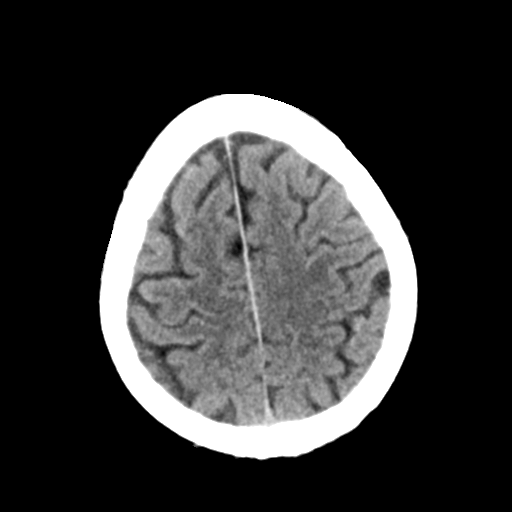
[im 28/32  brain]
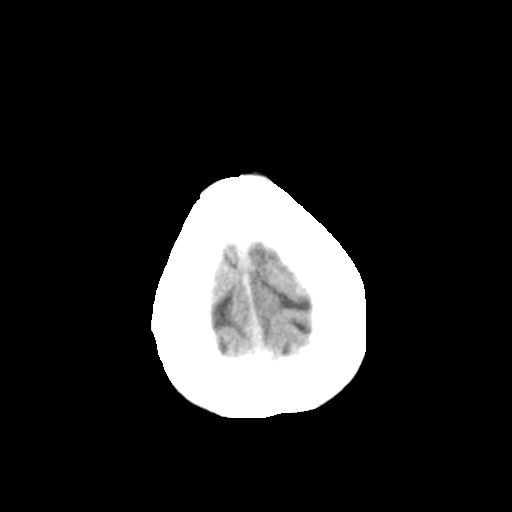

[Series 5: head without cor · coronal · non-contrast · 0.31mm/px · 3 of 68 slices shown]
[im 23/68  brain]
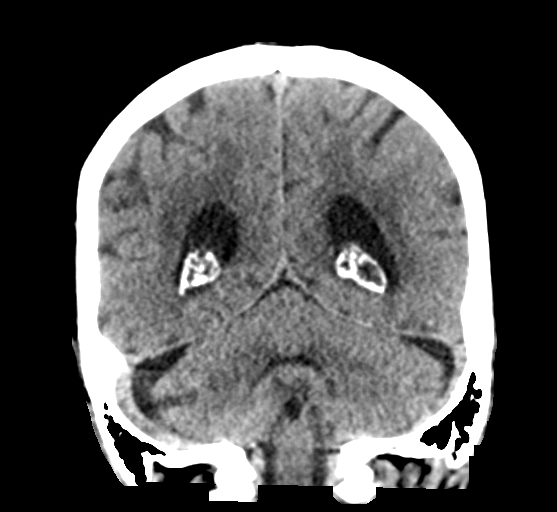
[im 30/68  brain]
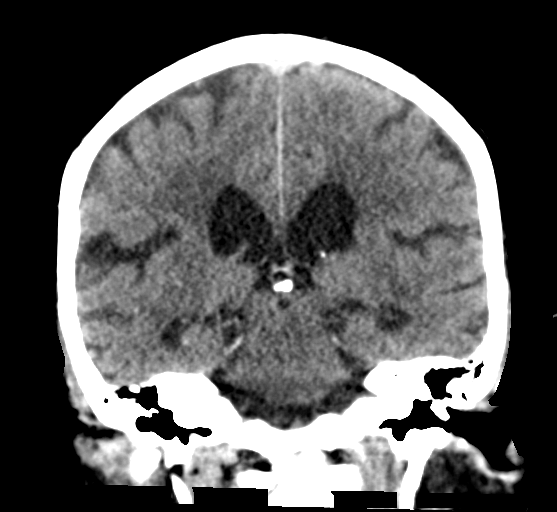
[im 38/68  brain]
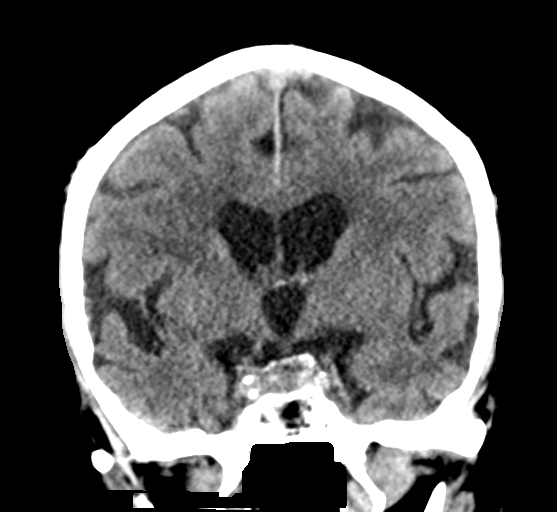

[Series 6: head without sag · sagittal · non-contrast · 0.31mm/px · 3 of 57 slices shown]
[im 19/57  brain]
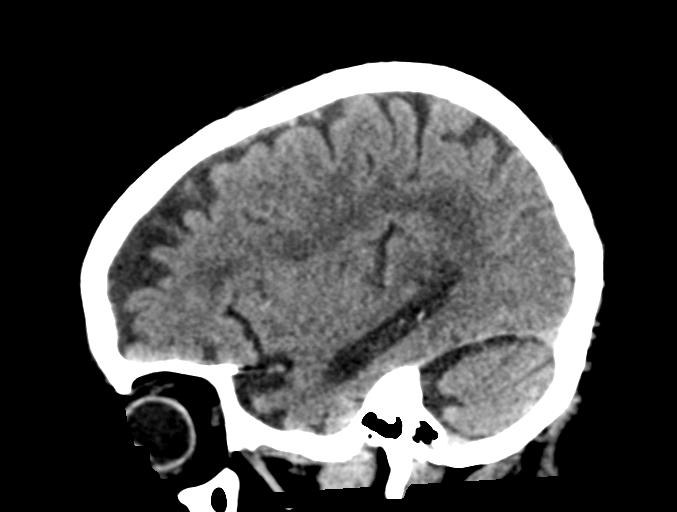
[im 29/57  brain]
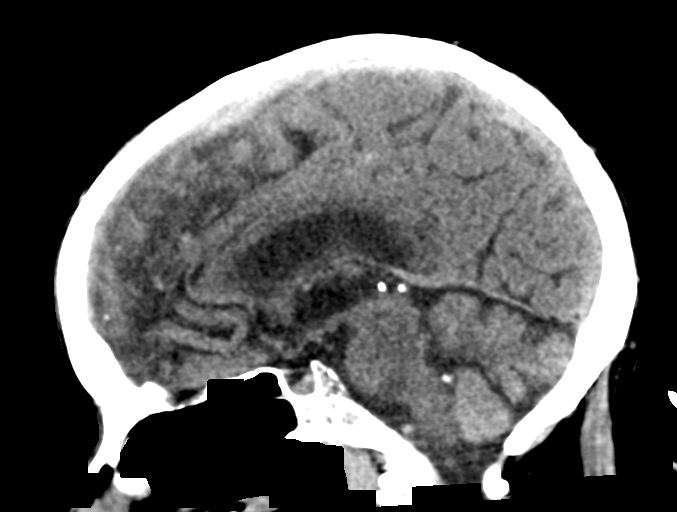
[im 38/57  brain]
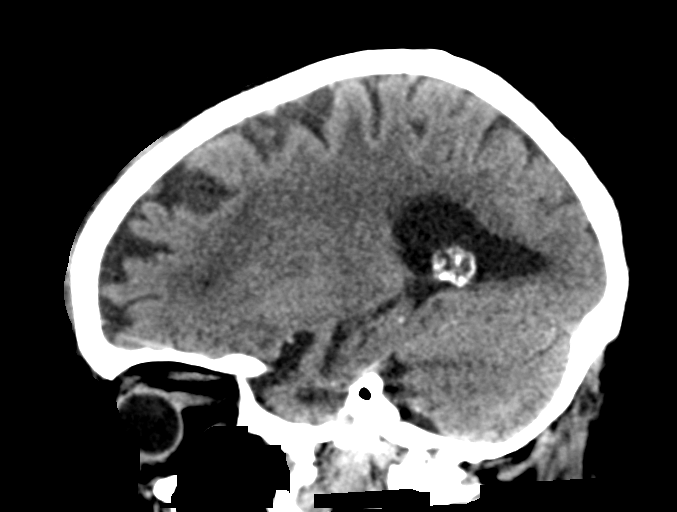

[13 of 47 positions shown; findings below may reference images not displayed]

FINDINGS: CT HEAD FINDINGS

Brain: No evidence of acute infarction, hemorrhage, extra-axial
collection, ventriculomegaly, or mass effect. Generalized cerebral
atrophy. Periventricular white matter low attenuation likely
secondary to microangiopathy.

Vascular: Cerebrovascular atherosclerotic calcifications are noted.

Skull: Negative for fracture or focal lesion.

Sinuses/Orbits: Visualized portions of the orbits are unremarkable.
Bilateral maxillary sinus mucosal thickening. Visualized portions of
the mastoid air cells are unremarkable.

Other: Small left frontal hematoma.

CT MAXILLOFACIAL FINDINGS

Osseous: Minimally displaced left nasal bone fracture. No other
fracture or dislocation. Temporomandibular joints are normal. No
aggressive osseous lesion.

Orbits: Negative. No traumatic or inflammatory finding.

Sinuses: Mild bilateral maxillary sinus mucosal thickening.
Opacification of the right frontal sinus. Left frontal sinus is not
pneumatized.

Soft tissues: Soft tissue swelling over the left nasal bone.

CT CERVICAL SPINE FINDINGS

Alignment: Anatomic alignment.  No static listhesis.

Skull base and vertebrae: Nondisplaced type 2 dens fracture.
Nondisplaced fracture of the right and left posterior arch of C1.
Comminuted fracture of the right anterior arch of C1. No other
fracture or dislocation. No aggressive osseous lesion.

Soft tissues and spinal canal: Intraspinal soft tissues are not
fully imaged on this examination due to poor soft tissue contrast,
but there is no gross soft tissue abnormality. No prevertebral fluid
or swelling. No visible canal hematoma.

Disc levels: Degenerative disease with disc height loss at C3-4,
C4-5, C5-6, C6-7 and to lesser extent C7-T1. Ankylosis of the
vertebral bodies and posterior elements at C4-5. Bilateral facet
arthropathy throughout the cervical spine. Bilateral uncovertebral
degenerative changes at C3-4 with bilateral foraminal stenosis.
Bilateral uncovertebral degenerative changes at C4-5 with bilateral
foraminal stenosis. Bilateral uncovertebral degenerative changes at
C5-6 with bilateral foraminal narrowing.

Upper chest: Lung apices are clear.

Other: No fluid collection or hematoma. Carotid artery
atherosclerosis.
IMPRESSION: 1. No acute intracranial pathology. Small left frontal hematoma.
2. Minimally displaced left nasal bone fracture. Soft tissue
swelling over the left nasal bone.
3. Nondisplaced type 2 dens fracture. Nondisplaced fracture of the
right and left posterior arch of C1. Comminuted fracture of the
right anterior arch of C1.
4. Cervical spondylosis.

Critical Value/emergent results were called by telephone at the time
of interpretation on 10/12/2021 at [DATE] to provider EXAVIER
MAZEAU , who verbally acknowledged these results.

## 2023-04-10 ENCOUNTER — Telehealth: Payer: Self-pay | Admitting: Family Medicine

## 2023-04-10 NOTE — Telephone Encounter (Signed)
Called patient to schedule next available cpe, no answer left Vm.
